# Patient Record
Sex: Male | Born: 1955 | Race: White | Hispanic: No | Smoking: Current every day smoker
Health system: Southern US, Community
[De-identification: ages and names within clinical notes are randomized; demographics above are authoritative.]

## PROBLEM LIST (undated history)

## (undated) DIAGNOSIS — I509 Heart failure, unspecified: Secondary | ICD-10-CM

## (undated) DIAGNOSIS — C801 Malignant (primary) neoplasm, unspecified: Secondary | ICD-10-CM

## (undated) DIAGNOSIS — M1990 Unspecified osteoarthritis, unspecified site: Secondary | ICD-10-CM

## (undated) DIAGNOSIS — F1410 Cocaine abuse, uncomplicated: Secondary | ICD-10-CM

## (undated) DIAGNOSIS — R630 Anorexia: Secondary | ICD-10-CM

## (undated) DIAGNOSIS — K7460 Unspecified cirrhosis of liver: Secondary | ICD-10-CM

## (undated) DIAGNOSIS — F1010 Alcohol abuse, uncomplicated: Secondary | ICD-10-CM

## (undated) DIAGNOSIS — R51 Headache: Secondary | ICD-10-CM

## (undated) DIAGNOSIS — Z720 Tobacco use: Secondary | ICD-10-CM

## (undated) DIAGNOSIS — F419 Anxiety disorder, unspecified: Secondary | ICD-10-CM

## (undated) DIAGNOSIS — J449 Chronic obstructive pulmonary disease, unspecified: Secondary | ICD-10-CM

## (undated) DIAGNOSIS — Z87898 Personal history of other specified conditions: Secondary | ICD-10-CM

## (undated) DIAGNOSIS — K759 Inflammatory liver disease, unspecified: Secondary | ICD-10-CM

## (undated) DIAGNOSIS — F1290 Cannabis use, unspecified, uncomplicated: Secondary | ICD-10-CM

## (undated) DIAGNOSIS — I2510 Atherosclerotic heart disease of native coronary artery without angina pectoris: Secondary | ICD-10-CM

## (undated) DIAGNOSIS — I219 Acute myocardial infarction, unspecified: Secondary | ICD-10-CM

## (undated) DIAGNOSIS — R296 Repeated falls: Secondary | ICD-10-CM

## (undated) DIAGNOSIS — J189 Pneumonia, unspecified organism: Secondary | ICD-10-CM

## (undated) DIAGNOSIS — IMO0001 Reserved for inherently not codable concepts without codable children: Secondary | ICD-10-CM

## (undated) DIAGNOSIS — R202 Paresthesia of skin: Secondary | ICD-10-CM

## (undated) DIAGNOSIS — R200 Anesthesia of skin: Secondary | ICD-10-CM

## (undated) DIAGNOSIS — H9319 Tinnitus, unspecified ear: Secondary | ICD-10-CM

## (undated) DIAGNOSIS — K219 Gastro-esophageal reflux disease without esophagitis: Secondary | ICD-10-CM

## (undated) DIAGNOSIS — R519 Headache, unspecified: Secondary | ICD-10-CM

## (undated) DIAGNOSIS — G629 Polyneuropathy, unspecified: Secondary | ICD-10-CM

## (undated) HISTORY — PX: CORONARY ANGIOPLASTY: SHX604

## (undated) HISTORY — PX: CARDIAC SURGERY: SHX584

---

## 2003-11-22 ENCOUNTER — Emergency Department: Payer: Self-pay | Admitting: Unknown Physician Specialty

## 2003-11-22 ENCOUNTER — Other Ambulatory Visit: Payer: Self-pay

## 2009-07-23 ENCOUNTER — Ambulatory Visit: Payer: Self-pay | Admitting: Cardiovascular Disease

## 2009-07-23 ENCOUNTER — Inpatient Hospital Stay: Payer: Self-pay | Admitting: Internal Medicine

## 2011-03-11 ENCOUNTER — Emergency Department: Payer: Self-pay | Admitting: *Deleted

## 2011-03-11 LAB — COMPREHENSIVE METABOLIC PANEL
Albumin: 3.5 g/dL (ref 3.4–5.0)
Anion Gap: 12 (ref 7–16)
BUN: 6 mg/dL — ABNORMAL LOW (ref 7–18)
Bilirubin,Total: 0.7 mg/dL (ref 0.2–1.0)
Chloride: 102 mmol/L (ref 98–107)
Creatinine: 0.68 mg/dL (ref 0.60–1.30)
EGFR (African American): >60
EGFR (Non-African Amer.): >60
Glucose: 123 mg/dL — ABNORMAL HIGH (ref 65–99)
Osmolality: 275 (ref 275–301)
Potassium: 3.5 mmol/L (ref 3.5–5.1)
SGOT(AST): 363 U/L — ABNORMAL HIGH (ref 15–37)
Sodium: 138 mmol/L (ref 136–145)
Total Protein: 8.9 g/dL — ABNORMAL HIGH (ref 6.4–8.2)

## 2011-03-11 LAB — ETHANOL
Ethanol %: 0.228 % — ABNORMAL HIGH (ref 0.000–0.080)
Ethanol: 228 mg/dL

## 2011-03-11 LAB — CBC
HCT: 45.3 % (ref 40.0–52.0)
MCV: 103 fL — ABNORMAL HIGH (ref 80–100)
RBC: 4.4 10*6/uL (ref 4.40–5.90)
RDW: 13.6 % (ref 11.5–14.5)
WBC: 7 10*3/uL (ref 3.8–10.6)

## 2012-06-14 ENCOUNTER — Emergency Department: Payer: Self-pay | Admitting: Emergency Medicine

## 2012-06-14 LAB — ETHANOL: Ethanol: <3 mg/dL

## 2012-06-14 LAB — URINALYSIS, COMPLETE
Bacteria: NONE SEEN
Blood: NEGATIVE
Glucose,UR: NEGATIVE mg/dL (ref 0–75)
Leukocyte Esterase: NEGATIVE
Ph: 7 (ref 4.5–8.0)
RBC,UR: NONE SEEN /HPF (ref 0–5)
Specific Gravity: 1.01 (ref 1.003–1.030)
Squamous Epithelial: <1
WBC UR: <1 /HPF (ref 0–5)

## 2012-06-14 LAB — COMPREHENSIVE METABOLIC PANEL
Alkaline Phosphatase: 104 U/L (ref 50–136)
Anion Gap: 7 (ref 7–16)
BUN: 9 mg/dL (ref 7–18)
Calcium, Total: 9.2 mg/dL (ref 8.5–10.1)
Chloride: 101 mmol/L (ref 98–107)
EGFR (African American): >60
EGFR (Non-African Amer.): >60
Osmolality: 269 (ref 275–301)
Potassium: 3.5 mmol/L (ref 3.5–5.1)
SGOT(AST): 142 U/L — ABNORMAL HIGH (ref 15–37)
SGPT (ALT): 135 U/L — ABNORMAL HIGH (ref 12–78)
Sodium: 135 mmol/L — ABNORMAL LOW (ref 136–145)
Total Protein: 8.6 g/dL — ABNORMAL HIGH (ref 6.4–8.2)

## 2012-06-14 LAB — CK TOTAL AND CKMB (NOT AT ARMC)
CK, Total: 58 U/L (ref 35–232)
CK-MB: 0.5 ng/mL (ref 0.5–3.6)

## 2012-06-14 LAB — DRUG SCREEN, URINE
Cannabinoid 50 Ng, Ur ~~LOC~~: POSITIVE (ref ?–50)
Cocaine Metabolite,Ur ~~LOC~~: NEGATIVE (ref ?–300)
Phencyclidine (PCP) Ur S: NEGATIVE (ref ?–25)

## 2012-06-14 LAB — CBC
HCT: 44.5 % (ref 40.0–52.0)
MCH: 33.9 pg (ref 26.0–34.0)
MCHC: 35.6 g/dL (ref 32.0–36.0)
MCV: 95 fL (ref 80–100)
RDW: 15.6 % — ABNORMAL HIGH (ref 11.5–14.5)
WBC: 7.7 10*3/uL (ref 3.8–10.6)

## 2012-09-02 ENCOUNTER — Inpatient Hospital Stay: Payer: Self-pay | Admitting: Psychiatry

## 2012-09-02 LAB — DRUG SCREEN, URINE
Barbiturates, Ur Screen: NEGATIVE (ref ?–200)
Benzodiazepine, Ur Scrn: POSITIVE (ref ?–200)
Cocaine Metabolite,Ur ~~LOC~~: NEGATIVE (ref ?–300)
MDMA (Ecstasy)Ur Screen: NEGATIVE (ref ?–500)
Methadone, Ur Screen: NEGATIVE (ref ?–300)
Phencyclidine (PCP) Ur S: NEGATIVE (ref ?–25)
Tricyclic, Ur Screen: NEGATIVE (ref ?–1000)

## 2012-09-02 LAB — URINALYSIS, COMPLETE
Bilirubin,UR: NEGATIVE
Blood: NEGATIVE
Glucose,UR: NEGATIVE mg/dL (ref 0–75)
Leukocyte Esterase: NEGATIVE
Nitrite: NEGATIVE
Ph: 6 (ref 4.5–8.0)
Protein: NEGATIVE
RBC,UR: NONE SEEN /HPF (ref 0–5)
Squamous Epithelial: <1
WBC UR: NONE SEEN /HPF (ref 0–5)

## 2012-09-02 LAB — CBC
HCT: 37.9 % — ABNORMAL LOW (ref 40.0–52.0)
HGB: 13.5 g/dL (ref 13.0–18.0)
MCHC: 35.6 g/dL (ref 32.0–36.0)
MCV: 94 fL (ref 80–100)
Platelet: 105 10*3/uL — ABNORMAL LOW (ref 150–440)
RBC: 4.04 10*6/uL — ABNORMAL LOW (ref 4.40–5.90)
RDW: 14.6 % — ABNORMAL HIGH (ref 11.5–14.5)
WBC: 3.6 10*3/uL — ABNORMAL LOW (ref 3.8–10.6)

## 2012-09-02 LAB — COMPREHENSIVE METABOLIC PANEL
Anion Gap: 3 — ABNORMAL LOW (ref 7–16)
BUN: 9 mg/dL (ref 7–18)
Bilirubin,Total: 0.4 mg/dL (ref 0.2–1.0)
Chloride: 97 mmol/L — ABNORMAL LOW (ref 98–107)
Creatinine: 0.88 mg/dL (ref 0.60–1.30)
EGFR (African American): >60
EGFR (Non-African Amer.): >60
Glucose: 104 mg/dL — ABNORMAL HIGH (ref 65–99)
Osmolality: 256 (ref 275–301)
SGPT (ALT): 138 U/L — ABNORMAL HIGH (ref 12–78)
Sodium: 128 mmol/L — ABNORMAL LOW (ref 136–145)
Total Protein: 8.2 g/dL (ref 6.4–8.2)

## 2012-09-02 LAB — ETHANOL: Ethanol: 61 mg/dL

## 2012-09-02 LAB — ACETAMINOPHEN LEVEL: Acetaminophen: <2 ug/mL

## 2012-09-02 LAB — TSH: Thyroid Stimulating Horm: 1.72 u[IU]/mL

## 2012-09-02 LAB — MAGNESIUM: Magnesium: 1.6 mg/dL — ABNORMAL LOW

## 2012-09-03 LAB — BEHAVIORAL MEDICINE 1 PANEL
Albumin: 3.4 g/dL (ref 3.4–5.0)
Alkaline Phosphatase: 104 U/L (ref 50–136)
Anion Gap: 4 — ABNORMAL LOW (ref 7–16)
BUN: 12 mg/dL (ref 7–18)
Basophil #: 0 10*3/uL (ref 0.0–0.1)
Basophil %: 0.7 %
Bilirubin,Total: 0.6 mg/dL (ref 0.2–1.0)
Calcium, Total: 9.2 mg/dL (ref 8.5–10.1)
Chloride: 102 mmol/L (ref 98–107)
Co2: 30 mmol/L (ref 21–32)
Creatinine: 0.98 mg/dL (ref 0.60–1.30)
EGFR (African American): >60
EGFR (Non-African Amer.): >60
Eosinophil #: 0.2 10*3/uL (ref 0.0–0.7)
Eosinophil %: 5.3 %
Glucose: 101 mg/dL — ABNORMAL HIGH (ref 65–99)
HCT: 40.3 % (ref 40.0–52.0)
HGB: 14.1 g/dL (ref 13.0–18.0)
Lymphocyte #: 1.3 10*3/uL (ref 1.0–3.6)
Lymphocyte %: 36.9 %
MCH: 32.6 pg (ref 26.0–34.0)
MCHC: 34.9 g/dL (ref 32.0–36.0)
MCV: 93 fL (ref 80–100)
Monocyte #: 0.3 x10 3/mm (ref 0.2–1.0)
Monocyte %: 8.5 %
Neutrophil #: 1.7 10*3/uL (ref 1.4–6.5)
Neutrophil %: 48.6 %
Osmolality: 272 (ref 275–301)
Platelet: 99 10*3/uL — ABNORMAL LOW (ref 150–440)
Potassium: 4.4 mmol/L (ref 3.5–5.1)
RBC: 4.32 10*6/uL — ABNORMAL LOW (ref 4.40–5.90)
RDW: 14.4 % (ref 11.5–14.5)
SGOT(AST): 100 U/L — ABNORMAL HIGH (ref 15–37)
SGPT (ALT): 119 U/L — ABNORMAL HIGH (ref 12–78)
Sodium: 136 mmol/L (ref 136–145)
Thyroid Stimulating Horm: 1.37 u[IU]/mL
Total Protein: 7.9 g/dL (ref 6.4–8.2)
WBC: 3.5 10*3/uL — ABNORMAL LOW (ref 3.8–10.6)

## 2013-12-08 ENCOUNTER — Emergency Department: Payer: Self-pay | Admitting: Internal Medicine

## 2014-04-13 DIAGNOSIS — B182 Chronic viral hepatitis C: Secondary | ICD-10-CM | POA: Insufficient documentation

## 2014-04-13 DIAGNOSIS — M79673 Pain in unspecified foot: Secondary | ICD-10-CM | POA: Insufficient documentation

## 2014-05-14 ENCOUNTER — Emergency Department
Admit: 2014-05-14 | Disposition: A | Discharge status: Home / Self Care | Payer: Self-pay | Admitting: Emergency Medicine

## 2014-05-14 LAB — URINALYSIS, COMPLETE
BACTERIA: NONE SEEN
BLOOD: NEGATIVE
Bilirubin,UR: NEGATIVE
Glucose,UR: NEGATIVE mg/dL (ref 0–75)
KETONE: NEGATIVE
LEUKOCYTE ESTERASE: NEGATIVE
Nitrite: NEGATIVE
PH: 6 (ref 4.5–8.0)
Protein: NEGATIVE
Specific Gravity: 1.019 (ref 1.003–1.030)
WBC UR: NONE SEEN /HPF (ref 0–5)

## 2014-05-14 LAB — COMPREHENSIVE METABOLIC PANEL
ALBUMIN: 3.9 g/dL
AST: 162 U/L — AB
Alkaline Phosphatase: 101 U/L
Anion Gap: 10 (ref 7–16)
BUN: 17 mg/dL
Bilirubin,Total: 0.7 mg/dL
CALCIUM: 9 mg/dL
Chloride: 99 mmol/L — ABNORMAL LOW
Co2: 26 mmol/L
Creatinine: 1.14 mg/dL
EGFR (African American): >60
Glucose: 109 mg/dL — ABNORMAL HIGH
POTASSIUM: 4.2 mmol/L
SGPT (ALT): 184 U/L — ABNORMAL HIGH
Sodium: 135 mmol/L
Total Protein: 8.8 g/dL — ABNORMAL HIGH

## 2014-05-14 LAB — CBC WITH DIFFERENTIAL/PLATELET
BASOS PCT: 0.4 %
Basophil #: 0 10*3/uL (ref 0.0–0.1)
EOS ABS: 0.2 10*3/uL (ref 0.0–0.7)
Eosinophil %: 2.9 %
HCT: 49.7 % (ref 40.0–52.0)
HGB: 16.4 g/dL (ref 13.0–18.0)
Lymphocyte #: 1.8 10*3/uL (ref 1.0–3.6)
Lymphocyte %: 30.6 %
MCH: 31.8 pg (ref 26.0–34.0)
MCHC: 33.1 g/dL (ref 32.0–36.0)
MCV: 96 fL (ref 80–100)
MONO ABS: 0.5 x10 3/mm (ref 0.2–1.0)
Monocyte %: 8.1 %
Neutrophil #: 3.4 10*3/uL (ref 1.4–6.5)
Neutrophil %: 58 %
Platelet: 111 10*3/uL — ABNORMAL LOW (ref 150–440)
RBC: 5.17 10*6/uL (ref 4.40–5.90)
RDW: 14.7 % — ABNORMAL HIGH (ref 11.5–14.5)
WBC: 5.8 10*3/uL (ref 3.8–10.6)

## 2014-05-14 LAB — LIPASE, BLOOD: Lipase: 36 U/L

## 2014-05-14 LAB — TROPONIN I: Troponin-I: <0.03 ng/mL
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
## 2014-08-02 ENCOUNTER — Other Ambulatory Visit: Payer: Self-pay | Admitting: Gastroenterology

## 2014-08-02 DIAGNOSIS — R634 Abnormal weight loss: Secondary | ICD-10-CM

## 2014-08-02 DIAGNOSIS — B182 Chronic viral hepatitis C: Secondary | ICD-10-CM

## 2014-08-02 DIAGNOSIS — R109 Unspecified abdominal pain: Secondary | ICD-10-CM

## 2014-08-08 ENCOUNTER — Ambulatory Visit
Admission: RE | Admit: 2014-08-08 | Discharge: 2014-08-08 | Disposition: A | Discharge status: Home / Self Care | Payer: Medicare Other | Source: Ambulatory Visit | Attending: Gastroenterology | Admitting: Gastroenterology

## 2014-08-08 DIAGNOSIS — R634 Abnormal weight loss: Secondary | ICD-10-CM

## 2014-08-08 DIAGNOSIS — R109 Unspecified abdominal pain: Secondary | ICD-10-CM | POA: Diagnosis present

## 2014-08-08 DIAGNOSIS — I714 Abdominal aortic aneurysm, without rupture: Secondary | ICD-10-CM | POA: Insufficient documentation

## 2014-08-08 DIAGNOSIS — B182 Chronic viral hepatitis C: Secondary | ICD-10-CM | POA: Insufficient documentation

## 2014-08-08 DIAGNOSIS — I2510 Atherosclerotic heart disease of native coronary artery without angina pectoris: Secondary | ICD-10-CM | POA: Diagnosis not present

## 2014-08-08 DIAGNOSIS — R161 Splenomegaly, not elsewhere classified: Secondary | ICD-10-CM | POA: Insufficient documentation

## 2014-08-08 DIAGNOSIS — K8020 Calculus of gallbladder without cholecystitis without obstruction: Secondary | ICD-10-CM | POA: Insufficient documentation

## 2014-08-08 MED ORDER — IOHEXOL 300 MG/ML  SOLN
100.0000 mL | Freq: Once | INTRAMUSCULAR | Status: AC | PRN
  Administered 2014-08-08: 100 mL via INTRAVENOUS

## 2014-08-09 ENCOUNTER — Ambulatory Visit: Payer: Medicare Other

## 2014-08-09 ENCOUNTER — Other Ambulatory Visit: Payer: Self-pay | Admitting: Gastroenterology

## 2014-08-09 DIAGNOSIS — K769 Liver disease, unspecified: Secondary | ICD-10-CM

## 2014-08-11 ENCOUNTER — Ambulatory Visit
Admission: RE | Admit: 2014-08-11 | Discharge: 2014-08-11 | Disposition: A | Discharge status: Home / Self Care | Payer: Medicare Other | Source: Ambulatory Visit | Attending: Gastroenterology | Admitting: Gastroenterology

## 2014-08-11 ENCOUNTER — Encounter: Payer: Self-pay | Admitting: Medical Oncology

## 2014-08-11 ENCOUNTER — Emergency Department
Admission: EM | Admit: 2014-08-11 | Discharge: 2014-08-11 | Disposition: A | Discharge status: Home / Self Care | Payer: Medicare Other | Attending: Emergency Medicine | Admitting: Emergency Medicine

## 2014-08-11 DIAGNOSIS — Z79899 Other long term (current) drug therapy: Secondary | ICD-10-CM | POA: Insufficient documentation

## 2014-08-11 DIAGNOSIS — Z720 Tobacco use: Secondary | ICD-10-CM | POA: Diagnosis not present

## 2014-08-11 DIAGNOSIS — Z09 Encounter for follow-up examination after completed treatment for conditions other than malignant neoplasm: Secondary | ICD-10-CM | POA: Insufficient documentation

## 2014-08-11 DIAGNOSIS — K769 Liver disease, unspecified: Secondary | ICD-10-CM | POA: Diagnosis present

## 2014-08-11 DIAGNOSIS — R161 Splenomegaly, not elsewhere classified: Secondary | ICD-10-CM | POA: Diagnosis not present

## 2014-08-11 DIAGNOSIS — L271 Localized skin eruption due to drugs and medicaments taken internally: Secondary | ICD-10-CM | POA: Diagnosis not present

## 2014-08-11 DIAGNOSIS — K8020 Calculus of gallbladder without cholecystitis without obstruction: Secondary | ICD-10-CM | POA: Insufficient documentation

## 2014-08-11 DIAGNOSIS — L500 Allergic urticaria: Secondary | ICD-10-CM | POA: Diagnosis not present

## 2014-08-11 DIAGNOSIS — R21 Rash and other nonspecific skin eruption: Secondary | ICD-10-CM | POA: Diagnosis present

## 2014-08-11 DIAGNOSIS — T508X5A Adverse effect of diagnostic agents, initial encounter: Secondary | ICD-10-CM | POA: Insufficient documentation

## 2014-08-11 HISTORY — DX: Polyneuropathy, unspecified: G62.9

## 2014-08-11 HISTORY — DX: Acute myocardial infarction, unspecified: I21.9

## 2014-08-11 MED ORDER — METHYLPREDNISOLONE SODIUM SUCC 125 MG IJ SOLR
INTRAMUSCULAR | Status: AC
  Administered 2014-08-11: 125 mg via INTRAVENOUS
  Filled 2014-08-11: qty 2

## 2014-08-11 MED ORDER — FAMOTIDINE IN NACL 20-0.9 MG/50ML-% IV SOLN
INTRAVENOUS | Status: AC
  Administered 2014-08-11: 20 mg via INTRAVENOUS
  Filled 2014-08-11: qty 50

## 2014-08-11 MED ORDER — METHYLPREDNISOLONE SODIUM SUCC 125 MG IJ SOLR
125.0000 mg | Freq: Once | INTRAMUSCULAR | Status: AC
  Administered 2014-08-11: 125 mg via INTRAVENOUS

## 2014-08-11 MED ORDER — DIPHENHYDRAMINE HCL 50 MG/ML IJ SOLN
50.0000 mg | Freq: Once | INTRAMUSCULAR | Status: AC
  Administered 2014-08-11: 50 mg via INTRAVENOUS

## 2014-08-11 MED ORDER — GADOBENATE DIMEGLUMINE 529 MG/ML IV SOLN
15.0000 mL | Freq: Once | INTRAVENOUS | Status: AC | PRN
  Administered 2014-08-11: 14 mL via INTRAVENOUS

## 2014-08-11 MED ORDER — DIPHENHYDRAMINE HCL 50 MG/ML IJ SOLN
INTRAMUSCULAR | Status: AC
  Administered 2014-08-11: 50 mg via INTRAVENOUS
  Filled 2014-08-11: qty 1

## 2014-08-11 MED ORDER — DIPHENHYDRAMINE HCL 25 MG PO TABS: 25.0000 mg | ORAL_TABLET | Freq: Four times a day (QID) | ORAL | Status: DC | PRN

## 2014-08-11 MED ORDER — FAMOTIDINE IN NACL 20-0.9 MG/50ML-% IV SOLN
20.0000 mg | Freq: Once | INTRAVENOUS | Status: AC
  Administered 2014-08-11: 20 mg via INTRAVENOUS

## 2014-08-11 NOTE — ED Notes (Signed)
Pt brought over from MRI dept where he was doing MRI of liver to follow up from CT where they seen a "spot" on his liver. Pt was given IV contrast and about 60 seconds after injection pt began to have itching and sob.

## 2014-08-11 NOTE — ED Provider Notes (Signed)
[facility] Emergency Department Provider Note  ____________________________________________  Time seen: 8:10 AM  I have reviewed the triage vital signs and the nursing notes.   HISTORY  Chief Complaint Allergic Reaction    HPI Phillip Warren is a 59 y.o. male is brought to the ED from MRI due to rashes and itching after a contrast enhanced MRI. He denies vomiting abdominal pain diarrhea chest pain shortness of breath or throat swelling.He reports prior contrast enhanced radiology without incident     Past Medical History  Diagnosis Date  . Neuropathy   . Myocardial infarction     There are no active problems to display for this patient.   Past Surgical History  Procedure Laterality Date  . Cardiac surgery      Current Outpatient Rx  Name  Route  Sig  Dispense  Refill  . gabapentin (NEURONTIN) 800 MG tablet   Oral   Take 1 tablet by mouth 3 (three) times daily.         . diphenhydrAMINE (BENADRYL) 25 MG tablet   Oral   Take 1 tablet (25 mg total) by mouth every 6 (six) hours as needed.   30 tablet   0     Allergies Multihance; Contrast media; Hydrocodone; and Vicodin  No family history on file.  Social History History  Substance Use Topics  . Smoking status: Current Every Day Smoker -- 1.00 packs/day    Types: Cigarettes  . Smokeless tobacco: Not on file  . Alcohol Use: Yes     Comment: Daily    Review of Systems  Constitutional: No fever or chills. No weight changes Eyes:No blurry vision or double vision.  ENT: No sore throat. Cardiovascular: No chest pain. Respiratory: No dyspnea or cough. Gastrointestinal: Negative for abdominal pain, vomiting and diarrhea.  No BRBPR or melena. Genitourinary: Negative for dysuria, urinary retention, bloody urine, or difficulty urinating. Musculoskeletal: Negative for back pain. No joint swelling or pain. Skin: Positive for itchy rash. Neurological: Negative for headaches,  focal weakness or numbness. Psychiatric:No anxiety or depression.   Endocrine:No hot/cold intolerance, changes in energy, or sleep difficulty.  10-point ROS otherwise negative.  ____________________________________________   PHYSICAL EXAM:  VITAL SIGNS: ED Triage Vitals  Enc Vitals Group     BP 08/11/14 0817 124/85 mmHg     Pulse Rate 08/11/14 0817 77     Resp 08/11/14 0817 19     Temp 08/11/14 0817 97.8 F (36.6 C)     Temp Source 08/11/14 0817 Oral     SpO2 08/11/14 0817 97 %     Weight 08/11/14 0817 148 lb (67.132 kg)     Height 08/11/14 0817 6' (1.829 m)     Head Cir --      Peak Flow --      Pain Score 08/11/14 0818 10     Pain Loc --      Pain Edu? --      Excl. in [place]? --      Constitutional: Alert and oriented. Well appearing and in no distress. Eyes: No scleral icterus. No conjunctival pallor. PERRL. EOMI ENT   Head: Normocephalic and atraumatic.   Nose: No congestion/rhinnorhea. No septal hematoma   Mouth/Throat: MMM, no pharyngeal erythema. No peritonsillar mass. No uvula shift.   Neck: No stridor. No SubQ emphysema. No meningismus. Hematological/Lymphatic/Immunilogical: No cervical lymphadenopathy. Cardiovascular: RRR. Normal and symmetric distal pulses are present in all extremities. No murmurs, rubs, or gallops. Respiratory: Normal respiratory effort without  tachypnea nor retractions. Breath sounds are clear and equal bilaterally. No wheezes/rales/rhonchi. Gastrointestinal: Soft and nontender. No distention. There is no CVA tenderness.  No rebound, rigidity, or guarding. Genitourinary: deferred Musculoskeletal: Nontender with normal range of motion in all extremities. No joint effusions.  No lower extremity tenderness.  No edema. Neurologic:   Normal speech and language.  CN 2-10 normal. Motor grossly intact. No pronator drift.  Normal gait. No gross focal neurologic deficits are appreciated.  Skin:  Skin is warm, dry and intact. Positive  urticarial rash on the left side of the face and left arm Psychiatric: Mood and affect are normal. Speech and behavior are normal. Patient exhibits appropriate insight and judgment.  ____________________________________________    LABS (pertinent positives/negatives) (all labs ordered are listed, but only abnormal results are displayed) Labs Reviewed - No data to display ____________________________________________   EKG    ____________________________________________    RADIOLOGY    ____________________________________________   PROCEDURES  ____________________________________________   INITIAL IMPRESSION / ASSESSMENT AND PLAN / ED COURSE  Pertinent labs & imaging results that were available during my care of the patient were reviewed by me and considered in my medical decision making (see chart for details).  Patient presents with hypersensitivity reaction related to recent MRI contrast infusion. No anaphylaxis at this time. Non-toxic, in no distress. We'll give steroids and antihistamines.  ----------------------------------------- 12:05 PM on 08/11/2014 -----------------------------------------  Remains hemodynamically stable in the ED. Improved with antihistamines. We'll discharge home on Benadryl.  ____________________________________________   FINAL CLINICAL IMPRESSION(S) / ED DIAGNOSES  Final diagnoses:  Allergic reaction to contrast dye, initial encounter      Carrie Mew, MD 08/11/14 1205

## 2014-08-11 NOTE — Discharge Instructions (Signed)
Rash °A rash is a change in the color or texture of your skin. There are many different types of rashes. You may have other problems that accompany your rash. °CAUSES  °· Infections. °· Allergic reactions. This can include allergies to pets or foods. °· Certain medicines. °· Exposure to certain chemicals, soaps, or cosmetics. °· Heat. °· Exposure to poisonous plants. °· Tumors, both cancerous and noncancerous. °SYMPTOMS  °· Redness. °· Scaly skin. °· Itchy skin. °· Dry or cracked skin. °· Bumps. °· Blisters. °· Pain. °DIAGNOSIS  °Your caregiver may do a physical exam to determine what type of rash you have. A skin sample (biopsy) may be taken and examined under a microscope. °TREATMENT  °Treatment depends on the type of rash you have. Your caregiver may prescribe certain medicines. For serious conditions, you may need to see a skin doctor (dermatologist). °HOME CARE INSTRUCTIONS  °· Avoid the substance that caused your rash. °· Do not scratch your rash. This can cause infection. °· You may take cool baths to help stop itching. °· Only take over-the-counter or prescription medicines as directed by your caregiver. °· Keep all follow-up appointments as directed by your caregiver. °SEEK IMMEDIATE MEDICAL CARE IF: °· You have increasing pain, swelling, or redness. °· You have a fever. °· You have new or severe symptoms. °· You have body aches, diarrhea, or vomiting. °· Your rash is not better after 3 days. °MAKE SURE YOU: °· Understand these instructions. °· Will watch your condition. °· Will get help right away if you are not doing well or get worse. °Document Released: 01/03/2002 Document Revised: 04/07/2011 Document Reviewed: 10/28/2010 °ExitCare® Patient Information ©2015 [employer]. This information is not intended to replace advice given to you by your health care provider. Make sure you discuss any questions you have with your health care provider. ° °Drug Allergy °Allergic reactions to medicines are common.  Some allergic reactions are mild. A delayed type of drug allergy that occurs 1 week or more after exposure to a medicine or vaccine is called serum sickness. A life-threatening, sudden (acute) allergic reaction that involves the whole body is called anaphylaxis. °CAUSES  °"True" drug allergies occur when there is an allergic reaction to a medicine. This is caused by overactivity of the immune system. First, the body becomes sensitized. The immune system is triggered by your first exposure to the medicine. Following this first exposure, future exposure to the same medicine may be life-threatening. °Almost any medicine can cause an allergic reaction. Common ones are: °· Penicillin. °· Sulfonamides (sulfa drugs). °· Local anesthetics. °· X-ray dyes that contain iodine. °SYMPTOMS  °Common symptoms of a minor allergic reaction are: °· Swelling around the mouth. °· An itchy red rash or hives. °· Vomiting or diarrhea. °Anaphylaxis can cause swelling of the mouth and throat. This makes it difficult to breathe and swallow. Severe reactions can be fatal within seconds, even after exposure to only a trace amount of the drug that causes the reaction. °HOME CARE INSTRUCTIONS  °· If you are unsure of what caused your reaction, keep a diary of foods and medicines used. Include the symptoms that followed. Avoid anything that causes reactions. °· You may want to follow up with an allergy specialist after the reaction has cleared in order to be tested to confirm the allergy. It is important to confirm that your reaction is an allergy, not just a side effect to the medicine. If you have a true allergy to a medicine, this may prevent   that medicine and related medicines from being given to you when you are very ill. °· If you have hives or a rash: °¨ Take medicines as directed by your caregiver. °¨ You may use an over-the-counter antihistamine (diphenhydramine) as needed. °¨ Apply cold compresses to the skin or take baths in cool water.  Avoid hot baths or showers. °· If you are severely allergic: °¨ Continuous observation after a severe reaction may be needed. Hospitalization is often required. °¨ Wear a medical alert bracelet or necklace stating your allergy. °¨ You and your family must learn how to use an anaphylaxis kit or give an epinephrine injection to temporarily treat an emergency allergic reaction. If you have had a severe reaction, always carry your epinephrine injection or anaphylaxis kit with you. This can be lifesaving if you have a severe reaction. °· Do not drive or perform tasks after treatment until the medicines used to treat your reaction have worn off, or until your caregiver says it is okay. °SEEK MEDICAL CARE IF:  °· You think you had an allergic reaction. Symptoms usually start within 30 minutes after exposure. °· Symptoms are getting worse rather than better. °· You develop new symptoms. °· The symptoms that brought you to your caregiver return. °SEEK IMMEDIATE MEDICAL CARE IF:  °· You have swelling of the mouth, difficulty breathing, or wheezing. °· You have a tight feeling in your chest or throat. °· You develop hives, swelling, or itching all over your body. °· You develop severe vomiting or diarrhea. °· You feel faint or pass out. °This is an emergency. Use your epinephrine injection or anaphylaxis kit as you have been instructed. Call for emergency medical help. Even if you improve after the injection, you need to be examined at a hospital emergency department. °MAKE SURE YOU:  °· Understand these instructions. °· Will watch your condition. °· Will get help right away if you are not doing well or get worse. °Document Released: 01/13/2005 Document Revised: 04/07/2011 Document Reviewed: 06/19/2010 °ExitCare® Patient Information ©2015 [employer]. This information is not intended to replace advice given to you by your health care provider. Make sure you discuss any questions you have with your health care provider. ° °

## 2014-08-24 ENCOUNTER — Inpatient Hospital Stay: Payer: Medicare Other

## 2014-08-24 ENCOUNTER — Inpatient Hospital Stay: Payer: Medicare Other | Attending: Oncology | Admitting: Oncology

## 2014-08-24 VITALS — BP 124/79 | HR 76 | Temp 96.4°F | Wt 145.5 lb

## 2014-08-24 DIAGNOSIS — R05 Cough: Secondary | ICD-10-CM

## 2014-08-24 DIAGNOSIS — R059 Cough, unspecified: Secondary | ICD-10-CM

## 2014-08-24 DIAGNOSIS — I2510 Atherosclerotic heart disease of native coronary artery without angina pectoris: Secondary | ICD-10-CM | POA: Diagnosis not present

## 2014-08-24 DIAGNOSIS — C787 Secondary malignant neoplasm of liver and intrahepatic bile duct: Secondary | ICD-10-CM

## 2014-08-24 DIAGNOSIS — C220 Liver cell carcinoma: Secondary | ICD-10-CM

## 2014-08-24 DIAGNOSIS — F17210 Nicotine dependence, cigarettes, uncomplicated: Secondary | ICD-10-CM | POA: Diagnosis not present

## 2014-08-24 DIAGNOSIS — I252 Old myocardial infarction: Secondary | ICD-10-CM | POA: Insufficient documentation

## 2014-08-24 DIAGNOSIS — G629 Polyneuropathy, unspecified: Secondary | ICD-10-CM | POA: Diagnosis not present

## 2014-08-24 DIAGNOSIS — R634 Abnormal weight loss: Secondary | ICD-10-CM | POA: Insufficient documentation

## 2014-08-24 DIAGNOSIS — R109 Unspecified abdominal pain: Secondary | ICD-10-CM | POA: Insufficient documentation

## 2014-08-24 DIAGNOSIS — K769 Liver disease, unspecified: Secondary | ICD-10-CM | POA: Diagnosis present

## 2014-08-24 DIAGNOSIS — C189 Malignant neoplasm of colon, unspecified: Secondary | ICD-10-CM

## 2014-08-24 DIAGNOSIS — R772 Abnormality of alphafetoprotein: Secondary | ICD-10-CM | POA: Insufficient documentation

## 2014-08-24 DIAGNOSIS — F1020 Alcohol dependence, uncomplicated: Secondary | ICD-10-CM | POA: Diagnosis not present

## 2014-08-24 DIAGNOSIS — B1920 Unspecified viral hepatitis C without hepatic coma: Secondary | ICD-10-CM | POA: Insufficient documentation

## 2014-08-24 LAB — CBC WITH DIFFERENTIAL/PLATELET
BASOS ABS: 0 10*3/uL (ref 0–0.1)
Basophils Relative: 1 %
Eosinophils Absolute: 0.2 10*3/uL (ref 0–0.7)
Eosinophils Relative: 4 %
HCT: 45.5 % (ref 40.0–52.0)
Hemoglobin: 15.2 g/dL (ref 13.0–18.0)
LYMPHS ABS: 1.7 10*3/uL (ref 1.0–3.6)
LYMPHS PCT: 38 %
MCH: 32.1 pg (ref 26.0–34.0)
MCHC: 33.3 g/dL (ref 32.0–36.0)
MCV: 96.4 fL (ref 80.0–100.0)
Monocytes Absolute: 0.3 10*3/uL (ref 0.2–1.0)
Monocytes Relative: 7 %
Neutro Abs: 2.3 10*3/uL (ref 1.4–6.5)
Neutrophils Relative %: 50 %
PLATELETS: 164 10*3/uL (ref 150–440)
RBC: 4.72 MIL/uL (ref 4.40–5.90)
RDW: 14.9 % — ABNORMAL HIGH (ref 11.5–14.5)
WBC: 4.6 10*3/uL (ref 3.8–10.6)

## 2014-08-24 LAB — COMPREHENSIVE METABOLIC PANEL
ALBUMIN: 4.1 g/dL (ref 3.5–5.0)
ALT: 61 U/L (ref 17–63)
AST: 67 U/L — AB (ref 15–41)
Alkaline Phosphatase: 80 U/L (ref 38–126)
Anion gap: 8 (ref 5–15)
BILIRUBIN TOTAL: 0.8 mg/dL (ref 0.3–1.2)
BUN: 10 mg/dL (ref 6–20)
CHLORIDE: 99 mmol/L — AB (ref 101–111)
CO2: 24 mmol/L (ref 22–32)
Calcium: 8.5 mg/dL — ABNORMAL LOW (ref 8.9–10.3)
Creatinine, Ser: 0.99 mg/dL (ref 0.61–1.24)
GFR calc Af Amer: >60 mL/min (ref >60)
GFR calc non Af Amer: >60 mL/min (ref >60)
Glucose, Bld: 82 mg/dL (ref 65–99)
Potassium: 4.4 mmol/L (ref 3.5–5.1)
Sodium: 131 mmol/L — ABNORMAL LOW (ref 135–145)
TOTAL PROTEIN: 8.9 g/dL — AB (ref 6.5–8.1)

## 2014-08-24 NOTE — Progress Notes (Signed)
Patient does not have living will.  Declined information.  Patient currently smokes a pack a day.

## 2014-08-25 ENCOUNTER — Telehealth: Payer: Self-pay | Admitting: *Deleted

## 2014-08-25 NOTE — Telephone Encounter (Signed)
Medications for dye prep called into pharmacy. Pt instructed to pick up and start taking the night before CT scan and to follow instructions on pill bottles. Pt verbalized understanding.

## 2014-08-26 ENCOUNTER — Encounter: Payer: Self-pay | Admitting: Oncology

## 2014-08-26 DIAGNOSIS — B1920 Unspecified viral hepatitis C without hepatic coma: Secondary | ICD-10-CM | POA: Insufficient documentation

## 2014-08-26 DIAGNOSIS — I2510 Atherosclerotic heart disease of native coronary artery without angina pectoris: Secondary | ICD-10-CM | POA: Insufficient documentation

## 2014-08-26 DIAGNOSIS — D649 Anemia, unspecified: Secondary | ICD-10-CM | POA: Insufficient documentation

## 2014-08-26 DIAGNOSIS — B1910 Unspecified viral hepatitis B without hepatic coma: Secondary | ICD-10-CM | POA: Insufficient documentation

## 2014-08-26 DIAGNOSIS — K7460 Unspecified cirrhosis of liver: Secondary | ICD-10-CM | POA: Insufficient documentation

## 2014-08-26 NOTE — Progress Notes (Signed)
[place] @ [facility] Telephone:(336) [phone]  Fax:(336) [place] OB: 1955-12-12  MR#: [id number]  BUL#:[id number]  Patient Care Team: Glendon Axe, MD as PCP - General (Internal Medicine)  CHIEF COMPLAINT:  Abnormal CT scan and MRI scan suggestive of liver lesions either primary hepatocellular cancer versus metastases.  Acute chronic alcoholism Hepatitis C Chronic tobacco abuse  VISIT DIAGNOSIS:     ICD-9-CM ICD-10-CM   1. Liver lesion 573.8 K76.89 pregabalin (LYRICA) 50 MG capsule     nitroGLYCERIN (NITROSTAT) 0.4 MG SL tablet     CBC with Differential     Comprehensive metabolic panel     Cancer antigen 19-9     CEA     AFP tumor marker     HIV antibody     CT Chest W Contrast  2. Cough 786.2 R05 pregabalin (LYRICA) 50 MG capsule     nitroGLYCERIN (NITROSTAT) 0.4 MG SL tablet     CBC with Differential     Comprehensive metabolic panel     Cancer antigen 19-9     CEA     AFP tumor marker     HIV antibody     CT Chest W Contrast  3. HCC (hepatocellular carcinoma) 155.0 C22.0 pregabalin (LYRICA) 50 MG capsule     nitroGLYCERIN (NITROSTAT) 0.4 MG SL tablet     CBC with Differential     Comprehensive metabolic panel     Cancer antigen 19-9     CEA     AFP tumor marker     HIV antibody     CT Chest W Contrast  4. Liver metastasis 197.7 C78.7 pregabalin (LYRICA) 50 MG capsule    C80.1 nitroGLYCERIN (NITROSTAT) 0.4 MG SL tablet     CBC with Differential     Comprehensive metabolic panel     Cancer antigen 19-9     CEA     AFP tumor marker     HIV antibody     CT Chest W Contrast  5. Colon cancer 153.9 C18.9 CEA      No history exists.    Oncology Flowsheet 08/11/2014  methylPREDNISolone sodium succinate 125 mg/2 mL (SOLU-MEDROL) IV 125 mg    INTERVAL HISTORY\ 59 year old gentleman who was referred to me by primary care physician and gastroenterologist because of increasing abdominal discomfort.  Patient has burning  and stinging in lower extremity has a chronic neuropathy.  Other problem includes hepatitis C, chronic acute and chronic alcoholism and ongoing smoker. Significant weight loss. Patient is being seen by gastroenterology is CT scan of abdomen and MRI scan of abdomen was suggestive of either primary hepatocellular cancer versus metastases to the liver.  Colonoscopy is being planned.  REVIEW OF SYSTEMS:   GENERAL:  Patient is thin  and  Lean has lost significant weight PERFORMANCE STATUS (ECOG): 01 HEENT:  No visual changes, runny nose, sore throat, mouth sores or tenderness. Lungs: No shortness of breath or cough.  No hemoptysis. Cardiac:  No chest pain, palpitations, orthopnea, or PND. GI: Abdominal discomfort.  Persistent.  No rectal bleeding.  No hematemesis.  History of hepatitis C GU:  No urgency, frequency, dysuria, or hematuria. Musculoskeletal:  No back pain.  No joint pain.  No muscle tenderness. Extremities:  No pain or swelling. Skin:  No rashes or skin changes. Neuro:  Chronic sensory neuropathy Endocrine:  No diabetes, thyroid issues, hot flashes or night sweats. Psych:  No mood changes, depression or anxiety. Pain:  No  focal pain. Review of systems:  All other systems reviewed and found to be negative.  As per HPI. Otherwise, a complete review of systems is negatve.  PAST MEDICAL HISTORY: Past Medical History  Diagnosis Date  . Neuropathy   . Myocardial infarction    Coronary artery disease is a 35.  Cocaine and marijuana use. PAST SURGICAL HISTORY: Past Surgical History  Procedure Laterality Date  . Cardiac surgery      FAMILY HISTORY There is no significant family history of breast cancer, ovarian cancer, colon cancer history of lung cancer  His review of hypertension, cerebrovascular accident, colon cancer in the family.        ADVANCED DIRECTIVES:   Patient does have advance healthcare directive, Patient   does not desire to make any changes HEALTH  MAINTENANCE: History  Substance Use Topics  . Smoking status: Current Every Day Smoker -- 1.00 packs/day    Types: Cigarettes  . Smokeless tobacco: Not on file  . Alcohol Use: Yes     Comment: Daily      Allergies  Allergen Reactions  . Multihance [Gadobenate] Hives and Shortness Of Breath  . Contrast Media [Iodinated Diagnostic Agents] Itching  . Hydrocodone     Other reaction(s): UNKNOWN  . Vicodin [Hydrocodone-Acetaminophen] Nausea Only and Nausea And Vomiting    Vomiting     Current Outpatient Prescriptions  Medication Sig Dispense Refill  . gabapentin (NEURONTIN) 800 MG tablet Take 1 tablet by mouth 3 (three) times daily.    . nitroGLYCERIN (NITROSTAT) 0.4 MG SL tablet Place 0.4 mg under the tongue.    . pregabalin (LYRICA) 50 MG capsule Take by mouth.    . diphenhydrAMINE (BENADRYL) 25 MG tablet Take 1 tablet (25 mg total) by mouth every 6 (six) hours as needed. (Patient not taking: Reported on 08/24/2014) 30 tablet 0   No current facility-administered medications for this visit.    OBJECTIVE: PHYSICAL EXAM: Gen. status: Patient is alert and oriented not any acute distress. Somewhat cachectic.  He has lost significant weight Head exam was generally normal. There was no scleral icterus or corneal arcus. Mucous membranes were moist. Lymphatic system: Supraclavicular, cervical, axillary, inguinal lymph nodes are not palpable Abdomen soft.  Tenderness in right upper quadrant.  Liver may be palpable a few fingerbreadths below costal margin . no ascites. Lower extremity no edema. Examination of the skin revealed no evidence of significant rashes, suspicious appearing nevi or other concerning lesions. Neurological system: Chronic sensory neuropathy  Examination of the chest was unremarkable. There were no bony deformities, no asymmetry, and no other abnormalities.. Cardiac exam revealed the PMI to be normally situated and sized. The rhythm was regular and no extrasystoles  were noted during several minutes of auscultation. The first and second heart sounds were normal and physiologic splitting of the second heart sound was noted. There were no murmurs, rubs, clicks, or gallops.  Filed Vitals:   08/24/14 1119  BP: 124/79  Pulse: 76  Temp: 96.4 F (35.8 C)     Body mass index is 19.73 kg/(m^2).    ECOG FS:1 - Symptomatic but completely ambulatory  LAB RESULTS:  Appointment on 08/24/2014  Component Date Value Ref Range Status  . WBC 08/24/2014 4.6  3.8 - 10.6 K/uL Final  . RBC 08/24/2014 4.72  4.40 - 5.90 MIL/uL Final  . Hemoglobin 08/24/2014 15.2  13.0 - 18.0 g/dL Final  . HCT 08/24/2014 45.5  40.0 - 52.0 % Final  . MCV 08/24/2014 96.4  80.0 -  100.0 fL Final  . MCH 08/24/2014 32.1  26.0 - 34.0 pg Final  . MCHC 08/24/2014 33.3  32.0 - 36.0 g/dL Final  . RDW 08/24/2014 14.9* 11.5 - 14.5 % Final  . Platelets 08/24/2014 164  150 - 440 K/uL Final  . Neutrophils Relative % 08/24/2014 50   Final  . Neutro Abs 08/24/2014 2.3  1.4 - 6.5 K/uL Final  . Lymphocytes Relative 08/24/2014 38   Final  . Lymphs Abs 08/24/2014 1.7  1.0 - 3.6 K/uL Final  . Monocytes Relative 08/24/2014 7   Final  . Monocytes Absolute 08/24/2014 0.3  0.2 - 1.0 K/uL Final  . Eosinophils Relative 08/24/2014 4   Final  . Eosinophils Absolute 08/24/2014 0.2  0 - 0.7 K/uL Final  . Basophils Relative 08/24/2014 1   Final  . Basophils Absolute 08/24/2014 0.0  0 - 0.1 K/uL Final  . Sodium 08/24/2014 131* 135 - 145 mmol/L Final  . Potassium 08/24/2014 4.4  3.5 - 5.1 mmol/L Final  . Chloride 08/24/2014 99* 101 - 111 mmol/L Final  . CO2 08/24/2014 24  22 - 32 mmol/L Final  . Glucose, Bld 08/24/2014 82  65 - 99 mg/dL Final  . BUN 08/24/2014 10  6 - 20 mg/dL Final  . Creatinine, Ser 08/24/2014 0.99  0.61 - 1.24 mg/dL Final  . Calcium 08/24/2014 8.5* 8.9 - 10.3 mg/dL Final  . Total Protein 08/24/2014 8.9* 6.5 - 8.1 g/dL Final  . Albumin 08/24/2014 4.1  3.5 - 5.0 g/dL Final  . AST 08/24/2014 67*  15 - 41 U/L Final  . ALT 08/24/2014 61  17 - 63 U/L Final  . Alkaline Phosphatase 08/24/2014 80  38 - 126 U/L Final  . Total Bilirubin 08/24/2014 0.8  0.3 - 1.2 mg/dL Final  . GFR calc non Af Amer 08/24/2014 >60  >60 mL/min Final  . GFR calc Af Amer 08/24/2014 >60  >60 mL/min Final   Comment: (NOTE) The eGFR has been calculated using the CKD EPI equation. This calculation has not been validated in all clinical situations. eGFR's persistently <60 mL/min signify possible Chronic Kidney Disease.   . Anion gap 08/24/2014 8  5 - 15 Final  . CA 19-9 08/24/2014 22  0 - 35 U/mL Final   Comment: (NOTE) Roche ECLIA methodology Performed At: [facility] [place], [place] [id number] Lindon Romp MD [id number]   . CEA 08/24/2014 2.5  0.0 - 4.7 ng/mL Final   Comment: (NOTE)       Roche ECLIA methodology       Nonsmokers  <3.9                                     Smokers     <5.6 Performed At: [facility] [place], [place] [id number] Lindon Romp MD [id number]   . AFP-Tumor Marker 08/24/2014 482.4* 0.0 - 8.3 ng/mL Final   Comment: (NOTE) Roche ECLIA methodology Performed At: [facility] [place], [place] [id number] Lindon Romp MD [id number]   . HIV Screen 4th Generation wRfx 08/24/2014 Non Reactive  Non Reactive Final   Comment: (NOTE) Performed At: [facility] [place], [place] [id number] Lindon Romp MD [id number]      STUDIES: Ct Abdomen Pelvis W Contrast  08/08/2014   CLINICAL DATA:  59 year old male with chronic hepatitis C. Diffuse abdominal  pain with nausea and vomiting for the past 3 months.  EXAM: CT ABDOMEN AND PELVIS WITH CONTRAST  TECHNIQUE: Multidetector CT imaging of the abdomen and pelvis was performed using the standard protocol following bolus administration of intravenous contrast.  CONTRAST:  185m OMNIPAQUE IOHEXOL 300 MG/ML  SOLN  COMPARISON:  CT  the abdomen and pelvis 05/14/2014.  FINDINGS: Lower chest: Borderline enlarged juxta pericardiac lymph node. Atherosclerotic calcifications in the right coronary artery.  Hepatobiliary: Slight nodular contour of the liver, indicative of underlying cirrhosis. In segment 8 of the liver (image 10 of series 2) there is a new 2.1 x 1.8 cm hypervascular lesion which demonstrates washout on delayed phase images, highly concerning for hepatocellular carcinoma. No other discrete hepatic lesion is confidently identified on today's examination. No intra or extrahepatic biliary ductal dilatation. Numerous small calcified gallstones within the lumen of the gallbladder. No current findings to suggest an acute cholecystitis at this time.  Pancreas: No pancreatic mass. No pancreatic ductal dilatation. No pancreatic or peripancreatic fluid or inflammatory changes.  Spleen: Spleen is mildly enlarged measuring 10.6 x 6.0 x 17.6 cm (estimated splenic volume of 560 mL). Tiny 11 mm well-defined low-attenuation lesion in the medial aspect of the most cephalad portion of the spleen is stable compared to prior studies, favored to represent a small cyst.  Adrenals/Urinary Tract: Bilateral adrenal glands and bilateral kidneys are normal in appearance. No hydroureteronephrosis. Urinary bladder is normal in appearance.  Stomach/Bowel: The appearance of the stomach is normal. No pathologic dilatation of small bowel or colon. Normal appendix.  Vascular/Lymphatic: Portal vein is mildly dilated measuring 16 mm in the porta hepatis. Extensive atherosclerosis throughout the abdominal and pelvic vasculature, including fusiform aneurysmal dilatation of the infrarenal abdominal aorta which measures up to 3.0 x 3.2 cm. Prominent portacaval lymph nodes which are mildly enlarged measuring up to 1.5 cm. Hepatoduodenal ligament lymph nodes are also mildly enlarged measuring up to 1.4 cm.  Reproductive: Prostate gland and seminal vesicles are normal in  appearance.  Other: No significant volume of ascites.  No pneumoperitoneum.  Musculoskeletal: There are no aggressive appearing lytic or blastic lesions noted in the visualized portions of the skeleton.  IMPRESSION: 1. Stigmata of cirrhosis likely with portal hypertension, now with a new hypervascular hepatic lesion in segment 8 of the liver highly concerning for hepatocellular carcinoma. Further evaluation with MRI with and without IV gadolinium (preferably Eovist) is recommended at this time to better evaluate this finding, and confirm that this is a solitary lesion (i.e., exclude multicentric disease). 2. Mild splenomegaly. 3. Cholelithiasis without evidence of acute cholecystitis at this time. 4. Extensive atherosclerosis, including right coronary artery disease and infrarenal abdominal aortic aneurysm measuring up to 3.0 x 3.2 cm. Please note that although the presence of coronary artery calcium documents the presence of coronary artery disease, the severity of this disease and any potential stenosis cannot be assessed on this non-gated CT examination. Assessment for potential risk factor modification, dietary therapy or pharmacologic therapy may be warranted, if clinically indicated. Additionally, for followup of the abdominal aortic aneurysm, followup by ultrasound is recommended in 3 years. This recommendation follows ACR consensus guidelines: White Paper of the ACR Incidental Findings Committee II on Vascular Findings. J Am Coll Radiol 2013; 10:789-794 These results will be called to the ordering clinician or representative by the Radiologist Assistant, and communication documented in the PACS or zVision Dashboard.   Electronically Signed   By: DVinnie LangtonM.D.   On: 08/08/2014 09:08  Mr Liver W Wo Contrast  08/11/2014   CLINICAL DATA:  Evaluate liver lesion on CT  EXAM: MRI ABDOMEN WITHOUT AND WITH CONTRAST  TECHNIQUE: Multiplanar multisequence MR imaging of the abdomen was performed both before  and after the administration of intravenous contrast.  CONTRAST:  14 mL Multihance IV  Note: The patient had a contrast reaction to Multihance (facial hives/pruritus follow by shortness of breath). He was evaluated by Dr. Register and sent to the ED.  COMPARISON:  CT abdomen pelvis dated 08/08/2014  FINDINGS: Lower chest:  Lung bases are clear.  Hepatobiliary: 2.0 x 1.6 cm lesion in segment 8 (series 10/image 18) which demonstrates early arterial enhancement, central portal venous washout, and a pseudocapsule (series 11/image 17), all of which are characteristic of metastatic carcinoma in the setting of background cirrhosis. These findings meet AASLD imaging criteria for diagnosis.  No additional lesions are seen.  Layering gallstones (series 3/ image 13). No associated inflammatory changes. No intrahepatic or extrahepatic ductal dilatation.  Pancreas: Within normal limits.  Spleen: Splenomegaly, measuring 17.4 cm in craniocaudal dimension.  Adrenals/Urinary Tract: Adrenal glands are within normal limits.  Kidneys are within normal limits.  No hydronephrosis.  Stomach/Bowel: Stomach and visualized bowel are unremarkable.  Vascular/Lymphatic: No evidence abdominal aortic aneurysm.  Small upper abdominal lymph nodes, including a 14 mm portacaval node, likely reactive.  Other: No abdominal ascites  Musculoskeletal: No focal osseous lesions.  IMPRESSION: 2.0 cm enhancing lesion in segment 8 of the liver which meets AASLD imaging criteria for diagnosis of HCC.  Cholelithiasis, without associated inflammatory changes.  Splenomegaly.  Intravenous contrast reaction to Multihance. Patient was sent to the ED for further evaluation.   Electronically Signed   By: Julian Hy M.D.   On: 08/11/2014 09:52    ASSESSMENT:  Normal liver lesions with normal CEA and CA 19-9 and high out for fetoprotein Radiological characteristic may suggest hepatocellular cancer Patient may not be a candidate for surgical resection but  radiofrequency ablation may be considered. Case will be discussed in tumor conference and I would discuss that with the radiologist 2.  Await colonoscopy result 3.  HIV is nonreactive 4.  Patient has hepatitis C. Treatment being planned by gastroenterologist  PLAN:   Case discussion in tumor conference Radiofrequency ablation versus surgical resection Reevaluate patient after her radiofrequency ablation Lab data including MRI scan and CT scan has been reviewed independently The patient was explained the nature of disease and expected line of treatment including all the options of resection versus radiofrequency ablation  Patient expressed understanding and was in agreement with this plan. He also understands that He can call clinic at any time with any questions, concerns, or complaints.    No matching staging information was found for the patient.  Forest Gleason, MD   08/26/2014 5:05 PM

## 2014-08-28 LAB — CANCER ANTIGEN 19-9: CA 19-9: 22 U/mL (ref 0–35)

## 2014-08-28 LAB — AFP TUMOR MARKER: AFP-Tumor Marker: 482.4 ng/mL — ABNORMAL HIGH (ref 0.0–8.3)

## 2014-08-28 LAB — CEA: CEA: 2.5 ng/mL (ref 0.0–4.7)

## 2014-08-28 LAB — HIV ANTIBODY (ROUTINE TESTING W REFLEX): HIV Screen 4th Generation wRfx: NONREACTIVE

## 2014-08-30 ENCOUNTER — Encounter: Payer: Self-pay | Admitting: *Deleted

## 2014-08-30 ENCOUNTER — Ambulatory Visit
Admission: RE | Admit: 2014-08-30 | Discharge: 2014-08-30 | Disposition: A | Discharge status: Home / Self Care | Payer: Medicare Other | Source: Ambulatory Visit | Attending: Oncology | Admitting: Oncology

## 2014-08-30 DIAGNOSIS — R05 Cough: Secondary | ICD-10-CM | POA: Insufficient documentation

## 2014-08-30 DIAGNOSIS — R918 Other nonspecific abnormal finding of lung field: Secondary | ICD-10-CM | POA: Insufficient documentation

## 2014-08-30 DIAGNOSIS — K7689 Other specified diseases of liver: Secondary | ICD-10-CM | POA: Insufficient documentation

## 2014-08-30 DIAGNOSIS — R059 Cough, unspecified: Secondary | ICD-10-CM

## 2014-08-30 DIAGNOSIS — K769 Liver disease, unspecified: Secondary | ICD-10-CM

## 2014-08-30 DIAGNOSIS — C220 Liver cell carcinoma: Secondary | ICD-10-CM

## 2014-08-30 DIAGNOSIS — C787 Secondary malignant neoplasm of liver and intrahepatic bile duct: Secondary | ICD-10-CM

## 2014-08-30 HISTORY — DX: Heart failure, unspecified: I50.9

## 2014-08-30 HISTORY — DX: Malignant (primary) neoplasm, unspecified: C80.1

## 2014-08-30 MED ORDER — IOHEXOL 300 MG/ML  SOLN
75.0000 mL | Freq: Once | INTRAMUSCULAR | Status: AC | PRN
  Administered 2014-08-30: 75 mL via INTRAVENOUS

## 2014-08-31 ENCOUNTER — Encounter
Admission: RE | Disposition: A | Discharge status: Home / Self Care | Payer: Self-pay | Source: Ambulatory Visit | Attending: Gastroenterology

## 2014-08-31 ENCOUNTER — Ambulatory Visit
Admission: RE | Admit: 2014-08-31 | Discharge: 2014-08-31 | Disposition: A | Discharge status: Home / Self Care | Payer: Medicare Other | Source: Ambulatory Visit | Attending: Gastroenterology | Admitting: Gastroenterology

## 2014-08-31 ENCOUNTER — Ambulatory Visit: Payer: Medicare Other | Admitting: Anesthesiology

## 2014-08-31 ENCOUNTER — Encounter: Payer: Self-pay | Admitting: *Deleted

## 2014-08-31 DIAGNOSIS — F1290 Cannabis use, unspecified, uncomplicated: Secondary | ICD-10-CM | POA: Diagnosis not present

## 2014-08-31 DIAGNOSIS — I2510 Atherosclerotic heart disease of native coronary artery without angina pectoris: Secondary | ICD-10-CM | POA: Insufficient documentation

## 2014-08-31 DIAGNOSIS — F1410 Cocaine abuse, uncomplicated: Secondary | ICD-10-CM | POA: Insufficient documentation

## 2014-08-31 DIAGNOSIS — Z8505 Personal history of malignant neoplasm of liver: Secondary | ICD-10-CM | POA: Insufficient documentation

## 2014-08-31 DIAGNOSIS — K766 Portal hypertension: Secondary | ICD-10-CM | POA: Insufficient documentation

## 2014-08-31 DIAGNOSIS — K3189 Other diseases of stomach and duodenum: Secondary | ICD-10-CM | POA: Diagnosis not present

## 2014-08-31 DIAGNOSIS — G629 Polyneuropathy, unspecified: Secondary | ICD-10-CM | POA: Insufficient documentation

## 2014-08-31 DIAGNOSIS — Z1211 Encounter for screening for malignant neoplasm of colon: Secondary | ICD-10-CM | POA: Insufficient documentation

## 2014-08-31 DIAGNOSIS — K7460 Unspecified cirrhosis of liver: Secondary | ICD-10-CM | POA: Insufficient documentation

## 2014-08-31 DIAGNOSIS — I509 Heart failure, unspecified: Secondary | ICD-10-CM | POA: Insufficient documentation

## 2014-08-31 DIAGNOSIS — D128 Benign neoplasm of rectum: Secondary | ICD-10-CM | POA: Diagnosis not present

## 2014-08-31 DIAGNOSIS — I252 Old myocardial infarction: Secondary | ICD-10-CM | POA: Diagnosis not present

## 2014-08-31 DIAGNOSIS — D123 Benign neoplasm of transverse colon: Secondary | ICD-10-CM | POA: Diagnosis not present

## 2014-08-31 DIAGNOSIS — C220 Liver cell carcinoma: Secondary | ICD-10-CM | POA: Insufficient documentation

## 2014-08-31 DIAGNOSIS — I8500 Esophageal varices without bleeding: Secondary | ICD-10-CM | POA: Insufficient documentation

## 2014-08-31 DIAGNOSIS — F17210 Nicotine dependence, cigarettes, uncomplicated: Secondary | ICD-10-CM | POA: Insufficient documentation

## 2014-08-31 DIAGNOSIS — D127 Benign neoplasm of rectosigmoid junction: Secondary | ICD-10-CM | POA: Diagnosis not present

## 2014-08-31 HISTORY — DX: Cocaine abuse, uncomplicated: F14.10

## 2014-08-31 HISTORY — DX: Inflammatory liver disease, unspecified: K75.9

## 2014-08-31 HISTORY — PX: COLONOSCOPY WITH PROPOFOL: SHX5780

## 2014-08-31 HISTORY — PX: ESOPHAGOGASTRODUODENOSCOPY (EGD) WITH PROPOFOL: SHX5813

## 2014-08-31 HISTORY — DX: Cannabis use, unspecified, uncomplicated: F12.90

## 2014-08-31 SURGERY — COLONOSCOPY WITH PROPOFOL
Anesthesia: General

## 2014-08-31 MED ORDER — SODIUM CHLORIDE 0.9 % IV SOLN
INTRAVENOUS | Status: DC
  Administered 2014-08-31: 08:00:00 via INTRAVENOUS

## 2014-08-31 MED ORDER — SODIUM CHLORIDE 0.9 % IV SOLN: INTRAVENOUS | Status: DC

## 2014-08-31 MED ORDER — PROPOFOL INFUSION 10 MG/ML OPTIME
INTRAVENOUS | Status: DC | PRN
  Administered 2014-08-31: 120 ug/kg/min via INTRAVENOUS

## 2014-08-31 MED ORDER — GLYCOPYRROLATE 0.2 MG/ML IJ SOLN
INTRAMUSCULAR | Status: DC | PRN
  Administered 2014-08-31 (×2): 0.2 mg via INTRAVENOUS

## 2014-08-31 MED ORDER — PHENYLEPHRINE HCL 10 MG/ML IJ SOLN
INTRAMUSCULAR | Status: DC | PRN
  Administered 2014-08-31: 100 ug via INTRAVENOUS

## 2014-08-31 MED ORDER — LIDOCAINE HCL (CARDIAC) 20 MG/ML IV SOLN
INTRAVENOUS | Status: DC | PRN
  Administered 2014-08-31: 20 mg via INTRAVENOUS

## 2014-08-31 MED ORDER — PROPOFOL 10 MG/ML IV BOLUS
INTRAVENOUS | Status: DC | PRN
  Administered 2014-08-31: 20 mg via INTRAVENOUS
  Administered 2014-08-31: 60 mg via INTRAVENOUS
  Administered 2014-08-31: 20 mg via INTRAVENOUS

## 2014-08-31 NOTE — Op Note (Signed)
[facility] Gastroenterology Patient Name: Phillip Warren Procedure Date: 08/31/2014 8:47 AM MRN: [id number] Account #: [id number] Date of Birth: 1955/10/01 Admit Type: Outpatient Age: 59 Room: [facility] ENDO ROOM 1 Gender: Male Note Status: Finalized Procedure:         Colonoscopy Indications:       Screening for colorectal malignant neoplasm, This is the                     patient's first colonoscopy Patient Profile:   This is a 59 year old male. Providers:         Gerrit Heck. Rayann Heman, MD Referring MD:      Glendon Axe (Referring MD) Medicines:         Propofol per Anesthesia Complications:     No immediate complications. Procedure:         Pre-Anesthesia Assessment:                    - Prior to the procedure, a History and Physical was                     performed, and patient medications, allergies and                     sensitivities were reviewed. The patient's tolerance of                     previous anesthesia was reviewed.                    After obtaining informed consent, the colonoscope was                     passed under direct vision. Throughout the procedure, the                     patient's blood pressure, pulse, and oxygen saturations                     were monitored continuously. The Colonoscope was                     introduced through the anus and advanced to the the                     terminal ileum. The colonoscopy was performed without                     difficulty. The patient tolerated the procedure well. The                     quality of the bowel preparation was good. Findings:      The perianal exam findings include non-thrombosed external hemorrhoids.      A 5 mm polyp was found in the mid transverse colon. The polyp was       sessile. The polyp was removed with a hot snare. Resection and retrieval       were complete.      A 12 mm polyp was found in the recto-sigmoid colon. The polyp was       pedunculated. The polyp was  removed with a hot snare. Resection and       retrieval were complete. To prevent bleeding after the polypectomy, one       hemostatic clip was successfully placed. There was  no bleeding at the       end of the procedure.      A 3 mm polyp was found in the rectum. The polyp was sessile. The polyp       was removed with a jumbo cold forceps. Resection and retrieval were       complete.      A localized area of granular mucosa was found at the ileocecal valve.       Biopsies were taken with a cold forceps for histology.      A few small angioectasias without bleeding were found in the transverse       colon.      The exam was otherwise without abnormality. Impression:        - Non-thrombosed external hemorrhoids found on perianal                     exam.                    - One 5 mm polyp in the mid transverse colon. Resected and                     retrieved.                    - One 12 mm polyp at the recto-sigmoid colon. Resected and                     retrieved. Clip was placed.                    - One 3 mm polyp in the rectum. Resected and retrieved.                    - Granularity at the ileocecal valve. Biopsied.                    - A few non-bleeding colonic angioectasias. Recommendation:    - Observe patient in GI recovery unit.                    - High fiber diet.                    - Continue present medications.                    - Await pathology results.                    - Repeat colonoscopy for surveillance based on pathology                     results. Likely in 3 years.                    - Return to GI clinic.                    - The findings and recommendations were discussed with the                     patient.                    - The findings and recommendations were discussed with the  patient's family. Procedure Code(s): --- Professional ---                    [phone], Colonoscopy, flexible; with removal of tumor(s),                      polyp(s), or other lesion(s) by snare technique                    34287, 74, Colonoscopy, flexible; with biopsy, single or                     multiple CPT copyright 2014 American Medical Association. All rights reserved. The codes documented in this report are preliminary and upon coder review may  be revised to meet current compliance requirements. Mellody Life, MD 08/31/2014 9:40:20 AM This report has been signed electronically. Number of Addenda: 0 Note Initiated On: 08/31/2014 8:47 AM Scope Withdrawal Time: 0 hours 20 minutes 48 seconds  Total Procedure Duration: 0 hours 24 minutes 9 seconds       [facility]

## 2014-08-31 NOTE — Anesthesia Postprocedure Evaluation (Signed)
  Anesthesia Post-op Note  Patient: Phillip Warren  Procedure(s) Performed: Procedure(s): COLONOSCOPY WITH PROPOFOL (N/A) ESOPHAGOGASTRODUODENOSCOPY (EGD) WITH PROPOFOL (N/A)  Anesthesia type:General  Patient location: PACU  Post pain: Pain level controlled  Post assessment: Post-op Vital signs reviewed, Patient's Cardiovascular Status Stable, Respiratory Function Stable, Patent Airway and No signs of Nausea or vomiting  Post vital signs: Reviewed and stable  Last Vitals:  Filed Vitals:   08/31/14 1020  BP: 116/103  Pulse: 79  Temp:   Resp: 17    Level of consciousness: awake, alert  and patient cooperative  Complications: No apparent anesthesia complications

## 2014-08-31 NOTE — Discharge Instructions (Signed)

## 2014-08-31 NOTE — Op Note (Signed)
[facility] Patient Name: Phillip Warren Procedure Date: 08/31/2014 8:45 AM MRN: [id number] Account #: [id number] Date of Birth: 10/31/1955 Admit Type: Outpatient Age: 59 Room: [facility] ENDO ROOM 1 Gender: Male Note Status: Finalized Procedure:         Upper GI endoscopy Indications:       Cirrhosis rule out esophageal varices Patient Profile:   This is a 59 year old male. Providers:         Gerrit Heck. Rayann Heman, MD Referring MD:      Glendon Axe (Referring MD) Medicines:         Propofol per Anesthesia Complications:     No immediate complications. Procedure:         Pre-Anesthesia Assessment:                    - Prior to the procedure, a History and Physical was                     performed, and patient medications, allergies and                     sensitivities were reviewed. The patient's tolerance of                     previous anesthesia was reviewed.                    After obtaining informed consent, the endoscope was passed                     under direct vision. Throughout the procedure, the                     patient's blood pressure, pulse, and oxygen saturations                     were monitored continuously. The Endoscope was introduced                     through the mouth, and advanced to the second part of                     duodenum. The upper GI endoscopy was accomplished without                     difficulty. The patient tolerated the procedure well. Findings:      Grade II varices were found in the lower third of the esophagus.      Moderate portal hypertensive gastropathy was found in the gastric body.      Diffuse moderate mucosal variance characterized by congestion and       erythema was found in the duodenal bulb. .(Portal HTN duodenopathty Impression:        - Grade II esophageal varices.                    - Portal hypertensive gastropathy.                    - Mucosal variant in the duodenum.(Portal HTN            duodenopathty)                    - No specimens collected. Recommendation:    - Perform a colonoscopy today.                    -  Start nadolol, titrate to HR of 60                    - The findings and recommendations were discussed with the                     patient.                    - The findings and recommendations were discussed with the                     patient's family.                    - Return to liver clinic. Procedure Code(s): --- Professional ---                    [phone], Esophagogastroduodenoscopy, flexible, transoral;                     diagnostic, including collection of specimen(s) by                     brushing or washing, when performed (separate procedure) CPT copyright 2014 American Medical Association. All rights reserved. The codes documented in this report are preliminary and upon coder review may  be revised to meet current compliance requirements. Mellody Life, MD 08/31/2014 9:09:20 AM This report has been signed electronically. Number of Addenda: 0 Note Initiated On: 08/31/2014 8:45 AM      [facility]

## 2014-08-31 NOTE — H&P (Signed)
  Primary Care Physician:  Glendon Axe, MD  Pre-Procedure History & Physical: HPI:  Phillip Warren is a 59 y.o. male is here for an colonoscopy / EGD   Past Medical History  Diagnosis Date  . Neuropathy   . Myocardial infarction   . Cancer     liver lesion initial staging.  . CHF (congestive heart failure)   . Hepatitis   . Cocaine abuse   . Marijuana use   Cirrhosis  Past Surgical History  Procedure Laterality Date  . Cardiac surgery      Prior to Admission medications   Medication Sig Start Date End Date Taking? Authorizing Provider  fluticasone (FLONASE) 50 MCG/ACT nasal spray Place 2 sprays into both nostrils daily.   Yes Historical Provider, MD  gabapentin (NEURONTIN) 800 MG tablet Take 1 tablet by mouth 3 (three) times daily. 08/08/14 09/07/14 Yes Historical Provider, MD  diphenhydrAMINE (BENADRYL) 25 MG tablet Take 1 tablet (25 mg total) by mouth every 6 (six) hours as needed. Patient not taking: Reported on 08/24/2014 08/11/14   Carrie Mew, MD  nitroGLYCERIN (NITROSTAT) 0.4 MG SL tablet Place 0.4 mg under the tongue. 05/10/13   Historical Provider, MD  pregabalin (LYRICA) 50 MG capsule Take by mouth. 08/14/14 08/14/15  Historical Provider, MD    Allergies as of 08/03/2014  . (Not on File)    History reviewed. No pertinent family history.  History   Social History  . Marital Status: Widowed    Spouse Name: N/A  . Number of Children: N/A  . Years of Education: N/A   Occupational History  . Not on file.   Social History Main Topics  . Smoking status: Current Every Day Smoker -- 1.00 packs/day    Types: Cigarettes  . Smokeless tobacco: Never Used  . Alcohol Use: Yes     Comment: Daily  . Drug Use: Yes    Special: Marijuana, Codeine  . Sexual Activity: Not on file   Other Topics Concern  . Not on file   Social History Narrative     Physical Exam: BP 108/64 mmHg  Pulse 65  Temp(Src) 97.5 F (36.4 C) (Tympanic)  Resp 18  Ht 6' (1.829 m)  Wt  66.225 kg (146 lb)  BMI 19.80 kg/m2  SpO2 98% General:   Alert,  pleasant and cooperative in NAD, appears chronically ill Head:  Normocephalic and atraumatic. Neck:  Supple; no masses or thyromegaly. Lungs:  Clear throughout to auscultation.    Heart:  Regular rate and rhythm. Abdomen:  Soft, nontender and nondistended. Normal bowel sounds, without guarding, and without rebound.   Neurologic:  Alert and  oriented x4;  grossly normal neurologically.  Impression/Plan: Phillip Warren is here for an colonoscopy to be performed for screening, EGD for cirrhosis, r/o varices  Risks, benefits, limitations, and alternatives regarding  Colonoscopy/EGD have been reviewed with the patient.  Questions have been answered.  All parties agreeable.   Josefine Class, MD  08/31/2014, 8:51 AM

## 2014-08-31 NOTE — Transfer of Care (Signed)
Immediate Anesthesia Transfer of Care Note  Patient: Phillip Warren  Procedure(s) Performed: Procedure(s): COLONOSCOPY WITH PROPOFOL (N/A) ESOPHAGOGASTRODUODENOSCOPY (EGD) WITH PROPOFOL (N/A)  Patient Location: PACU and Endoscopy Unit  Anesthesia Type:General  Level of Consciousness: sedated  Airway & Oxygen Therapy: Patient Spontanous Breathing and Patient connected to nasal cannula oxygen  Post-op Assessment: Report given to RN and Post -op Vital signs reviewed and stable  Post vital signs: Reviewed and stable  Last Vitals:  Filed Vitals:   08/31/14 0805  BP: 108/64  Pulse: 65  Temp: 36.4 C  Resp: 18    Complications: No apparent anesthesia complications

## 2014-08-31 NOTE — Anesthesia Preprocedure Evaluation (Addendum)
Anesthesia Evaluation  Patient identified by MRN, date of birth, ID band Patient awake    Reviewed: Allergy & Precautions, H&P , NPO status , Patient's Chart, lab work & pertinent test results, reviewed documented beta blocker date and time   Airway Mallampati: III  TM Distance: >3 FB Neck ROM: limited    Dental  (+) Edentulous Upper, Edentulous Lower, Poor Dentition, Missing   Pulmonary shortness of breath and with exertion, Current Smoker,  breath sounds clear to auscultation  Pulmonary exam normal       Cardiovascular Exercise Tolerance: Good + CAD, + Past MI and +CHF Normal cardiovascular examRhythm:regular Rate:Normal     Neuro/Psych negative neurological ROS  negative psych ROS   GI/Hepatic negative GI ROS, (+) Cirrhosis -      , Hepatitis -, C  Endo/Other  negative endocrine ROS  Renal/GU negative Renal ROS  negative genitourinary   Musculoskeletal   Abdominal   Peds  Hematology   Anesthesia Other Findings Past Medical History:   Neuropathy                                                   Myocardial infarction                                        Cancer                                                         Comment:liver lesion initial staging.   CHF (congestive heart failure)                               Hepatitis                                                    Cocaine abuse                                                Marijuana use                                                Reproductive/Obstetrics negative OB ROS                          Anesthesia Physical Anesthesia Plan  ASA: III  Anesthesia Plan: General   Post-op Pain Management:    Induction:   Airway Management Planned:   Additional Equipment:   Intra-op Plan:   Post-operative Plan:   Informed Consent: I have reviewed the patients History and Physical, chart, labs and discussed the procedure  including the  risks, benefits and alternatives for the proposed anesthesia with the patient or authorized representative who has indicated his/her understanding and acceptance.   Dental Advisory Given  Plan Discussed with: Anesthesiologist, CRNA and Surgeon  Anesthesia Plan Comments:         Anesthesia Quick Evaluation

## 2014-09-01 LAB — SURGICAL PATHOLOGY

## 2014-09-04 ENCOUNTER — Inpatient Hospital Stay: Payer: Medicare Other | Attending: Oncology | Admitting: Oncology

## 2014-09-04 VITALS — BP 111/74 | HR 79 | Temp 96.9°F | Wt 151.0 lb

## 2014-09-04 DIAGNOSIS — B182 Chronic viral hepatitis C: Secondary | ICD-10-CM | POA: Diagnosis not present

## 2014-09-04 DIAGNOSIS — K769 Liver disease, unspecified: Secondary | ICD-10-CM | POA: Insufficient documentation

## 2014-09-04 DIAGNOSIS — G629 Polyneuropathy, unspecified: Secondary | ICD-10-CM | POA: Diagnosis not present

## 2014-09-04 DIAGNOSIS — F17210 Nicotine dependence, cigarettes, uncomplicated: Secondary | ICD-10-CM | POA: Insufficient documentation

## 2014-09-04 DIAGNOSIS — R634 Abnormal weight loss: Secondary | ICD-10-CM | POA: Diagnosis not present

## 2014-09-04 DIAGNOSIS — F1410 Cocaine abuse, uncomplicated: Secondary | ICD-10-CM | POA: Diagnosis not present

## 2014-09-04 DIAGNOSIS — F1290 Cannabis use, unspecified, uncomplicated: Secondary | ICD-10-CM | POA: Diagnosis not present

## 2014-09-04 DIAGNOSIS — F1020 Alcohol dependence, uncomplicated: Secondary | ICD-10-CM | POA: Diagnosis not present

## 2014-09-04 DIAGNOSIS — R772 Abnormality of alphafetoprotein: Secondary | ICD-10-CM | POA: Diagnosis not present

## 2014-09-04 DIAGNOSIS — Z79899 Other long term (current) drug therapy: Secondary | ICD-10-CM | POA: Diagnosis not present

## 2014-09-04 DIAGNOSIS — I8500 Esophageal varices without bleeding: Secondary | ICD-10-CM | POA: Insufficient documentation

## 2014-09-04 DIAGNOSIS — I252 Old myocardial infarction: Secondary | ICD-10-CM | POA: Diagnosis not present

## 2014-09-04 DIAGNOSIS — C220 Liver cell carcinoma: Secondary | ICD-10-CM

## 2014-09-04 DIAGNOSIS — Z7952 Long term (current) use of systemic steroids: Secondary | ICD-10-CM | POA: Diagnosis not present

## 2014-09-04 NOTE — Progress Notes (Signed)
Patient does not have living will.  Current everyday smoker. Patient here today for scan results.

## 2014-09-05 ENCOUNTER — Other Ambulatory Visit: Payer: Self-pay | Admitting: Oncology

## 2014-09-05 DIAGNOSIS — C220 Liver cell carcinoma: Secondary | ICD-10-CM

## 2014-09-09 ENCOUNTER — Encounter: Payer: Self-pay | Admitting: Oncology

## 2014-09-09 NOTE — Progress Notes (Signed)
[place] @ [facility] [phone]  Fax:([phone] [place]: 1955/12/19  MR#: [id number]  BTD#:[id number]  Patient Care Team: Glendon Axe, MD as PCP - General (Internal Medicine) Clent Jacks, RN as Registered Nurse  CHIEF COMPLAINT:  Abnormal CT scan and MRI scan suggestive of liver lesions either primary hepatocellular cancer versus metastases.  Acute chronic alcoholism Hepatitis C Chronic tobacco abuse. 2.  Clinical picture is suggestive of primary hepatocellular cancer patient has been referred to interventional radiologist for cryo-radiofrequency ablation (August, 2016) Alpha-fetoprotein is elevated (483)  VISIT DIAGNOSIS:   No diagnosis found.    No history exists.    Oncology Flowsheet 08/11/2014  methylPREDNISolone sodium succinate 125 mg/2 mL (SOLU-MEDROL) IV 125 mg    INTERVAL HISTORY\ 59 year old gentleman who was referred to me by primary care physician and gastroenterologist because of increasing abdominal discomfort.  Patient has burning and stinging in lower extremity has a chronic neuropathy.  Other problem includes hepatitis C, chronic acute and chronic alcoholism and ongoing smoker. Significant weight loss. Patient is being seen by gastroenterology is CT scan of abdomen and MRI scan of abdomen was suggestive of either primary hepatocellular cancer versus metastases to the liver.  Colonoscopy is being planned.  REVIEW OF SYSTEMS:   GENERAL:  Patient is thin  and  Lean has lost significant weight PERFORMANCE STATUS (ECOG): 01 HEENT:  No visual changes, runny nose, sore throat, mouth sores or tenderness. Lungs: No shortness of breath or cough.  No hemoptysis. Cardiac:  No chest pain, palpitations, orthopnea, or PND. GI: Abdominal discomfort.  Persistent.  No rectal bleeding.  No hematemesis.  History of hepatitis C GU:  No urgency, frequency, dysuria, or hematuria. Musculoskeletal:  No back pain.  No joint  pain.  No muscle tenderness. Extremities:  No pain or swelling. Skin:  No rashes or skin changes. Neuro:  Chronic sensory neuropathy Endocrine:  No diabetes, thyroid issues, hot flashes or night sweats. Psych:  No mood changes, depression or anxiety. Pain:  No focal pain. Review of systems:  All other systems reviewed and found to be negative.  As per HPI. Otherwise, a complete review of systems is negatve.  PAST MEDICAL HISTORY: Past Medical History  Diagnosis Date  . Neuropathy   . Myocardial infarction   . Cancer     liver lesion initial staging.  . CHF (congestive heart failure)   . Hepatitis   . Cocaine abuse   . Marijuana use    Coronary artery disease is a 35.  Cocaine and marijuana use. PAST SURGICAL HISTORY: Past Surgical History  Procedure Laterality Date  . Cardiac surgery    . Colonoscopy with propofol N/A 08/31/2014    Procedure: COLONOSCOPY WITH PROPOFOL;  Surgeon: Josefine Class, MD;  Location: [facility] ENDOSCOPY;  Service: Endoscopy;  Laterality: N/A;  . Esophagogastroduodenoscopy (egd) with propofol N/A 08/31/2014    Procedure: ESOPHAGOGASTRODUODENOSCOPY (EGD) WITH PROPOFOL;  Surgeon: Josefine Class, MD;  Location: [facility] ENDOSCOPY;  Service: Endoscopy;  Laterality: N/A;    FAMILY HISTORY There is no significant family history of breast cancer, ovarian cancer, colon cancer history of lung cancer  His review of hypertension, cerebrovascular accident, colon cancer in the family.        ADVANCED DIRECTIVES:   Patient does have advance healthcare directive, Patient   does not desire to make any changes HEALTH MAINTENANCE: Social History  Substance Use Topics  . Smoking status: Current Every Day Smoker -- 1.00 packs/day  Types: Cigarettes  . Smokeless tobacco: Never Used  . Alcohol Use: Yes     Comment: Daily      Allergies  Allergen Reactions  . Multihance [Gadobenate] Hives and Shortness Of Breath  . Contrast Media [Iodinated [employer]] Itching  . Hydrocodone     Other reaction(s): UNKNOWN  . Vicodin [Hydrocodone-Acetaminophen] Nausea Only and Nausea And Vomiting    Vomiting     Current Outpatient Prescriptions  Medication Sig Dispense Refill  . diphenhydrAMINE (BENADRYL) 25 MG tablet Take 1 tablet (25 mg total) by mouth every 6 (six) hours as needed. 30 tablet 0  . fluticasone (FLONASE) 50 MCG/ACT nasal spray Place 2 sprays into both nostrils daily.    Marland Kitchen gabapentin (NEURONTIN) 800 MG tablet Take 1 tablet by mouth 3 (three) times daily.    . nadolol (CORGARD) 40 MG tablet Take by mouth.    . nitroGLYCERIN (NITROSTAT) 0.4 MG SL tablet Place 0.4 mg under the tongue.    . pregabalin (LYRICA) 50 MG capsule Take by mouth.     No current facility-administered medications for this visit.    OBJECTIVE: PHYSICAL EXAM: Gen. status: Patient is alert and oriented not any acute distress. Somewhat cachectic.  He has lost significant weight Head exam was generally normal. There was no scleral icterus or corneal arcus. Mucous membranes were moist. Lymphatic system: Supraclavicular, cervical, axillary, inguinal lymph nodes are not palpable Abdomen soft.  Tenderness in right upper quadrant.  Liver may be palpable a few fingerbreadths below costal margin . no ascites. Lower extremity no edema. Examination of the skin revealed no evidence of significant rashes, suspicious appearing nevi or other concerning lesions. Neurological system: Chronic sensory neuropathy  Examination of the chest was unremarkable. There were no bony deformities, no asymmetry, and no other abnormalities.. Cardiac exam revealed the PMI to be normally situated and sized. The rhythm was regular and no extrasystoles were noted during several minutes of auscultation. The first and second heart sounds were normal and physiologic splitting of the second heart sound was noted. There were no murmurs, rubs, clicks, or gallops.  Filed Vitals:   09/04/14 1157    BP: 111/74  Pulse: 79  Temp: 96.9 F (36.1 C)     Body mass index is 20.48 kg/(m^2).    ECOG FS:1 - Symptomatic but completely ambulatory  LAB RESULTS:  No visits with results within 2 Day(s) from this visit. Latest known visit with results is:  Admission on 08/31/2014, Discharged on 08/31/2014  Component Date Value Ref Range Status  . SURGICAL PATHOLOGY 08/31/2014    Final                   Value:Surgical Pathology CASE: [id number] PATIENT: Kathy Breach Surgical Pathology Report     SPECIMEN SUBMITTED: A. Ileo-cecal valve, cold biopsy B. Colon polyp, transverse, hot snare C. Colon polyp, recto-sigmoid, hot snare D. Rectal polyp, cold biopsy  CLINICAL HISTORY: None provided  PRE-OPERATIVE DIAGNOSIS: CCA screen, R/O varices  POST-OPERATIVE DIAGNOSIS: Portal hypertensive gastropathy, esophageal varices, colon polyps     DIAGNOSIS: A. ILEOCECAL VALVE; COLD BIOPSY: - SMALL BOWEL MUCOSA WITH PROMINENT LYMPHOID AGGREGATES. - NEGATIVE FOR INTRAEPITHELIAL LYMPHOCYTOSIS, DYSPLASIA AND MALIGNANCY.  B. COLON POLYP, TRANSVERSE; HOT SNARE: - TUBULAR ADENOMA. - NEGATIVE FOR HIGH-GRADE DYSPLASIA AND MALIGNANCY.  C. COLON POLYP, RECTOSIGMOID; HOT SNARE: - TUBULOVILLOUS ADENOMA. - NEGATIVE FOR HIGH-GRADE DYSPLASIA AND MALIGNANCY.  D. 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 -  NEGATIVE FOR                          HIGH-GRADE DYSPLASIA AND MALIGNANCY.   Note: A traditional serrated adenoma is a neoplastic lesion with hyperplastic glandular architecture (resembling a hyperplastic polyp) lined by dysplastic/adenomatous epithelium (resembling a tubular adenoma). These lesions are thought progress to carcinoma in alternate molecular pathways when compared to the standard tubular adenomas. If complete endoscopic removal is not possible, increased surveillance to assess for progression is  recommended.    GROSS DESCRIPTION:  A. Labeled: C biopsy ileocecal valve Tissue Fragment(s): 3 Measurement: 0.1-0.3 cm Comment: pink  Entirely submitted in cassette(s): 1  B. Labeled: hot snare TC polyp Tissue Fragment(s): multiple Measurement: aggregate, 1.1 x 0.2 x 0.1 cm Comment: tan fragments and fecal material  Entirely submitted in cassette(s): 1  C. Labeled: hot snare rectosigmoid polyp Tissue Fragment(s): 1 Measurement: 0.7 x 0.8 x 0.4 cm Comment: pink polyp, inked blue, bisect                         ed  Entirely submitted in cassette(s): 1  D. Labeled: biopsy of rectal polyp Tissue Fragment(s): 1 Measurement: 0.35 cm Comment: pink  Entirely submitted in cassette(s): 1         Final Diagnosis performed by Delorse Lek, MD.  Electronically signed 09/01/2014 11:35:31AM    The electronic signature indicates that the named Attending Pathologist has evaluated the specimen  Technical component performed at [facility], [address] [place], [place] [address] Lab: [phone] Dir: Darrick Penna. Evette Doffing, MD  Professional component performed at [facility], [facility], [place], [place], [place] [address] Lab: [phone] Dir: Dellia Nims. Rubinas, MD       STUDIES: Ct Chest W Contrast  08/30/2014   CLINICAL DATA:  Hepatic cellular carcinoma. Initial treatment strategy.  EXAM: CT CHEST WITH CONTRAST  TECHNIQUE: Multidetector CT imaging of the chest was performed during intravenous contrast administration.  CONTRAST:  48mL OMNIPAQUE IOHEXOL 300 MG/ML  SOLN  COMPARISON:  MRI 08/11/2014  FINDINGS: Mediastinum/Nodes: No axillary or supraclavicular lymphadenopathy. No mediastinal hilar lymphadenopathy. Coronary calcifications are present. Esophagus normal.  Lungs/Pleura: No suspicious pulmonary nodules. There is 1 region of irregular linear thickening in the LEFT upper lobe measuring 7 mm on image 30, series 3. On the coronal images there is a  thin-walled cavitary lesions associated this linear thickening (image 77, series 5)  Upper abdomen: Enhancing lesion in the RIGHT hepatic lobe is subtly seen on image 59, series 2. Adrenal glands are normal. No aggressive osseous lesion.  Musculoskeletal: No aggressive osseous lesion.  IMPRESSION: 1. No evidence of thoracic metastasis. 2. Focus of linear thickening and thin-walled cavitation in the LEFT lower lobe likely represents benign chronic change. Recommend follow-up CT in 3 to 6 months to demonstrate stability. 3. Enhancing lesion the RIGHT hepatic lobe again demonstrated and characterized on comparison MRI.   Electronically Signed   By: Suzy Bouchard M.D.   On: 08/30/2014 14:10   Mr Liver W Wo Contrast  08/11/2014   CLINICAL DATA:  Evaluate liver lesion on CT  EXAM: MRI ABDOMEN WITHOUT AND WITH CONTRAST  TECHNIQUE: Multiplanar multisequence MR imaging of the abdomen was performed both before and after the administration of intravenous contrast.  CONTRAST:  14 mL Multihance IV  Note: The patient had a contrast reaction to Multihance (facial hives/pruritus follow by shortness of breath). He was evaluated by Dr.  Register and sent to the ED.  COMPARISON:  CT abdomen pelvis dated 08/08/2014  FINDINGS: Lower chest:  Lung bases are clear.  Hepatobiliary: 2.0 x 1.6 cm lesion in segment 8 (series 10/image 18) which demonstrates early arterial enhancement, central portal venous washout, and a pseudocapsule (series 11/image 17), all of which are characteristic of metastatic carcinoma in the setting of background cirrhosis. These findings meet AASLD imaging criteria for diagnosis.  No additional lesions are seen.  Layering gallstones (series 3/ image 13). No associated inflammatory changes. No intrahepatic or extrahepatic ductal dilatation.  Pancreas: Within normal limits.  Spleen: Splenomegaly, measuring 17.4 cm in craniocaudal dimension.  Adrenals/Urinary Tract: Adrenal glands are within normal limits.  Kidneys  are within normal limits.  No hydronephrosis.  Stomach/Bowel: Stomach and visualized bowel are unremarkable.  Vascular/Lymphatic: No evidence abdominal aortic aneurysm.  Small upper abdominal lymph nodes, including a 14 mm portacaval node, likely reactive.  Other: No abdominal ascites  Musculoskeletal: No focal osseous lesions.  IMPRESSION: 2.0 cm enhancing lesion in segment 8 of the liver which meets AASLD imaging criteria for diagnosis of HCC.  Cholelithiasis, without associated inflammatory changes.  Splenomegaly.  Intravenous contrast reaction to Multihance. Patient was sent to the ED for further evaluation.   Electronically Signed   By: Julian Hy M.D.   On: 08/11/2014 09:52    ASSESSMENT:  Normal liver lesions with normal CEA and CA 19-9 and high out for fetoprotein Radiological characteristic may suggest hepatocellular cancer Patient may not be a candidate for surgical resection but radiofrequency ablation may be considered. Case will be discussed in tumor conference and I would discuss that with the radiologist 2.  Await colonoscopy result 3.  HIV is nonreactive 4.  Patient has hepatitis C. Treatment being planned by gastroenterologist  PLAN:   Case discussion in tumor conference Radiofrequency ablation versus surgical resection Reevaluate patient after her radiofrequency ablation Lab data including MRI scan and CT scan has been reviewed independently The patient was explained the nature of disease and expected line of treatment including all the options of resection versus radiofrequency ablation Patient is not a candidate for surgical intervention.  This patient is also does not want surgical intervention at present time After radiological intervention and he will be followed with serial alpha-fetoprotein and x-rays  Patient expressed understanding and was in agreement with this plan. He also understands that He can call clinic at any time with any questions, concerns, or  complaints.    No matching staging information was found for the patient.  Forest Gleason, MD   09/09/2014 3:16 PM

## 2014-09-20 ENCOUNTER — Other Ambulatory Visit: Payer: Medicare Other

## 2014-10-12 ENCOUNTER — Ambulatory Visit
Admission: RE | Admit: 2014-10-12 | Discharge: 2014-10-12 | Disposition: A | Discharge status: Home / Self Care | Payer: Medicare Other | Source: Ambulatory Visit | Attending: Oncology | Admitting: Oncology

## 2014-10-12 DIAGNOSIS — B199 Unspecified viral hepatitis without hepatic coma: Secondary | ICD-10-CM | POA: Insufficient documentation

## 2014-10-12 DIAGNOSIS — C220 Liver cell carcinoma: Secondary | ICD-10-CM | POA: Insufficient documentation

## 2014-10-12 HISTORY — DX: Anxiety disorder, unspecified: F41.9

## 2014-10-12 HISTORY — DX: Unspecified osteoarthritis, unspecified site: M19.90

## 2014-10-12 HISTORY — DX: Headache, unspecified: R51.9

## 2014-10-12 HISTORY — DX: Pneumonia, unspecified organism: J18.9

## 2014-10-12 HISTORY — DX: Anorexia: R63.0

## 2014-10-12 HISTORY — DX: Chronic obstructive pulmonary disease, unspecified: J44.9

## 2014-10-12 HISTORY — DX: Atherosclerotic heart disease of native coronary artery without angina pectoris: I25.10

## 2014-10-12 HISTORY — DX: Unspecified cirrhosis of liver: K74.60

## 2014-10-12 HISTORY — DX: Headache: R51

## 2014-10-12 HISTORY — DX: Reserved for inherently not codable concepts without codable children: IMO0001

## 2014-10-12 NOTE — Consult Note (Signed)
Chief Complaint: Hepatocellular carcinoma.  Patient was seen in consultation today at the request of [place].  Referring Physician(s): Choksi,Janak  History of Present Illness: Phillip Warren is a 59 y.o. male with a history of chronic hepatitis infection, alcoholism and cirrhosis followed by Dr. Dorise Bullion of Gastroenterology and Dr. Glendon Axe.  CT of the abdomen with contrast on 08/08/2014 revealed a new hypervascular central right lobe lesion in segment 8 measuring 2.1 x 1.8 cm. Laboratory tests at that time demonstrated elevated AFP level of 482.4. Liver function tests were essentially normal with only mild elevation of AST. Further imaging with MRI on 08/11/2014 confirmed the presence of a hypervascular mass estimated to be approximately 2.0 x 1.6 cm by MRI and showing MRI characteristics meeting AASLD imaging criteria for the diagnosis of hepatocellular carcinoma. On that study, the patient sustained an allergic reaction to gadolinium injection with itching and hives necessitating emergent treatment in the emergency department. He did not have evidence of anaphylaxis at that time.    EGD by Dr. Rayann Heman on 08/31/2014 demonstrated grade 2 distal esophageal varices, portal gastropathy and portal hypertensive duodenopathy. Colonoscopy on the same day showed 3 separate polyps that were removed. Biopsy was also performed at the level of the ileocecal valve. Pathology showed tubular adenoma and tubulovillous adenoma with no evidence of dysplasia or malignancy.   Phillip Warren is having some periodic vague right-sided abdominal pain. He also complains of chronic pain in his lower extremities and feet.   Past Medical History  Diagnosis Date  . Neuropathy   . CHF (congestive heart failure)   . Cocaine abuse   . Marijuana use   . Myocardial infarction     massive MI at age 24  . COPD (chronic obstructive pulmonary disease)   . Shortness of breath dyspnea   . Pneumonia   . Anxiety    trouble sleeping; lost two sisters and his mother close together  . Anorexia     has trouble eating due to poor appetite  . Headache   . Arthritis     hands, feet (burning, stinging)  . Coronary artery disease   . Cancer     liver lesion initial staging.  . Hepatitis     B and C (chronic)  . Cirrhosis of liver     Past Surgical History  Procedure Laterality Date  . Cardiac surgery    . Colonoscopy with propofol N/A 08/31/2014    Procedure: COLONOSCOPY WITH PROPOFOL;  Surgeon: Josefine Class, MD;  Location: [facility] ENDOSCOPY;  Service: Endoscopy;  Laterality: N/A;  . Esophagogastroduodenoscopy (egd) with propofol N/A 08/31/2014    Procedure: ESOPHAGOGASTRODUODENOSCOPY (EGD) WITH PROPOFOL;  Surgeon: Josefine Class, MD;  Location: [facility] ENDOSCOPY;  Service: Endoscopy;  Laterality: N/A;  . Coronary angioplasty      at age 44 secondary to massive MI    Allergies: Multihance; Contrast media; Hydrocodone; and Vicodin  Medications: Prior to Admission medications   Medication Sig Start Date End Date Taking? Authorizing Provider  gabapentin (NEURONTIN) 400 MG capsule Take 400 mg by mouth 3 (three) times daily.   Yes Historical Provider, MD  nitroGLYCERIN (NITROSTAT) 0.4 MG SL tablet Place 0.4 mg under the tongue. 05/10/13  Yes Historical Provider, MD  diphenhydrAMINE (BENADRYL) 25 MG tablet Take 1 tablet (25 mg total) by mouth every 6 (six) hours as needed. Patient not taking: Reported on 10/12/2014 08/11/14   Carrie Mew, MD  fluticasone [facility]) 50 MCG/ACT nasal spray  Place 2 sprays into both nostrils daily.    Historical Provider, MD  nadolol (CORGARD) 40 MG tablet Take by mouth. 08/31/14 08/31/15  Historical Provider, MD     No family history on file.  Social History   Social History  . Marital Status: Widowed    Spouse Name: N/A  . Number of Children: N/A  . Years of Education: N/A   Social History Main Topics  . Smoking status: Current Every Day Smoker -- 1.00 packs/day     Types: Cigarettes  . Smokeless tobacco: Never Used  . Alcohol Use: Yes     Comment: Daily  . Drug Use: Yes    Special: Marijuana, Codeine  . Sexual Activity: Not on file   Other Topics Concern  . Not on file   Social History Narrative    ECOG Status: 1 - Symptomatic but completely ambulatory  Review of Systems: A 12 point ROS discussed and pertinent positives are indicated in the HPI above.  All other systems are negative.  Review of Systems  Constitutional: Negative.   Respiratory: Negative.   Cardiovascular: Negative.   Gastrointestinal: Positive for abdominal pain. Negative for nausea, vomiting, diarrhea, constipation, blood in stool, abdominal distention, anal bleeding and rectal pain.  Genitourinary: Negative.   Musculoskeletal:       Chronic lower leg and foot pain bilaterally.  Neurological: Negative.      Vital Signs: BP 96/70 mmHg  Pulse 90  Temp(Src) 97.9 F (36.6 C)  Resp 18  Ht 6' (1.829 m)  Wt 156 lb (70.761 kg)  BMI 21.15 kg/m2  SpO2 96%  Physical Exam  Constitutional: He is oriented to person, place, and time. No distress.  Thin, cachectic appearing.  Cardiovascular: Normal rate, regular rhythm and normal heart sounds.  Exam reveals no gallop and no friction rub.   No murmur heard. Pulmonary/Chest: Effort normal and breath sounds normal. No respiratory distress. He has no wheezes. He has no rales.  Abdominal: Soft. Bowel sounds are normal. He exhibits no distension and no mass. There is no tenderness. There is no rebound and no guarding.  Musculoskeletal: He exhibits no edema or tenderness.  Neurological: He is alert and oriented to person, place, and time.  Skin: He is not diaphoretic.    Imaging: No results found.  Labs:  CBC:  Recent Labs  05/14/14 1606 08/24/14 1205  WBC 5.8 4.6  HGB 16.4 15.2  HCT 49.7 45.5  PLT 111* 164    COAGS: No results for input(s): INR, APTT in the last 8760 hours.  BMP:  Recent Labs   05/14/14 1606 08/24/14 1205  NA 135 131*  K 4.2 4.4  CL 99* 99*  CO2 26 24  GLUCOSE 109* 82  BUN 17 10  CALCIUM 9.0 8.5*  CREATININE 1.14 0.99  GFRNONAA >60 >60  GFRAA >60 >60    LIVER FUNCTION TESTS:  Recent Labs  05/14/14 1606 08/24/14 1205  BILITOT 0.7 0.8  AST 162* 67*  ALT 184* 61  ALKPHOS 101 80  PROT 8.8* 8.9*  ALBUMIN 3.9 4.1    TUMOR MARKERS:  Recent Labs  08/24/14 1205  AFPTM 482.4*  CEA 2.5  CA199 22    Assessment and Plan:  I met with Phillip Warren and reviewed imaging findings with him. The MRI findings alone are diagnostic for hepatocellular carcinoma. Elevation of AFP provides additional evidence of HCC.  The tumor measures only 2 cm in mass with diameter and is amenable in size and location to  percutaneous thermal ablation under CT guidance. The procedure is performed under general anesthesia with overnight hospital observation. Details of ablation were discussed with Phillip Warren, including risks. After discussion, the patient would like to proceed with scheduling thermal ablation of the hepatocellular carcinoma. We will begin the scheduling process. I anticipate that ablation will be performed sometime in early October.  Thank you for this interesting consult.  I greatly enjoyed meeting Phillip Warren and look forward to participating in his care.  A copy of this report was sent to the requesting provider on this date.  SignedAletta Edouard T 10/12/2014, 2:08 PM   I spent a total of 40 Minutes in face to face in clinical consultation, greater than 50% of which was counseling/coordinating care for treatment of hepatocellular carcinoma.

## 2014-10-13 ENCOUNTER — Other Ambulatory Visit: Payer: Self-pay | Admitting: *Deleted

## 2014-10-13 ENCOUNTER — Other Ambulatory Visit: Payer: Self-pay | Admitting: Interventional Radiology

## 2014-10-13 DIAGNOSIS — C220 Liver cell carcinoma: Secondary | ICD-10-CM

## 2014-10-16 ENCOUNTER — Inpatient Hospital Stay: Payer: Medicare Other | Admitting: Oncology

## 2014-10-16 ENCOUNTER — Inpatient Hospital Stay: Payer: Medicare Other

## 2014-10-24 ENCOUNTER — Other Ambulatory Visit: Payer: Self-pay | Admitting: Radiology

## 2014-10-24 NOTE — Progress Notes (Signed)
Called for orders surgery 11-10-14 pre op 11-02-14 Thanks

## 2014-11-01 ENCOUNTER — Inpatient Hospital Stay: Payer: Medicare Other | Attending: Oncology

## 2014-11-01 ENCOUNTER — Inpatient Hospital Stay: Payer: Medicare Other | Admitting: Oncology

## 2014-11-01 NOTE — Patient Instructions (Signed)
Phillip Warren  11/01/2014   Your procedure is scheduled on: 11/10/2014 Report to Radiology first on the first floor.  Then:   Report to [facility] Main  Entrance [facility]  elevators to 3rd floor to  [place] at      [place] AM.  Call this number if you have problems the morning of surgery [phone]   Remember: ONLY 1 PERSON MAY GO WITH YOU TO SHORT STAY TO GET  READY MORNING OF [place].  Do not eat food or drink liquids :After Midnight.     Take these medicines the morning of surgery with A SIP OF WATER:none                                  You may not have any metal on your body including hair pins and              piercings  Do not wear jewelry,  lotions, powders or perfumes, deodorant                          Men may shave face and neck.   Do not bring valuables to the hospital. [place].  Contacts, dentures or bridgework may not be worn into surgery.  Leave suitcase in the car. After surgery it may be brought to your room.         Special Instructions: coughing and deep breathing exercises, leg exercises               Please read over the following fact sheets you were given: _____________________________________________________________________             [facility] - Preparing for Surgery Before surgery, you can play an important role.  Because skin is not sterile, your skin needs to be as free of germs as possible.  You can reduce the number of germs on your skin by washing with CHG (chlorahexidine gluconate) soap before surgery.  CHG is an antiseptic cleaner which kills germs and bonds with the skin to continue killing germs even after washing. Please DO NOT use if you have an allergy to CHG or antibacterial soaps.  If your skin becomes reddened/irritated stop using the CHG and inform your nurse when you arrive at Short Stay. Do not shave (including legs and underarms) for at  least 48 hours prior to the first CHG shower.  You may shave your face/neck. Please follow these instructions carefully:  1.  Shower with CHG Soap the night before surgery and the  morning of Surgery.  2.  If you choose to wash your hair, wash your hair first as usual with your  normal  shampoo.  3.  After you shampoo, rinse your hair and body thoroughly to remove the  shampoo.                           4.  Use CHG as you would any other liquid soap.  You can apply chg directly  to the skin and wash  Gently with a scrungie or clean washcloth.  5.  Apply the CHG Soap to your body ONLY FROM THE NECK DOWN.   Do not use on face/ open                           Wound or open sores. Avoid contact with eyes, ears mouth and genitals (private parts).                       Wash face,  Genitals (private parts) with your normal soap.             6.  Wash thoroughly, paying special attention to the area where your surgery  will be performed.  7.  Thoroughly rinse your body with warm water from the neck down.  8.  DO NOT shower/wash with your normal soap after using and rinsing off  the CHG Soap.                9.  Pat yourself dry with a clean towel.            10.  Wear clean pajamas.            11.  Place clean sheets on your bed the night of your first shower and do not  sleep with pets. Day of Surgery : Do not apply any lotions/deodorants the morning of surgery.  Please wear clean clothes to the hospital/surgery center.  FAILURE TO FOLLOW THESE INSTRUCTIONS MAY RESULT IN THE CANCELLATION OF YOUR SURGERY PATIENT SIGNATURE_________________________________  NURSE SIGNATURE__________________________________  ________________________________________________________________________

## 2014-11-02 ENCOUNTER — Inpatient Hospital Stay (HOSPITAL_COMMUNITY)
Admission: RE | Admit: 2014-11-02 | Discharge: 2014-11-02 | Disposition: A | Discharge status: Home / Self Care | Payer: Medicare Other | Source: Ambulatory Visit

## 2014-11-09 ENCOUNTER — Encounter (HOSPITAL_COMMUNITY): Payer: Self-pay

## 2014-11-09 ENCOUNTER — Other Ambulatory Visit: Payer: Self-pay | Admitting: Radiology

## 2014-11-09 ENCOUNTER — Encounter (HOSPITAL_COMMUNITY)
Admission: RE | Admit: 2014-11-09 | Discharge: 2014-11-09 | Disposition: A | Discharge status: Home / Self Care | Payer: Medicare Other | Source: Ambulatory Visit | Attending: Interventional Radiology | Admitting: Interventional Radiology

## 2014-11-09 DIAGNOSIS — J449 Chronic obstructive pulmonary disease, unspecified: Secondary | ICD-10-CM | POA: Diagnosis not present

## 2014-11-09 DIAGNOSIS — Z79899 Other long term (current) drug therapy: Secondary | ICD-10-CM | POA: Diagnosis not present

## 2014-11-09 DIAGNOSIS — F17210 Nicotine dependence, cigarettes, uncomplicated: Secondary | ICD-10-CM | POA: Diagnosis not present

## 2014-11-09 DIAGNOSIS — B182 Chronic viral hepatitis C: Secondary | ICD-10-CM | POA: Diagnosis not present

## 2014-11-09 DIAGNOSIS — K7460 Unspecified cirrhosis of liver: Secondary | ICD-10-CM | POA: Diagnosis not present

## 2014-11-09 DIAGNOSIS — I509 Heart failure, unspecified: Secondary | ICD-10-CM | POA: Diagnosis not present

## 2014-11-09 DIAGNOSIS — K219 Gastro-esophageal reflux disease without esophagitis: Secondary | ICD-10-CM | POA: Diagnosis not present

## 2014-11-09 DIAGNOSIS — B181 Chronic viral hepatitis B without delta-agent: Secondary | ICD-10-CM | POA: Diagnosis not present

## 2014-11-09 DIAGNOSIS — I2510 Atherosclerotic heart disease of native coronary artery without angina pectoris: Secondary | ICD-10-CM | POA: Diagnosis not present

## 2014-11-09 DIAGNOSIS — C220 Liver cell carcinoma: Secondary | ICD-10-CM | POA: Diagnosis not present

## 2014-11-09 DIAGNOSIS — I252 Old myocardial infarction: Secondary | ICD-10-CM | POA: Diagnosis not present

## 2014-11-09 DIAGNOSIS — M1990 Unspecified osteoarthritis, unspecified site: Secondary | ICD-10-CM | POA: Diagnosis not present

## 2014-11-09 DIAGNOSIS — G629 Polyneuropathy, unspecified: Secondary | ICD-10-CM | POA: Diagnosis not present

## 2014-11-09 HISTORY — DX: Alcohol abuse, uncomplicated: F10.10

## 2014-11-09 HISTORY — DX: Anesthesia of skin: R20.0

## 2014-11-09 HISTORY — DX: Paresthesia of skin: R20.2

## 2014-11-09 HISTORY — DX: Gastro-esophageal reflux disease without esophagitis: K21.9

## 2014-11-09 HISTORY — DX: Personal history of other specified conditions: Z87.898

## 2014-11-09 HISTORY — DX: Repeated falls: R29.6

## 2014-11-09 HISTORY — DX: Tobacco use: Z72.0

## 2014-11-09 HISTORY — DX: Tinnitus, unspecified ear: H93.19

## 2014-11-09 LAB — PROTIME-INR
INR: 1.07 (ref 0.00–1.49)
PROTHROMBIN TIME: 14.1 s (ref 11.6–15.2)

## 2014-11-09 LAB — COMPREHENSIVE METABOLIC PANEL
ALK PHOS: 93 U/L (ref 38–126)
ALT: 169 U/L — AB (ref 17–63)
ANION GAP: 7 (ref 5–15)
AST: 232 U/L — ABNORMAL HIGH (ref 15–41)
Albumin: 4.1 g/dL (ref 3.5–5.0)
BUN: 8 mg/dL (ref 6–20)
CALCIUM: 9 mg/dL (ref 8.9–10.3)
CO2: 27 mmol/L (ref 22–32)
CREATININE: 1.14 mg/dL (ref 0.61–1.24)
Chloride: 100 mmol/L — ABNORMAL LOW (ref 101–111)
Glucose, Bld: 90 mg/dL (ref 65–99)
Potassium: 5 mmol/L (ref 3.5–5.1)
SODIUM: 134 mmol/L — AB (ref 135–145)
TOTAL PROTEIN: 7.9 g/dL (ref 6.5–8.1)
Total Bilirubin: 1.6 mg/dL — ABNORMAL HIGH (ref 0.3–1.2)

## 2014-11-09 LAB — CBC WITH DIFFERENTIAL/PLATELET
Basophils Absolute: 0 10*3/uL (ref 0.0–0.1)
Basophils Relative: 1 %
EOS ABS: 0.3 10*3/uL (ref 0.0–0.7)
EOS PCT: 7 %
HCT: 43.5 % (ref 39.0–52.0)
HEMOGLOBIN: 15.4 g/dL (ref 13.0–17.0)
LYMPHS ABS: 1.1 10*3/uL (ref 0.7–4.0)
LYMPHS PCT: 29 %
MCH: 33.4 pg (ref 26.0–34.0)
MCHC: 35.4 g/dL (ref 30.0–36.0)
MCV: 94.4 fL (ref 78.0–100.0)
MONOS PCT: 9 %
Monocytes Absolute: 0.3 10*3/uL (ref 0.1–1.0)
NEUTROS PCT: 54 %
Neutro Abs: 2.1 10*3/uL (ref 1.7–7.7)
Platelets: 127 10*3/uL — ABNORMAL LOW (ref 150–400)
RBC: 4.61 MIL/uL (ref 4.22–5.81)
RDW: 14.2 % (ref 11.5–15.5)
WBC: 3.8 10*3/uL — ABNORMAL LOW (ref 4.0–10.5)

## 2014-11-09 LAB — ABO/RH: ABO/RH(D): A POS

## 2014-11-09 LAB — APTT: aPTT: 29 seconds (ref 24–37)

## 2014-11-09 NOTE — Progress Notes (Addendum)
CT chest w/contrast epic 08/30/2014

## 2014-11-09 NOTE — Progress Notes (Signed)
CBCD and CMP results/epic per PAT visit 11/09/2014 sent to Dr Kathlene Cote

## 2014-11-09 NOTE — Patient Instructions (Signed)
Phillip Warren  11/09/2014   Your procedure is scheduled on: Friday November 10, 2014   Report to [facility] Main  Entrance to radiology department and arrive at 6: 30 AM.  Call this number if you have problems the morning of surgery [phone]   Remember: ONLY 1 PERSON MAY GO WITH YOU TO SHORT STAY TO GET  READY MORNING OF [name].  Do not eat food or drink liquids :After Midnight.     Take these medicines the morning of surgery with A SIP OF WATER: Gabapentin (Neurontin)                                You may not have any metal on your body including hair pins and              piercings  Do not wear jewelry, lotions, powders or colognes, deodorant                           Men may shave face and neck.   Do not bring valuables to the hospital. [name].  Contacts, dentures or bridgework may not be worn into surgery.  Leave suitcase in the car. After surgery it may be brought to your room. _____________________________________________________________________             [facility] - Preparing for Surgery Before surgery, you can play an important role.  Because skin is not sterile, your skin needs to be as free of germs as possible.  You can reduce the number of germs on your skin by washing with CHG (chlorahexidine gluconate) soap before surgery.  CHG is an antiseptic cleaner which kills germs and bonds with the skin to continue killing germs even after washing. Please DO NOT use if you have an allergy to CHG or antibacterial soaps.  If your skin becomes reddened/irritated stop using the CHG and inform your nurse when you arrive at Short Stay. Do not shave (including legs and underarms) for at least 48 hours prior to the first CHG shower.  You may shave your face/neck. Please follow these instructions carefully:  1.  Shower with CHG Soap the night before surgery and the  morning of Surgery.  2.  If  you choose to wash your hair, wash your hair first as usual with your  normal  shampoo.  3.  After you shampoo, rinse your hair and body thoroughly to remove the  shampoo.                           4.  Use CHG as you would any other liquid soap.  You can apply chg directly  to the skin and wash                       Gently with a scrungie or clean washcloth.  5.  Apply the CHG Soap to your body ONLY FROM THE NECK DOWN.   Do not use on face/ open                           Wound  or open sores. Avoid contact with eyes, ears mouth and genitals (private parts).                       Wash face,  Genitals (private parts) with your normal soap.             6.  Wash thoroughly, paying special attention to the area where your surgery  will be performed.  7.  Thoroughly rinse your body with warm water from the neck down.  8.  DO NOT shower/wash with your normal soap after using and rinsing off  the CHG Soap.                9.  Pat yourself dry with a clean towel.            10.  Wear clean pajamas.            11.  Place clean sheets on your bed the night of your first shower and do not  sleep with pets. Day of Surgery : Do not apply any lotions/deodorants the morning of surgery.  Please wear clean clothes to the hospital/surgery center.  FAILURE TO FOLLOW THESE INSTRUCTIONS MAY RESULT IN THE CANCELLATION OF YOUR SURGERY PATIENT SIGNATURE_________________________________  NURSE SIGNATURE__________________________________  ________________________________________________________________________

## 2014-11-10 ENCOUNTER — Observation Stay (HOSPITAL_COMMUNITY)
Admission: RE | Admit: 2014-11-10 | Discharge: 2014-11-11 | Disposition: A | Discharge status: Home / Self Care | Payer: Medicare Other | Source: Ambulatory Visit | Attending: Interventional Radiology | Admitting: Interventional Radiology

## 2014-11-10 ENCOUNTER — Encounter (HOSPITAL_COMMUNITY)
Admission: RE | Disposition: A | Discharge status: Home / Self Care | Payer: Self-pay | Source: Ambulatory Visit | Attending: Interventional Radiology

## 2014-11-10 ENCOUNTER — Ambulatory Visit (HOSPITAL_COMMUNITY)
Admission: RE | Admit: 2014-11-10 | Discharge: 2014-11-10 | Disposition: A | Discharge status: Home / Self Care | Payer: Medicare Other | Source: Ambulatory Visit | Attending: Interventional Radiology | Admitting: Interventional Radiology

## 2014-11-10 ENCOUNTER — Ambulatory Visit (HOSPITAL_COMMUNITY): Payer: Medicare Other | Admitting: Anesthesiology

## 2014-11-10 ENCOUNTER — Encounter (HOSPITAL_COMMUNITY): Payer: Self-pay | Admitting: Anesthesiology

## 2014-11-10 DIAGNOSIS — K7460 Unspecified cirrhosis of liver: Secondary | ICD-10-CM | POA: Diagnosis not present

## 2014-11-10 DIAGNOSIS — I252 Old myocardial infarction: Secondary | ICD-10-CM | POA: Insufficient documentation

## 2014-11-10 DIAGNOSIS — K219 Gastro-esophageal reflux disease without esophagitis: Secondary | ICD-10-CM | POA: Insufficient documentation

## 2014-11-10 DIAGNOSIS — C220 Liver cell carcinoma: Secondary | ICD-10-CM | POA: Diagnosis not present

## 2014-11-10 DIAGNOSIS — K769 Liver disease, unspecified: Secondary | ICD-10-CM

## 2014-11-10 DIAGNOSIS — G629 Polyneuropathy, unspecified: Secondary | ICD-10-CM | POA: Insufficient documentation

## 2014-11-10 DIAGNOSIS — F17210 Nicotine dependence, cigarettes, uncomplicated: Secondary | ICD-10-CM | POA: Insufficient documentation

## 2014-11-10 DIAGNOSIS — M1990 Unspecified osteoarthritis, unspecified site: Secondary | ICD-10-CM | POA: Insufficient documentation

## 2014-11-10 DIAGNOSIS — B181 Chronic viral hepatitis B without delta-agent: Secondary | ICD-10-CM | POA: Diagnosis not present

## 2014-11-10 DIAGNOSIS — I2510 Atherosclerotic heart disease of native coronary artery without angina pectoris: Secondary | ICD-10-CM

## 2014-11-10 DIAGNOSIS — Z79899 Other long term (current) drug therapy: Secondary | ICD-10-CM | POA: Insufficient documentation

## 2014-11-10 DIAGNOSIS — R109 Unspecified abdominal pain: Secondary | ICD-10-CM

## 2014-11-10 DIAGNOSIS — I509 Heart failure, unspecified: Secondary | ICD-10-CM | POA: Diagnosis not present

## 2014-11-10 DIAGNOSIS — J449 Chronic obstructive pulmonary disease, unspecified: Secondary | ICD-10-CM | POA: Insufficient documentation

## 2014-11-10 DIAGNOSIS — B182 Chronic viral hepatitis C: Secondary | ICD-10-CM | POA: Insufficient documentation

## 2014-11-10 LAB — TYPE AND SCREEN
ABO/RH(D): A POS
Antibody Screen: NEGATIVE

## 2014-11-10 SURGERY — RADIO FREQUENCY ABLATION
Anesthesia: General

## 2014-11-10 MED ORDER — HYDROMORPHONE HCL 1 MG/ML IJ SOLN
1.0000 mg | INTRAMUSCULAR | Status: DC | PRN
  Administered 2014-11-10 – 2014-11-11 (×7): 1 mg via INTRAVENOUS
  Filled 2014-11-10 (×8): qty 1

## 2014-11-10 MED ORDER — NITROGLYCERIN 0.4 MG SL SUBL: 0.4000 mg | SUBLINGUAL_TABLET | SUBLINGUAL | Status: DC | PRN

## 2014-11-10 MED ORDER — ROCURONIUM BROMIDE 100 MG/10ML IV SOLN
INTRAVENOUS | Status: DC | PRN
  Administered 2014-11-10: 20 mg via INTRAVENOUS
  Administered 2014-11-10: 30 mg via INTRAVENOUS

## 2014-11-10 MED ORDER — LACTATED RINGERS IV SOLN
INTRAVENOUS | Status: DC
  Administered 2014-11-10 (×2): via INTRAVENOUS

## 2014-11-10 MED ORDER — HYDROMORPHONE HCL 1 MG/ML IJ SOLN
INTRAMUSCULAR | Status: AC
  Administered 2014-11-10: 1 mg
  Filled 2014-11-10: qty 1

## 2014-11-10 MED ORDER — PROPOFOL 10 MG/ML IV BOLUS
INTRAVENOUS | Status: DC | PRN
  Administered 2014-11-10: 180 mg via INTRAVENOUS

## 2014-11-10 MED ORDER — FENTANYL CITRATE (PF) 250 MCG/5ML IJ SOLN
INTRAMUSCULAR | Status: AC
  Filled 2014-11-10: qty 10

## 2014-11-10 MED ORDER — PIPERACILLIN-TAZOBACTAM 3.375 G IVPB
3.3750 g | Freq: Once | INTRAVENOUS | Status: AC
  Administered 2014-11-10: 3.375 g via INTRAVENOUS
  Filled 2014-11-10: qty 50

## 2014-11-10 MED ORDER — SENNOSIDES-DOCUSATE SODIUM 8.6-50 MG PO TABS
1.0000 | ORAL_TABLET | Freq: Every day | ORAL | Status: DC | PRN
  Filled 2014-11-10: qty 1

## 2014-11-10 MED ORDER — ONDANSETRON HCL 4 MG/2ML IJ SOLN
4.0000 mg | Freq: Four times a day (QID) | INTRAMUSCULAR | Status: DC | PRN
  Administered 2014-11-10: 4 mg via INTRAVENOUS

## 2014-11-10 MED ORDER — SODIUM CHLORIDE 0.9 % IV SOLN
INTRAVENOUS | Status: DC
  Administered 2014-11-10 – 2014-11-11 (×2): via INTRAVENOUS

## 2014-11-10 MED ORDER — ENSURE ENLIVE PO LIQD: 237.0000 mL | Freq: Two times a day (BID) | ORAL | Status: DC

## 2014-11-10 MED ORDER — GABAPENTIN 400 MG PO CAPS
800.0000 mg | ORAL_CAPSULE | Freq: Three times a day (TID) | ORAL | Status: DC
  Administered 2014-11-10 – 2014-11-11 (×3): 800 mg via ORAL
  Filled 2014-11-10 (×3): qty 2

## 2014-11-10 MED ORDER — EPHEDRINE SULFATE 50 MG/ML IJ SOLN
INTRAMUSCULAR | Status: DC | PRN
  Administered 2014-11-10 (×3): 5 mg via INTRAVENOUS
  Administered 2014-11-10 (×3): 10 mg via INTRAVENOUS

## 2014-11-10 MED ORDER — OXYCODONE HCL 5 MG PO TABS
5.0000 mg | ORAL_TABLET | Freq: Four times a day (QID) | ORAL | Status: DC | PRN
  Administered 2014-11-10 – 2014-11-11 (×2): 5 mg via ORAL
  Filled 2014-11-10 (×2): qty 1

## 2014-11-10 MED ORDER — PROMETHAZINE HCL 25 MG/ML IJ SOLN: 6.2500 mg | INTRAMUSCULAR | Status: DC | PRN

## 2014-11-10 MED ORDER — GLYCOPYRROLATE 0.2 MG/ML IJ SOLN
INTRAMUSCULAR | Status: DC | PRN
  Administered 2014-11-10: .6 mg via INTRAVENOUS

## 2014-11-10 MED ORDER — LIDOCAINE HCL (CARDIAC) 20 MG/ML IV SOLN
INTRAVENOUS | Status: DC | PRN
  Administered 2014-11-10: 50 mg via INTRAVENOUS

## 2014-11-10 MED ORDER — PHENYLEPHRINE HCL 10 MG/ML IJ SOLN
INTRAMUSCULAR | Status: DC | PRN
  Administered 2014-11-10 (×2): 80 ug via INTRAVENOUS

## 2014-11-10 MED ORDER — HYDROMORPHONE HCL 1 MG/ML IJ SOLN
0.2500 mg | INTRAMUSCULAR | Status: DC | PRN
  Administered 2014-11-10 (×4): 0.5 mg via INTRAVENOUS

## 2014-11-10 MED ORDER — DOCUSATE SODIUM 100 MG PO CAPS
100.0000 mg | ORAL_CAPSULE | Freq: Two times a day (BID) | ORAL | Status: DC
  Administered 2014-11-10 – 2014-11-11 (×2): 100 mg via ORAL
  Filled 2014-11-10 (×5): qty 1

## 2014-11-10 MED ORDER — FENTANYL CITRATE (PF) 100 MCG/2ML IJ SOLN
INTRAMUSCULAR | Status: DC | PRN
  Administered 2014-11-10: 100 ug via INTRAVENOUS
  Administered 2014-11-10: 50 ug via INTRAVENOUS
  Administered 2014-11-10: 100 ug via INTRAVENOUS
  Administered 2014-11-10 (×4): 50 ug via INTRAVENOUS
  Administered 2014-11-10: 100 ug via INTRAVENOUS
  Administered 2014-11-10: 50 ug via INTRAVENOUS

## 2014-11-10 MED ORDER — MIDAZOLAM HCL 5 MG/5ML IJ SOLN
INTRAMUSCULAR | Status: DC | PRN
  Administered 2014-11-10: 2 mg via INTRAVENOUS

## 2014-11-10 MED ORDER — HYDROMORPHONE HCL 1 MG/ML IJ SOLN
INTRAMUSCULAR | Status: AC
  Filled 2014-11-10: qty 1

## 2014-11-10 MED ORDER — SUCCINYLCHOLINE CHLORIDE 20 MG/ML IJ SOLN
INTRAMUSCULAR | Status: DC | PRN
  Administered 2014-11-10: 100 mg via INTRAVENOUS

## 2014-11-10 MED ORDER — FENTANYL CITRATE (PF) 100 MCG/2ML IJ SOLN
INTRAMUSCULAR | Status: AC
  Filled 2014-11-10: qty 2

## 2014-11-10 MED ORDER — MIDAZOLAM HCL 2 MG/2ML IJ SOLN
INTRAMUSCULAR | Status: AC
  Filled 2014-11-10: qty 2

## 2014-11-10 MED ORDER — NEOSTIGMINE METHYLSULFATE 10 MG/10ML IV SOLN
INTRAVENOUS | Status: DC | PRN
  Administered 2014-11-10: 3 mg via INTRAVENOUS

## 2014-11-10 NOTE — Anesthesia Preprocedure Evaluation (Addendum)
Anesthesia Evaluation  Patient identified by MRN, date of birth, ID band Patient awake    Reviewed: Allergy & Precautions, NPO status , Patient's Chart, lab work & pertinent test results  Airway Mallampati: II  TM Distance: >3 FB Neck ROM: Full    Dental no notable dental hx.    Pulmonary shortness of breath, pneumonia, COPD, Current Smoker,  95% RA saturation   Pulmonary exam normal breath sounds clear to auscultation       Cardiovascular + CAD, + Past MI and +CHF  negative cardio ROS Normal cardiovascular exam Rhythm:Regular Rate:Normal     Neuro/Psych  Headaches, Anxiety Right foot pain.    GI/Hepatic GERD  ,(+) Cirrhosis     substance abuse  alcohol use, cocaine use and marijuana use, Hepatitis -, C  Endo/Other  negative endocrine ROS  Renal/GU negative Renal ROS  negative genitourinary   Musculoskeletal  (+) Arthritis ,   Abdominal   Peds negative pediatric ROS (+)  Hematology  (+) anemia ,   Anesthesia Other Findings   Reproductive/Obstetrics negative OB ROS                           Anesthesia Physical Anesthesia Plan  ASA: III  Anesthesia Plan: General   Post-op Pain Management:    Induction: Intravenous  Airway Management Planned: Oral ETT  Additional Equipment:   Intra-op Plan:   Post-operative Plan: Extubation in OR  Informed Consent: I have reviewed the patients History and Physical, chart, labs and discussed the procedure including the risks, benefits and alternatives for the proposed anesthesia with the patient or authorized representative who has indicated his/her understanding and acceptance.   Dental advisory given  Plan Discussed with: CRNA  Anesthesia Plan Comments:         Anesthesia Quick Evaluation

## 2014-11-10 NOTE — Anesthesia Procedure Notes (Signed)
Procedure Name: Intubation Date/Time: 11/10/2014 9:16 AM Performed by: Dimas Millin, Preslei Blakley F Pre-anesthesia Checklist: Patient identified, Emergency Drugs available, Suction available, Patient being monitored and Timeout performed Patient Re-evaluated:Patient Re-evaluated prior to inductionOxygen Delivery Method: Circle system utilized Preoxygenation: Pre-oxygenation with 100% oxygen Intubation Type: IV induction Laryngoscope Size: Miller and 2 Grade View: Grade I Tube type: Oral Tube size: 7.0 mm Number of attempts: 1 Airway Equipment and Method: Stylet Placement Confirmation: ETT inserted through vocal cords under direct vision,  positive ETCO2 and breath sounds checked- equal and bilateral Secured at: 22 cm Tube secured with: Tape Dental Injury: Teeth and Oropharynx as per pre-operative assessment

## 2014-11-10 NOTE — Progress Notes (Signed)
Patient ID: Phillip Warren, male   DOB: [birth year], 57 y.o.   MRN: [id number]    Referring Physician(s): Choksi  Chief Complaint:  Hepatocellular carcinoma  Subjective: Pt doing ok; had some mod RUQ/epigastric discomfort earlier but improved with oxycodone; tolerated diet well; denies N/V; had some dysuria with initial void post foley removal; throat sl tender from prev intubation   Allergies: Multihance; Contrast media; Hydrocodone; and Vicodin  Medications: Prior to Admission medications   Medication Sig Start Date End Date Taking? Authorizing Provider  gabapentin (NEURONTIN) 400 MG capsule Take 800 mg by mouth 3 (three) times daily.    Yes Historical Provider, MD  diphenhydrAMINE (BENADRYL) 25 MG tablet Take 1 tablet (25 mg total) by mouth every 6 (six) hours as needed. Patient not taking: Reported on 10/12/2014 08/11/14   Carrie Mew, MD  nitroGLYCERIN (NITROSTAT) 0.4 MG SL tablet Place 0.4 mg under the tongue every 5 (five) minutes as needed for chest pain.  05/10/13   Historical Provider, MD     Vital Signs: BP 127/93 mmHg  Pulse 122  Temp(Src) 97.4 F (36.3 C) (Oral)  Resp 18  Ht 6' (1.829 m)  Wt 140 lb 6 oz (63.674 kg)  BMI 19.03 kg/m2  SpO2 98%  Physical Exam puncture site RUQ/lat abd region clean and dry, mildly tender to palpation, abd soft,ND,+BS  Imaging: Ct Guide Tissue Ablation  11/10/2014  CLINICAL DATA:  History of cirrhosis, hepatitis-C and hypervascular 2.1 cm tumor in the right lobe of the liver with associated elevated AFP level. The liver lesion meets MRI criteria for the diagnosis of hepatocellular carcinoma. The patient now presents for ablation of the tumor. EXAM: CT-GUIDED CORE BIOPSY OF LIVER TUMOR CT-GUIDED PERCUTANEOUS THERMAL ABLATION OF HEPATOCELLULAR CARCINOMA ANESTHESIA/SEDATION: General MEDICATIONS: 3.375 g IV Zosyn CONTRAST:  No contrast was administered. PROCEDURE: The procedure, risks, benefits, and alternatives were explained to the  patient. Questions regarding the procedure were encouraged and answered. The patient understands and consents to the procedure. A time-out was performed prior to the procedure. The patient was placed under general anesthesia. Initial unenhanced CT was performed in a supine position to localize the liver and right lobe liver mass. The right abdominal wall was prepped with Betadine in a sterile fashion, and a sterile drape was applied covering the operative field. A sterile gown and sterile gloves were used for the procedure. A 17 gauge trocar needle was advanced into the right lobe liver tumor utilizing both ultrasound and CT guidance. Core biopsy was performed with an 18 gauge automated core biopsy device. A total of 2 core samples were submitted in formalin for pathologic analysis. After biopsy, several Gel-Foam pledgets were advanced through the outer needle as the needle was retracted. Under CT guidance, a NeuWave PR percutaneous thermal ablation probe was advanced into the liver tumor. Probe positioning was confirmed by CT prior to ablation. Thermal ablation was performed through the single probe. Thermal ablation was performed at 57 W for 12 minutes. Monitoring CT was performed during thermal ablation at 5 minutes and 10 minutes. After completing ablation, the percutaneous tract was cauterized and the probe removed. COMPLICATIONS: None FINDINGS: By CT, the focal tumor within the superior right lobe of the liver is visible without contrast but not well delineated. Diameter of the tumor is approximately 2.2 cm. This was also confirmed by ultrasound demonstrating a relatively hypoechoic discrete mass measuring 2.2 cm in greatest diameter. Probe placement and biopsy were performed primarily utilizing ultrasound guidance with CT also performed to  document probe position prior to ablation. IMPRESSION: CT guided percutaneous core biopsy and thermal ablation of right lobe hepatocellular carcinoma. Biopsy was performed  just prior to ablation to definitively document a tissue diagnosis. The patient will be observed overnight. Initial follow-up will be performed in approximately 4 weeks. Electronically Signed   By: Aletta Edouard M.D.   On: 11/10/2014 12:29   Ct Biopsy  11/10/2014  CLINICAL DATA:  History of cirrhosis, hepatitis-C and hypervascular 2.1 cm tumor in the right lobe of the liver with associated elevated AFP level. The liver lesion meets MRI criteria for the diagnosis of hepatocellular carcinoma. The patient now presents for ablation of the tumor. EXAM: CT-GUIDED CORE BIOPSY OF LIVER TUMOR CT-GUIDED PERCUTANEOUS THERMAL ABLATION OF HEPATOCELLULAR CARCINOMA ANESTHESIA/SEDATION: General MEDICATIONS: 3.375 g IV Zosyn CONTRAST:  No contrast was administered. PROCEDURE: The procedure, risks, benefits, and alternatives were explained to the patient. Questions regarding the procedure were encouraged and answered. The patient understands and consents to the procedure. A time-out was performed prior to the procedure. The patient was placed under general anesthesia. Initial unenhanced CT was performed in a supine position to localize the liver and right lobe liver mass. The right abdominal wall was prepped with Betadine in a sterile fashion, and a sterile drape was applied covering the operative field. A sterile gown and sterile gloves were used for the procedure. A 17 gauge trocar needle was advanced into the right lobe liver tumor utilizing both ultrasound and CT guidance. Core biopsy was performed with an 18 gauge automated core biopsy device. A total of 2 core samples were submitted in formalin for pathologic analysis. After biopsy, several Gel-Foam pledgets were advanced through the outer needle as the needle was retracted. Under CT guidance, a NeuWave PR percutaneous thermal ablation probe was advanced into the liver tumor. Probe positioning was confirmed by CT prior to ablation. Thermal ablation was performed through  the single probe. Thermal ablation was performed at 23 W for 12 minutes. Monitoring CT was performed during thermal ablation at 5 minutes and 10 minutes. After completing ablation, the percutaneous tract was cauterized and the probe removed. COMPLICATIONS: None FINDINGS: By CT, the focal tumor within the superior right lobe of the liver is visible without contrast but not well delineated. Diameter of the tumor is approximately 2.2 cm. This was also confirmed by ultrasound demonstrating a relatively hypoechoic discrete mass measuring 2.2 cm in greatest diameter. Probe placement and biopsy were performed primarily utilizing ultrasound guidance with CT also performed to document probe position prior to ablation. IMPRESSION: CT guided percutaneous core biopsy and thermal ablation of right lobe hepatocellular carcinoma. Biopsy was performed just prior to ablation to definitively document a tissue diagnosis. The patient will be observed overnight. Initial follow-up will be performed in approximately 4 weeks. Electronically Signed   By: Aletta Edouard M.D.   On: 11/10/2014 12:29    Labs:  CBC:  Recent Labs  05/14/14 1606 08/24/14 1205 11/09/14 1000  WBC 5.8 4.6 3.8*  HGB 16.4 15.2 15.4  HCT 49.7 45.5 43.5  PLT 111* 164 127*    COAGS:  Recent Labs  11/09/14 1000  INR 1.07  APTT 29    BMP:  Recent Labs  05/14/14 1606 08/24/14 1205 11/09/14 1000  NA 135 131* 134*  K 4.2 4.4 5.0  CL 99* 99* 100*  CO2 26 24 27   GLUCOSE 109* 82 90  BUN 17 10 8   CALCIUM 9.0 8.5* 9.0  CREATININE 1.14 0.99 1.14  GFRNONAA >  60 >60 >60  GFRAA >60 >60 >60    LIVER FUNCTION TESTS:  Recent Labs  05/14/14 1606 08/24/14 1205 11/09/14 1000  BILITOT 0.7 0.8 1.6*  AST 162* 67* 232*  ALT 184* 61 169*  ALKPHOS 101 80 93  PROT 8.8* 8.9* 7.9  ALBUMIN 3.9 4.1 4.1    Assessment and Plan: Pt s/p thermal ablation/core biopsy rt hepatic lobe tumor 10/14; for overnight obs; oxycodone for pain prn; hydrate;  monitor voiding sx's; check am labs; f/u with Dr. Kathlene Cote in [facility] in 4-6 weeks with AFP level; check path  Signed: D. Rowe Robert 11/10/2014, 4:22 PM   I spent a total of 15 minutes at the the patient's bedside AND on the patient's hospital floor or unit, greater than 50% of which was counseling/coordinating care for right hepatic lobe tumor biopsy/thermal ablation

## 2014-11-10 NOTE — Progress Notes (Signed)
8fr and 48fr foley insertions were attempted and successful by Carron Brazen, RN and Georgena Spurling, RN. Dr. Kathlene Cote was notified and condom catheter was order to be placed instead.

## 2014-11-10 NOTE — Transfer of Care (Signed)
Immediate Anesthesia Transfer of Care Note  Patient: Phillip Warren  Procedure(s) Performed: Procedure(s): LIVER MICROWAVE THERMAL ABLATION   (RADIOLOGY WITH ANESTHESIA) (N/A)  Patient Location: PACU  Anesthesia Type:General  Level of Consciousness: awake, alert  and oriented  Airway & Oxygen Therapy: Patient Spontanous Breathing and Patient connected to nasal cannula oxygen  Post-op Assessment: Report given to RN and Post -op Vital signs reviewed and stable  Post vital signs: Reviewed and stable  Last Vitals:  Filed Vitals:   11/10/14 1145  BP:   Pulse: 117  Temp:   Resp: 16    Complications: No apparent anesthesia complications

## 2014-11-10 NOTE — H&P (Signed)
Chief Complaint: Hepatocellular carcinoma  Referring Physician(s): Choksi,J  History of Present Illness: Phillip Warren is a 59 y.o. male with a history of chronic hepatitis B/C infection, alcoholism and cirrhosis followed by Dr. Dorise Bullion of Gastroenterology and Dr. Glendon Axe. CT of the abdomen with contrast on 08/08/2014 revealed a new hypervascular central right lobe lesion in segment 8 measuring 2.1 x 1.8 cm. Laboratory tests at that time demonstrated elevated AFP level of 482.4. Liver function tests were essentially normal with only mild elevation of AST. Further imaging with MRI on 08/11/2014 confirmed the presence of a hypervascular mass estimated to be approximately 2.0 x 1.6 cm by MRI and showing MRI characteristics meeting AASLD imaging criteria for the diagnosis of hepatocellular carcinoma. On that study, the patient sustained an allergic reaction to gadolinium injection with itching and hives necessitating emergent treatment in the emergency department. He did not have evidence of anaphylaxis at that time.He was seen recently in consultation by Dr. Kathlene Cote on 10/12/14 for treatment options of the hepatocellular carcinoma and deemed an appropriate candidate for percutaneous thermal ablation. He presents today for the above procedure.   Past Medical History  Diagnosis Date  . Neuropathy ([place])   . CHF (congestive heart failure) ([place])   . Cocaine abuse   . Marijuana use   . Myocardial infarction ([place])     massive MI at age 53  . COPD (chronic obstructive pulmonary disease) ([place])   . Shortness of breath dyspnea   . Pneumonia   . Anxiety     trouble sleeping; lost two sisters and his mother close together  . Anorexia     has trouble eating due to poor appetite  . Headache   . Arthritis     hands, feet (burning, stinging)  . Coronary artery disease   . Cancer [facility])     liver lesion initial staging.  . Hepatitis     B and C (chronic)  . Cirrhosis of liver ([place])   .  Numbness and tingling     legs and feet bilat   . Tinnitus   . H/O dizziness   . Repeated falls   . GERD (gastroesophageal reflux disease)   . Tobacco abuse   . Alcohol abuse     Past Surgical History  Procedure Laterality Date  . Cardiac surgery    . Colonoscopy with propofol N/A 08/31/2014    Procedure: COLONOSCOPY WITH PROPOFOL;  Surgeon: Josefine Class, MD;  Location[facility] ENDOSCOPY;  Service: Endoscopy;  Laterality: N/A;  . Esophagogastroduodenoscopy (egd) with propofol N/A 08/31/2014    Procedure: ESOPHAGOGASTRODUODENOSCOPY (EGD) WITH PROPOFOL;  Surgeon: Josefine Class, MD;  Location: [facility] ENDOSCOPY;  Service: Endoscopy;  Laterality: N/A;  . Coronary angioplasty      at age 34 secondary to massive MI    Allergies: Multihance; Contrast media; Hydrocodone; and Vicodin  Medications: Prior to Admission medications   Medication Sig Start Date End Date Taking? Authorizing Provider  gabapentin (NEURONTIN) 400 MG capsule Take 800 mg by mouth 3 (three) times daily.    Yes Historical Provider, MD  diphenhydrAMINE (BENADRYL) 25 MG tablet Take 1 tablet (25 mg total) by mouth every 6 (six) hours as needed. Patient not taking: Reported on 10/12/2014 08/11/14   Carrie Mew, MD  nitroGLYCERIN (NITROSTAT) 0.4 MG SL tablet Place 0.4 mg under the tongue every 5 (five) minutes as needed for chest pain.  05/10/13   Historical Provider, MD     History reviewed. No  pertinent family history.  Social History   Social History  . Marital Status: Widowed    Spouse Name: N/A  . Number of Children: N/A  . Years of Education: N/A   Social History Main Topics  . Smoking status: Current Every Day Smoker -- 0.50 packs/day for 30 years    Types: Cigarettes  . Smokeless tobacco: Never Used  . Alcohol Use: Yes     Comment: Daily  . Drug Use: Yes    Special: Marijuana, Cocaine     Comment: pt states has not used cocaine in 20 years   . Sexual Activity: Not Asked   Other Topics Concern    . None   Social History Narrative      Review of Systems  Constitutional: Positive for fatigue. Negative for fever and chills.  HENT: Positive for sinus pressure.   Respiratory: Positive for cough and shortness of breath.   Cardiovascular: Negative for chest pain.  Gastrointestinal: Positive for abdominal pain. Negative for nausea, vomiting and blood in stool.  Genitourinary: Negative for dysuria and hematuria.  Musculoskeletal: Negative for back pain.  Neurological: Positive for headaches.       Lower extremity neuropathy    Vital Signs: BP 114/79 mmHg  Pulse 91  Temp(Src) 98 F (36.7 C) (Oral)  Resp 18  Ht 6' (1.829 m)  Wt 140 lb 6 oz (63.674 kg)  BMI 19.03 kg/m2  SpO2 95%  Physical Exam  Constitutional:  Thin cachectic-appearing white male in no acute distress  Cardiovascular: Normal rate and regular rhythm.   Pulmonary/Chest: Effort normal and breath sounds normal.  Abdominal: Soft. Bowel sounds are normal.  Mild epigastric/right upper quadrant abdominal tenderness to palpation  Musculoskeletal: Normal range of motion. He exhibits no edema.    Mallampati Score:     Imaging: No results found.  Labs:  CBC:  Recent Labs  05/14/14 1606 08/24/14 1205 11/09/14 1000  WBC 5.8 4.6 3.8*  HGB 16.4 15.2 15.4  HCT 49.7 45.5 43.5  PLT 111* 164 127*    COAGS:  Recent Labs  11/09/14 1000  INR 1.07  APTT 29    BMP:  Recent Labs  05/14/14 1606 08/24/14 1205 11/09/14 1000  NA 135 131* 134*  K 4.2 4.4 5.0  CL 99* 99* 100*  CO2 26 24 27   GLUCOSE 109* 82 90  BUN 17 10 8   CALCIUM 9.0 8.5* 9.0  CREATININE 1.14 0.99 1.14  GFRNONAA >60 >60 >60  GFRAA >60 >60 >60    LIVER FUNCTION TESTS:  Recent Labs  05/14/14 1606 08/24/14 1205 11/09/14 1000  BILITOT 0.7 0.8 1.6*  AST 162* 67* 232*  ALT 184* 61 169*  ALKPHOS 101 80 93  PROT 8.8* 8.9* 7.9  ALBUMIN 3.9 4.1 4.1    TUMOR MARKERS:  Recent Labs  08/24/14 1205  AFPTM 482.4*  CEA 2.5   CA199 22    Assessment and Plan: Patient with history of cirrhosis, hepatitis B/C, elevated AFP and 2 cm enhancing lesion in segment 8 of the liver which meets criteria for hepatocellular carcinoma. Patient seen recently in consultation by Dr. Kathlene Cote for treatment options and he presents today for CT-guided percutaneous thermal ablation of the [facility]. Details/risks of procedure, including but not limited to, internal bleeding, infection, anesthesia related complications discussed with the patient with his understanding and consent. Following the procedure the patient will be admitted for overnight observation.   Thank you for this interesting consult.  I greatly enjoyed meeting Raliegh Scarlet  and look forward to participating in their care.  A copy of this report was sent to the requesting provider on this date.  Signed: D. Rowe Robert 11/10/2014, 8:18 AM   I spent a total of  30 minutes  in face to face in clinical consultation, greater than 50% of which was counseling/coordinating care for CT-guided percutaneous thermal ablation of right hepatic lobe [place]

## 2014-11-10 NOTE — Anesthesia Postprocedure Evaluation (Signed)
  Anesthesia Post-op Note  Patient: Phillip Warren  Procedure(s) Performed: Procedure(s) (LRB): LIVER MICROWAVE THERMAL ABLATION   (RADIOLOGY WITH ANESTHESIA) (N/A)  Patient Location: PACU  Anesthesia Type: General  Level of Consciousness: awake and alert   Airway and Oxygen Therapy: Patient Spontanous Breathing  Post-op Pain: mild  Post-op Assessment: Post-op Vital signs reviewed, Patient's Cardiovascular Status Stable, Respiratory Function Stable, Patent Airway and No signs of Nausea or vomiting  Last Vitals:  Filed Vitals:   11/10/14 1324  BP: 161/77  Pulse: 75  Temp: 36.4 C  Resp: 14    Post-op Vital Signs: stable   Complications: No apparent anesthesia complications

## 2014-11-10 NOTE — Procedures (Signed)
Interventional Radiology Procedure Note  Procedure:  CT guided thermal ablation and biopsy of right hepatic tumor  Anesthesia:  General  Complications:  None  Estimated Blood Loss: < 25 mL  2.2 cm right hepatic lesion localized by CT and [place].  Single NeuWave PR probe advanced into lesion. 18 G core biopsy x 2 via 17 G needle. Ablation performed at 65W for 12 total minutes. Tract cauterized. Plan:  PACU recovery followed by overnight observation.  Venetia Night. Kathlene Cote, M.D Pager:  [phone]

## 2014-11-11 ENCOUNTER — Observation Stay (HOSPITAL_COMMUNITY): Payer: Medicare Other

## 2014-11-11 DIAGNOSIS — C220 Liver cell carcinoma: Secondary | ICD-10-CM | POA: Diagnosis not present

## 2014-11-11 LAB — COMPREHENSIVE METABOLIC PANEL
ALBUMIN: 3.3 g/dL — AB (ref 3.5–5.0)
ALT: 119 U/L — AB (ref 17–63)
ANION GAP: 6 (ref 5–15)
AST: 152 U/L — ABNORMAL HIGH (ref 15–41)
Alkaline Phosphatase: 71 U/L (ref 38–126)
BUN: 6 mg/dL (ref 6–20)
CHLORIDE: 98 mmol/L — AB (ref 101–111)
CO2: 28 mmol/L (ref 22–32)
Calcium: 8 mg/dL — ABNORMAL LOW (ref 8.9–10.3)
Creatinine, Ser: 0.9 mg/dL (ref 0.61–1.24)
GFR calc non Af Amer: >60 mL/min (ref >60)
Glucose, Bld: 103 mg/dL — ABNORMAL HIGH (ref 65–99)
Potassium: 3.4 mmol/L — ABNORMAL LOW (ref 3.5–5.1)
SODIUM: 132 mmol/L — AB (ref 135–145)
Total Bilirubin: 1.6 mg/dL — ABNORMAL HIGH (ref 0.3–1.2)
Total Protein: 6.6 g/dL (ref 6.5–8.1)

## 2014-11-11 LAB — CBC
HCT: 38.1 % — ABNORMAL LOW (ref 39.0–52.0)
Hemoglobin: 12.6 g/dL — ABNORMAL LOW (ref 13.0–17.0)
MCH: 32.1 pg (ref 26.0–34.0)
MCHC: 33.1 g/dL (ref 30.0–36.0)
MCV: 97.2 fL (ref 78.0–100.0)
Platelets: 60 K/uL — ABNORMAL LOW (ref 150–400)
RBC: 3.92 MIL/uL — ABNORMAL LOW (ref 4.22–5.81)
RDW: 14.4 % (ref 11.5–15.5)
WBC: 4 K/uL (ref 4.0–10.5)

## 2014-11-11 NOTE — Progress Notes (Signed)
Nursing Discharge Summary  Patient ID: Phillip Warren MRN: [id number] DOB/AGE: [birth year] 59 y.o.  Admit date: 11/10/2014 Discharge date: 11/11/2014  Discharged Condition: good  Disposition: 01-Home or Self Care  Follow-up Information    Follow up with [facility] T, MD In 5 weeks.   Specialty:  Interventional Radiology   Why:  follow up with Dr Kathlene Cote 4-6 weeks; pt will hear from clinic with time and date   Contact information:   [address] [place] [address] [phone]       Follow up with Forest Gleason, MD.   Specialty:  Oncology   Contact information:   [place] [address] [phone]       Prescriptions Given: Prescription given for oxycodone.  Discharge instructions and medications reviewed with patient by Clotilde Dieter, RN.  Patient verbalized understanding  Means of Discharge: patient to be taken home via private vehicle.  Patient to ambulate downstairs.  Signed: Buel Ream 11/11/2014, 1:37 PM

## 2014-11-11 NOTE — Discharge Summary (Signed)
Patient ID: [name] MRN: [id number] DOB/AGE: Apr 04, 1955 59 y.o.  Admit date: 11/10/2014 Discharge date: 11/11/2014  Admission Diagnoses: Hepatocellular carcinoma                                          Rt Hepatic lesion  Discharge Diagnoses:  Active Problems:   Hepatocellular carcinoma [facility])   Discharged Condition: stable  Hospital Course: Hx Cirrhosis; Hep C; and Rt liver lesion meeting MRI criteria for Hepatocellular carcinoma Rt hepatic lesion thermal ablation with core biopsy performed in IR with Dr Kathlene Cote 11/10/14 Tolerated well Admitted overnight for observation. Has done well overnight. Eating well; slept well Denies N/V UOP great Still complains of abd pain at site of procedure--CT Abd today reveals No abnormality or complication of procedure. Ambulating well. I have seen and examined pt Discussed with Dr Pascal Lux Plan for discharge to home Follow up with Dr Kathlene Cote 4-6 weeks   Consults: None  Significant Diagnostic Studies: CT abdomen   Treatments: CT guided Rt hepatic lesion thermal ablation  Discharge Exam: Blood pressure 97/64, pulse 60, temperature 98 F (36.7 C), temperature source Oral, resp. rate 16, height 6' (1.829 m), weight 140 lb 6 oz (63.674 kg), SpO2 97 %.  PE: Afeb; A/O Appropriate Heart: RRR Lungs: CTA Abd: soft; Tender at and around ablation site No bleeding; no hematoma Ambulating well; FROM UOP great; clear yellow Results for orders placed or performed during the hospital encounter of 11/10/14  Comprehensive metabolic panel  Result Value Ref Range   Sodium 132 (L) 135 - 145 mmol/L   Potassium 3.4 (L) 3.5 - 5.1 mmol/L   Chloride 98 (L) 101 - 111 mmol/L   CO2 28 22 - 32 mmol/L   Glucose, Bld 103 (H) 65 - 99 mg/dL   BUN 6 6 - 20 mg/dL   Creatinine, Ser 0.90 0.61 - 1.24 mg/dL   Calcium 8.0 (L) 8.9 - 10.3 mg/dL   Total Protein 6.6 6.5 - 8.1 g/dL   Albumin 3.3 (L) 3.5 - 5.0 g/dL   AST 152 (H) 15 - 41 U/L   ALT 119  (H) 17 - 63 U/L   Alkaline Phosphatase 71 38 - 126 U/L   Total Bilirubin 1.6 (H) 0.3 - 1.2 mg/dL   GFR calc non Af Amer >60 >60 mL/min   GFR calc Af Amer >60 >60 mL/min   Anion gap 6 5 - 15  CBC  Result Value Ref Range   WBC 4.0 4.0 - 10.5 K/uL   RBC 3.92 (L) 4.22 - 5.81 MIL/uL   Hemoglobin 12.6 (L) 13.0 - 17.0 g/dL   HCT 38.1 (L) 39.0 - 52.0 %   MCV 97.2 78.0 - 100.0 fL   MCH 32.1 26.0 - 34.0 pg   MCHC 33.1 30.0 - 36.0 g/dL   RDW 14.4 11.5 - 15.5 %   Platelets 60 (L) 150 - 400 K/uL   IMAGING CT Abd 11/11/14: IMPRESSION: 1. Postprocedural changes of recent thermal ablation in the liver, with small amount of high attenuation in segment 8 of the liver at site of prior ablation, likely to represent a small amount of intraparenchymal hemorrhage. This does not extend to or beyond the capsular surface of the liver. No intraperitoneal hemorrhage identified on today's noncontrast CT of the abdomen.  Disposition: Rt hepatic lesion thermal ablation with Dr Kathlene Cote 11/10/2014 Overnight stay with continued abd pain--no other  issues Resume all Home meds Rx Oxycodone IR #20 To follow up with Dr Kathlene Cote in 4-6 weeks Follow with Dr Oliva Bustard Pt has good understanding of dc instructions   Discharge Instructions    Call MD for:  difficulty breathing, headache or visual disturbances    Complete by:  As directed      Call MD for:  extreme fatigue    Complete by:  As directed      Call MD for:  hives    Complete by:  As directed      Call MD for:  persistant dizziness or light-headedness    Complete by:  As directed      Call MD for:  persistant nausea and vomiting    Complete by:  As directed      Call MD for:  redness, tenderness, or signs of infection (pain, swelling, redness, odor or green/yellow discharge around incision site)    Complete by:  As directed      Call MD for:  severe uncontrolled pain    Complete by:  As directed      Call MD for:  temperature >100.4    Complete by:  As  directed      Diet - low sodium heart healthy    Complete by:  As directed      Discharge instructions    Complete by:  As directed   Follow up with Dr Kathlene Cote 4-6 weeks; pt will hear from clinic with time and date; call [phone] if questions     Discharge wound care:    Complete by:  As directed   May shower today; keep clean bandaid on site daily x 1 week     Driving Restrictions    Complete by:  As directed   No driving x 1 week     Increase activity slowly    Complete by:  As directed      Lifting restrictions    Complete by:  As directed   No lifting over 10 lbs x 1 week            Medication List    TAKE these medications        diphenhydrAMINE 25 MG tablet  Commonly known as:  BENADRYL  Take 1 tablet (25 mg total) by mouth every 6 (six) hours as needed.     gabapentin 400 MG capsule  Commonly known as:  NEURONTIN  Take 800 mg by mouth 3 (three) times daily.     nitroGLYCERIN 0.4 MG SL tablet  Commonly known as:  NITROSTAT  Place 0.4 mg under the tongue every 5 (five) minutes as needed for chest pain.           Follow-up Information    Follow up with [facility] T, MD In 5 weeks.   Specialty:  Interventional Radiology   Why:  follow up with Dr Kathlene Cote 4-6 weeks; pt will hear from clinic with time and date   Contact information:   [address] [place] [address] [phone]       Follow up with Forest Gleason, MD.   Specialty:  Oncology   Contact information:   [place] [address] [phone]        Signed: Lavonia Drafts 11/11/2014, 11:34 AM   I have spent Greater Than 30 Minutes discharging Phillip Warren.

## 2014-11-14 ENCOUNTER — Telehealth: Payer: Self-pay

## 2014-11-14 NOTE — Telephone Encounter (Signed)
  Oncology Nurse Navigator Documentation    Navigator Encounter Type: Telephone (11/14/14 1100) Patient Visit Type: Follow-up (11/14/14 1100) Treatment Phase:  (Post liver ablation) (11/14/14 1100)                  Time Spent with Patient: 15 (11/14/14 1100)   Post liver ablation. States he is doing well. Reports being more sore yesterday. Thinks he did to much activity yesterday. Encouraged him to follow his activity as instructed at discharge. He states he has ni needs at this time. Will continue to follow.

## 2014-11-15 ENCOUNTER — Other Ambulatory Visit: Payer: Self-pay | Admitting: *Deleted

## 2014-11-15 DIAGNOSIS — C220 Liver cell carcinoma: Secondary | ICD-10-CM

## 2014-11-28 ENCOUNTER — Inpatient Hospital Stay: Payer: Medicare Other | Admitting: Oncology

## 2014-11-28 ENCOUNTER — Inpatient Hospital Stay: Payer: Medicare Other | Attending: Oncology

## 2014-12-11 ENCOUNTER — Other Ambulatory Visit
Admission: RE | Admit: 2014-12-11 | Discharge: 2014-12-11 | Disposition: A | Discharge status: Home / Self Care | Payer: Medicare Other | Source: Ambulatory Visit | Attending: Interventional Radiology | Admitting: Interventional Radiology

## 2014-12-11 DIAGNOSIS — C220 Liver cell carcinoma: Secondary | ICD-10-CM | POA: Diagnosis present

## 2014-12-12 ENCOUNTER — Inpatient Hospital Stay: Payer: Medicare Other

## 2014-12-12 ENCOUNTER — Inpatient Hospital Stay: Payer: Medicare Other | Attending: Oncology | Admitting: Oncology

## 2014-12-12 LAB — AFP TUMOR MARKER: AFP-Tumor Marker: 14.4 ng/mL — ABNORMAL HIGH (ref 0.0–8.3)

## 2014-12-13 ENCOUNTER — Other Ambulatory Visit (HOSPITAL_COMMUNITY): Payer: Self-pay | Admitting: Interventional Radiology

## 2014-12-13 ENCOUNTER — Other Ambulatory Visit: Payer: Self-pay | Admitting: Radiology

## 2014-12-13 ENCOUNTER — Ambulatory Visit
Admission: RE | Admit: 2014-12-13 | Discharge: 2014-12-13 | Disposition: A | Discharge status: Home / Self Care | Payer: Medicare Other | Source: Ambulatory Visit | Attending: Radiology | Admitting: Radiology

## 2014-12-13 DIAGNOSIS — C220 Liver cell carcinoma: Secondary | ICD-10-CM

## 2014-12-13 DIAGNOSIS — I2510 Atherosclerotic heart disease of native coronary artery without angina pectoris: Secondary | ICD-10-CM

## 2014-12-13 DIAGNOSIS — K7460 Unspecified cirrhosis of liver: Secondary | ICD-10-CM

## 2014-12-13 DIAGNOSIS — R109 Unspecified abdominal pain: Secondary | ICD-10-CM

## 2014-12-13 DIAGNOSIS — K769 Liver disease, unspecified: Secondary | ICD-10-CM

## 2014-12-13 NOTE — H&P (Signed)
Referring Physician(s): Dr. Oliva Bustard   Chief Complaint: The patient is seen in follow up today s/p successful CT guided biopsy and thermal ablation of right hepatic tumor and biopsy revealing [place] on 11/10/2014  History of present illness: Phillip Warren is a 59 y.o. male with a history of chronic hepatitis B/C infection, alcoholism and cirrhosis followed by Dr. Dorise Bullion of Gastroenterology and Dr. Glendon Axe. CT of the abdomen with contrast on 08/08/2014 revealed a new hypervascular central right lobe lesion in segment 8 measuring 2.1 x 1.8 cm. Laboratory tests at that time demonstrated elevated AFP level of 482.4. Liver function tests were essentially normal with only mild elevation of AST. Further imaging with MRI on 08/11/2014 confirmed the presence of a hypervascular mass estimated to be approximately 2.0 x 1.6 cm by MRI and showing MRI characteristics meeting AASLD imaging criteria for the diagnosis of hepatocellular carcinoma.  He is s/p CT guided biopsy and thermal ablation of right hepatic tumor and biopsy revealing Phillip Warren on 11/10/2014. He has been doing well since discharge on 11/11/2014. He denies any fever or chills. He denies any active bleeding. He denies any confusion, lightheadedness or dizziness. He denies any chest pain or change in chronic shortness of breath. He does admit to some abdominal pain just right to epigastric region which he has been taking ibuprofen 800 mg that he had at home with some relief. He also complains of "stinging" sensation in his feet bilaterally.    Past Medical History  Diagnosis Date  . Neuropathy ([place])   . CHF (congestive heart failure) ([place])   . Cocaine abuse   . Marijuana use   . Myocardial infarction ([place])     massive MI at age 5  . COPD (chronic obstructive pulmonary disease) ([place])   . Shortness of breath dyspnea   . Pneumonia   . Anxiety     trouble sleeping; lost two sisters and his mother close together  . Anorexia     has  trouble eating due to poor appetite  . Headache   . Arthritis     hands, feet (burning, stinging)  . Coronary artery disease   . Cancer [facility])     liver lesion initial staging.  . Hepatitis     B and C (chronic)  . Cirrhosis of liver ([place])   . Numbness and tingling     legs and feet bilat   . Tinnitus   . H/O dizziness   . Repeated falls   . GERD (gastroesophageal reflux disease)   . Tobacco abuse   . Alcohol abuse     Past Surgical History  Procedure Laterality Date  . Cardiac surgery    . Colonoscopy with propofol N/A 08/31/2014    Procedure: COLONOSCOPY WITH PROPOFOL;  Surgeon: Josefine Class, MD;  Location: [facility] ENDOSCOPY;  Service: Endoscopy;  Laterality: N/A;  . Esophagogastroduodenoscopy (egd) with propofol N/A 08/31/2014    Procedure: ESOPHAGOGASTRODUODENOSCOPY (EGD) WITH PROPOFOL;  Surgeon: Josefine Class, MD;  Location: [facility] ENDOSCOPY;  Service: Endoscopy;  Laterality: N/A;  . Coronary angioplasty      at age 69 secondary to massive MI    Allergies: Multihance; Contrast media; Hydrocodone; and Vicodin Patient is allergic to gadolinium.   Medications: Prior to Admission medications   Medication Sig Start Date End Date Taking? Authorizing Provider  gabapentin (NEURONTIN) 400 MG capsule Take 800 mg by mouth 3 (three) times daily.    Yes Historical Provider, MD  nitroGLYCERIN (NITROSTAT) 0.4 MG  SL tablet Place 0.4 mg under the tongue every 5 (five) minutes as needed for chest pain.  05/10/13  Yes Historical Provider, MD  diphenhydrAMINE (BENADRYL) 25 MG tablet Take 1 tablet (25 mg total) by mouth every 6 (six) hours as needed. Patient not taking: Reported on 10/12/2014 08/11/14   Carrie Mew, MD     No family history on file.  Social History   Social History  . Marital Status: Widowed    Spouse Name: N/A  . Number of Children: N/A  . Years of Education: N/A   Social History Main Topics  . Smoking status: Current Every Day Smoker -- 0.50 packs/day for  30 years    Types: Cigarettes  . Smokeless tobacco: Never Used  . Alcohol Use: Yes     Comment: Daily  . Drug Use: Yes    Special: Marijuana, Cocaine     Comment: pt states has not used cocaine in 20 years   . Sexual Activity: Not Asked   Other Topics Concern  . None   Social History Narrative     Vital Signs: BP 128/89 mmHg  Pulse 82  Temp(Src) 98.2 F (36.8 C) (Oral)  Resp 14  Ht 6' (1.829 m)  Wt 129 lb (58.514 kg)  BMI 17.49 kg/m2  SpO2 99%  Physical Exam  Constitutional: He is oriented to person, place, and time. No distress.  HENT:  Head: Normocephalic and atraumatic.  Cardiovascular: Normal rate and regular rhythm.  Exam reveals no gallop and no friction rub.   No murmur heard. Pulmonary/Chest: Effort normal and breath sounds normal. No respiratory distress. He has no wheezes. He has no rales.  Abdominal: Soft. Bowel sounds are normal. He exhibits no distension and no mass. There is tenderness.  TTP just right of epigastric region.  Musculoskeletal: He exhibits no edema.  Neurological: He is alert and oriented to person, place, and time.  Skin: Skin is warm and dry. He is not diaphoretic.  Psychiatric: He has a normal mood and affect. His behavior is normal. Judgment and thought content normal.    Imaging: No results found.  Labs:  CBC:  Recent Labs  05/14/14 1606 08/24/14 1205 11/09/14 1000 11/11/14 0554  WBC 5.8 4.6 3.8* 4.0  HGB 16.4 15.2 15.4 12.6*  HCT 49.7 45.5 43.5 38.1*  PLT 111* 164 127* 60*    COAGS:  Recent Labs  11/09/14 1000  INR 1.07  APTT 29    BMP:  Recent Labs  05/14/14 1606 08/24/14 1205 11/09/14 1000 11/11/14 0554  NA 135 131* 134* 132*  K 4.2 4.4 5.0 3.4*  CL 99* 99* 100* 98*  CO2 26 24 27 28   GLUCOSE 109* 82 90 103*  BUN 17 10 8 6   CALCIUM 9.0 8.5* 9.0 8.0*  CREATININE 1.14 0.99 1.14 0.90  GFRNONAA >60 >60 >60 >60  GFRAA >60 >60 >60 >60    LIVER FUNCTION TESTS:  Recent Labs  05/14/14 1606  08/24/14 1205 11/09/14 1000 11/11/14 0554  BILITOT 0.7 0.8 1.6* 1.6*  AST 162* 67* 232* 152*  ALT 184* 61 169* 119*  ALKPHOS 101 80 93 71  PROT 8.8* 8.9* 7.9 6.6  ALBUMIN 3.9 4.1 4.1 3.3*    Assessment: Chronic hepatitis B/C infection Alcoholism  Cirrhosis  AFP level of 482.4 and MRI on 08/11/2014 confirmed the presence of a hypervascular mass estimated to be approximately 2.0 x 1.6 cm by MRI that meets AASLD imaging criteria for the diagnosis of hepatocellular carcinoma. S/p successful CT  guided biopsy and thermal ablation of right hepatic tumor and biopsy revealing [place] on 11/10/2014 Repeat AFP on 12/11/2014 is 14.1 down from 482.4, some continued abdominal pain, recommended to get OTC ibuprofen and take with plenty of food and water for 1-2 weeks and if no improvement contact our office. Education given on risks of high dose ibuprofen such as 800 mg that he is currently taking. We will see him back in clinic and order a CT abdomen with and without (Liver protocol) next month to evaluate ablation zone and order AFP at that time. Patient had a severe allergic reaction to Gadolinium.  Scheduled the patient follow-up with Dr. Oliva Bustard today for 12/28/2014- patient previously missed last 2 appointments   Signed: Hedy Jacob 12/13/2014, 11:11 AM   Please refer to Dr. Kathlene Cote attestation of this note for management and plan.

## 2014-12-14 DIAGNOSIS — K7460 Unspecified cirrhosis of liver: Secondary | ICD-10-CM | POA: Insufficient documentation

## 2014-12-28 ENCOUNTER — Inpatient Hospital Stay: Payer: Medicare Other | Attending: Oncology | Admitting: Oncology

## 2015-01-03 ENCOUNTER — Other Ambulatory Visit: Payer: Medicare Other

## 2015-01-03 ENCOUNTER — Ambulatory Visit (HOSPITAL_COMMUNITY): Admission: RE | Admit: 2015-01-03 | Payer: Medicare Other | Source: Ambulatory Visit

## 2015-05-01 ENCOUNTER — Emergency Department
Admission: EM | Admit: 2015-05-01 | Discharge: 2015-05-01 | Disposition: A | Discharge status: Home / Self Care | Payer: Medicare Other | Attending: Emergency Medicine | Admitting: Emergency Medicine

## 2015-05-01 DIAGNOSIS — I509 Heart failure, unspecified: Secondary | ICD-10-CM | POA: Insufficient documentation

## 2015-05-01 DIAGNOSIS — I2510 Atherosclerotic heart disease of native coronary artery without angina pectoris: Secondary | ICD-10-CM | POA: Diagnosis not present

## 2015-05-01 DIAGNOSIS — F17210 Nicotine dependence, cigarettes, uncomplicated: Secondary | ICD-10-CM | POA: Diagnosis not present

## 2015-05-01 DIAGNOSIS — T401X1A Poisoning by heroin, accidental (unintentional), initial encounter: Secondary | ICD-10-CM | POA: Diagnosis not present

## 2015-05-01 DIAGNOSIS — I252 Old myocardial infarction: Secondary | ICD-10-CM | POA: Insufficient documentation

## 2015-05-01 DIAGNOSIS — Z8505 Personal history of malignant neoplasm of liver: Secondary | ICD-10-CM | POA: Insufficient documentation

## 2015-05-01 DIAGNOSIS — J449 Chronic obstructive pulmonary disease, unspecified: Secondary | ICD-10-CM | POA: Diagnosis not present

## 2015-05-01 NOTE — ED Provider Notes (Addendum)
[facility] Emergency Department Provider Note  ____________________________________________  Time seen: 10:15 PM  I have reviewed the triage vital signs and the nursing notes.   HISTORY  Chief Complaint Drug Overdose    HPI Phillip Warren is a 60 y.o. male brought to the ED by EMS due to an apparent heroin overdose. Patient was found unresponsive on a bed breathing 4 times a minute with pinpoint pupils with drug paraphernalia all around. He was given intranasal Narcan with return of mental status and patient is awake and alert on arrival to the emergency department. He readily admits to using intravenous heroin today. He states this is the first time he ever used. Denies any chest pain or shortness of breath. No nausea vomiting diarrhea. Has some chronic abdominal pain owing to hepatitis C. Also complains of chronic neuropathy of the lower legs. No acute symptoms at the present time, refuses any additional workup, wishes to be discharged right away.     Past Medical History  Diagnosis Date  . Neuropathy ([place])   . CHF (congestive heart failure) ([place])   . Cocaine abuse   . Marijuana use   . Myocardial infarction ([place])     massive MI at age 55  . COPD (chronic obstructive pulmonary disease) ([place])   . Shortness of breath dyspnea   . Pneumonia   . Anxiety     trouble sleeping; lost two sisters and his mother close together  . Anorexia     has trouble eating due to poor appetite  . Headache   . Arthritis     hands, feet (burning, stinging)  . Coronary artery disease   . Cancer [facility])     liver lesion initial staging.  . Hepatitis     B and C (chronic)  . Cirrhosis of liver ([place])   . Numbness and tingling     legs and feet bilat   . Tinnitus   . H/O dizziness   . Repeated falls   . GERD (gastroesophageal reflux disease)   . Tobacco abuse   . Alcohol abuse      Patient Active Problem List   Diagnosis Date Noted  . Hepatic cirrhosis ([place])    . Hepatocellular carcinoma ([place])   . [place] (hepatocellular carcinoma) ([place])   . Hepatitis, viral   . Cancer, hepatocellular ([place]) 08/31/2014  . Absolute anemia 08/26/2014  . Cirrhosis ([place]) 08/26/2014  . Arteriosclerosis of coronary artery 08/26/2014  . HBV (hepatitis B virus) infection 08/26/2014  . HCV (hepatitis C virus) 08/26/2014  . Chronic hepatitis C ([place]) 04/13/2014  . Foot pain 04/13/2014     Past Surgical History  Procedure Laterality Date  . Cardiac surgery    . Colonoscopy with propofol N/A 08/31/2014    Procedure: COLONOSCOPY WITH PROPOFOL;  Surgeon: Josefine Class, MD;  Location: [facility] ENDOSCOPY;  Service: Endoscopy;  Laterality: N/A;  . Esophagogastroduodenoscopy (egd) with propofol N/A 08/31/2014    Procedure: ESOPHAGOGASTRODUODENOSCOPY (EGD) WITH PROPOFOL;  Surgeon: Josefine Class, MD;  Location: [facility] ENDOSCOPY;  Service: Endoscopy;  Laterality: N/A;  . Coronary angioplasty      at age 74 secondary to massive MI     Current Outpatient Rx  Name  Route  Sig  Dispense  Refill  . diphenhydrAMINE (BENADRYL) 25 MG tablet   Oral   Take 1 tablet (25 mg total) by mouth every 6 (six) hours as needed. Patient not taking: Reported on 10/12/2014   30 tablet   0   .  gabapentin (NEURONTIN) 400 MG capsule   Oral   Take 800 mg by mouth 3 (three) times daily.          . nitroGLYCERIN (NITROSTAT) 0.4 MG SL tablet   Sublingual   Place 0.4 mg under the tongue every 5 (five) minutes as needed for chest pain.             Allergies Multihance; Hydrocodone; and Vicodin   No family history on file.  Social History Social History  Substance Use Topics  . Smoking status: Current Every Day Smoker -- 0.50 packs/day for 30 years    Types: Cigarettes  . Smokeless tobacco: Never Used  . Alcohol Use: Yes     Comment: Daily    Review of Systems  Constitutional:   No fever or chills. No weight changes Eyes:   No vision changes.  ENT:   No sore throat. No  rhinorrhea. Cardiovascular:   No chest pain. Respiratory:   No dyspnea or cough. Gastrointestinal:   Chronic abdominal pain without vomiting or diarrhea..  No BRBPR or melena. Genitourinary:   Negative for dysuria or difficulty urinating. Musculoskeletal:   Negative for focal pain or swelling Skin:   Negative for rash. Neurological:   Negative for headaches, focal weakness or numbness.  10-point ROS otherwise negative.  ____________________________________________   PHYSICAL EXAM:  VITAL SIGNS: ED Triage Vitals  Enc Vitals Group     BP 05/01/15 2158 161/110 mmHg     Pulse Rate 05/01/15 2158 113     Resp 05/01/15 2158 18     Temp 05/01/15 2158 97.8 F (36.6 C)     Temp Source 05/01/15 2158 Oral     SpO2 05/01/15 2158 96 %     Weight 05/01/15 2158 155 lb (70.308 kg)     Height 05/01/15 2158 6' (1.829 m)     Head Cir --      Peak Flow --      Pain Score --      Pain Loc --      Pain Edu? --      Excl. in [place]? --     Vital signs reviewed, nursing assessments reviewed.   Constitutional:   Alert and oriented. Well appearing and in no distress. Eyes:   No scleral icterus. No conjunctival pallor. PERRL. EOMI ENT   Head:   Normocephalic and atraumatic.   Nose:   No congestion/rhinnorhea. No septal hematoma   Mouth/Throat:   MMM, no pharyngeal erythema. No peritonsillar mass.    Neck:   No stridor. No SubQ emphysema. No meningismus. Hematological/Lymphatic/Immunilogical:   No cervical lymphadenopathy. Cardiovascular:   Tachycardia heart rate 110. Symmetric bilateral radial and DP pulses.  No murmurs.  Respiratory:   Normal respiratory effort without tachypnea nor retractions. Breath sounds are clear and equal bilaterally. No wheezes/rales/rhonchi. Gastrointestinal:   Soft with right-sided tenderness. Non distended. There is no CVA tenderness.  No rebound, rigidity, or guarding. Genitourinary:   deferred Musculoskeletal:   Nontender with normal range of motion in  all extremities. No joint effusions.  No lower extremity tenderness.  No edema. Neurologic:   Normal speech and language.  CN 2-10 normal. Motor grossly intact. No gross focal neurologic deficits are appreciated.  Skin:    Skin is warm, dry and intact. No rash noted.  No petechiae, purpura, or bullae.Multiple track marks on the upper extremities. Skin puncture and injection site in the right antecubital fossa. Psychiatric:   Mood and affect are normal. No SI  or HI, no hallucinations ____________________________________________    LABS (pertinent positives/negatives) (all labs ordered are listed, but only abnormal results are displayed) Labs Reviewed - No data to display ____________________________________________   EKG    ____________________________________________    RADIOLOGY    ____________________________________________   PROCEDURES   ____________________________________________   INITIAL IMPRESSION / ASSESSMENT AND PLAN / ED COURSE  Pertinent labs & imaging results that were available during my care of the patient were reviewed by me and considered in my medical decision making (see chart for details).  Patient presents after an apparent heroin overdose, corrected by Narcan on scene by first responders. Patient refuses any additional workup. I encouraged him to have labs and IV fluids for his tachycardia and belly pain given his severe comorbidities and prior medical history he refuses. He does express worry that police will, and I did try to reassure him that we do not report his medical information to police but he refuses any additional workup. I did advise them that he is welcome to return to the emergency department at any time if he so chooses. At this time the patient chose to elope from the emergency department before any further discussion or before discharge instructions could be prepared.     ____________________________________________   FINAL  CLINICAL IMPRESSION(S) / ED DIAGNOSES  Final diagnoses:  Heroin overdose, accidental or unintentional, initial encounter  Sinus tachycardia    Carrie Mew, MD 05/01/15 [place], MD 05/01/15 2239

## 2015-05-01 NOTE — ED Notes (Signed)
Per EMS, patient comes from home. EMS was called for cardiac arrest. There, they found patient on the floor breathing 4-6bpm. Patient was given 1 dose of Narcan and "came to". Patient is hep C positive. Patient is A&O x4. Patient states, "This is because no one gives me pain medication. It was the first time I did heroin. Everyone OD's on heroin because no one can get pain medication". Patient denies pain. Denies N/V/D and CP.

## 2015-05-01 NOTE — ED Notes (Signed)
Patient left AMA. Explained benefits and risk to patient. Still willing to leave. MD aware. Patient signed AMA form. Instructed to come back to ED for further complications.

## 2015-05-17 ENCOUNTER — Other Ambulatory Visit: Payer: Self-pay | Admitting: *Deleted

## 2015-05-17 DIAGNOSIS — C220 Liver cell carcinoma: Secondary | ICD-10-CM

## 2015-06-26 ENCOUNTER — Other Ambulatory Visit: Payer: Self-pay | Admitting: Radiology

## 2015-06-26 ENCOUNTER — Telehealth: Payer: Self-pay | Admitting: Radiology

## 2015-06-26 DIAGNOSIS — R160 Hepatomegaly, not elsewhere classified: Secondary | ICD-10-CM

## 2015-06-26 NOTE — Telephone Encounter (Signed)
Left msg on M# voice mail to remind patient to have labs drawn today at [facility].  Also, included information for CT & follow up appointment which are scheduled for tomorrow.  Sharnise Blough Riki Rusk, RN 06/26/2015 9:27 AM

## 2015-06-27 ENCOUNTER — Inpatient Hospital Stay: Admission: RE | Admit: 2015-06-27 | Payer: Medicare Other | Source: Ambulatory Visit

## 2015-06-27 ENCOUNTER — Ambulatory Visit (HOSPITAL_COMMUNITY): Admission: RE | Admit: 2015-06-27 | Payer: Medicare Other | Source: Ambulatory Visit

## 2015-07-10 ENCOUNTER — Encounter: Payer: Self-pay | Admitting: Radiology

## 2015-08-15 ENCOUNTER — Encounter: Payer: Self-pay | Admitting: Radiology

## 2015-10-02 ENCOUNTER — Other Ambulatory Visit: Payer: Self-pay | Admitting: Student

## 2015-10-02 DIAGNOSIS — B182 Chronic viral hepatitis C: Secondary | ICD-10-CM

## 2015-10-02 DIAGNOSIS — K7030 Alcoholic cirrhosis of liver without ascites: Secondary | ICD-10-CM

## 2015-10-05 ENCOUNTER — Ambulatory Visit
Admission: RE | Admit: 2015-10-05 | Discharge: 2015-10-05 | Disposition: A | Discharge status: Home / Self Care | Payer: Medicare Other | Source: Ambulatory Visit | Attending: Student | Admitting: Student

## 2016-08-19 ENCOUNTER — Ambulatory Visit (INDEPENDENT_AMBULATORY_CARE_PROVIDER_SITE_OTHER): Payer: Medicare Other | Admitting: Urology

## 2016-08-19 ENCOUNTER — Encounter: Payer: Self-pay | Admitting: Urology

## 2016-08-19 VITALS — BP 106/69 | HR 96 | Ht 72.0 in | Wt 143.0 lb

## 2016-08-19 DIAGNOSIS — N400 Enlarged prostate without lower urinary tract symptoms: Secondary | ICD-10-CM | POA: Diagnosis not present

## 2016-08-19 DIAGNOSIS — R39198 Other difficulties with micturition: Secondary | ICD-10-CM | POA: Diagnosis not present

## 2016-08-19 LAB — URINALYSIS, COMPLETE
BILIRUBIN UA: NEGATIVE
Glucose, UA: NEGATIVE
Ketones, UA: NEGATIVE
Leukocytes, UA: NEGATIVE
Nitrite, UA: NEGATIVE
PH UA: 7 (ref 5.0–7.5)
PROTEIN UA: NEGATIVE
RBC UA: NEGATIVE
Specific Gravity, UA: <1.005 — ABNORMAL LOW (ref 1.005–1.030)
UUROB: 1 mg/dL (ref 0.2–1.0)

## 2016-08-19 LAB — BLADDER SCAN AMB NON-IMAGING

## 2016-08-19 LAB — MICROSCOPIC EXAMINATION
Bacteria, UA: NONE SEEN
RBC, UA: NONE SEEN /hpf (ref 0–?)

## 2016-08-19 MED ORDER — SILDENAFIL CITRATE 20 MG PO TABS: 20.0000 mg | ORAL_TABLET | ORAL | 0 refills | Status: DC | PRN

## 2016-08-19 MED ORDER — TAMSULOSIN HCL 0.4 MG PO CAPS: 0.4000 mg | ORAL_CAPSULE | Freq: Every day | ORAL | 11 refills | Status: DC

## 2016-08-19 NOTE — Progress Notes (Signed)
08/19/2016 3:02 PM   Phillip Warren 22-Nov-1955 [id number]  Referring provider: Glendon Axe, MD [place] [facility] [place], [place] [address]  Chief Complaint  Patient presents with  . Erectile Dysfunction    New Patient  . Urinary Retention    HPI: 61 year old male who presents today for further management and evaluation of erectile dysfunction and difficulty voiding.  The patient states that he has a strong libido, but is unable to obtain an erection. This is been present for at least the last 8 years. The patient has not tried any medications or treatments for this. He desires help. The patient has a history of myocardial infarction at the age of 28. He has had nitroglycerin in his pocket for the last 25 years, he is only used it once, approximately 20 years ago.  Condition, the patient describes nocturia, weak stream, and the feeling of incomplete bladder emptying, and straining to void. He is not taking any medications for this. The symptoms of been slowly progressive. He denies a history of recurrent urinary tract infections.       PMH: Past Medical History:  Diagnosis Date  . Alcohol abuse   . Anorexia    has trouble eating due to poor appetite  . Anxiety    trouble sleeping; lost two sisters and his mother close together  . Arthritis    hands, feet (burning, stinging)  . Cancer [facility])    liver lesion initial staging.  . CHF (congestive heart failure) ([place])   . Cirrhosis of liver ([place])   . Cocaine abuse   . COPD (chronic obstructive pulmonary disease) ([place])   . Coronary artery disease   . GERD (gastroesophageal reflux disease)   . H/O dizziness   . Headache   . Hepatitis    B and C (chronic)  . Marijuana use   . Myocardial infarction ([place])    massive MI at age 68  . Neuropathy   . Numbness and tingling    legs and feet bilat   . Pneumonia   . Repeated falls   . Shortness of breath dyspnea   . Tinnitus   . Tobacco abuse      Surgical History: Past Surgical History:  Procedure Laterality Date  . CARDIAC SURGERY    . COLONOSCOPY WITH PROPOFOL N/A 08/31/2014   Procedure: COLONOSCOPY WITH PROPOFOL;  Surgeon: Josefine Class, MD;  Location: [facility] ENDOSCOPY;  Service: Endoscopy;  Laterality: N/A;  . CORONARY ANGIOPLASTY     at age 76 secondary to massive MI  . ESOPHAGOGASTRODUODENOSCOPY (EGD) WITH PROPOFOL N/A 08/31/2014   Procedure: ESOPHAGOGASTRODUODENOSCOPY (EGD) WITH PROPOFOL;  Surgeon: Josefine Class, MD;  Location[facility] ENDOSCOPY;  Service: Endoscopy;  Laterality: N/A;    Home Medications:  Allergies as of 08/19/2016      Reactions   Multihance [gadobenate] Hives, Shortness Of Breath   Hydrocodone Itching, Nausea And Vomiting   Vicodin [hydrocodone-acetaminophen] Nausea Only, Nausea And Vomiting   Vomiting      Medication List       Accurate as of 08/19/16  3:02 PM. Always use your most recent med list.          diphenhydrAMINE 25 MG tablet Commonly known as:  BENADRYL Take 1 tablet (25 mg total) by mouth every 6 (six) hours as needed.   gabapentin 400 MG capsule Commonly known as:  NEURONTIN Take 800 mg by mouth 3 (three) times daily.   nitroGLYCERIN 0.4 MG SL tablet Commonly known  as:  NITROSTAT Place 0.4 mg under the tongue every 5 (five) minutes as needed for chest pain.   sildenafil 20 MG tablet Commonly known as:  REVATIO Take 1 tablet (20 mg total) by mouth as needed (take 2-5 tablets as needed for sexual activity).   tamsulosin 0.4 MG Caps capsule Commonly known as:  FLOMAX Take 1 capsule (0.4 mg total) by mouth daily.       Allergies:  Allergies  Allergen Reactions  . Multihance [Gadobenate] Hives and Shortness Of Breath  . Hydrocodone Itching and Nausea And Vomiting  . Vicodin [Hydrocodone-Acetaminophen] Nausea Only and Nausea And Vomiting    Vomiting     Family History: Family History  Problem Relation Age of Onset  . Prostate cancer Neg Hx   . Bladder  Cancer Neg Hx   . Kidney cancer Neg Hx     Social History:  reports that he has been smoking Cigarettes.  He has a 15.00 pack-year smoking history. He has never used smokeless tobacco. He reports that he drinks alcohol. He reports that he uses drugs, including Marijuana and Cocaine.  ROS: UROLOGY Frequent Urination?: Yes Hard to postpone urination?: Yes Burning/pain with urination?: No Get up at night to urinate?: Yes Leakage of urine?: No Urine stream starts and stops?: Yes Trouble starting stream?: Yes Do you have to strain to urinate?: Yes Blood in urine?: No Urinary tract infection?: No Sexually transmitted disease?: No Injury to kidneys or bladder?: No Painful intercourse?: No Weak stream?: No Erection problems?: Yes Penile pain?: No  Gastrointestinal Nausea?: Yes Vomiting?: Yes Indigestion/heartburn?: Yes Diarrhea?: No Constipation?: No  Constitutional Fever: No Night sweats?: Yes Weight loss?: Yes Fatigue?: Yes  Skin Skin rash/lesions?: Yes Itching?: Yes  Eyes Blurred vision?: Yes Double vision?: No  Ears/Nose/Throat Sore throat?: No Sinus problems?: Yes  Hematologic/Lymphatic Swollen glands?: No Easy bruising?: Yes  Cardiovascular Leg swelling?: No Chest pain?: No  Respiratory Cough?: No Shortness of breath?: Yes  Endocrine Excessive thirst?: No  Musculoskeletal Back pain?: Yes Joint pain?: Yes  Neurological Headaches?: Yes Dizziness?: Yes  Psychologic Depression?: Yes Anxiety?: Yes  Physical Exam: BP 106/69 (BP Location: Left Arm, Patient Position: Sitting, Cuff Size: Normal)   Pulse 96   Ht 6' (1.829 m)   Wt 64.9 kg (143 lb)   BMI 19.39 kg/m   Constitutional:  Alert and oriented, No acute distress. HEENT: [place] AT, moist mucus membranes.  Trachea midline, no masses. Cardiovascular: No clubbing, cyanosis, or edema. Respiratory: Normal respiratory effort, no increased work of breathing. GI: Abdomen is soft, nontender,  nondistended, no abdominal masses GU: No CVA tenderness.  DRE: Prominent bilateral lateral lobes with high ridges, no nodules, symmetric. Approximately 35 g. Skin: No rashes, bruises or suspicious lesions. Lymph: No cervical or inguinal adenopathy. Neurologic: Grossly intact, no focal deficits, moving all 4 extremities. Psychiatric: Normal mood and affect.  Laboratory Data: Lab Results  Component Value Date   WBC 4.0 11/11/2014   HGB 12.6 (L) 11/11/2014   HCT 38.1 (L) 11/11/2014   MCV 97.2 11/11/2014   PLT 60 (L) 11/11/2014    Lab Results  Component Value Date   CREATININE 0.90 11/11/2014    No results found for: PSA  No results found for: TESTOSTERONE  No results found for: HGBA1C  Urinalysis    Component Value Date/Time   COLORURINE Yellow 05/14/2014 1606   APPEARANCEUR Clear 08/19/2016 0949   LABSPEC 1.019 05/14/2014 1606   PHURINE 6.0 05/14/2014 1606   GLUCOSEU Negative 08/19/2016 0949  GLUCOSEU Negative 05/14/2014 1606   HGBUR Negative 05/14/2014 1606   BILIRUBINUR Negative 08/19/2016 0949   BILIRUBINUR Negative 05/14/2014 1606   KETONESUR Negative 05/14/2014 1606   PROTEINUR Negative 08/19/2016 0949   PROTEINUR Negative 05/14/2014 1606   NITRITE Negative 08/19/2016 0949   NITRITE Negative 05/14/2014 1606   LEUKOCYTESUR Negative 08/19/2016 0949   LEUKOCYTESUR Negative 05/14/2014 1606    Pertinent Imaging: None   Assessment & Plan:  The patient has erectile dysfunction, long-standing, likely an arterial problem. We discussed the treatment options. The patient would like to try medications. I told him this is contraindicated with the use of nitroglycerin. He assured me that he does not use his nitroglycerin.  I explained to him the risk of vascular collapse in the event that both of these medications are used to gather. The patient seemed to understand the risk, and was willing to proceed with this. As such, I wrote a prescription for sildenafil to-5  tablets/day, when necessary. In addition, written a prescription for the patient to try tamsulosin. We will plan to have the patient return in 1 month at which point we will obtain a PSA prior.  1. Difficulty urinating  - Urinalysis, Complete - BLADDER SCAN AMB NON-IMAGING  2. Benign prostatic hyperplasia, unspecified whether lower urinary tract symptoms present  - PSA; Future   Return for 1 month w/PSA prior.  Ardis Hughs, [facility] [address], [place], [place] [address] [phone]

## 2016-09-16 ENCOUNTER — Other Ambulatory Visit: Payer: Medicare Other

## 2016-09-16 ENCOUNTER — Encounter: Payer: Self-pay | Admitting: Urology

## 2016-09-18 ENCOUNTER — Ambulatory Visit: Payer: Medicare Other | Admitting: Urology

## 2016-09-18 ENCOUNTER — Encounter: Payer: Self-pay | Admitting: Urology

## 2017-01-02 ENCOUNTER — Inpatient Hospital Stay: Payer: Medicare Other | Admitting: Anesthesiology

## 2017-01-02 ENCOUNTER — Emergency Department: Payer: Medicare Other

## 2017-01-02 ENCOUNTER — Encounter
Admission: EM | Disposition: A | Discharge status: Home / Self Care | Payer: Self-pay | Source: Home / Self Care | Attending: Internal Medicine

## 2017-01-02 ENCOUNTER — Encounter: Payer: Self-pay | Admitting: Emergency Medicine

## 2017-01-02 ENCOUNTER — Other Ambulatory Visit: Payer: Self-pay

## 2017-01-02 ENCOUNTER — Inpatient Hospital Stay: Payer: Medicare Other

## 2017-01-02 ENCOUNTER — Inpatient Hospital Stay
Admission: EM | Admit: 2017-01-02 | Discharge: 2017-01-03 | DRG: 432 | Disposition: A | Discharge status: Home / Self Care | Payer: Medicare Other | Attending: Internal Medicine | Admitting: Internal Medicine

## 2017-01-02 DIAGNOSIS — I509 Heart failure, unspecified: Secondary | ICD-10-CM | POA: Diagnosis present

## 2017-01-02 DIAGNOSIS — K3189 Other diseases of stomach and duodenum: Secondary | ICD-10-CM | POA: Diagnosis present

## 2017-01-02 DIAGNOSIS — F419 Anxiety disorder, unspecified: Secondary | ICD-10-CM | POA: Diagnosis present

## 2017-01-02 DIAGNOSIS — I2510 Atherosclerotic heart disease of native coronary artery without angina pectoris: Secondary | ICD-10-CM | POA: Diagnosis present

## 2017-01-02 DIAGNOSIS — Z8505 Personal history of malignant neoplasm of liver: Secondary | ICD-10-CM | POA: Diagnosis not present

## 2017-01-02 DIAGNOSIS — E861 Hypovolemia: Secondary | ICD-10-CM | POA: Diagnosis present

## 2017-01-02 DIAGNOSIS — I252 Old myocardial infarction: Secondary | ICD-10-CM

## 2017-01-02 DIAGNOSIS — K766 Portal hypertension: Secondary | ICD-10-CM | POA: Diagnosis present

## 2017-01-02 DIAGNOSIS — F17210 Nicotine dependence, cigarettes, uncomplicated: Secondary | ICD-10-CM | POA: Diagnosis present

## 2017-01-02 DIAGNOSIS — K219 Gastro-esophageal reflux disease without esophagitis: Secondary | ICD-10-CM | POA: Diagnosis present

## 2017-01-02 DIAGNOSIS — K921 Melena: Secondary | ICD-10-CM | POA: Diagnosis present

## 2017-01-02 DIAGNOSIS — Z9861 Coronary angioplasty status: Secondary | ICD-10-CM | POA: Diagnosis not present

## 2017-01-02 DIAGNOSIS — I8511 Secondary esophageal varices with bleeding: Secondary | ICD-10-CM | POA: Diagnosis present

## 2017-01-02 DIAGNOSIS — J449 Chronic obstructive pulmonary disease, unspecified: Secondary | ICD-10-CM | POA: Diagnosis present

## 2017-01-02 DIAGNOSIS — K7030 Alcoholic cirrhosis of liver without ascites: Secondary | ICD-10-CM | POA: Diagnosis present

## 2017-01-02 DIAGNOSIS — K922 Gastrointestinal hemorrhage, unspecified: Secondary | ICD-10-CM | POA: Diagnosis present

## 2017-01-02 DIAGNOSIS — R109 Unspecified abdominal pain: Secondary | ICD-10-CM

## 2017-01-02 HISTORY — PX: ESOPHAGOGASTRODUODENOSCOPY (EGD) WITH PROPOFOL: SHX5813

## 2017-01-02 LAB — COMPREHENSIVE METABOLIC PANEL
ALK PHOS: 80 U/L (ref 38–126)
ALT: 51 U/L (ref 17–63)
ANION GAP: 10 (ref 5–15)
AST: 58 U/L — ABNORMAL HIGH (ref 15–41)
Albumin: 3.6 g/dL (ref 3.5–5.0)
BILIRUBIN TOTAL: 1 mg/dL (ref 0.3–1.2)
BUN: 23 mg/dL — ABNORMAL HIGH (ref 6–20)
CALCIUM: 8.9 mg/dL (ref 8.9–10.3)
CO2: 23 mmol/L (ref 22–32)
CREATININE: 1.16 mg/dL (ref 0.61–1.24)
Chloride: 103 mmol/L (ref 101–111)
Glucose, Bld: 176 mg/dL — ABNORMAL HIGH (ref 65–99)
Potassium: 4.7 mmol/L (ref 3.5–5.1)
SODIUM: 136 mmol/L (ref 135–145)
TOTAL PROTEIN: 8.2 g/dL — AB (ref 6.5–8.1)

## 2017-01-02 LAB — CBC WITH DIFFERENTIAL/PLATELET
BASOS PCT: 1 %
Basophils Absolute: 0.1 10*3/uL (ref 0–0.1)
Eosinophils Absolute: 0.5 10*3/uL (ref 0–0.7)
Eosinophils Relative: 4 %
HEMATOCRIT: 38.3 % — AB (ref 40.0–52.0)
HEMOGLOBIN: 12.7 g/dL — AB (ref 13.0–18.0)
LYMPHS ABS: 3.3 10*3/uL (ref 1.0–3.6)
LYMPHS PCT: 27 %
MCH: 30.6 pg (ref 26.0–34.0)
MCHC: 33.2 g/dL (ref 32.0–36.0)
MCV: 92.1 fL (ref 80.0–100.0)
MONOS PCT: 9 %
Monocytes Absolute: 1.1 10*3/uL — ABNORMAL HIGH (ref 0.2–1.0)
NEUTROS ABS: 7.4 10*3/uL — AB (ref 1.4–6.5)
NEUTROS PCT: 59 %
Platelets: 257 10*3/uL (ref 150–440)
RBC: 4.16 MIL/uL — ABNORMAL LOW (ref 4.40–5.90)
RDW: 15.4 % — ABNORMAL HIGH (ref 11.5–14.5)
WBC: 12.4 10*3/uL — ABNORMAL HIGH (ref 3.8–10.6)

## 2017-01-02 LAB — TROPONIN I: Troponin I: <0.03 ng/mL (ref <0.03)

## 2017-01-02 LAB — URINE DRUG SCREEN, QUALITATIVE (ARMC ONLY)
AMPHETAMINES, UR SCREEN: NOT DETECTED
BENZODIAZEPINE, UR SCRN: NOT DETECTED
Barbiturates, Ur Screen: NOT DETECTED
CANNABINOID 50 NG, UR ~~LOC~~: POSITIVE — AB
Cocaine Metabolite,Ur ~~LOC~~: NOT DETECTED
MDMA (Ecstasy)Ur Screen: NOT DETECTED
Methadone Scn, Ur: NOT DETECTED
Opiate, Ur Screen: POSITIVE — AB
PHENCYCLIDINE (PCP) UR S: NOT DETECTED
TRICYCLIC, UR SCREEN: NOT DETECTED

## 2017-01-02 LAB — LIPASE, BLOOD: LIPASE: 18 U/L (ref 11–51)

## 2017-01-02 LAB — HEMOGLOBIN
HEMOGLOBIN: 13 g/dL (ref 13.0–18.0)
HEMOGLOBIN: 12 g/dL — AB (ref 13.0–18.0)

## 2017-01-02 LAB — PROTIME-INR
INR: 1.2
Prothrombin Time: 15.1 seconds (ref 11.4–15.2)

## 2017-01-02 LAB — PREPARE RBC (CROSSMATCH)

## 2017-01-02 LAB — ETHANOL

## 2017-01-02 LAB — LACTIC ACID, PLASMA
LACTIC ACID, VENOUS: 1.1 mmol/L (ref 0.5–1.9)
Lactic Acid, Venous: 2 mmol/L (ref 0.5–1.9)

## 2017-01-02 LAB — SALICYLATE LEVEL

## 2017-01-02 LAB — ACETAMINOPHEN LEVEL: Acetaminophen (Tylenol), Serum: <10 ug/mL — ABNORMAL LOW (ref 10–30)

## 2017-01-02 LAB — ABO/RH: ABO/RH(D): A POS

## 2017-01-02 SURGERY — ESOPHAGOGASTRODUODENOSCOPY (EGD) WITH PROPOFOL
Anesthesia: General

## 2017-01-02 MED ORDER — PANTOPRAZOLE SODIUM 40 MG IV SOLR: 40.0000 mg | Freq: Two times a day (BID) | INTRAVENOUS | Status: DC

## 2017-01-02 MED ORDER — ROCURONIUM BROMIDE 50 MG/5ML IV SOLN
INTRAVENOUS | Status: AC
  Filled 2017-01-02: qty 1

## 2017-01-02 MED ORDER — ONDANSETRON HCL 4 MG/2ML IJ SOLN: 4.0000 mg | Freq: Once | INTRAMUSCULAR | Status: DC | PRN

## 2017-01-02 MED ORDER — DEXAMETHASONE SODIUM PHOSPHATE 10 MG/ML IJ SOLN
INTRAMUSCULAR | Status: DC | PRN
  Administered 2017-01-02: 5 mg via INTRAVENOUS

## 2017-01-02 MED ORDER — DOCUSATE SODIUM 100 MG PO CAPS: 100.0000 mg | ORAL_CAPSULE | Freq: Two times a day (BID) | ORAL | Status: DC | PRN

## 2017-01-02 MED ORDER — SODIUM CHLORIDE 0.9 % IV SOLN: INTRAVENOUS | Status: DC

## 2017-01-02 MED ORDER — SODIUM CHLORIDE 0.9 % IV SOLN
80.0000 mg | Freq: Once | INTRAVENOUS | Status: AC
  Administered 2017-01-02: 80 mg via INTRAVENOUS
  Filled 2017-01-02: qty 80

## 2017-01-02 MED ORDER — GLYCOPYRROLATE 0.2 MG/ML IJ SOLN: INTRAMUSCULAR | Status: DC | PRN

## 2017-01-02 MED ORDER — MORPHINE SULFATE (PF) 2 MG/ML IV SOLN
2.0000 mg | INTRAVENOUS | Status: DC | PRN
  Administered 2017-01-02 – 2017-01-03 (×4): 2 mg via INTRAVENOUS
  Filled 2017-01-02 (×4): qty 1

## 2017-01-02 MED ORDER — SODIUM CHLORIDE 0.9 % IV SOLN: 10.0000 mL/h | Freq: Once | INTRAVENOUS | Status: DC

## 2017-01-02 MED ORDER — MIDAZOLAM HCL 2 MG/2ML IJ SOLN
INTRAMUSCULAR | Status: AC
  Filled 2017-01-02: qty 2

## 2017-01-02 MED ORDER — OCTREOTIDE LOAD VIA INFUSION
50.0000 ug | Freq: Once | INTRAVENOUS | Status: AC
  Administered 2017-01-02: 50 ug via INTRAVENOUS
  Filled 2017-01-02: qty 25

## 2017-01-02 MED ORDER — ONDANSETRON HCL 4 MG/2ML IJ SOLN
4.0000 mg | Freq: Once | INTRAMUSCULAR | Status: AC
  Administered 2017-01-02: 4 mg via INTRAVENOUS
  Filled 2017-01-02: qty 2

## 2017-01-02 MED ORDER — GLYCOPYRROLATE 0.2 MG/ML IJ SOLN
INTRAMUSCULAR | Status: DC | PRN
  Administered 2017-01-02: 0.2 mg via INTRAVENOUS

## 2017-01-02 MED ORDER — SODIUM CHLORIDE 0.9 % IV SOLN
8.0000 mg/h | INTRAVENOUS | Status: DC
  Administered 2017-01-02 – 2017-01-03 (×3): 8 mg/h via INTRAVENOUS
  Filled 2017-01-02 (×3): qty 80

## 2017-01-02 MED ORDER — PROPOFOL 10 MG/ML IV BOLUS
INTRAVENOUS | Status: AC
  Filled 2017-01-02: qty 20

## 2017-01-02 MED ORDER — SUCCINYLCHOLINE CHLORIDE 20 MG/ML IJ SOLN
INTRAMUSCULAR | Status: AC
  Filled 2017-01-02: qty 1

## 2017-01-02 MED ORDER — LIDOCAINE HCL (CARDIAC) 20 MG/ML IV SOLN
INTRAVENOUS | Status: DC | PRN
  Administered 2017-01-02: 60 mg via INTRAVENOUS

## 2017-01-02 MED ORDER — DEXAMETHASONE SODIUM PHOSPHATE 10 MG/ML IJ SOLN
INTRAMUSCULAR | Status: AC
  Filled 2017-01-02: qty 1

## 2017-01-02 MED ORDER — SODIUM CHLORIDE 0.9 % IV SOLN
INTRAVENOUS | Status: DC
  Administered 2017-01-02 (×2): via INTRAVENOUS

## 2017-01-02 MED ORDER — ONDANSETRON HCL 4 MG/2ML IJ SOLN
INTRAMUSCULAR | Status: AC
  Filled 2017-01-02: qty 2

## 2017-01-02 MED ORDER — LACTATED RINGERS IV SOLN
INTRAVENOUS | Status: DC | PRN
  Administered 2017-01-02: 12:00:00 via INTRAVENOUS

## 2017-01-02 MED ORDER — SODIUM CHLORIDE 0.9 % IV SOLN
10.0000 mL/h | Freq: Once | INTRAVENOUS | Status: AC
  Administered 2017-01-02: 10 mL/h via INTRAVENOUS

## 2017-01-02 MED ORDER — DIPHENHYDRAMINE HCL 50 MG/ML IJ SOLN
25.0000 mg | Freq: Once | INTRAMUSCULAR | Status: AC
  Administered 2017-01-02: 25 mg via INTRAVENOUS
  Filled 2017-01-02: qty 1

## 2017-01-02 MED ORDER — SUGAMMADEX SODIUM 200 MG/2ML IV SOLN
INTRAVENOUS | Status: AC
  Filled 2017-01-02: qty 2

## 2017-01-02 MED ORDER — ROCURONIUM BROMIDE 100 MG/10ML IV SOLN
INTRAVENOUS | Status: DC | PRN
  Administered 2017-01-02 (×2): 10 mg via INTRAVENOUS

## 2017-01-02 MED ORDER — ONDANSETRON HCL 4 MG/2ML IJ SOLN
INTRAMUSCULAR | Status: DC | PRN
  Administered 2017-01-02: 4 mg via INTRAVENOUS

## 2017-01-02 MED ORDER — FENTANYL CITRATE (PF) 100 MCG/2ML IJ SOLN
INTRAMUSCULAR | Status: AC
  Filled 2017-01-02: qty 2

## 2017-01-02 MED ORDER — FENTANYL CITRATE (PF) 100 MCG/2ML IJ SOLN: 25.0000 ug | INTRAMUSCULAR | Status: DC | PRN

## 2017-01-02 MED ORDER — SODIUM CHLORIDE 0.9 % IV BOLUS (SEPSIS)
1000.0000 mL | Freq: Once | INTRAVENOUS | Status: AC
  Administered 2017-01-02: 1000 mL via INTRAVENOUS

## 2017-01-02 MED ORDER — MIDAZOLAM HCL 5 MG/5ML IJ SOLN
INTRAMUSCULAR | Status: DC | PRN
  Administered 2017-01-02: 2 mg via INTRAVENOUS

## 2017-01-02 MED ORDER — SUCCINYLCHOLINE CHLORIDE 20 MG/ML IJ SOLN
INTRAMUSCULAR | Status: DC | PRN
  Administered 2017-01-02: 120 mg via INTRAVENOUS

## 2017-01-02 MED ORDER — SODIUM CHLORIDE 0.9 % IV SOLN
50.0000 ug/h | INTRAVENOUS | Status: DC
  Administered 2017-01-02 – 2017-01-03 (×3): 50 ug/h via INTRAVENOUS
  Filled 2017-01-02 (×7): qty 1

## 2017-01-02 MED ORDER — FENTANYL CITRATE (PF) 100 MCG/2ML IJ SOLN
INTRAMUSCULAR | Status: DC | PRN
  Administered 2017-01-02: 100 ug via INTRAVENOUS

## 2017-01-02 MED ORDER — SUGAMMADEX SODIUM 200 MG/2ML IV SOLN
INTRAVENOUS | Status: DC | PRN
  Administered 2017-01-02: 130 mg via INTRAVENOUS

## 2017-01-02 MED ORDER — FENTANYL CITRATE (PF) 100 MCG/2ML IJ SOLN
25.0000 ug | Freq: Once | INTRAMUSCULAR | Status: AC
  Administered 2017-01-02: 25 ug via INTRAVENOUS
  Filled 2017-01-02: qty 2

## 2017-01-02 MED ORDER — PROPOFOL 10 MG/ML IV BOLUS
INTRAVENOUS | Status: DC | PRN
  Administered 2017-01-02: 160 mg via INTRAVENOUS

## 2017-01-02 MED ORDER — PHENYLEPHRINE HCL 10 MG/ML IJ SOLN
INTRAMUSCULAR | Status: DC | PRN
  Administered 2017-01-02 (×3): 100 ug via INTRAVENOUS

## 2017-01-02 NOTE — ED Notes (Addendum)
Patient expelled 600 ml blood

## 2017-01-02 NOTE — ED Notes (Signed)
Date and time results received: 01/02/17 1005 (use smartphrase ".now" to insert current time)  Test: Lactic Acid Critical Value: 2.0  Name of Provider Notified: Dr. Marijean Bravo  Orders Received? Or Actions Taken?: Actions Taken: Notified

## 2017-01-02 NOTE — ED Triage Notes (Signed)
Pt to ED c/o abdominal pain and vomiting blood. Pt states that he has a hx/o hepatitis and is seen at [facility] for this. Pt states that he has vomited a large amount of blood twice during the night. Pt states that he is having pain in the lower abdomen that started last night as well. Pt states that he has not taken his temperature but he was shivering and sweating all night. Pt appears uncomfortable in triage.

## 2017-01-02 NOTE — Anesthesia Preprocedure Evaluation (Signed)
Anesthesia Evaluation  Patient identified by MRN, date of birth, ID band Patient awake    Reviewed: Allergy & Precautions, NPO status , Patient's Chart, lab work & pertinent test results, reviewed documented beta blocker date and time   Airway Mallampati: II  TM Distance: >3 FB Neck ROM: Full    Dental no notable dental hx.    Pulmonary shortness of breath, pneumonia, resolved, COPD, Current Smoker,    Pulmonary exam normal breath sounds clear to auscultation       Cardiovascular + CAD, + Past MI and +CHF  Normal cardiovascular exam Rhythm:Regular Rate:Normal     Neuro/Psych  Headaches, PSYCHIATRIC DISORDERS Anxiety    GI/Hepatic GERD  ,(+) Cirrhosis       , Hepatitis -  Endo/Other    Renal/GU      Musculoskeletal  (+) Arthritis ,   Abdominal   Peds  Hematology  (+) anemia ,   Anesthesia Other Findings Gi bleed.Liver mets,Ca.EKG poor Rs, otherwise ok.  Reproductive/Obstetrics                             Anesthesia Physical Anesthesia Plan  ASA: III  Anesthesia Plan: General   Post-op Pain Management:    Induction: Intravenous  PONV Risk Score and Plan:   Airway Management Planned:   Additional Equipment:   Intra-op Plan:   Post-operative Plan:   Informed Consent: I have reviewed the patients History and Physical, chart, labs and discussed the procedure including the risks, benefits and alternatives for the proposed anesthesia with the patient or authorized representative who has indicated his/her understanding and acceptance.     Plan Discussed with: CRNA  Anesthesia Plan Comments:         Anesthesia Quick Evaluation

## 2017-01-02 NOTE — ED Notes (Signed)
Pt c/o hives to R forearm IV after administration of Fentanyl through the IV. MD aware and at bedside to assess, VORB for 25mg  of Benadryl IV for the hives. No hives noted to site where blood is being administered. No sings of systemic allergic reaction.

## 2017-01-02 NOTE — ED Provider Notes (Signed)
[facility] Emergency Department Provider Note ____________________________________________   I have reviewed the triage vital signs and the triage nursing note.  HISTORY  Chief Complaint Abdominal Pain and Hematemesis   Historian 5 caveat: Patient history somewhat limited by critical illness.  HPI SAATVIK Phillip Warren is a 61 y.o. male with a history of alcohol abuse, anorexia, liver cancer and cirrhosis of the liver as well as polysubstance drug abuse presents to the ED with upper GI bleeding, reports large volume hematemesis overnight, and additional large bloody and clotted hematemesis here in the ER.  Denies abdominal pain.  States no history of GI bleeding in the past.  Does not know if he is ever had varices.  He feels severely weak and fatigued.  No chest pain or trouble breathing.     Past Medical History:  Diagnosis Date  . Alcohol abuse   . Anorexia    has trouble eating due to poor appetite  . Anxiety    trouble sleeping; lost two sisters and his mother close together  . Arthritis    hands, feet (burning, stinging)  . Cancer [facility])    liver lesion initial staging.  . CHF (congestive heart failure) ([place])   . Cirrhosis of liver ([place])   . Cocaine abuse ([place])   . COPD (chronic obstructive pulmonary disease) ([place])   . Coronary artery disease   . GERD (gastroesophageal reflux disease)   . H/O dizziness   . Headache   . Hepatitis    B and C (chronic)  . Marijuana use   . Myocardial infarction ([place])    massive MI at age 47  . Neuropathy   . Numbness and tingling    legs and feet bilat   . Pneumonia   . Repeated falls   . Shortness of breath dyspnea   . Tinnitus   . Tobacco abuse     Patient Active Problem List   Diagnosis Date Noted  . GI bleed 01/02/2017  . Hepatic cirrhosis ([place])   . Hepatocellular carcinoma ([place])   . [place] (hepatocellular carcinoma) ([place])   . Hepatitis, viral   . Cancer, hepatocellular ([place]) 08/31/2014  .  Absolute anemia 08/26/2014  . Cirrhosis ([place]) 08/26/2014  . Arteriosclerosis of coronary artery 08/26/2014  . HBV (hepatitis B virus) infection 08/26/2014  . HCV (hepatitis C virus) 08/26/2014  . Chronic hepatitis C ([place]) 04/13/2014  . Foot pain 04/13/2014    Past Surgical History:  Procedure Laterality Date  . CARDIAC SURGERY    . COLONOSCOPY WITH PROPOFOL N/A 08/31/2014   Procedure: COLONOSCOPY WITH PROPOFOL;  Surgeon: Josefine Class, MD;  Location: [facility] ENDOSCOPY;  Service: Endoscopy;  Laterality: N/A;  . CORONARY ANGIOPLASTY     at age 60 secondary to massive MI  . ESOPHAGOGASTRODUODENOSCOPY (EGD) WITH PROPOFOL N/A 08/31/2014   Procedure: ESOPHAGOGASTRODUODENOSCOPY (EGD) WITH PROPOFOL;  Surgeon: Josefine Class, MD;  Location: [facility] ENDOSCOPY;  Service: Endoscopy;  Laterality: N/A;    Prior to Admission medications   Medication Sig Start Date End Date Taking? Authorizing Provider  escitalopram (LEXAPRO) 10 MG tablet Take 1 tablet by mouth daily. 10/20/16  Yes [provider]  gabapentin (NEURONTIN) 800 MG tablet Take 800 mg by mouth 4 (four) times daily.    Yes [provider]  LYRICA 50 MG capsule Take 1 capsule by mouth 3 (three) times daily. 10/01/16  Yes [provider]  propranolol (INDERAL) 10 MG tablet Take 1 tablet by mouth 2 (two) times daily. 11/24/16  Yes [provider]  traMADol (ULTRAM) 50 MG tablet Take 1 tablet by mouth 2 (two) times daily. 12/23/16  Yes [provider]  diphenhydrAMINE (BENADRYL) 25 MG tablet Take 1 tablet (25 mg total) by mouth every 6 (six) hours as needed. 08/11/14   Carrie Mew, MD  nitroGLYCERIN (NITROSTAT) 0.4 MG SL tablet Place 0.4 mg under the tongue every 5 (five) minutes as needed for chest pain.  05/10/13   [provider]  nortriptyline (PAMELOR) 10 MG capsule Take 1-2 capsules by mouth at bedtime. Take 1 capsule (10mg ) by mouth at every night for 1 week then increase to 2  capsules (20mg ) by mouth afterwards 11/07/16   [provider]  sildenafil (REVATIO) 20 MG tablet Take 1 tablet (20 mg total) by mouth as needed (take 2-5 tablets as needed for sexual activity). 08/19/16   Ardis Hughs, MD  tamsulosin (FLOMAX) 0.4 MG CAPS capsule Take 1 capsule (0.4 mg total) by mouth daily. Patient not taking: Reported on 01/02/2017 08/19/16   Ardis Hughs, MD    Allergies  Allergen Reactions  . Multihance [Gadobenate] Hives and Shortness Of Breath  . Hydrocodone Itching and Nausea And Vomiting  . Vicodin [Hydrocodone-Acetaminophen] Nausea Only and Nausea And Vomiting    Vomiting     Family History  Problem Relation Age of Onset  . CAD Father   . Lung cancer Sister   . Prostate cancer Neg Hx   . Bladder Cancer Neg Hx   . Kidney cancer Neg Hx     Social History Social History   Tobacco Use  . Smoking status: Current Every Day Smoker    Packs/day: 0.50    Years: 30.00    Pack years: 15.00    Types: Cigarettes  . Smokeless tobacco: Never Used  Substance Use Topics  . Alcohol use: Yes    Comment: Daily  . Drug use: Yes    Types: Marijuana, Cocaine    Comment: pt states has not used cocaine in 20 years     Review of Systems  Constitutional: Negative for fever. Eyes: Negative for visual changes. ENT: Negative for sore throat. Cardiovascular: Negative for chest pain. Respiratory: Negative for shortness of breath. Gastrointestinal: Positive for mild intermittent abdominal pain. Genitourinary: Negative for dysuria. Musculoskeletal: Negative for back pain. Skin: Negative for rash. Neurological: Negative for headache.  ____________________________________________   PHYSICAL EXAM:  VITAL SIGNS: ED Triage Vitals [01/02/17 0838]  Enc Vitals Group     BP (!) 128/113     Pulse Rate (!) 147     Resp 16     Temp 97.9 F (36.6 C)     Temp Source Oral     SpO2 100 %     Weight 143 lb (64.9 kg)     Height 6' (1.829 m)     Head  Circumference      Peak Flow      Pain Score 10     Pain Loc      Pain Edu?      Excl. in [place]?      Constitutional: Alert and oriented.  Very pale and somewhat slow to answer questions. HEENT   Head: Normocephalic and atraumatic.      Eyes: Conjunctivae are normal. Pupils equal and round.       Ears:         Nose: No congestion/rhinnorhea.   Mouth/Throat: Mucous membranes are pearly dry, blood in the mouth.   Neck: No stridor. Cardiovascular/Chest:  Tachycardic rate, regular rhythm.  No murmurs, rubs, or gallops. Respiratory: Normal respiratory effort without tachypnea nor retractions. Breath sounds are clear and equal bilaterally. No wheezes/rales/rhonchi. Gastrointestinal: Soft. No distention, no guarding, no rebound.  Mild tenderness in the epigastrium.    Genitourinary/rectal:Deferred Musculoskeletal: nontender extremities with normal range of motion in all extremities.  Neurologic:  Normal speech and language. No gross or focal neurologic deficits are appreciated. Skin:  Skin is warm, dry and intact. No rash noted. Psychiatric: Mood and affect are normal. Speech and behavior are normal. Patient exhibits appropriate insight and judgment.   ____________________________________________  LABS (pertinent positives/negatives) I, Lisa Roca, MD the attending physician have reviewed the labs noted below.  Labs Reviewed  COMPREHENSIVE METABOLIC PANEL - Abnormal; Notable for the following components:      Result Value   Glucose, Bld 176 (*)    BUN 23 (*)    Total Protein 8.2 (*)    AST 58 (*)    All other components within normal limits  ACETAMINOPHEN LEVEL - Abnormal; Notable for the following components:   Acetaminophen (Tylenol), Serum <10 (*)    All other components within normal limits  LACTIC ACID, PLASMA - Abnormal; Notable for the following components:   Lactic Acid, Venous 2.0 (*)    All other components within normal limits  CBC WITH DIFFERENTIAL/PLATELET -  Abnormal; Notable for the following components:   WBC 12.4 (*)    RBC 4.16 (*)    Hemoglobin 12.7 (*)    HCT 38.3 (*)    RDW 15.4 (*)    Neutro Abs 7.4 (*)    Monocytes Absolute 1.1 (*)    All other components within normal limits  ETHANOL  LIPASE, BLOOD  SALICYLATE LEVEL  TROPONIN I  PROTIME-INR  HEMOGLOBIN  LACTIC ACID, PLASMA  HEMOGLOBIN  URINE DRUG SCREEN, QUALITATIVE ([facility] ONLY)  TYPE AND SCREEN  PREPARE RBC (CROSSMATCH)  PREPARE RBC (CROSSMATCH)  ABO/RH    ____________________________________________    EKG I, Lisa Roca, MD, the attending physician have personally viewed and interpreted all ECGs.  104 beats per minute.  Sinus tach cardia.  Narrow transfer normal axis.  Normal ST and T wave ____________________________________________  RADIOLOGY All Xrays were viewed by me.  Imaging interpreted by Radiologist, and I, Lisa Roca, MD the attending physician have reviewed the radiologist interpretation noted below.  CXR: No acute active disease To my view: No free air.  Abdomen one view: Gallstones.  Otherwise negative. __________________________________________  PROCEDURES  Procedure(s) performed: None  Critical Care performed: CRITICAL CARE Performed by: Lisa Roca   Total critical care time: 75 minutes  Critical care time was exclusive of separately billable procedures and treating other patients.  Critical care was necessary to treat or prevent imminent or life-threatening deterioration.  Critical care was time spent personally by me on the following activities: development of treatment plan with patient and/or surrogate as well as nursing, discussions with consultants, evaluation of patient's response to treatment, examination of patient, obtaining history from patient or surrogate, ordering and performing treatments and interventions, ordering and review of laboratory studies, ordering and review of radiographic studies, pulse oximetry and  re-evaluation of patient's condition.    ____________________________________________  ED COURSE / ASSESSMENT AND PLAN  Pertinent labs & imaging results that were available during my care of the patient were reviewed by me and considered in my medical decision making (see chart for details).   I was called to the patient's room immediately because he was pale  and vomiting blood, appeared to have about 4 500 cc of frank hematemesis in the bag and on the floor.  Patient was hypotensive in the 80s and tachycardic in the 130s.  He looked pale and was slow to respond.  2 large bore IVs were placed immediately and patient was actually able to interact a little further.  2 there is no normal saline were started initially.  Given the hypotension tachycardia, volume instability, patient was ordered for 2 units of emergency release O blood.  Further history and chart review indicates patient does have cirrhosis as a result of alcohol, as well as history of hep C, as well as history of liver cancer that he states he had surgery for 2 years ago.  He states that he had an endoscopy and colonoscopy, and may have been 2 years ago.  When I reviewed the chart history, there was indication of grade 2 esophageal varices on endoscopy 2 years ago although he has no history himself of upper GI bleeding.  Patient was ordered to start on a Protonix bolus and drip, octreotide bolus and drip.  Patient's blood pressure did come up to about 601 systolic and heart rate came down from the 130s and 140s down into the 120s.  He was looking a little bit better and less nauseated.  GI was consulted as well as hospitalist for hospital admission.  Then the patient had 600 cc of frank blood hematemesis.  GI was called back and came to the bedside.  2 additional units were ordered.  Blood work did return showing a hemoglobin around 12, but the setting of acute bleeding with signs of hemodynamic instability, 2 additional  units were ordered and started transfusion while patient was being prepared to go up to the endoscopy suite.   DIFFERENTIAL DIAGNOSIS: Including but not limited to variceal bleeding, bleeding stomach ulcer, perforated stomach ulcer,.  CONSULTATIONS:  Dr. Alice Reichert, gastroenterology.  Initially discussed with him the patient was looking stable, then called back after patient had a additional 600 cc of frank blood hematemesis and Dr. Alice Reichert came to the bedside.  I also spoke with Dr. Boyce Medici, hospitalist for hospital admission.  Patient / Family / Caregiver informed of clinical course, medical decision-making process, and agree with plan.  ___________________________________________   FINAL CLINICAL IMPRESSION(S) / ED DIAGNOSES   Final diagnoses:  Acute upper GI bleed      ___________________________________________        Note: This dictation was prepared with Dragon dictation. Any transcriptional errors that result from this process are unintentional    Lisa Roca, MD 01/02/17 1147

## 2017-01-02 NOTE — Anesthesia Postprocedure Evaluation (Signed)
Anesthesia Post Note  Patient: Phillip Warren  Procedure(s) Performed: ESOPHAGOGASTRODUODENOSCOPY (EGD) WITH PROPOFOL (N/A )  Patient location during evaluation: Endoscopy Anesthesia Type: General Level of consciousness: awake and alert Pain management: pain level controlled Vital Signs Assessment: post-procedure vital signs reviewed and stable Respiratory status: spontaneous breathing, nonlabored ventilation, respiratory function stable and patient connected to nasal cannula oxygen Cardiovascular status: blood pressure returned to baseline and stable Postop Assessment: no apparent nausea or vomiting Anesthetic complications: no     Last Vitals:  Vitals:   01/02/17 1335 01/02/17 1424  BP: 106/84 100/68  Pulse: 96 90  Resp: 17 16  Temp: 37.1 C 37 C  SpO2: 97% 97%    Last Pain:  Vitals:   01/02/17 1438  TempSrc:   PainSc: 10-Worst pain ever                 Zakyla Tonche S

## 2017-01-02 NOTE — ED Notes (Signed)
2ND UNIT OF EMERGENCY RELEASE BLOOD INITIATED AT THIS TIME AT A RATE OF 610ML/HR, PER MD UNIT IS TO BE GIVEN OVER 30 MIN DUE TO PATIENT CONDITION. UNIT VERIFIED AND DUAL SIGN OFF PERFORMED WITH MEGAN, RN.

## 2017-01-02 NOTE — ED Notes (Signed)
1ST UNIT OF EMERGENCY RELEASE BLOOD INITIATED AT THIS TIME. DUAL VERIFIED WITH MEGAN, RN. INITIATED AT 650ML/HR PER DR.LORD'S ORDER FOR BLOOD TO BE HUNG OVER 30MINS DUE TO PATIENT CONDITION.

## 2017-01-02 NOTE — Transfer of Care (Signed)
Immediate Anesthesia Transfer of Care Note  Patient: Phillip Warren  Procedure(s) Performed: ESOPHAGOGASTRODUODENOSCOPY (EGD) WITH PROPOFOL (N/A )  Patient Location: PACU  Anesthesia Type:General  Level of Consciousness: awake and patient cooperative  Airway & Oxygen Therapy: Patient Spontanous Breathing and Patient connected to face mask oxygen  Post-op Assessment: Report given to RN and Post -op Vital signs reviewed and stable  Post vital signs: Reviewed and stable  Last Vitals:  Vitals:   01/02/17 1103 01/02/17 1305  BP: 102/62 127/90  Pulse: 90 (!) 118  Resp: 17 15  Temp: 36.9 C 37.2 C  SpO2: 99% 100%    Last Pain:  Vitals:   01/02/17 1103  TempSrc: Oral  PainSc:          Complications: No apparent anesthesia complications

## 2017-01-02 NOTE — H&P (Signed)
[facility] [place] NAME: Phillip Warren    MR#:  [id number]  DATE OF BIRTH:  October 12, 1955  DATE OF ADMISSION:  01/02/2017  PRIMARY CARE PHYSICIAN: Glendon Axe, MD   REQUESTING/REFERRING PHYSICIAN:  Reita Cliche  CHIEF COMPLAINT:   Chief Complaint  Patient presents with  . Abdominal Pain  . Hematemesis    HISTORY OF PRESENT ILLNESS: Phillip Warren  is a 61 y.o. male with a known history of Alcohol abuse in past, Hep B, liver cirrhosis, CHF, Anxiety, Smoking- Started following at [facility] is to start anti hep B treatment soon. Last night vomited at home, and after switching on light, saw it was all blood. So he came to ER. Noted to have Hypotension and tachycardia- but stabilized after IV fluids bolus. Initial Hb was 12.6. Again in ER- Vomited once around 600 ml fresh blood- so ER physician started on Octreotide and Protonix drip and spoke to Oncall GI. Ordered 2 units PRBC by ER. Pt denies recent drinking- said he quit for few months.  PAST MEDICAL HISTORY:   Past Medical History:  Diagnosis Date  . Alcohol abuse   . Anorexia    has trouble eating due to poor appetite  . Anxiety    trouble sleeping; lost two sisters and his mother close together  . Arthritis    hands, feet (burning, stinging)  . Cancer [facility])    liver lesion initial staging.  . CHF (congestive heart failure) ([place])   . Cirrhosis of liver ([place])   . Cocaine abuse ([place])   . COPD (chronic obstructive pulmonary disease) ([place])   . Coronary artery disease   . GERD (gastroesophageal reflux disease)   . H/O dizziness   . Headache   . Hepatitis    B and C (chronic)  . Marijuana use   . Myocardial infarction ([place])    massive MI at age 89  . Neuropathy   . Numbness and tingling    legs and feet bilat   . Pneumonia   . Repeated falls   . Shortness of breath dyspnea   . Tinnitus   . Tobacco abuse     PAST SURGICAL HISTORY:  Past Surgical History:  Procedure  Laterality Date  . CARDIAC SURGERY    . COLONOSCOPY WITH PROPOFOL N/A 08/31/2014   Procedure: COLONOSCOPY WITH PROPOFOL;  Surgeon: Josefine Class, MD;  Location[facility] ENDOSCOPY;  Service: Endoscopy;  Laterality: N/A;  . CORONARY ANGIOPLASTY     at age 76 secondary to massive MI  . ESOPHAGOGASTRODUODENOSCOPY (EGD) WITH PROPOFOL N/A 08/31/2014   Procedure: ESOPHAGOGASTRODUODENOSCOPY (EGD) WITH PROPOFOL;  Surgeon: Josefine Class, MD;  Location: [facility] ENDOSCOPY;  Service: Endoscopy;  Laterality: N/A;    SOCIAL HISTORY:  Social History   Tobacco Use  . Smoking status: Current Every Day Smoker    Packs/day: 0.50    Years: 30.00    Pack years: 15.00    Types: Cigarettes  . Smokeless tobacco: Never Used  Substance Use Topics  . Alcohol use: Yes    Comment: Daily    FAMILY HISTORY:  Family History  Problem Relation Age of Onset  . CAD Father   . Lung cancer Sister   . Prostate cancer Neg Hx   . Bladder Cancer Neg Hx   . Kidney cancer Neg Hx     DRUG ALLERGIES:  Allergies  Allergen Reactions  . Multihance [Gadobenate] Hives and Shortness Of Breath  . Hydrocodone Itching and  Nausea And Vomiting  . Vicodin [Hydrocodone-Acetaminophen] Nausea Only and Nausea And Vomiting    Vomiting     REVIEW OF SYSTEMS:   CONSTITUTIONAL: No fever, fatigue or weakness.  EYES: No blurred or double vision.  EARS, NOSE, AND THROAT: No tinnitus or ear pain.  RESPIRATORY: No cough, shortness of breath, wheezing or hemoptysis.  CARDIOVASCULAR: No chest pain, orthopnea, edema.  GASTROINTESTINAL: No nausea, have vomiting, no diarrhea or abdominal pain.  GENITOURINARY: No dysuria, hematuria.  ENDOCRINE: No polyuria, nocturia,  HEMATOLOGY: No anemia, easy bruising or bleeding SKIN: No rash or lesion. MUSCULOSKELETAL: No joint pain or arthritis.   NEUROLOGIC: No tingling, numbness, weakness.  PSYCHIATRY: No anxiety or depression.   MEDICATIONS AT HOME:  Prior to Admission medications    Medication Sig Start Date End Date Taking? Authorizing Provider  escitalopram (LEXAPRO) 10 MG tablet Take 1 tablet by mouth daily. 10/20/16  Yes [provider]  gabapentin (NEURONTIN) 800 MG tablet Take 800 mg by mouth 4 (four) times daily.    Yes [provider]  LYRICA 50 MG capsule Take 1 capsule by mouth 3 (three) times daily. 10/01/16  Yes [provider]  propranolol (INDERAL) 10 MG tablet Take 1 tablet by mouth 2 (two) times daily. 11/24/16  Yes [provider]  traMADol (ULTRAM) 50 MG tablet Take 1 tablet by mouth 2 (two) times daily. 12/23/16  Yes [provider]  diphenhydrAMINE (BENADRYL) 25 MG tablet Take 1 tablet (25 mg total) by mouth every 6 (six) hours as needed. 08/11/14   Carrie Mew, MD  nitroGLYCERIN (NITROSTAT) 0.4 MG SL tablet Place 0.4 mg under the tongue every 5 (five) minutes as needed for chest pain.  05/10/13   [provider]  nortriptyline (PAMELOR) 10 MG capsule Take 1-2 capsules by mouth at bedtime. Take 1 capsule (10mg ) by mouth at every night for 1 week then increase to 2 capsules (20mg ) by mouth afterwards 11/07/16   [provider]  sildenafil (REVATIO) 20 MG tablet Take 1 tablet (20 mg total) by mouth as needed (take 2-5 tablets as needed for sexual activity). 08/19/16   Ardis Hughs, MD  tamsulosin (FLOMAX) 0.4 MG CAPS capsule Take 1 capsule (0.4 mg total) by mouth daily. Patient not taking: Reported on 01/02/2017 08/19/16   Ardis Hughs, MD      PHYSICAL EXAMINATION:   VITAL SIGNS: Blood pressure 120/86, pulse (!) 112, temperature 97.8 F (36.6 C), temperature source Oral, resp. rate 18, height 6' (1.829 m), weight 64.9 kg (143 lb), SpO2 98 %.  GENERAL:  61 y.o.-year-old patient lying in the bed with no acute distress.  EYES: Pupils equal, round, reactive to light and accommodation. No scleral icterus. Extraocular muscles intact.  HEENT: Head atraumatic, normocephalic. Oropharynx  and nasopharynx clear.  NECK:  Supple, no jugular venous distention. No thyroid enlargement, no tenderness.  LUNGS: Normal breath sounds bilaterally, no wheezing, rales,rhonchi or crepitation. No use of accessory muscles of respiration.  CARDIOVASCULAR: S1, S2 fast. No murmurs, rubs, or gallops.  ABDOMEN: Soft, nontender, nondistended. Bowel sounds present. No organomegaly or mass.  EXTREMITIES: No pedal edema, cyanosis, or clubbing.  NEUROLOGIC: Cranial nerves II through XII are intact. Muscle strength 5/5 in all extremities. Sensation intact. Gait not checked.  PSYCHIATRIC: The patient is alert and oriented x 3.  SKIN: No obvious rash, lesion, or ulcer.   LABORATORY PANEL:   CBC Recent Labs  Lab 01/02/17 0853  WBC 12.4*  HGB 12.7*  HCT 38.3*  PLT 257  MCV 92.1  MCH 30.6  MCHC 33.2  RDW 15.4*  LYMPHSABS 3.3  MONOABS 1.1*  EOSABS 0.5  BASOSABS 0.1   ------------------------------------------------------------------------------------------------------------------  Chemistries  Recent Labs  Lab 01/02/17 0853  NA 136  K 4.7  CL 103  CO2 23  GLUCOSE 176*  BUN 23*  CREATININE 1.16  CALCIUM 8.9  AST 58*  ALT 51  ALKPHOS 80  BILITOT 1.0   ------------------------------------------------------------------------------------------------------------------ estimated creatinine clearance is 62.9 mL/min (by C-G formula based on SCr of 1.16 mg/dL). ------------------------------------------------------------------------------------------------------------------ No results for input(s): TSH, T4TOTAL, T3FREE, THYROIDAB in the last 72 hours.  Invalid input(s): FREET3   Coagulation profile Recent Labs  Lab 01/02/17 0853  INR 1.20   ------------------------------------------------------------------------------------------------------------------- No results for input(s): DDIMER in the last 72  hours. -------------------------------------------------------------------------------------------------------------------  Cardiac Enzymes Recent Labs  Lab 01/02/17 0853  TROPONINI <0.03   ------------------------------------------------------------------------------------------------------------------ Invalid input(s): POCBNP  ---------------------------------------------------------------------------------------------------------------  Urinalysis    Component Value Date/Time   COLORURINE Yellow 05/14/2014 1606   APPEARANCEUR Clear 08/19/2016 0949   LABSPEC 1.019 05/14/2014 1606   PHURINE 6.0 05/14/2014 1606   GLUCOSEU Negative 08/19/2016 0949   GLUCOSEU Negative 05/14/2014 1606   HGBUR Negative 05/14/2014 1606   BILIRUBINUR Negative 08/19/2016 0949   BILIRUBINUR Negative 05/14/2014 1606   KETONESUR Negative 05/14/2014 1606   PROTEINUR Negative 08/19/2016 0949   PROTEINUR Negative 05/14/2014 1606   NITRITE Negative 08/19/2016 0949   NITRITE Negative 05/14/2014 1606   LEUKOCYTESUR Negative 08/19/2016 0949   LEUKOCYTESUR Negative 05/14/2014 1606     RADIOLOGY: No results found.  EKG: Orders placed or performed during the hospital encounter of 01/02/17  . EKG 12-Lead  . EKG 12-Lead    IMPRESSION AND PLAN:  * Hypovolemic due to active upper GI bleed    Likely vericeal bleed    IV octreotide and protonix.   2 unit PRBC, IV fluids, NPO.   Urgent GI consult.   Frequent check on Hb and vitals.  * Hep B- liver cirrhosis   Follows at [facility] GI.  * active smoking   Counseled to quit for 4 min, offered nicotine patch.  * Anxiety    Cont to monitor, if more anxious, may need some IV ativan.  All the records are reviewed and case discussed with ED provider. Management plans discussed with the patient, family and they are in agreement.  CODE STATUS: Full. Code Status History    This patient does not have a recorded code status. Please follow your organizational  policy for patients in this situation.       TOTAL TIME TAKING CARE OF THIS PATIENT: 50 critical care minutes.    Vaughan Basta M.D on 01/02/2017   Between 7am to 6pm - Pager - [phone]  After 6pm go to [email or web address] - password [email or web address] [facility]  Office  [phone]  CC: Primary care physician; Glendon Axe, MD   Note: This dictation was prepared with Dragon dictation along with smaller phrase technology. Any transcriptional errors that result from this process are unintentional.

## 2017-01-02 NOTE — Consult Note (Addendum)
GI Inpatient Consult Note Kathline Magic, M.D.  Reason for Consult: hematemesis.   Attending Requesting Consult: Dr. Lisa Roca, [place] regional emergency room  Outpatient Primary Physician: Glendon Axe, M.D.  History of Present Illness: Phillip Warren is a 61 y.o. male with a history of viral hepatitis C BY Dr. Posey Pronto. Patient reports vomiting copious of blood this morning at home and has had 2 episodes of large-volume hemetemesis episodes here in the emergency room. The patient complains of diffuse abdominal pain but no melena. Patient is being readied to undergo treatmen over a year ago at [facility] revealing presumably esophageal varices that were not banded. It sounds like the patient has been placed on Beta blockers for variceal bleeding prophylaxis, but he has yet to start the medication. Patient underwent an EGD and colonoscopy ini 08/31/2014 by Dr. Arther Dames showing Gr II esophageal varices with no bleeding stigmata as well as a colonoscopy showing 3 benign colon polyps.  Patient reports no alcohol use in many years. He takes no anticoagulants He has a hx of presumed hepatocellular carcinoma s/p cryotherapy and possible chemoembolization at [facility] in [place].  Past Medical History:  Past Medical History:  Diagnosis Date  . Alcohol abuse   . Anorexia    has trouble eating due to poor appetite  . Anxiety    trouble sleeping; lost two sisters and his mother close together  . Arthritis    hands, feet (burning, stinging)  . Cancer [facility])    liver lesion initial staging.  . CHF (congestive heart failure) ([place])   . Cirrhosis of liver ([place])   . Cocaine abuse ([place])   . COPD (chronic obstructive pulmonary disease) ([place])   . Coronary artery disease   . GERD (gastroesophageal reflux disease)   . H/O dizziness   . Headache   . Hepatitis    B and C (chronic)  . Marijuana use   . Myocardial infarction ([place])    massive MI at age 65  . Neuropathy    . Numbness and tingling    legs and feet bilat   . Pneumonia   . Repeated falls   . Shortness of breath dyspnea   . Tinnitus   . Tobacco abuse     Problem List: Patient Active Problem List   Diagnosis Date Noted  . GI bleed 01/02/2017  . Hepatic cirrhosis ([place])   . Hepatocellular carcinoma ([place])   . [place]co (hepatocellular carcinoma) ([place])   . Hepatitis, viral   . Cancer, hepatocellular ([place]) 08/31/2014  . Absolute anemia 08/26/2014  . Cirrhosis ([place]) 08/26/2014  . Arteriosclerosis of coronary artery 08/26/2014  . HBV (hepatitis B virus) infection 08/26/2014  . HCV (hepatitis C virus) 08/26/2014  . Chronic hepatitis C ([place]) 04/13/2014  . Foot pain 04/13/2014    Past Surgical History: Past Surgical History:  Procedure Laterality Date  . CARDIAC SURGERY    . COLONOSCOPY WITH PROPOFOL N/A 08/31/2014   Procedure: COLONOSCOPY WITH PROPOFOL;  Surgeon: Josefine Class, MD;  Location[facility] ENDOSCOPY;  Service: Endoscopy;  Laterality: N/A;  . CORONARY ANGIOPLASTY     at age 41 secondary to massive MI  . ESOPHAGOGASTRODUODENOSCOPY (EGD) WITH PROPOFOL N/A 08/31/2014   Procedure: ESOPHAGOGASTRODUODENOSCOPY (EGD) WITH PROPOFOL;  Surgeon: Josefine Class, MD;  Location: [facility] ENDOSCOPY;  Service: Endoscopy;  Laterality: N/A;    Allergies: Allergies  Allergen Reactions  . Multihance [Gadobenate] Hives and Shortness Of Breath  . Hydrocodone Itching and Nausea And Vomiting  .  Vicodin [Hydrocodone-Acetaminophen] Nausea Only and Nausea And Vomiting    Vomiting     Home Medications:  (Not in a hospital admission) Home medication reconciliation was completed with the patient.   Scheduled Inpatient Medications:   . [START ON 01/05/2017] pantoprazole  40 mg Intravenous Q12H    Continuous Inpatient Infusions:   . sodium chloride    . sodium chloride    . octreotide  (SANDOSTATIN)    IV infusion 50 mcg/hr (01/02/17 0947)  . pantoprozole (PROTONIX) infusion 8 mg/hr (01/02/17 1006)     PRN Inpatient Medications:    Family History: family history includes CAD in his father; Lung cancer in his sister.   GI Family History: Negative  Social History:   reports that he has been smoking cigarettes.  He has a 15.00 pack-year smoking history. he has never used smokeless tobacco. He reports that he drinks alcohol. He reports that he uses drugs. Drugs: Marijuana and Cocaine. The patient denies ETOH, tobacco, or drug use.    Review of Systems: Review of Systems - Negative except  that in the HPI  Physical Examination: BP 120/86   Pulse (!) 112   Temp 97.8 F (36.6 C) (Oral)   Resp 18   Ht 6' (1.829 m)   Wt 64.9 kg (143 lb)   SpO2 98%   BMI 19.39 kg/m  Physical Exam  Constitutional: He appears well-developed and well-nourished.  HENT:  Head: Normocephalic and atraumatic.  Eyes: Pupils are equal, round, and reactive to light.  Neck: Normal range of motion.  Cardiovascular: Normal rate and regular rhythm.  Pulmonary/Chest: Effort normal. No respiratory distress.  Abdominal: Soft. He exhibits no mass. There is tenderness. There is guarding. There is no rebound.  Data: Lab Results  Component Value Date   WBC 12.4 (H) 01/02/2017   HGB 12.7 (L) 01/02/2017   HCT 38.3 (L) 01/02/2017   MCV 92.1 01/02/2017   PLT 257 01/02/2017   Recent Labs  Lab 01/02/17 0853  HGB 12.7*   Lab Results  Component Value Date   NA 136 01/02/2017   K 4.7 01/02/2017   CL 103 01/02/2017   CO2 23 01/02/2017   BUN 23 (H) 01/02/2017   CREATININE 1.16 01/02/2017   Lab Results  Component Value Date   ALT 51 01/02/2017   AST 58 (H) 01/02/2017   ALKPHOS 80 01/02/2017   BILITOT 1.0 01/02/2017   Recent Labs  Lab 01/02/17 0853  INR 1.20   CBC Latest Ref Rng & Units 01/02/2017 11/11/2014 11/09/2014  WBC 3.8 - 10.6 K/uL 12.4(H) 4.0 3.8(L)  Hemoglobin 13.0 - 18.0 g/dL 12.7(L) 12.6(L) 15.4  Hematocrit 40.0 - 52.0 % 38.3(L) 38.1(L) 43.5  Platelets 150 - 440 K/uL 257 60(L) 127(L)     STUDIES: No results found. @IMAGES @  Assessment: 1. UGI bleed - Presumably from variceal source. Appears ongoing. INR 1.2 hemoglobin 12.7  Recommendations:proceed with upper endoscopy and po and possible other bleeding control. The patient understands the nature of the planned procedure, indications, risks, alternatives and potential complications including but not limited to bleeding, infection, perforation, damage to internal organs and possible oversedation/side effects from anesthesia. The patient agrees and gives consent to proceed.  Please refer to procedure notes for findings, recommendations and patient disposition/instructions.  May require monitored bed afterwards pending results of endoscopy and hemostasis progress.  Obtained KUB XR and CXR - I don't see any free air.  Thank you for the consult. Please call with questions or concerns.  Tolsona, Craig,  MD  01/02/2017 10:46 AM

## 2017-01-02 NOTE — Op Note (Signed)
[facility] Patient Name: Phillip Warren Procedure Date: 01/02/2017 11:44 AM MRN: [id number] Account #: [id number] Date of Birth: 05/30/1955 Admit Type: Outpatient Age: 61 Room: [facility] ENDO ROOM 3 Gender: Male Note Status: Finalized Procedure:            Upper GI endoscopy Indications:          Therapeutic procedure, Hematemesis, Active                        gastrointestinal bleeding, Esophageal varices Providers:            Benay Pike. Alice Reichert MD, MD Referring MD:         Glendon Axe (Referring MD) Medicines:            General Anesthesia was administered Complications:        No immediate complications. Estimated blood loss:                        Minimal. Procedure:            Pre-Anesthesia Assessment:                       - The risks and benefits of the procedure and the                        sedation options and risks were discussed with the                        patient. All questions were answered and informed                        consent was obtained.                       - Patient identification and proposed procedure were                        verified prior to the procedure by the nurse. The                        procedure was verified in the procedure room.                       - ASA Grade Assessment: IV - A patient with severe                        systemic disease that is a constant threat to life.                       - After reviewing the risks and benefits, the patient                        was deemed in satisfactory condition to undergo the                        procedure.                       - The anesthesia plan was to use general anesthesia.                       -  The heart rate, respiratory rate, oxygen saturations,                        blood pressure, adequacy of pulmonary ventilation, and                        response to care were monitored throughout the                        procedure.                        After obtaining informed consent, the endoscope was                        passed under direct vision. Throughout the procedure,                        the patient's blood pressure, pulse, and oxygen                        saturations were monitored continuously. The Endoscope                        was introduced through the mouth, and advanced to the                        third part of duodenum. The upper GI endoscopy was                        accomplished without difficulty. The patient tolerated                        the procedure well. The upper GI endoscopy was                        technically difficult and complex due to excessive                        bleeding. Successful completion of the procedure was                        aided by withdrawing the scope and replacing with the                        adult endoscope. The patient tolerated the procedure                        well. Findings:      Two columns of non-bleeding grade II varices were found in the distal       esophagus and at the gastroesophageal junction,. They were 6 mm in       largest diameter. Stigmata of recent bleeding were evident and no red       wale signs were present. The varices appeared unchanged in size from       prior exam. Two bands were successfully placed with complete       eradication, resulting in deflation of varices. There was no bleeding       during, and at the end, of the procedure.      Red  blood was found in the cardia.      Mild portal hypertensive gastropathy was found in the entire examined       stomach.      The examined duodenum was normal. Impression:           - Recently bleeding grade II esophageal varices.                        Completely eradicated. Banded.                       - Red blood in the cardia.                       - Portal hypertensive gastropathy.                       - Normal examined duodenum.                       - No specimens  collected. Recommendation:       - Observe patient's clinical course following today's                        procedure with therapeutic intervention. Procedure Code(s):    --- Professional ---                       [phone], Esophagogastroduodenoscopy, flexible, transoral;                        with band ligation of esophageal/gastric varices Diagnosis Code(s):    --- Professional ---                       I85.01, Esophageal varices with bleeding                       K92.2, Gastrointestinal hemorrhage, unspecified                       K76.6, Portal hypertension                       K92.0, Hematemesis                       K31.89, Other diseases of stomach and duodenum CPT copyright 2016 [employer] Medical Association. All rights reserved. The codes documented in this report are preliminary and upon coder review may  be revised to meet current compliance requirements. Efrain Sella MD, MD 01/02/2017 12:50:02 PM This report has been signed electronically. Number of Addenda: 0 Note Initiated On: 01/02/2017 11:44 AM      [facility]

## 2017-01-02 NOTE — Plan of Care (Signed)
  Progressing Education: Knowledge of General Education information will improve 01/02/2017 1624 - Progressing by Daylene Posey, RN Clinical Measurements: Ability to maintain clinical measurements within normal limits will improve 01/02/2017 1624 - Progressing by Daylene Posey, RN Will remain free from infection 01/02/2017 1624 - Progressing by Daylene Posey, RN Diagnostic test results will improve 01/02/2017 1624 - Progressing by Daylene Posey, RN Respiratory complications will improve 01/02/2017 1624 - Progressing by Daylene Posey, RN Cardiovascular complication will be avoided 01/02/2017 1624 - Progressing by Daylene Posey, RN Nutrition: Adequate nutrition will be maintained 01/02/2017 1624 - Progressing by Daylene Posey, RN Pain Managment: General experience of comfort will improve 01/02/2017 1624 - Progressing by Daylene Posey, RN Safety: Ability to remain free from injury will improve 01/02/2017 1624 - Progressing by Daylene Posey, RN Education: Ability to identify signs and symptoms of gastrointestinal bleeding will improve 01/02/2017 1624 - Progressing by Daylene Posey, RN Bowel/Gastric: Will show no signs and symptoms of gastrointestinal bleeding 01/02/2017 1624 - Progressing by Daylene Posey, RN Fluid Volume: Will show no signs and symptoms of excessive bleeding 01/02/2017 1624 - Progressing by Daylene Posey, RN Clinical Measurements: Complications related to the disease process, condition or treatment will be avoided or minimized 01/02/2017 1624 - Progressing by Daylene Posey, RN

## 2017-01-02 NOTE — Anesthesia Post-op Follow-up Note (Signed)
Anesthesia QCDR form completed.        

## 2017-01-02 NOTE — Anesthesia Procedure Notes (Signed)
Procedure Name: Intubation Date/Time: 01/02/2017 11:58 AM Performed by: Dionne Bucy, CRNA Pre-anesthesia Checklist: Patient identified, Patient being monitored, Timeout performed, Emergency Drugs available and Suction available Patient Re-evaluated:Patient Re-evaluated prior to induction Oxygen Delivery Method: Circle system utilized Preoxygenation: Pre-oxygenation with 100% oxygen Induction Type: IV induction, Rapid sequence and Cricoid Pressure applied Laryngoscope Size: 3 and McGraph Grade View: Grade I Tube type: Oral Tube size: 7.5 mm Number of attempts: 1 Airway Equipment and Method: Stylet Placement Confirmation: ETT inserted through vocal cords under direct vision,  positive ETCO2 and breath sounds checked- equal and bilateral Secured at: 22 cm Tube secured with: Tape Dental Injury: Teeth and Oropharynx as per pre-operative assessment

## 2017-01-03 LAB — BASIC METABOLIC PANEL
ANION GAP: 8 (ref 5–15)
BUN: 25 mg/dL — ABNORMAL HIGH (ref 6–20)
CHLORIDE: 111 mmol/L (ref 101–111)
CO2: 20 mmol/L — AB (ref 22–32)
Calcium: 7.7 mg/dL — ABNORMAL LOW (ref 8.9–10.3)
Creatinine, Ser: 0.98 mg/dL (ref 0.61–1.24)
GFR calc Af Amer: >60 mL/min (ref >60)
GLUCOSE: 151 mg/dL — AB (ref 65–99)
POTASSIUM: 4 mmol/L (ref 3.5–5.1)
Sodium: 139 mmol/L (ref 135–145)

## 2017-01-03 LAB — TYPE AND SCREEN
ABO/RH(D): A POS
ANTIBODY SCREEN: NEGATIVE
UNIT DIVISION: 0
UNIT DIVISION: 0
UNIT DIVISION: 0
Unit division: 0

## 2017-01-03 LAB — BPAM RBC
BLOOD PRODUCT EXPIRATION DATE: 201812272359
Blood Product Expiration Date: 201812122359
Blood Product Expiration Date: 201812192359
Blood Product Expiration Date: 201812202359
ISSUE DATE / TIME: 201812071010
ISSUE DATE / TIME: 201812071010
ISSUE DATE / TIME: 201812071032
ISSUE DATE / TIME: 201812071032
UNIT TYPE AND RH: 5100
Unit Type and Rh: 5100
Unit Type and Rh: 5100
Unit Type and Rh: 9500

## 2017-01-03 LAB — CBC
HEMATOCRIT: 35.5 % — AB (ref 40.0–52.0)
HEMOGLOBIN: 12.3 g/dL — AB (ref 13.0–18.0)
MCH: 30.2 pg (ref 26.0–34.0)
MCHC: 34.7 g/dL (ref 32.0–36.0)
MCV: 87 fL (ref 80.0–100.0)
Platelets: 117 10*3/uL — ABNORMAL LOW (ref 150–440)
RBC: 4.08 MIL/uL — ABNORMAL LOW (ref 4.40–5.90)
RDW: 16.1 % — ABNORMAL HIGH (ref 11.5–14.5)
WBC: 6.6 10*3/uL (ref 3.8–10.6)

## 2017-01-03 LAB — HIV ANTIBODY (ROUTINE TESTING W REFLEX): HIV SCREEN 4TH GENERATION: NONREACTIVE

## 2017-01-03 MED ORDER — PANTOPRAZOLE SODIUM 40 MG PO TBEC: 40.0000 mg | DELAYED_RELEASE_TABLET | Freq: Two times a day (BID) | ORAL | Status: DC

## 2017-01-03 MED ORDER — DOCUSATE SODIUM 100 MG PO CAPS: 100.0000 mg | ORAL_CAPSULE | Freq: Two times a day (BID) | ORAL | 0 refills | Status: DC | PRN

## 2017-01-03 MED ORDER — PROPRANOLOL HCL 10 MG PO TABS: 10.0000 mg | ORAL_TABLET | Freq: Two times a day (BID) | ORAL | 0 refills | Status: DC

## 2017-01-03 MED ORDER — OMEPRAZOLE 40 MG PO CPDR: 40.0000 mg | DELAYED_RELEASE_CAPSULE | Freq: Two times a day (BID) | ORAL | 0 refills | Status: DC

## 2017-01-03 MED ORDER — ONDANSETRON HCL 4 MG/2ML IJ SOLN
4.0000 mg | Freq: Four times a day (QID) | INTRAMUSCULAR | Status: DC | PRN
  Administered 2017-01-03: 09:00:00 4 mg via INTRAVENOUS
  Filled 2017-01-03: qty 2

## 2017-01-03 NOTE — Discharge Summary (Signed)
[place] [facility] [place] NAME: Phillip Warren    MR#:  [id number]  DATE OF BIRTH:  May 02, 1955  DATE OF ADMISSION:  01/02/2017 ADMITTING PHYSICIAN: Vaughan Basta, MD  DATE OF DISCHARGE: 01/03/17   PRIMARY CARE PHYSICIAN: Glendon Axe, MD    ADMISSION DIAGNOSIS:  Acute upper GI bleed [K92.2] Abdominal pain [R10.9]  DISCHARGE DIAGNOSIS:  Active Problems:   GI bleed variceal bleed s/p banding  SECONDARY DIAGNOSIS:   Past Medical History:  Diagnosis Date  . Alcohol abuse   . Anorexia    has trouble eating due to poor appetite  . Anxiety    trouble sleeping; lost two sisters and his mother close together  . Arthritis    hands, feet (burning, stinging)  . Cancer [facility])    liver lesion initial staging.  . CHF (congestive heart failure) ([place])   . Cirrhosis of liver ([place])   . Cocaine abuse ([place])   . COPD (chronic obstructive pulmonary disease) ([place])   . Coronary artery disease   . GERD (gastroesophageal reflux disease)   . H/O dizziness   . Headache   . Hepatitis    B and C (chronic)  . Marijuana use   . Myocardial infarction ([place])    massive MI at age 48  . Neuropathy   . Numbness and tingling    legs and feet bilat   . Pneumonia   . Repeated falls   . Shortness of breath dyspnea   . Tinnitus   . Tobacco abuse     HOSPITAL COURSE:  HISTORY OF PRESENT ILLNESS: Phillip Warren  is a 61 y.o. male with a known history of Alcohol abuse in past, Hep B, liver cirrhosis, CHF, Anxiety, Smoking- Started following at [place] is to start anti hep B treatment soon. Last night vomited at home, and after switching on light, saw it was all blood. So he came to ER. Noted to have Hypotension and tachycardia- but stabilized after IV fluids bolus. Initial Hb was 12.6. Again in ER- Vomited once around 600 ml fresh blood- so ER physician started on Octreotide and Protonix drip and spoke to Oncall GI. Ordered 2 units PRBC by  ER. Pt denies recent drinking- said he quit for few months.  Hypovolemia due to active upper GI bleed secondary to variceal bleed Hypovolemia and GI bleed resolved    IV octreotide and protonix given during the hospital course 2 units of blood transfusion was given and today's hemoglobin is stable at 12   patient is hemodynamically stable today Denies any abdominal pain Status post EGD, diagnosed with the esophageal varices status post banding 2 Patient wants to leave the hospital as son got into a motor vehicle accident , but admitted to the hospital. Discussed with gastroenterology okay to discharge patient from GI standpoint as patient is not actively bleeding Repeat EGD in 4-6 weeks Continue nadolol and Protonix twice a day  * Hep B- liver cirrhosis   Follows at [place] as scheduled on December 17  * active smoking   Counseled to quit for 4 min, offered nicotine patch.  * Anxiety    Cont to monitor, if more anxious, may need some IV ativan.        DISCHARGE CONDITIONS:  Fair   CONSULTS OBTAINED:  Treatment Team:  Efrain Sella, MD Lin Landsman, MD   PROCEDURES  EGD  DRUG ALLERGIES:   Allergies  Allergen Reactions  . Multihance [Gadobenate]  Hives and Shortness Of Breath  . Hydrocodone Itching and Nausea And Vomiting  . Vicodin [Hydrocodone-Acetaminophen] Nausea Only and Nausea And Vomiting    Vomiting     DISCHARGE MEDICATIONS:   Allergies as of 01/03/2017      Reactions   Multihance [gadobenate] Hives, Shortness Of Breath   Hydrocodone Itching, Nausea And Vomiting   Vicodin [hydrocodone-acetaminophen] Nausea Only, Nausea And Vomiting   Vomiting      Medication List    STOP taking these medications   escitalopram 10 MG tablet Commonly known as:  LEXAPRO   sildenafil 20 MG tablet Commonly known as:  REVATIO     TAKE these medications   diphenhydrAMINE 25 MG tablet Commonly known as:  BENADRYL Take 1 tablet (25 mg total) by  mouth every 6 (six) hours as needed.   docusate sodium 100 MG capsule Commonly known as:  COLACE Take 1 capsule (100 mg total) by mouth 2 (two) times daily as needed for mild constipation.   gabapentin 800 MG tablet Commonly known as:  NEURONTIN Take 800 mg by mouth 4 (four) times daily.   LYRICA 50 MG capsule Generic drug:  pregabalin Take 1 capsule by mouth 3 (three) times daily.   nitroGLYCERIN 0.4 MG SL tablet Commonly known as:  NITROSTAT Place 0.4 mg under the tongue every 5 (five) minutes as needed for chest pain.   nortriptyline 10 MG capsule Commonly known as:  PAMELOR Take 1-2 capsules by mouth at bedtime. Take 1 capsule (10mg ) by mouth at every night for 1 week then increase to 2 capsules (20mg ) by mouth afterwards   omeprazole 40 MG capsule Commonly known as:  PRILOSEC Take 1 capsule (40 mg total) by mouth 2 (two) times daily at 10 AM and 5 PM.   propranolol 10 MG tablet Commonly known as:  INDERAL Take 1 tablet (10 mg total) by mouth 2 (two) times daily.   tamsulosin 0.4 MG Caps capsule Commonly known as:  FLOMAX Take 1 capsule (0.4 mg total) by mouth daily.   traMADol 50 MG tablet Commonly known as:  ULTRAM Take 1 tablet by mouth 2 (two) times daily.        DISCHARGE INSTRUCTIONS:   Follow-up with primary care physician in a week Follow-up with gastroenterology in 4 weeks for repeat EGD Follow-up with [facility] hepatology Dr. Posey Pronto on December 17 as scheduled  DIET:  Cardiac diet  DISCHARGE CONDITION:  Fair  ACTIVITY:  Activity as tolerated  OXYGEN:  Home Oxygen: No.   Oxygen Delivery: room air  DISCHARGE LOCATION:  home   If you experience worsening of your admission symptoms, develop shortness of breath, life threatening emergency, suicidal or homicidal thoughts you must seek medical attention immediately by calling 911 or calling your MD immediately  if symptoms less severe.  You Must read complete instructions/literature along with all  the possible adverse reactions/side effects for all the Medicines you take and that have been prescribed to you. Take any new Medicines after you have completely understood and accpet all the possible adverse reactions/side effects.   Please note  You were cared for by a hospitalist during your hospital stay. If you have any questions about your discharge medications or the care you received while you were in the hospital after you are discharged, you can call the unit and asked to speak with the hospitalist on call if the hospitalist that took care of you is not available. Once you are discharged, your primary care physician will handle  any further medical issues. Please note that NO REFILLS for any discharge medications will be authorized once you are discharged, as it is imperative that you return to your primary care physician (or establish a relationship with a primary care physician if you do not have one) for your aftercare needs so that they can reassess your need for medications and monitor your lab values.     Today  Chief Complaint  Patient presents with  . Abdominal Pain  . Hematemesis   Pt is feeling better. Denies any bleeding. Patient's Son is involved in a motor vehicle accident and got admitted to the hospital and patient wants to be discharged ASAP today. Denies any abdominal pain. Discussed with gastroenterology and will get a discharge patient from their standpoint  ROS:  CONSTITUTIONAL: Denies fevers, chills. Denies any fatigue, weakness.  EYES: Denies blurry vision, double vision, eye pain. EARS, NOSE, THROAT: Denies tinnitus, ear pain, hearing loss. RESPIRATORY: Denies cough, wheeze, shortness of breath.  CARDIOVASCULAR: Denies chest pain, palpitations, edema.  GASTROINTESTINAL: Denies nausea, vomiting, diarrhea, abdominal pain. Denies bright red blood per rectum. GENITOURINARY: Denies dysuria, hematuria. ENDOCRINE: Denies nocturia or thyroid problems. HEMATOLOGIC AND  LYMPHATIC: Denies easy bruising or bleeding. SKIN: Denies rash or lesion. MUSCULOSKELETAL: Denies pain in neck, back, shoulder, knees, hips or arthritic symptoms.  NEUROLOGIC: Denies paralysis, paresthesias.  PSYCHIATRIC: Denies anxiety or depressive symptoms.   VITAL SIGNS:  Blood pressure 132/71, pulse 74, temperature (!) 97.5 F (36.4 C), temperature source Oral, resp. rate 18, height 6' (1.829 m), weight 64.4 kg (142 lb 1 oz), SpO2 97 %.  I/O:    Intake/Output Summary (Last 24 hours) at 01/03/2017 1004 Last data filed at 01/03/2017 0536 Gross per 24 hour  Intake 3493.67 ml  Output 2500 ml  Net 993.67 ml    PHYSICAL EXAMINATION:  GENERAL:  61 y.o.-year-old patient lying in the bed with no acute distress.  EYES: Pupils equal, round, reactive to light and accommodation. No scleral icterus. Extraocular muscles intact.  HEENT: Head atraumatic, normocephalic. Oropharynx and nasopharynx clear.  NECK:  Supple, no jugular venous distention. No thyroid enlargement, no tenderness.  LUNGS: Normal breath sounds bilaterally, no wheezing, rales,rhonchi or crepitation. No use of accessory muscles of respiration.  CARDIOVASCULAR: S1, S2 normal. No murmurs, rubs, or gallops.  ABDOMEN: Soft, non-tender, non-distended. Bowel sounds present.  EXTREMITIES: No pedal edema, cyanosis, or clubbing.  NEUROLOGIC: Cranial nerves II through XII are intact. Muscle strength 5/5 in all extremities. Sensation intact. Gait not checked.  PSYCHIATRIC: The patient is alert and oriented x 3.  SKIN: No obvious rash, lesion, or ulcer.   DATA REVIEW:   CBC Recent Labs  Lab 01/03/17 0240  WBC 6.6  HGB 12.3*  HCT 35.5*  PLT 117*    Chemistries  Recent Labs  Lab 01/02/17 0853 01/03/17 0240  NA 136 139  K 4.7 4.0  CL 103 111  CO2 23 20*  GLUCOSE 176* 151*  BUN 23* 25*  CREATININE 1.16 0.98  CALCIUM 8.9 7.7*  AST 58*  --   ALT 51  --   ALKPHOS 80  --   BILITOT 1.0  --     Cardiac Enzymes Recent  Labs  Lab 01/02/17 0853  TROPONINI <0.03    Microbiology Results  Results for orders placed or performed in visit on 08/19/16  Microscopic Examination     Status: None   Collection Time: 08/19/16  9:49 AM  Result Value Ref Range Status   WBC, UA 0-5  0 - 5 /hpf Final   RBC, UA None seen 0 - 2 /hpf Final   Epithelial Cells (non renal) 0-10 0 - 10 /hpf Final   Bacteria, UA None seen None seen/Few Final    RADIOLOGY:  Dg Abd 1 View  Result Date: 01/02/2017 CLINICAL DATA:  Abdominal pain and hematemesis. EXAM: ABDOMEN - 1 VIEW COMPARISON:  11/11/2014 FINDINGS: Bowel gas pattern is normal. Calcified gallstones noted in the gallbladder. No other abnormal calcifications. Mild curvature in degenerative change of the spine. IMPRESSION: Gallstones.  Otherwise negative. Electronically Signed   By: Nelson Chimes M.D.   On: 01/02/2017 11:05   Dg Chest Port 1 View  Result Date: 01/02/2017 CLINICAL DATA:  Epigastric pain, hematemesis EXAM: PORTABLE CHEST 1 VIEW COMPARISON:  CT chest 08/30/2014 FINDINGS: The heart size and mediastinal contours are within normal limits. Both lungs are clear. The visualized skeletal structures are unremarkable. IMPRESSION: No active disease. Electronically Signed   By: Kathreen Devoid   On: 01/02/2017 11:07    EKG:   Orders placed or performed during the hospital encounter of 01/02/17  . EKG 12-Lead  . EKG 12-Lead      Management plans discussed with the patient, family and they are in agreement.  CODE STATUS:     Code Status Orders  (From admission, onward)        Start     Ordered   01/02/17 1411  Full code  Continuous     01/02/17 1410    Code Status History    Date Active Date Inactive Code Status Order ID Comments User Context   This patient has a current code status but no historical code status.      TOTAL TIME TAKING CARE OF THIS PATIENT: 43  minutes.   Note: This dictation was prepared with Dragon dictation along with smaller phrase  technology. Any transcriptional errors that result from this process are unintentional.   @[facility] @  on 01/03/2017 at 10:04 AM  Between 7am to 6pm - Pager - [phone]  After 6pm go to [email or web address] - password [email or web address] [facility]  [facility]  Office  [phone]  CC: Primary care physician; Glendon Axe, MD

## 2017-01-03 NOTE — Progress Notes (Signed)
Discharge instructions along with home medications and follow up gone over with patient. He verbalized that he understood instructions. 2 prescriptions given to patient. IV's removed. Pt being discharged home on room air, no distress noted. Pt refused wheelchair, wished to ambulate to visitors entrance where he ride awaits. Ammie Dalton, RN

## 2017-01-03 NOTE — Discharge Instructions (Signed)
Follow-up with primary care physician in a week Follow-up with gastroenterology in 4 weeks for repeat EGD Follow-up with [facility] hepatology Dr. Posey Pronto on December 17 as scheduled

## 2017-01-05 ENCOUNTER — Encounter: Payer: Self-pay | Admitting: Internal Medicine

## 2017-01-13 ENCOUNTER — Encounter: Payer: Self-pay | Admitting: Internal Medicine

## 2017-03-09 ENCOUNTER — Other Ambulatory Visit: Payer: Self-pay

## 2017-03-09 ENCOUNTER — Encounter
Admission: EM | Disposition: A | Discharge status: Home / Self Care | Payer: Self-pay | Source: Home / Self Care | Attending: Internal Medicine

## 2017-03-09 ENCOUNTER — Inpatient Hospital Stay
Admission: EM | Admit: 2017-03-09 | Discharge: 2017-03-12 | DRG: 246 | Disposition: A | Discharge status: Home / Self Care | Payer: Medicare Other | Attending: Internal Medicine | Admitting: Internal Medicine

## 2017-03-09 DIAGNOSIS — I340 Nonrheumatic mitral (valve) insufficiency: Secondary | ICD-10-CM | POA: Diagnosis not present

## 2017-03-09 DIAGNOSIS — F1021 Alcohol dependence, in remission: Secondary | ICD-10-CM | POA: Diagnosis present

## 2017-03-09 DIAGNOSIS — I255 Ischemic cardiomyopathy: Secondary | ICD-10-CM | POA: Diagnosis present

## 2017-03-09 DIAGNOSIS — N179 Acute kidney failure, unspecified: Secondary | ICD-10-CM | POA: Diagnosis present

## 2017-03-09 DIAGNOSIS — I2111 ST elevation (STEMI) myocardial infarction involving right coronary artery: Secondary | ICD-10-CM | POA: Diagnosis present

## 2017-03-09 DIAGNOSIS — R630 Anorexia: Secondary | ICD-10-CM | POA: Diagnosis present

## 2017-03-09 DIAGNOSIS — Z8719 Personal history of other diseases of the digestive system: Secondary | ICD-10-CM

## 2017-03-09 DIAGNOSIS — I959 Hypotension, unspecified: Secondary | ICD-10-CM | POA: Diagnosis present

## 2017-03-09 DIAGNOSIS — K219 Gastro-esophageal reflux disease without esophagitis: Secondary | ICD-10-CM | POA: Diagnosis present

## 2017-03-09 DIAGNOSIS — I213 ST elevation (STEMI) myocardial infarction of unspecified site: Secondary | ICD-10-CM

## 2017-03-09 DIAGNOSIS — E875 Hyperkalemia: Secondary | ICD-10-CM | POA: Diagnosis present

## 2017-03-09 DIAGNOSIS — B1910 Unspecified viral hepatitis B without hepatic coma: Secondary | ICD-10-CM | POA: Diagnosis present

## 2017-03-09 DIAGNOSIS — I25110 Atherosclerotic heart disease of native coronary artery with unstable angina pectoris: Secondary | ICD-10-CM | POA: Diagnosis present

## 2017-03-09 DIAGNOSIS — K7460 Unspecified cirrhosis of liver: Secondary | ICD-10-CM | POA: Diagnosis not present

## 2017-03-09 DIAGNOSIS — Z6820 Body mass index (BMI) 20.0-20.9, adult: Secondary | ICD-10-CM | POA: Diagnosis not present

## 2017-03-09 DIAGNOSIS — K5900 Constipation, unspecified: Secondary | ICD-10-CM | POA: Diagnosis present

## 2017-03-09 DIAGNOSIS — K7030 Alcoholic cirrhosis of liver without ascites: Secondary | ICD-10-CM | POA: Diagnosis present

## 2017-03-09 DIAGNOSIS — F419 Anxiety disorder, unspecified: Secondary | ICD-10-CM | POA: Diagnosis present

## 2017-03-09 DIAGNOSIS — I2119 ST elevation (STEMI) myocardial infarction involving other coronary artery of inferior wall: Principal | ICD-10-CM | POA: Diagnosis present

## 2017-03-09 DIAGNOSIS — R079 Chest pain, unspecified: Secondary | ICD-10-CM | POA: Diagnosis present

## 2017-03-09 DIAGNOSIS — I2510 Atherosclerotic heart disease of native coronary artery without angina pectoris: Secondary | ICD-10-CM | POA: Diagnosis not present

## 2017-03-09 DIAGNOSIS — I5021 Acute systolic (congestive) heart failure: Secondary | ICD-10-CM | POA: Diagnosis present

## 2017-03-09 DIAGNOSIS — Z8505 Personal history of malignant neoplasm of liver: Secondary | ICD-10-CM

## 2017-03-09 DIAGNOSIS — Z9861 Coronary angioplasty status: Secondary | ICD-10-CM

## 2017-03-09 DIAGNOSIS — F1010 Alcohol abuse, uncomplicated: Secondary | ICD-10-CM | POA: Diagnosis not present

## 2017-03-09 DIAGNOSIS — B1920 Unspecified viral hepatitis C without hepatic coma: Secondary | ICD-10-CM | POA: Diagnosis present

## 2017-03-09 DIAGNOSIS — J811 Chronic pulmonary edema: Secondary | ICD-10-CM

## 2017-03-09 DIAGNOSIS — E785 Hyperlipidemia, unspecified: Secondary | ICD-10-CM | POA: Diagnosis present

## 2017-03-09 DIAGNOSIS — J449 Chronic obstructive pulmonary disease, unspecified: Secondary | ICD-10-CM | POA: Diagnosis present

## 2017-03-09 DIAGNOSIS — Z8249 Family history of ischemic heart disease and other diseases of the circulatory system: Secondary | ICD-10-CM

## 2017-03-09 DIAGNOSIS — Z885 Allergy status to narcotic agent status: Secondary | ICD-10-CM

## 2017-03-09 DIAGNOSIS — F1411 Cocaine abuse, in remission: Secondary | ICD-10-CM | POA: Diagnosis present

## 2017-03-09 DIAGNOSIS — I252 Old myocardial infarction: Secondary | ICD-10-CM | POA: Diagnosis not present

## 2017-03-09 DIAGNOSIS — F17210 Nicotine dependence, cigarettes, uncomplicated: Secondary | ICD-10-CM | POA: Diagnosis present

## 2017-03-09 DIAGNOSIS — E876 Hypokalemia: Secondary | ICD-10-CM | POA: Diagnosis present

## 2017-03-09 DIAGNOSIS — Z888 Allergy status to other drugs, medicaments and biological substances status: Secondary | ICD-10-CM

## 2017-03-09 DIAGNOSIS — Z801 Family history of malignant neoplasm of trachea, bronchus and lung: Secondary | ICD-10-CM

## 2017-03-09 DIAGNOSIS — G629 Polyneuropathy, unspecified: Secondary | ICD-10-CM | POA: Diagnosis present

## 2017-03-09 HISTORY — PX: CORONARY/GRAFT ACUTE MI REVASCULARIZATION: CATH118305

## 2017-03-09 HISTORY — PX: LEFT HEART CATH AND CORONARY ANGIOGRAPHY: CATH118249

## 2017-03-09 LAB — POCT ACTIVATED CLOTTING TIME
ACTIVATED CLOTTING TIME: 219 s
ACTIVATED CLOTTING TIME: 252 s

## 2017-03-09 LAB — CBC WITH DIFFERENTIAL/PLATELET
BASOS ABS: 0 10*3/uL (ref 0–0.1)
Basophils Relative: 1 %
Eosinophils Absolute: 0.7 10*3/uL (ref 0–0.7)
Eosinophils Relative: 8 %
HEMATOCRIT: 41.9 % (ref 40.0–52.0)
Hemoglobin: 14 g/dL (ref 13.0–18.0)
LYMPHS PCT: 30 %
Lymphs Abs: 2.6 10*3/uL (ref 1.0–3.6)
MCH: 29.8 pg (ref 26.0–34.0)
MCHC: 33.4 g/dL (ref 32.0–36.0)
MCV: 89.2 fL (ref 80.0–100.0)
MONO ABS: 0.5 10*3/uL (ref 0.2–1.0)
Monocytes Relative: 6 %
NEUTROS ABS: 4.7 10*3/uL (ref 1.4–6.5)
Neutrophils Relative %: 55 %
Platelets: 179 10*3/uL (ref 150–440)
RBC: 4.7 MIL/uL (ref 4.40–5.90)
RDW: 15.1 % — AB (ref 11.5–14.5)
WBC: 8.5 10*3/uL (ref 3.8–10.6)

## 2017-03-09 LAB — COMPREHENSIVE METABOLIC PANEL
ALBUMIN: 3.8 g/dL (ref 3.5–5.0)
ALT: 108 U/L — ABNORMAL HIGH (ref 17–63)
AST: 116 U/L — AB (ref 15–41)
Alkaline Phosphatase: 105 U/L (ref 38–126)
Anion gap: 12 (ref 5–15)
BUN: 9 mg/dL (ref 6–20)
CHLORIDE: 98 mmol/L — AB (ref 101–111)
CO2: 23 mmol/L (ref 22–32)
Calcium: 9.1 mg/dL (ref 8.9–10.3)
Creatinine, Ser: 0.97 mg/dL (ref 0.61–1.24)
GFR calc Af Amer: >60 mL/min (ref >60)
GLUCOSE: 130 mg/dL — AB (ref 65–99)
Potassium: 3.4 mmol/L — ABNORMAL LOW (ref 3.5–5.1)
SODIUM: 133 mmol/L — AB (ref 135–145)
Total Bilirubin: 0.8 mg/dL (ref 0.3–1.2)
Total Protein: 9 g/dL — ABNORMAL HIGH (ref 6.5–8.1)

## 2017-03-09 LAB — PROTIME-INR
INR: 1.16
Prothrombin Time: 14.7 seconds (ref 11.4–15.2)

## 2017-03-09 LAB — TROPONIN I: Troponin I: 0.26 ng/mL (ref <0.03)

## 2017-03-09 LAB — GLUCOSE, CAPILLARY: GLUCOSE-CAPILLARY: 157 mg/dL — AB (ref 65–99)

## 2017-03-09 LAB — APTT: APTT: 31 s (ref 24–36)

## 2017-03-09 LAB — LIPID PANEL
CHOL/HDL RATIO: 5.3 ratio
Cholesterol: 192 mg/dL (ref 0–200)
HDL: 36 mg/dL — ABNORMAL LOW (ref >40)
LDL Cholesterol: 135 mg/dL — ABNORMAL HIGH (ref 0–99)
Triglycerides: 105 mg/dL (ref <150)
VLDL: 21 mg/dL (ref 0–40)

## 2017-03-09 LAB — MRSA PCR SCREENING: MRSA by PCR: NEGATIVE

## 2017-03-09 SURGERY — CORONARY/GRAFT ACUTE MI REVASCULARIZATION
Anesthesia: Moderate Sedation

## 2017-03-09 MED ORDER — ONDANSETRON HCL 4 MG/2ML IJ SOLN
INTRAMUSCULAR | Status: AC
  Administered 2017-03-09: 4 mg via INTRAVENOUS
  Filled 2017-03-09: qty 2

## 2017-03-09 MED ORDER — SODIUM CHLORIDE 0.9 % WEIGHT BASED INFUSION
1.0000 mL/kg/h | INTRAVENOUS | Status: DC
  Administered 2017-03-09 – 2017-03-10 (×2): 1 mL/kg/h via INTRAVENOUS

## 2017-03-09 MED ORDER — ASPIRIN 81 MG PO CHEW
324.0000 mg | CHEWABLE_TABLET | Freq: Once | ORAL | Status: AC
  Administered 2017-03-09: 324 mg via ORAL

## 2017-03-09 MED ORDER — TIROFIBAN HCL IN NACL 5-0.9 MG/100ML-% IV SOLN
0.1500 ug/kg/min | INTRAVENOUS | Status: DC
  Filled 2017-03-09: qty 100

## 2017-03-09 MED ORDER — TIROFIBAN HCL IV 12.5 MG/250 ML
INTRAVENOUS | Status: AC
Start: 2017-03-09 — End: ?
  Filled 2017-03-09: qty 250

## 2017-03-09 MED ORDER — TIROFIBAN HCL IV 12.5 MG/250 ML
INTRAVENOUS | Status: AC | PRN
  Administered 2017-03-09: 0.15 ug/kg/min via INTRAVENOUS

## 2017-03-09 MED ORDER — LIDOCAINE HCL (PF) 1 % IJ SOLN
INTRAMUSCULAR | Status: DC | PRN
  Administered 2017-03-09: 21:00:00

## 2017-03-09 MED ORDER — ATORVASTATIN CALCIUM 20 MG PO TABS
40.0000 mg | ORAL_TABLET | Freq: Every day | ORAL | Status: DC
  Administered 2017-03-10 – 2017-03-11 (×2): 40 mg via ORAL
  Filled 2017-03-09 (×2): qty 2

## 2017-03-09 MED ORDER — ONDANSETRON HCL 4 MG/2ML IJ SOLN
4.0000 mg | Freq: Four times a day (QID) | INTRAMUSCULAR | Status: DC | PRN
  Administered 2017-03-10 – 2017-03-11 (×2): 4 mg via INTRAVENOUS
  Filled 2017-03-09 (×2): qty 2

## 2017-03-09 MED ORDER — ASPIRIN 81 MG PO CHEW
81.0000 mg | CHEWABLE_TABLET | Freq: Every day | ORAL | Status: DC
  Administered 2017-03-10 – 2017-03-12 (×3): 81 mg via ORAL
  Filled 2017-03-09 (×4): qty 1

## 2017-03-09 MED ORDER — SODIUM CHLORIDE 0.9 % IV SOLN: 250.0000 mL | INTRAVENOUS | Status: DC | PRN

## 2017-03-09 MED ORDER — IOPAMIDOL (ISOVUE-300) INJECTION 61%
INTRAVENOUS | Status: DC | PRN
  Administered 2017-03-09: 165 mL

## 2017-03-09 MED ORDER — GABAPENTIN 400 MG PO CAPS
800.0000 mg | ORAL_CAPSULE | Freq: Four times a day (QID) | ORAL | Status: DC
  Administered 2017-03-09 – 2017-03-12 (×10): 800 mg via ORAL
  Filled 2017-03-09 (×10): qty 2

## 2017-03-09 MED ORDER — TAMSULOSIN HCL 0.4 MG PO CAPS
0.4000 mg | ORAL_CAPSULE | Freq: Every day | ORAL | Status: DC
  Administered 2017-03-09 – 2017-03-10 (×2): 0.4 mg via ORAL
  Filled 2017-03-09 (×3): qty 1

## 2017-03-09 MED ORDER — VERAPAMIL HCL 2.5 MG/ML IV SOLN
INTRAVENOUS | Status: AC
  Filled 2017-03-09: qty 2

## 2017-03-09 MED ORDER — MORPHINE SULFATE (PF) 4 MG/ML IV SOLN
INTRAVENOUS | Status: AC
  Administered 2017-03-09: 4 mg via INTRAVENOUS
  Filled 2017-03-09: qty 1

## 2017-03-09 MED ORDER — SODIUM CHLORIDE 0.9% FLUSH
3.0000 mL | Freq: Two times a day (BID) | INTRAVENOUS | Status: DC
  Administered 2017-03-10 – 2017-03-12 (×5): 3 mL via INTRAVENOUS

## 2017-03-09 MED ORDER — ONDANSETRON HCL 4 MG/2ML IJ SOLN
4.0000 mg | Freq: Once | INTRAMUSCULAR | Status: AC
  Administered 2017-03-09: 4 mg via INTRAVENOUS

## 2017-03-09 MED ORDER — CLOPIDOGREL BISULFATE 75 MG PO TABS
75.0000 mg | ORAL_TABLET | Freq: Every day | ORAL | Status: DC
  Administered 2017-03-10 – 2017-03-12 (×3): 75 mg via ORAL
  Filled 2017-03-09 (×3): qty 1

## 2017-03-09 MED ORDER — MIDAZOLAM HCL 2 MG/2ML IJ SOLN
INTRAMUSCULAR | Status: AC
  Filled 2017-03-09: qty 2

## 2017-03-09 MED ORDER — SODIUM CHLORIDE 0.9% FLUSH
3.0000 mL | INTRAVENOUS | Status: DC | PRN
  Administered 2017-03-12: 3 mL via INTRAVENOUS
  Filled 2017-03-09: qty 3

## 2017-03-09 MED ORDER — FENTANYL CITRATE (PF) 100 MCG/2ML IJ SOLN
INTRAMUSCULAR | Status: AC
  Filled 2017-03-09: qty 2

## 2017-03-09 MED ORDER — SODIUM CHLORIDE 0.9 % IV SOLN
INTRAVENOUS | Status: DC
  Administered 2017-03-09: 1000 mL via INTRAVENOUS

## 2017-03-09 MED ORDER — TIROFIBAN HCL IN NACL 5-0.9 MG/100ML-% IV SOLN
0.1500 ug/kg/min | INTRAVENOUS | Status: DC
  Administered 2017-03-09: 0.15 ug/kg/min via INTRAVENOUS
  Filled 2017-03-09: qty 100

## 2017-03-09 MED ORDER — TIROFIBAN (AGGRASTAT) BOLUS VIA INFUSION
INTRAVENOUS | Status: DC | PRN
  Administered 2017-03-09: 1587.5 ug via INTRAVENOUS

## 2017-03-09 MED ORDER — CLOPIDOGREL BISULFATE 300 MG PO TABS
ORAL_TABLET | ORAL | Status: AC
  Filled 2017-03-09: qty 2

## 2017-03-09 MED ORDER — FENTANYL CITRATE (PF) 100 MCG/2ML IJ SOLN
INTRAMUSCULAR | Status: DC | PRN
  Administered 2017-03-09: 50 ug via INTRAVENOUS

## 2017-03-09 MED ORDER — HEPARIN SODIUM (PORCINE) 5000 UNIT/ML IJ SOLN
60.0000 [IU]/kg | Freq: Once | INTRAMUSCULAR | Status: AC
  Administered 2017-03-09: 4000 [IU] via INTRAVENOUS

## 2017-03-09 MED ORDER — HEPARIN SODIUM (PORCINE) 1000 UNIT/ML IJ SOLN
INTRAMUSCULAR | Status: AC
  Filled 2017-03-09: qty 1

## 2017-03-09 MED ORDER — METOPROLOL TARTRATE 25 MG PO TABS
25.0000 mg | ORAL_TABLET | Freq: Two times a day (BID) | ORAL | Status: DC
  Administered 2017-03-09: 25 mg via ORAL
  Filled 2017-03-09: qty 1

## 2017-03-09 MED ORDER — TRAMADOL HCL 50 MG PO TABS: 50.0000 mg | ORAL_TABLET | Freq: Two times a day (BID) | ORAL | Status: DC

## 2017-03-09 MED ORDER — HEPARIN SODIUM (PORCINE) 1000 UNIT/ML IJ SOLN
INTRAMUSCULAR | Status: DC | PRN
  Administered 2017-03-09: 3000 [IU] via INTRAVENOUS
  Administered 2017-03-09: 1500 [IU] via INTRAVENOUS

## 2017-03-09 MED ORDER — MIDAZOLAM HCL 2 MG/2ML IJ SOLN
INTRAMUSCULAR | Status: DC | PRN
  Administered 2017-03-09: 1 mg via INTRAVENOUS

## 2017-03-09 MED ORDER — DOCUSATE SODIUM 100 MG PO CAPS
100.0000 mg | ORAL_CAPSULE | Freq: Two times a day (BID) | ORAL | Status: DC | PRN
  Filled 2017-03-09: qty 1

## 2017-03-09 MED ORDER — VERAPAMIL HCL 2.5 MG/ML IV SOLN
INTRAVENOUS | Status: DC | PRN
  Administered 2017-03-09: 2.5 mg via INTRA_ARTERIAL

## 2017-03-09 MED ORDER — CLOPIDOGREL BISULFATE 75 MG PO TABS
ORAL_TABLET | ORAL | Status: DC | PRN
  Administered 2017-03-09: 600 mg via ORAL

## 2017-03-09 MED ORDER — MORPHINE SULFATE (PF) 4 MG/ML IV SOLN
4.0000 mg | Freq: Once | INTRAVENOUS | Status: AC
  Administered 2017-03-09: 4 mg via INTRAVENOUS

## 2017-03-09 MED ORDER — NITROGLYCERIN 0.4 MG SL SUBL: 0.4000 mg | SUBLINGUAL_TABLET | SUBLINGUAL | Status: DC | PRN

## 2017-03-09 MED ORDER — MORPHINE SULFATE (PF) 2 MG/ML IV SOLN
1.0000 mg | INTRAVENOUS | Status: DC | PRN
  Administered 2017-03-09: 1 mg via INTRAVENOUS
  Filled 2017-03-09: qty 1

## 2017-03-09 MED ORDER — PANTOPRAZOLE SODIUM 40 MG PO TBEC
40.0000 mg | DELAYED_RELEASE_TABLET | Freq: Every day | ORAL | Status: DC
  Administered 2017-03-09 – 2017-03-12 (×4): 40 mg via ORAL
  Filled 2017-03-09 (×4): qty 1

## 2017-03-09 SURGICAL SUPPLY — 22 items
BALLN TREK OTW 2.5X12 (BALLOONS)
BALLN TREK RX 2.5X15 (BALLOONS) ×3
BALLN TREK RX 3.0X20 (BALLOONS) ×3
BALLN ~~LOC~~ EUPHORA RX 3.25X15 (BALLOONS) ×3
BALLN ~~LOC~~ TREK RX 3.5X8 (BALLOONS) ×3
BALLOON TREK OTW 2.5X12 (BALLOONS) IMPLANT
BALLOON TREK RX 2.5X15 (BALLOONS) ×1 IMPLANT
BALLOON TREK RX 3.0X20 (BALLOONS) ×1 IMPLANT
BALLOON ~~LOC~~ EUPHORA RX 3.25X15 (BALLOONS) ×1 IMPLANT
BALLOON ~~LOC~~ TREK RX 3.5X8 (BALLOONS) ×1 IMPLANT
CATH INFINITI 5 FR JL3.5 (CATHETERS) ×3 IMPLANT
CATH PRIORITY ONE AC 6F (CATHETERS) ×3 IMPLANT
CATH VISTA GUIDE 6FR JR4 (CATHETERS) ×3 IMPLANT
DEVICE INFLAT 30 PLUS (MISCELLANEOUS) ×3 IMPLANT
DEVICE RAD TR BAND REGULAR (VASCULAR PRODUCTS) ×3 IMPLANT
GLIDESHEATH SLEND SS 6F .021 (SHEATH) ×3 IMPLANT
KIT MANI 3VAL PERCEP (MISCELLANEOUS) ×3 IMPLANT
PACK CARDIAC CATH (CUSTOM PROCEDURE TRAY) ×3 IMPLANT
STENT SIERRA 3.25 X 23 MM (Permanent Stent) ×3 IMPLANT
STENT SIERRA 3.50 X 18 MM (Permanent Stent) ×3 IMPLANT
WIRE INTUITION PROPEL ST 180CM (WIRE) ×3 IMPLANT
WIRE RUNTHROUGH .014X180CM (WIRE) ×3 IMPLANT

## 2017-03-09 NOTE — ED Triage Notes (Addendum)
Per EMS pt with CP for the last week, increased in pain tonight. Pt reports substernal CP with radiation to left arm and back. Pt states hx of MI 20 yrs ago. Pt took 3 nitros without relief. STEMI called with EMS showing inferior MI. EDP in rm.

## 2017-03-09 NOTE — Progress Notes (Signed)
   03/09/17 2100  Clinical Encounter Type  Visited With Family  Visit Type Initial;Social support;Patient in surgery;Critical Care  Referral From Nurse  Consult/Referral To [facility] responded to page from [facility] that pts daughters were in waiting area.  Daughters explained that they are not biologically related, and their mother has since split from pt.  They shared that they had a rough upbringing and they were the only people who were willing to care for him.  Chaplain provided active listening and emotional support as they told psychosocial history.  Rev. [place]

## 2017-03-09 NOTE — Progress Notes (Signed)
   03/09/17 1850  Clinical Encounter Type  Visited With Patient not available;Health care provider  Visit Type Initial  Referral From Nurse  Consult/Referral To Chaplain   Chaplain responded to code stemi.  No family present.    [place]

## 2017-03-09 NOTE — H&P (Signed)
[facility] NAME: Phillip Warren    MR#:  [id number]  DATE OF BIRTH:  09-27-1955  DATE OF ADMISSION:  03/09/2017  PRIMARY CARE PHYSICIAN: Glendon Axe, MD   REQUESTING/REFERRING PHYSICIAN: Fletcher Anon, MD  CHIEF COMPLAINT:  Chest pain  HISTORY OF PRESENT ILLNESS:  Phillip Warren  is a 62 y.o. male who presents with chest pain.  He was found to have inferior STEMI in the ED and was taken to Cath Lab for emergent catheterization.  2 stents placed by cardiology and hospitalist called for admission.  PAST MEDICAL HISTORY:   Past Medical History:  Diagnosis Date  . Alcohol abuse   . Anorexia    has trouble eating due to poor appetite  . Anxiety    trouble sleeping; lost two sisters and his mother close together  . Arthritis    hands, feet (burning, stinging)  . Cancer [facility])    liver lesion initial staging.  . CHF (congestive heart failure) ([place])   . Cirrhosis of liver ([place])   . Cocaine abuse ([place])   . COPD (chronic obstructive pulmonary disease) ([place])   . Coronary artery disease   . GERD (gastroesophageal reflux disease)   . H/O dizziness   . Headache   . Hepatitis    B and C (chronic)  . Marijuana use   . Myocardial infarction ([place])    massive MI at age 89  . Neuropathy   . Numbness and tingling    legs and feet bilat   . Pneumonia   . Repeated falls   . Shortness of breath dyspnea   . Tinnitus   . Tobacco abuse     PAST SURGICAL HISTORY:   Past Surgical History:  Procedure Laterality Date  . CARDIAC SURGERY    . COLONOSCOPY WITH PROPOFOL N/A 08/31/2014   Procedure: COLONOSCOPY WITH PROPOFOL;  Surgeon: Josefine Class, MD;  Location[facility] ENDOSCOPY;  Service: Endoscopy;  Laterality: N/A;  . CORONARY ANGIOPLASTY     at age 54 secondary to massive MI  . ESOPHAGOGASTRODUODENOSCOPY (EGD) WITH PROPOFOL N/A 08/31/2014   Procedure: ESOPHAGOGASTRODUODENOSCOPY (EGD) WITH PROPOFOL;  Surgeon: Josefine Class,  MD;  Location: [facility] ENDOSCOPY;  Service: Endoscopy;  Laterality: N/A;  . ESOPHAGOGASTRODUODENOSCOPY (EGD) WITH PROPOFOL N/A 01/02/2017   Procedure: ESOPHAGOGASTRODUODENOSCOPY (EGD) WITH PROPOFOL;  Surgeon: [place], Benay Pike, MD;  Location: [facility] ENDOSCOPY;  Service: Gastroenterology;  Laterality: N/A;    SOCIAL HISTORY:   Social History   Tobacco Use  . Smoking status: Current Every Day Smoker    Packs/day: 0.50    Years: 30.00    Pack years: 15.00    Types: Cigarettes  . Smokeless tobacco: Never Used  Substance Use Topics  . Alcohol use: Yes    Comment: Daily    FAMILY HISTORY:   Family History  Problem Relation Age of Onset  . CAD Father   . Lung cancer Sister   . Prostate cancer Neg Hx   . Bladder Cancer Neg Hx   . Kidney cancer Neg Hx     DRUG ALLERGIES:   Allergies  Allergen Reactions  . Multihance [Gadobenate] Hives and Shortness Of Breath  . Hydrocodone Itching and Nausea And Vomiting  . Vicodin [Hydrocodone-Acetaminophen] Nausea Only and Nausea And Vomiting    Vomiting     MEDICATIONS AT HOME:   Prior to Admission medications   Medication Sig Start Date End Date Taking? Authorizing Provider  diphenhydrAMINE (BENADRYL) 25 MG tablet  Take 1 tablet (25 mg total) by mouth every 6 (six) hours as needed. 08/11/14   Carrie Mew, MD  docusate sodium (COLACE) 100 MG capsule Take 1 capsule (100 mg total) by mouth 2 (two) times daily as needed for mild constipation. 01/03/17   Nicholes Mango, MD  gabapentin (NEURONTIN) 800 MG tablet Take 800 mg by mouth 4 (four) times daily.     [provider]  LYRICA 50 MG capsule Take 1 capsule by mouth 3 (three) times daily. 10/01/16   [provider]  nitroGLYCERIN (NITROSTAT) 0.4 MG SL tablet Place 0.4 mg under the tongue every 5 (five) minutes as needed for chest pain.  05/10/13   [provider]  nortriptyline (PAMELOR) 10 MG capsule Take 1-2 capsules by mouth at bedtime. Take 1 capsule (10mg ) by mouth  at every night for 1 week then increase to 2 capsules (20mg ) by mouth afterwards 11/07/16   [provider]  omeprazole (PRILOSEC) 40 MG capsule Take 1 capsule (40 mg total) by mouth 2 (two) times daily at 10 AM and 5 PM. 01/03/17   Gouru, Illene Silver, MD  propranolol (INDERAL) 10 MG tablet Take 1 tablet (10 mg total) by mouth 2 (two) times daily. 01/03/17   Nicholes Mango, MD  tamsulosin (FLOMAX) 0.4 MG CAPS capsule Take 1 capsule (0.4 mg total) by mouth daily. Patient not taking: Reported on 01/02/2017 08/19/16   Ardis Hughs, MD  traMADol (ULTRAM) 50 MG tablet Take 1 tablet by mouth 2 (two) times daily. 12/23/16   [provider]    REVIEW OF SYSTEMS:  Review of Systems  Constitutional: Negative for chills, fever, malaise/fatigue and weight loss.  HENT: Negative for ear pain, hearing loss and tinnitus.   Eyes: Negative for blurred vision, double vision, pain and redness.  Respiratory: Negative for cough, hemoptysis and shortness of breath.   Cardiovascular: Positive for chest pain. Negative for palpitations, orthopnea and leg swelling.  Gastrointestinal: Negative for abdominal pain, constipation, diarrhea, nausea and vomiting.  Genitourinary: Negative for dysuria, frequency and hematuria.  Musculoskeletal: Negative for back pain, joint pain and neck pain.  Skin:       No acne, rash, or lesions  Neurological: Negative for dizziness, tremors, focal weakness and weakness.  Endo/Heme/Allergies: Negative for polydipsia. Does not bruise/bleed easily.  Psychiatric/Behavioral: Negative for depression. The patient is not nervous/anxious and does not have insomnia.      VITAL SIGNS:   Vitals:   03/09/17 1909 03/09/17 1910 03/09/17 1911 03/09/17 2112  BP:    (!) 129/98  Pulse: 67 (!) 59 66   Resp: (!) 26 (!) 28 (!) 28   Temp:    (!) 97.5 F (36.4 C)  TempSrc:    Oral  SpO2: 93% 92% 93%   Weight:    67.9 kg (149 lb 11.1 oz)  Height:    6' (1.829 m)   Wt Readings from Last 3  Encounters:  03/09/17 67.9 kg (149 lb 11.1 oz)  01/03/17 64.4 kg (142 lb 1 oz)  08/19/16 64.9 kg (143 lb)    PHYSICAL EXAMINATION:  Physical Exam  Vitals reviewed. Constitutional: He is oriented to person, place, and time. He appears well-developed and well-nourished. No distress.  HENT:  Head: Normocephalic and atraumatic.  Mouth/Throat: Oropharynx is clear and moist.  Eyes: Conjunctivae and EOM are normal. Pupils are equal, round, and reactive to light. No scleral icterus.  Neck: Normal range of motion. Neck supple. No JVD present. No thyromegaly present.  Cardiovascular: Normal  rate, regular rhythm and intact distal pulses. Exam reveals no gallop and no friction rub.  No murmur heard. Respiratory: Effort normal and breath sounds normal. No respiratory distress. He has no wheezes. He has no rales.  GI: Soft. Bowel sounds are normal. He exhibits no distension. There is no tenderness.  Musculoskeletal: Normal range of motion. He exhibits no edema.  No arthritis, no gout  Lymphadenopathy:    He has no cervical adenopathy.  Neurological: He is alert and oriented to person, place, and time. No cranial nerve deficit.  No dysarthria, no aphasia  Skin: Skin is warm and dry. No rash noted. No erythema.  Psychiatric: He has a normal mood and affect. His behavior is normal. Judgment and thought content normal.    LABORATORY PANEL:   CBC Recent Labs  Lab 03/09/17 1900  WBC 8.5  HGB 14.0  HCT 41.9  PLT 179   ------------------------------------------------------------------------------------------------------------------  Chemistries  Recent Labs  Lab 03/09/17 1900  NA 133*  K 3.4*  CL 98*  CO2 23  GLUCOSE 130*  BUN 9  CREATININE 0.97  CALCIUM 9.1  AST 116*  ALT 108*  ALKPHOS 105  BILITOT 0.8   ------------------------------------------------------------------------------------------------------------------  Cardiac Enzymes Recent Labs  Lab 03/09/17 1900   TROPONINI 0.26*   ------------------------------------------------------------------------------------------------------------------  RADIOLOGY:  No results found.  EKG:   Orders placed or performed during the hospital encounter of 03/09/17  . EKG 12-Lead  . EKG 12-Lead  . EKG 12-Lead immediately post procedure  . EKG 12-Lead  . EKG 12-Lead immediately post procedure    IMPRESSION AND PLAN:  Principal Problem:   ST elevation myocardial infarction (STEMI) ([place]) -2 stents placed by cardiology, defer to their recommendations for further management Active Problems:   COPD (chronic obstructive pulmonary disease) ([place]) -home dose inhalers   Alcohol abuse -monitor patient closely, if he starts to show signs of withdrawal will need CIWA protocol   Cirrhosis ([place]) -avoid hepatotoxins   GERD (gastroesophageal reflux disease) -PPI  All the records are reviewed and case discussed with ED provider. Management plans discussed with the patient and/or family.  DVT PROPHYLAXIS: SubQ heparin  GI PROPHYLAXIS: PPI  ADMISSION STATUS: Inpatient  CODE STATUS: Full    Code Status Orders  (From admission, onward)        Start     Ordered   03/09/17 2104  Full code  Continuous     03/09/17 2103    Code Status History    Date Active Date Inactive Code Status Order ID Comments User Context   01/02/2017 14:10 01/03/2017 13:30 Full Code [id number]  Vaughan Basta, MD Inpatient    Full Code  TOTAL CRITICAL CARE TIME TAKING CARE OF THIS PATIENT: 50 minutes.   Duwan Adrian Swartz 03/09/2017, 9:32 PM  [facility]  Office  [phone]  CC: Primary care physician; Glendon Axe, MD  Note:  This document was prepared using Dragon voice recognition software and may include unintentional dictation errors.

## 2017-03-09 NOTE — ED Notes (Signed)
Pt on zoll

## 2017-03-09 NOTE — Consult Note (Signed)
Name: Phillip Warren MRN: [id number] DOB: 06/22/1955    ADMISSION DATE:  03/09/2017 CONSULTATION DATE: 03/09/2017  REFERRING MD : Dr. Fletcher Anon  CHIEF COMPLAINT: Chest Pain   BRIEF PATIENT DESCRIPTION:  62 yo male admitted with STEMI s/p emergent cath with 99% occlusion of the mid RCA requiring 2 drug eluting stents   SIGNIFICANT EVENTS  02/11-Pt admitted to ICU post cath   STUDIES:  Cardiac Catheterization 02/11>>Severe heavily calcified three-vessel coronary artery disease with the culprit being subtotal occlusion of the right coronary artery with heavy thrombus burden. Mildly elevated left ventricular end-diastolic pressure.  Left ventricular angiography was not performed. Successful angioplasty and drug-eluting stent placement x2 to the mid right coronary artery. Very difficult procedure due to heavy calcifications.  This required a buddy wire and multiple balloon inflations.  HISTORY OF PRESENT ILLNESS:   This is a a 62 yo male with a PMH of Tobacco Abuse, Falls, Neuropathy, MI, Marijuana and Cocaine Abuse, Alcohol Abuse, Hepatitis B and C, CHF, GERD, CAD, COPD, Cirrhosis of Liver, Liver Lesion, Arthritis, and Anxiety.  He presented to [facility] ER 02/11 with c/o substernal chest pain, radiating to the left arm and upper back onset of symptoms 1 week prior to presentation. The pain worsened today and unrelieved with 3 sublingual nitro, therefore he notified EMS.  Upon EMS arrival EKG revealed possible inferior MI, therefore code STEMI initiated.  In the ER EKG showed ST elevations in II, III, and aVF with reciprocal changes, pt transported emergently to cardiac cath lab. Cath results revealed 99% occlusion of the mid RCA requiring 2 drug eluting stents, and 90% occlusions of the proximal to mid LAD that will likely require PCI in the near future.  He was subsequently admitted to the ICU post procedure for further workup and treatment.   PAST MEDICAL HISTORY :   has a past medical history of  Alcohol abuse, Anorexia, Anxiety, Arthritis, Cancer ([place]), CHF (congestive heart failure) ([place]), Cirrhosis of liver ([place]), Cocaine abuse ([place]), COPD (chronic obstructive pulmonary disease) ([place]), Coronary artery disease, GERD (gastroesophageal reflux disease), H/O dizziness, Headache, Hepatitis, Marijuana use, Myocardial infarction ([place]), Neuropathy, Numbness and tingling, Pneumonia, Repeated falls, Shortness of breath dyspnea, Tinnitus, and Tobacco abuse.  has a past surgical history that includes Cardiac surgery; Colonoscopy with propofol (N/A, 08/31/2014); Esophagogastroduodenoscopy (egd) with propofol (N/A, 08/31/2014); Coronary angioplasty; and Esophagogastroduodenoscopy (egd) with propofol (N/A, 01/02/2017). Prior to Admission medications   Medication Sig Start Date End Date Taking? Authorizing Provider  diphenhydrAMINE (BENADRYL) 25 MG tablet Take 1 tablet (25 mg total) by mouth every 6 (six) hours as needed. 08/11/14   Carrie Mew, MD  docusate sodium (COLACE) 100 MG capsule Take 1 capsule (100 mg total) by mouth 2 (two) times daily as needed for mild constipation. 01/03/17   Nicholes Mango, MD  gabapentin (NEURONTIN) 800 MG tablet Take 800 mg by mouth 4 (four) times daily.     [provider]  LYRICA 50 MG capsule Take 1 capsule by mouth 3 (three) times daily. 10/01/16   [provider]  nitroGLYCERIN (NITROSTAT) 0.4 MG SL tablet Place 0.4 mg under the tongue every 5 (five) minutes as needed for chest pain.  05/10/13   [provider]  nortriptyline (PAMELOR) 10 MG capsule Take 1-2 capsules by mouth at bedtime. Take 1 capsule (10mg ) by mouth at every night for 1 week then increase to 2 capsules (20mg ) by mouth afterwards 11/07/16   [provider]  omeprazole (PRILOSEC) 40 MG capsule Take 1  capsule (40 mg total) by mouth 2 (two) times daily at 10 AM and 5 PM. 01/03/17   Gouru, Illene Silver, MD  propranolol (INDERAL) 10 MG tablet Take 1 tablet (10 mg total) by mouth 2 (two)  times daily. 01/03/17   Nicholes Mango, MD  tamsulosin (FLOMAX) 0.4 MG CAPS capsule Take 1 capsule (0.4 mg total) by mouth daily. Patient not taking: Reported on 01/02/2017 08/19/16   Ardis Hughs, MD  traMADol (ULTRAM) 50 MG tablet Take 1 tablet by mouth 2 (two) times daily. 12/23/16   [provider]   Allergies  Allergen Reactions  . Multihance [Gadobenate] Hives and Shortness Of Breath  . Hydrocodone Itching and Nausea And Vomiting  . Vicodin [Hydrocodone-Acetaminophen] Nausea Only and Nausea And Vomiting    Vomiting     FAMILY HISTORY:  family history includes CAD in his father; Lung cancer in his sister. SOCIAL HISTORY:  reports that he has been smoking cigarettes.  He has a 15.00 pack-year smoking history. he has never used smokeless tobacco. He reports that he drinks alcohol. He reports that he uses drugs. Drugs: Marijuana and Cocaine.  REVIEW OF SYSTEMS: Positives in BOLD  Constitutional: Negative for fever, chills, weight loss, malaise/fatigue and diaphoresis.  HENT: Negative for hearing loss, ear pain, nosebleeds, congestion, sore throat, neck pain, tinnitus and ear discharge.   Eyes: Negative for blurred vision, double vision, photophobia, pain, discharge and redness.  Respiratory: cough, hemoptysis, sputum production, shortness of breath, wheezing and stridor.   Cardiovascular: chest pain, palpitations, orthopnea, claudication, leg swelling and PND.  Gastrointestinal: heartburn, nausea, vomiting, abdominal pain, diarrhea, constipation, blood in stool and melena.  Genitourinary: Negative for dysuria, urgency, frequency, hematuria and flank pain.  Musculoskeletal: myalgias, back pain, joint pain and falls.  Skin: Negative for itching and rash.  Neurological: Negative for dizziness, tingling, tremors, sensory change, speech change, focal weakness, seizures, loss of consciousness, weakness and headaches.  Endo/Heme/Allergies: Negative for environmental allergies and  polydipsia. Does not bruise/bleed easily.  SUBJECTIVE:  Pt stating his chest pain has improved.  VITAL SIGNS: Temp:  [96.6 F (35.9 C)] 96.6 F (35.9 C) (02/11 1906) Pulse Rate:  [29-68] 66 (02/11 1911) Resp:  [23-28] 28 (02/11 1911) BP: (91-94)/(60-70) 91/60 (02/11 1907) SpO2:  [92 %-94 %] 93 % (02/11 1911) Weight:  [63.5 kg (140 lb)] 63.5 kg (140 lb) (02/11 1903)  PHYSICAL EXAMINATION: General: well developed male resting in bed, NAD  Neuro: alert and oriented, follows commands  HEENT: supple, no JVD  Cardiovascular: sinus tach, s1s2, no M/R/G  Lungs: mild rhonchi throughout, even, non labored  Abdomen: +BS x4, soft, non tender, non distended  Musculoskeletal: normal bulk and tone, no edema Skin: right radial TR band in place no bleeding or hematoma present   Recent Labs  Lab 03/09/17 1900  NA 133*  K 3.4*  CL 98*  CO2 23  BUN 9  CREATININE 0.97  GLUCOSE 130*   Recent Labs  Lab 03/09/17 1900  HGB 14.0  HCT 41.9  WBC 8.5  PLT 179   No results found.  ASSESSMENT / PLAN: STEMI s/p cardiac catheterization  Unstable Angina  Transaminitis secondary to cirrhosis of liver Hypokalemia  Hx: Current everyday smoker, COPD, CHF, CAD, and GERD  P: Supplemental O2 for dyspnea and/or hypoxia  Prn CXR Trend troponin's  Continuous telemetry monitoring  Post cath EKG Echo pending  Continue cardiac medications per Cardiology recommendations  Continuous NS x10 hrs post cath  Trend BMP Replace electrolytes as indicated  Monitor UOP Trend CBC Monitor s/sx of bleeding and transfuse for hgb <7 SCD's for VTE prophylaxis   Marda Stalker, [place] Pager [phone] (please enter 7 digits) PCCM Consult Pager [phone] (please enter 7 digits)

## 2017-03-09 NOTE — Progress Notes (Addendum)
[facility] Progress Note Patient Name: Phillip Warren DOB: [birth year] MRN: [id number]   Date of Service  03/09/2017  HPI/Events of Note  62 yo male admitted with STEMI. Findings at Cardiac Cath: RCA occlusion. Now s/p RCA stent placement by Cardiology. Management per Cardiology. PCCM has seen the patient in consultation in the ICU. VSS.   eICU Interventions  No new orders.      Intervention Category Evaluation Type: New Patient Evaluation  Lysle Dingwall 03/09/2017, 9:52 PM

## 2017-03-09 NOTE — ED Provider Notes (Signed)
[facility] Emergency Department Provider Note ____________________________________________   None    (approximate)  I have reviewed the triage vital signs and the nursing notes.   HISTORY  Chief Complaint No chief complaint on file.  History of present illness is limited due to acute distress  HPI Phillip Warren is a 62 y.o. male with past medical history as noted below including CAD with prior MI as well as CHF who presents with chest pain for the last week, acutely worsened today, constant, substernal, and nonradiating.  Symptoms not relieved after he took 3 nitroglycerin.  Patient is unable to provide any other information.  Past Medical History:  Diagnosis Date  . Alcohol abuse   . Anorexia    has trouble eating due to poor appetite  . Anxiety    trouble sleeping; lost two sisters and his mother close together  . Arthritis    hands, feet (burning, stinging)  . Cancer [facility])    liver lesion initial staging.  . CHF (congestive heart failure) ([place])   . Cirrhosis of liver ([place])   . Cocaine abuse ([place])   . COPD (chronic obstructive pulmonary disease) ([place])   . Coronary artery disease   . GERD (gastroesophageal reflux disease)   . H/O dizziness   . Headache   . Hepatitis    B and C (chronic)  . Marijuana use   . Myocardial infarction ([place])    massive MI at age 30  . Neuropathy   . Numbness and tingling    legs and feet bilat   . Pneumonia   . Repeated falls   . Shortness of breath dyspnea   . Tinnitus   . Tobacco abuse     Patient Active Problem List   Diagnosis Date Noted  . GI bleed 01/02/2017  . Hepatic cirrhosis ([place])   . Hepatocellular carcinoma ([place])   . [place] (hepatocellular carcinoma) ([place])   . Hepatitis, viral   . Cancer, hepatocellular ([place]) 08/31/2014  . Absolute anemia 08/26/2014  . Cirrhosis ([place]) 08/26/2014  . Arteriosclerosis of coronary artery 08/26/2014  . HBV (hepatitis B virus) infection 08/26/2014  . HCV  (hepatitis C virus) 08/26/2014  . Chronic hepatitis C ([place]) 04/13/2014  . Foot pain 04/13/2014    Past Surgical History:  Procedure Laterality Date  . CARDIAC SURGERY    . COLONOSCOPY WITH PROPOFOL N/A 08/31/2014   Procedure: COLONOSCOPY WITH PROPOFOL;  Surgeon: Josefine Class, MD;  Location: [facility] ENDOSCOPY;  Service: Endoscopy;  Laterality: N/A;  . CORONARY ANGIOPLASTY     at age 82 secondary to massive MI  . ESOPHAGOGASTRODUODENOSCOPY (EGD) WITH PROPOFOL N/A 08/31/2014   Procedure: ESOPHAGOGASTRODUODENOSCOPY (EGD) WITH PROPOFOL;  Surgeon: Josefine Class, MD;  Location: [facility] ENDOSCOPY;  Service: Endoscopy;  Laterality: N/A;  . ESOPHAGOGASTRODUODENOSCOPY (EGD) WITH PROPOFOL N/A 01/02/2017   Procedure: ESOPHAGOGASTRODUODENOSCOPY (EGD) WITH PROPOFOL;  Surgeon: Toledo, Benay Pike, MD;  Location: [facility] ENDOSCOPY;  Service: Gastroenterology;  Laterality: N/A;    Prior to Admission medications   Medication Sig Start Date End Date Taking? Authorizing Provider  diphenhydrAMINE (BENADRYL) 25 MG tablet Take 1 tablet (25 mg total) by mouth every 6 (six) hours as needed. 08/11/14   Carrie Mew, MD  docusate sodium (COLACE) 100 MG capsule Take 1 capsule (100 mg total) by mouth 2 (two) times daily as needed for mild constipation. 01/03/17   Nicholes Mango, MD  gabapentin (NEURONTIN) 800 MG tablet Take 800 mg by mouth 4 (four) times daily.  [provider]  LYRICA 50 MG capsule Take 1 capsule by mouth 3 (three) times daily. 10/01/16   [provider]  nitroGLYCERIN (NITROSTAT) 0.4 MG SL tablet Place 0.4 mg under the tongue every 5 (five) minutes as needed for chest pain.  05/10/13   [provider]  nortriptyline (PAMELOR) 10 MG capsule Take 1-2 capsules by mouth at bedtime. Take 1 capsule (10mg ) by mouth at every night for 1 week then increase to 2 capsules (20mg ) by mouth afterwards 11/07/16   [provider]  omeprazole (PRILOSEC) 40 MG capsule Take 1 capsule  (40 mg total) by mouth 2 (two) times daily at 10 AM and 5 PM. 01/03/17   Gouru, Illene Silver, MD  propranolol (INDERAL) 10 MG tablet Take 1 tablet (10 mg total) by mouth 2 (two) times daily. 01/03/17   Nicholes Mango, MD  tamsulosin (FLOMAX) 0.4 MG CAPS capsule Take 1 capsule (0.4 mg total) by mouth daily. Patient not taking: Reported on 01/02/2017 08/19/16   Ardis Hughs, MD  traMADol (ULTRAM) 50 MG tablet Take 1 tablet by mouth 2 (two) times daily. 12/23/16   [provider]    Allergies Multihance [gadobenate]; Hydrocodone; and Vicodin [hydrocodone-acetaminophen]  Family History  Problem Relation Age of Onset  . CAD Father   . Lung cancer Sister   . Prostate cancer Neg Hx   . Bladder Cancer Neg Hx   . Kidney cancer Neg Hx     Social History Social History   Tobacco Use  . Smoking status: Current Every Day Smoker    Packs/day: 0.50    Years: 30.00    Pack years: 15.00    Types: Cigarettes  . Smokeless tobacco: Never Used  Substance Use Topics  . Alcohol use: Yes    Comment: Daily  . Drug use: Yes    Types: Marijuana, Cocaine    Comment: pt states has not used cocaine in 20 years     Review of Systems Level V caveat: Unable to obtain review of systems due to acute distress    ____________________________________________   PHYSICAL EXAM:  VITAL SIGNS: ED Triage Vitals  Enc Vitals Group     BP 03/09/17 1906 94/70     Pulse Rate 03/09/17 1906 68     Resp 03/09/17 1906 (!) 23     Temp 03/09/17 1906 (!) 96.6 F (35.9 C)     Temp Source 03/09/17 1906 Axillary     SpO2 03/09/17 1906 94 %     Weight 03/09/17 1903 140 lb (63.5 kg)     Height 03/09/17 1903 5\' 10"  (1.778 m)     Head Circumference --      Peak Flow --      Pain Score 03/09/17 1903 10     Pain Loc --      Pain Edu? --      Excl. in Section? --     Constitutional: Alert and oriented.  Uncomfortable appearing, moaning.  Eyes: Conjunctivae are normal.  EOMI. Head: Atraumatic. Nose: No  congestion/rhinnorhea. Mouth/Throat: Mucous membranes are moist.   Neck: Normal range of motion.  Cardiovascular: Normal rate, regular rhythm. Grossly normal heart sounds.  Good peripheral circulation. Respiratory: Slightly increased respiratory effort.  No retractions. Lungs CTAB. Gastrointestinal: Soft and nontender. No distention.  Genitourinary: No CVA tenderness. Musculoskeletal: No lower extremity edema.  Extremities warm and well perfused.  Neurologic:  Normal speech and language. No gross focal neurologic deficits are appreciated.  Skin:  Skin is warm  and dry. No rash noted. Psychiatric: Unable to assess due to acute distress.  ____________________________________________   LABS (all labs ordered are listed, but only abnormal results are displayed)  Labs Reviewed  CBC WITH DIFFERENTIAL/PLATELET  PROTIME-INR  APTT  COMPREHENSIVE METABOLIC PANEL  TROPONIN I  LIPID PANEL   ____________________________________________  EKG  ED ECG REPORT I, Arta Silence, the attending physician, personally viewed and interpreted this ECG.  Date: 03/09/2017 EKG Time: 1902 Rate: 60s Rhythm: normal sinus rhythm QRS Axis: normal Intervals: normal ST/T Wave abnormalities: ST elevations in II, III and aVF with reciprocal changes Narrative Interpretation: Acute STEMI  ____________________________________________  RADIOLOGY    ____________________________________________   PROCEDURES  Procedure(s) performed: No  Procedures  Critical Care performed: Yes  CRITICAL CARE Performed by: Arta Silence   Total critical care time: 35 minutes  Critical care time was exclusive of separately billable procedures and treating other patients.  Critical care was necessary to treat or prevent imminent or life-threatening deterioration.  Critical care was time spent personally by me on the following activities: development of treatment plan with patient and/or surrogate as  well as nursing, discussions with consultants, evaluation of patient's response to treatment, examination of patient, obtaining history from patient or surrogate, ordering and performing treatments and interventions, ordering and review of laboratory studies, ordering and review of radiographic studies, pulse oximetry and re-evaluation of patient's condition. ____________________________________________   INITIAL IMPRESSION / ASSESSMENT AND PLAN / ED COURSE  Pertinent labs & imaging results that were available during my care of the patient were reviewed by me and considered in my medical decision making (see chart for details).  62 year old male with past history of CAD status post MI, CHF, and other PMH as noted above presents with chest pain apparently for the last week but acutely worsened today.  EKG in the field and here are consistent with STEMI.  Patient was made STEMI activation by EMS.  On arrival, the patient is extremely uncomfortable appearing and was difficult to obtain history from due to his acute pain and distress.  Vital signs are stable except for borderline blood pressure and the remainder the exam is as described above.  Dr. Fletcher Anon arrived at the bedside within several minutes and agrees with diagnosis of STEMI and will take the patient emergently to the Cath Lab.      ____________________________________________   FINAL CLINICAL IMPRESSION(S) / ED DIAGNOSES  Final diagnoses:  ST elevation myocardial infarction (STEMI), unspecified artery ([place])      NEW MEDICATIONS STARTED DURING THIS VISIT:  New Prescriptions   No medications on file     Note:  This document was prepared using Dragon voice recognition software and may include unintentional dictation errors.    Arta Silence, MD 03/09/17 [phone]

## 2017-03-09 NOTE — Consult Note (Signed)
Cardiology Consultation:   Patient ID: Phillip Warren; [id number]; 1955/02/02   Admit date: 03/09/2017 Date of Consult: 03/09/2017  Primary Care Provider: Glendon Axe, MD Primary Cardiologist: new Fletcher Anon) Primary Electrophysiologist:     Patient Profile:   Phillip Warren is a 62 y.o. male with a hx of liver cirrhosis with prior GI bleed and previous remote myocardial infarction who is being seen today for the evaluation of chest pain and abnormal EKG at the request of Dr. Cherylann Banas.  History of Present Illness:   Phillip Warren is a 62 year old male with history of prior alcohol abuse, chronic anorexia, anxiety, severe peripheral neuropathy, with previous liver cancer which was treated, liver cirrhosis, previous cocaine use, COPD with active tobacco use, hepatitis C and remote history of myocardial infarction with no previous revascularization. He reports intermittent exertional chest pain over the last week.  However, this evening he started having severe substernal chest tightness that continued and was very severe .  He called EMS and was having 10 out of 10 chest pain which continued until after PCI.  The patient was in significant distress due to pain and could not provide detailed history due to that.  The chest pain was associated with significant shortness of breath and diaphoresis. EKG by EMS showed significant inferior ST elevation with reciprocal anterior changes.  A code STEMI was called.  Past Medical History:  Diagnosis Date  . Alcohol abuse   . Anorexia    has trouble eating due to poor appetite  . Anxiety    trouble sleeping; lost two sisters and his mother close together  . Arthritis    hands, feet (burning, stinging)  . Cancer [facility])    liver lesion initial staging.  . CHF (congestive heart failure) ([place])   . Cirrhosis of liver ([place])   . Cocaine abuse ([place])   . COPD (chronic obstructive pulmonary disease) ([place])   . Coronary artery disease   . GERD  (gastroesophageal reflux disease)   . H/O dizziness   . Headache   . Hepatitis    B and C (chronic)  . Marijuana use   . Myocardial infarction ([place])    massive MI at age 76  . Neuropathy   . Numbness and tingling    legs and feet bilat   . Pneumonia   . Repeated falls   . Shortness of breath dyspnea   . Tinnitus   . Tobacco abuse     Past Surgical History:  Procedure Laterality Date  . CARDIAC SURGERY    . COLONOSCOPY WITH PROPOFOL N/A 08/31/2014   Procedure: COLONOSCOPY WITH PROPOFOL;  Surgeon: Josefine Class, MD;  Location: [facility] ENDOSCOPY;  Service: Endoscopy;  Laterality: N/A;  . CORONARY ANGIOPLASTY     at age 34 secondary to massive MI  . ESOPHAGOGASTRODUODENOSCOPY (EGD) WITH PROPOFOL N/A 08/31/2014   Procedure: ESOPHAGOGASTRODUODENOSCOPY (EGD) WITH PROPOFOL;  Surgeon: Josefine Class, MD;  Location: [facility] ENDOSCOPY;  Service: Endoscopy;  Laterality: N/A;  . ESOPHAGOGASTRODUODENOSCOPY (EGD) WITH PROPOFOL N/A 01/02/2017   Procedure: ESOPHAGOGASTRODUODENOSCOPY (EGD) WITH PROPOFOL;  Surgeon: Toledo, Benay Pike, MD;  Location: [facility] ENDOSCOPY;  Service: Gastroenterology;  Laterality: N/A;     Home Medications:  Prior to Admission medications   Medication Sig Start Date End Date Taking? Authorizing Provider  diphenhydrAMINE (BENADRYL) 25 MG tablet Take 1 tablet (25 mg total) by mouth every 6 (six) hours as needed. 08/11/14   Carrie Mew, MD  docusate sodium (COLACE) 100 MG capsule Take 1 capsule (100  mg total) by mouth 2 (two) times daily as needed for mild constipation. 01/03/17   Nicholes Mango, MD  gabapentin (NEURONTIN) 800 MG tablet Take 800 mg by mouth 4 (four) times daily.     [provider]  LYRICA 50 MG capsule Take 1 capsule by mouth 3 (three) times daily. 10/01/16   [provider]  nitroGLYCERIN (NITROSTAT) 0.4 MG SL tablet Place 0.4 mg under the tongue every 5 (five) minutes as needed for chest pain.  05/10/13   [provider]    nortriptyline (PAMELOR) 10 MG capsule Take 1-2 capsules by mouth at bedtime. Take 1 capsule (10mg ) by mouth at every night for 1 week then increase to 2 capsules (20mg ) by mouth afterwards 11/07/16   [provider]  omeprazole (PRILOSEC) 40 MG capsule Take 1 capsule (40 mg total) by mouth 2 (two) times daily at 10 AM and 5 PM. 01/03/17   Gouru, Illene Silver, MD  propranolol (INDERAL) 10 MG tablet Take 1 tablet (10 mg total) by mouth 2 (two) times daily. 01/03/17   Nicholes Mango, MD  tamsulosin (FLOMAX) 0.4 MG CAPS capsule Take 1 capsule (0.4 mg total) by mouth daily. Patient not taking: Reported on 01/02/2017 08/19/16   Ardis Hughs, MD  traMADol (ULTRAM) 50 MG tablet Take 1 tablet by mouth 2 (two) times daily. 12/23/16   [provider]    Inpatient Medications: Scheduled Meds: . [START ON 03/10/2017] aspirin  81 mg Oral Daily  . [START ON 03/10/2017] atorvastatin  40 mg Oral q1800  . [START ON 03/10/2017] clopidogrel  75 mg Oral Q breakfast  . gabapentin  800 mg Oral QID  . metoprolol tartrate  25 mg Oral BID  . pantoprazole  40 mg Oral Daily  . [START ON 03/10/2017] sodium chloride flush  3 mL Intravenous Q12H  . tamsulosin  0.4 mg Oral Daily  . traMADol  50 mg Oral BID   Continuous Infusions: . sodium chloride 1,000 mL (03/09/17 1906)  . [START ON 03/10/2017] sodium chloride    . sodium chloride     PRN Meds: [START ON 03/10/2017] sodium chloride, docusate sodium, nitroGLYCERIN, ondansetron (ZOFRAN) IV, [START ON 03/10/2017] sodium chloride flush  Allergies:    Allergies  Allergen Reactions  . Multihance [Gadobenate] Hives and Shortness Of Breath  . Hydrocodone Itching and Nausea And Vomiting  . Vicodin [Hydrocodone-Acetaminophen] Nausea Only and Nausea And Vomiting    Vomiting     Social History:   Social History   Socioeconomic History  . Marital status: Widowed    Spouse name: Not on file  . Number of children: Not on file  . Years of education: Not on  file  . Highest education level: Not on file  Social Needs  . Financial resource strain: Not on file  . Food insecurity - worry: Not on file  . Food insecurity - inability: Not on file  . Transportation needs - medical: Not on file  . Transportation needs - non-medical: Not on file  Occupational History  . Not on file  Tobacco Use  . Smoking status: Current Every Day Smoker    Packs/day: 0.50    Years: 30.00    Pack years: 15.00    Types: Cigarettes  . Smokeless tobacco: Never Used  Substance and Sexual Activity  . Alcohol use: Yes    Comment: Daily  . Drug use: Yes    Types: Marijuana, Cocaine    Comment: pt states has not used cocaine in 20  years   . Sexual activity: Not on file  Other Topics Concern  . Not on file  Social History Narrative  . Not on file    Family History:    Family History  Problem Relation Age of Onset  . CAD Father   . Lung cancer Sister   . Prostate cancer Neg Hx   . Bladder Cancer Neg Hx   . Kidney cancer Neg Hx      ROS:  Please see the history of present illness.   All other ROS reviewed and negative.     Physical Exam/Data:   Vitals:   03/09/17 1909 03/09/17 1910 03/09/17 1911 03/09/17 2112  BP:    (!) 129/98  Pulse: 67 (!) 59 66   Resp: (!) 26 (!) 28 (!) 28   Temp:    (!) 97.5 F (36.4 C)  TempSrc:    Oral  SpO2: 93% 92% 93%   Weight:    149 lb 11.1 oz (67.9 kg)  Height:    6' (1.829 m)   No intake or output data in the 24 hours ending 03/09/17 2121 Filed Weights   03/09/17 1903 03/09/17 2112  Weight: 140 lb (63.5 kg) 149 lb 11.1 oz (67.9 kg)   Body mass index is 20.3 kg/m.  General:  Well nourished, well developed, in severe distress due to pain. HEENT: normal Lymph: no adenopathy Neck: no JVD Endocrine:  No thryomegaly Vascular: No carotid bruits; FA pulses 2+ bilaterally without bruits  Cardiac:  normal S1, S2; RRR; no murmur  Lungs:  clear to auscultation bilaterally, no wheezing, rhonchi or rales  Abd: soft,  nontender, no hepatomegaly  Ext: no edema Musculoskeletal:  No deformities, BUE and BLE strength normal and equal Skin: warm and dry  Neuro:  CNs 2-12 intact, no focal abnormalities noted Psych:  Normal affect   EKG:  The EKG was personally reviewed and demonstrates: Normal sinus rhythm with 3-4 mm of inferior ST elevation with reciprocal changes in the anterior leads   Relevant CV Studies: Cardiac catheterization: 1.  Severe heavily calcified three-vessel coronary artery disease with the culprit being subtotal occlusion of the right coronary artery with heavy thrombus burden. 2.  Mildly elevated left ventricular end-diastolic pressure.  Left ventricular angiography was not performed. 3.  Successful angioplasty and drug-eluting stent placement x2 to the mid right coronary artery.  Very difficult procedure due to heavy calcifications.  This required a buddy wire and multiple balloon inflations.  Recommendations:  This is a very difficult situation.  The patient has history of liver cirrhosis and upper GI bleed.  However, his coronary arteries are heavily calcified with slightly suboptimal PCI results due to that.  He initially could not take oral antiplatelet medications until after the procedure was finished.  I elected to start Aggrastat for 6 hours only. The patient will need LAD PCI likely with her atherectomy in the near future.  Laboratory Data:  Chemistry Recent Labs  Lab 03/09/17 1900  NA 133*  K 3.4*  CL 98*  CO2 23  GLUCOSE 130*  BUN 9  CREATININE 0.97  CALCIUM 9.1  GFRNONAA >60  GFRAA >60  ANIONGAP 12    Recent Labs  Lab 03/09/17 1900  PROT 9.0*  ALBUMIN 3.8  AST 116*  ALT 108*  ALKPHOS 105  BILITOT 0.8   Hematology Recent Labs  Lab 03/09/17 1900  WBC 8.5  RBC 4.70  HGB 14.0  HCT 41.9  MCV 89.2  MCH 29.8  MCHC  33.4  RDW 15.1*  PLT 179   Cardiac Enzymes Recent Labs  Lab 03/09/17 1900  TROPONINI 0.26*   No results for input(s): TROPIPOC in  the last 168 hours.  BNPNo results for input(s): BNP, PROBNP in the last 168 hours.  DDimer No results for input(s): DDIMER in the last 168 hours.  Radiology/Studies:  No results found.  Assessment and Plan:   1. Acute inferior ST elevation myocardial infarction: Emergent cardiac catheterization showed subtotal occlusion of the midright coronary artery which was heavily calcified with large thrombus burden.  This was treated successfully with PCI and 2 overlapped drug-eluting stents.  The patient could not swallow antiplatelet medications on presentation and during the case due to severe nausea.  He vomited once.  Due to that and given large thrombus burden, I elected to start him on Aggrastat for short-term only.  We will try this for 4 hours with the understanding that he is high risk for bleeding given liver cirrhosis.  He was loaded with aspirin and clopidogrel.  He does have underlying three-vessel coronary artery disease with suboptimal angiography due to significant tachycardia and presence of calcifications.  He will likely require staged LAD PCI with atherectomy in the near future.  I started the patient on atorvastatin and metoprolol. 2. History of liver cirrhosis with previous GI bleed: Monitor for any signs of bleeding given the use of antiplatelet and antithrombotic therapy. 3. Tobacco use: Smoking cessation was discussed with the patient after the procedure.  He reports that he does not drink alcohol anymore.   For questions or updates, please contact [place] Please consult [email or web address] for contact info under Cardiology/STEMI.   Signed, Kathlyn Sacramento, MD  03/09/2017 9:21 PM

## 2017-03-10 ENCOUNTER — Other Ambulatory Visit: Payer: Self-pay

## 2017-03-10 ENCOUNTER — Inpatient Hospital Stay (HOSPITAL_COMMUNITY)
Admit: 2017-03-10 | Discharge: 2017-03-10 | Disposition: A | Discharge status: Home / Self Care | Payer: Medicare Other | Attending: Cardiovascular Disease | Admitting: Cardiovascular Disease

## 2017-03-10 ENCOUNTER — Encounter: Payer: Self-pay | Admitting: Cardiovascular Disease

## 2017-03-10 DIAGNOSIS — I255 Ischemic cardiomyopathy: Secondary | ICD-10-CM

## 2017-03-10 DIAGNOSIS — I340 Nonrheumatic mitral (valve) insufficiency: Secondary | ICD-10-CM

## 2017-03-10 DIAGNOSIS — Z8719 Personal history of other diseases of the digestive system: Secondary | ICD-10-CM

## 2017-03-10 DIAGNOSIS — K7460 Unspecified cirrhosis of liver: Secondary | ICD-10-CM

## 2017-03-10 DIAGNOSIS — I2111 ST elevation (STEMI) myocardial infarction involving right coronary artery: Secondary | ICD-10-CM

## 2017-03-10 LAB — BASIC METABOLIC PANEL
ANION GAP: 7 (ref 5–15)
BUN: 11 mg/dL (ref 6–20)
CALCIUM: 8.1 mg/dL — AB (ref 8.9–10.3)
CO2: 22 mmol/L (ref 22–32)
CREATININE: 1.22 mg/dL (ref 0.61–1.24)
Chloride: 104 mmol/L (ref 101–111)
GFR calc non Af Amer: >60 mL/min (ref >60)
Glucose, Bld: 148 mg/dL — ABNORMAL HIGH (ref 65–99)
Potassium: 5.2 mmol/L — ABNORMAL HIGH (ref 3.5–5.1)
SODIUM: 133 mmol/L — AB (ref 135–145)

## 2017-03-10 LAB — ECHOCARDIOGRAM COMPLETE
Height: 72 in
Weight: 2395.08 oz

## 2017-03-10 LAB — CBC WITH DIFFERENTIAL/PLATELET
BASOS ABS: 0 10*3/uL (ref 0–0.1)
Basophils Relative: 0 %
Eosinophils Absolute: 0.2 10*3/uL (ref 0–0.7)
Eosinophils Relative: 3 %
HEMATOCRIT: 39.9 % — AB (ref 40.0–52.0)
HEMOGLOBIN: 13.4 g/dL (ref 13.0–18.0)
LYMPHS PCT: 17 %
Lymphs Abs: 1.3 10*3/uL (ref 1.0–3.6)
MCH: 29.8 pg (ref 26.0–34.0)
MCHC: 33.5 g/dL (ref 32.0–36.0)
MCV: 88.8 fL (ref 80.0–100.0)
Monocytes Absolute: 0.5 10*3/uL (ref 0.2–1.0)
Monocytes Relative: 6 %
NEUTROS ABS: 5.8 10*3/uL (ref 1.4–6.5)
NEUTROS PCT: 74 %
PLATELETS: 146 10*3/uL — AB (ref 150–440)
RBC: 4.49 MIL/uL (ref 4.40–5.90)
RDW: 15 % — ABNORMAL HIGH (ref 11.5–14.5)
WBC: 7.9 10*3/uL (ref 3.8–10.6)

## 2017-03-10 LAB — TROPONIN I: Troponin I: 38.89 ng/mL (ref <0.03)

## 2017-03-10 MED ORDER — OXYCODONE-ACETAMINOPHEN 5-325 MG PO TABS
1.0000 | ORAL_TABLET | Freq: Four times a day (QID) | ORAL | Status: DC | PRN
  Administered 2017-03-10 – 2017-03-11 (×8): 1 via ORAL
  Administered 2017-03-12 (×2): 2 via ORAL
  Filled 2017-03-10 (×3): qty 1
  Filled 2017-03-10: qty 2
  Filled 2017-03-10 (×3): qty 1
  Filled 2017-03-10: qty 2
  Filled 2017-03-10 (×2): qty 1

## 2017-03-10 MED ORDER — NICOTINE 21 MG/24HR TD PT24
21.0000 mg | MEDICATED_PATCH | Freq: Every day | TRANSDERMAL | Status: DC
  Administered 2017-03-10 – 2017-03-12 (×3): 21 mg via TRANSDERMAL
  Filled 2017-03-10 (×3): qty 1

## 2017-03-10 MED ORDER — IPRATROPIUM-ALBUTEROL 0.5-2.5 (3) MG/3ML IN SOLN
3.0000 mL | Freq: Four times a day (QID) | RESPIRATORY_TRACT | Status: DC | PRN
  Administered 2017-03-10: 3 mL via RESPIRATORY_TRACT
  Filled 2017-03-10: qty 3

## 2017-03-10 MED ORDER — HEPARIN SODIUM (PORCINE) 5000 UNIT/ML IJ SOLN
5000.0000 [IU] | Freq: Three times a day (TID) | INTRAMUSCULAR | Status: DC
  Administered 2017-03-10 – 2017-03-12 (×6): 5000 [IU] via SUBCUTANEOUS
  Filled 2017-03-10 (×6): qty 1

## 2017-03-10 MED ORDER — METOPROLOL TARTRATE 25 MG PO TABS
12.5000 mg | ORAL_TABLET | Freq: Two times a day (BID) | ORAL | Status: DC
  Administered 2017-03-10: 12.5 mg via ORAL
  Filled 2017-03-10: qty 1

## 2017-03-10 MED ORDER — OXYCODONE-ACETAMINOPHEN 5-325 MG PO TABS: 1.0000 | ORAL_TABLET | ORAL | Status: DC | PRN

## 2017-03-10 MED ORDER — MORPHINE SULFATE (PF) 2 MG/ML IV SOLN
1.0000 mg | INTRAVENOUS | Status: DC | PRN
  Administered 2017-03-11: 1 mg via INTRAVENOUS
  Administered 2017-03-12: 2 mg via INTRAVENOUS
  Filled 2017-03-10 (×2): qty 1

## 2017-03-10 NOTE — Progress Notes (Signed)
[place] at [place] NAME: Phillip Warren    MR#:  [id number]  DATE OF BIRTH:  1955/09/01  SUBJECTIVE:  CHIEF COMPLAINT:  No chief complaint on file.  - Came in after STEMI, had 2 drug-eluting stents in RCA. -Still has some chest pain. Troponin remains elevated.  REVIEW OF SYSTEMS:  Review of Systems  Constitutional: Negative for chills, fever and malaise/fatigue.  HENT: Negative for congestion, ear discharge, hearing loss and nosebleeds.   Eyes: Negative for blurred vision.  Respiratory: Positive for shortness of breath. Negative for cough and wheezing.   Cardiovascular: Positive for chest pain. Negative for palpitations.  Gastrointestinal: Negative for abdominal pain, constipation, diarrhea, nausea and vomiting.  Genitourinary: Negative for dysuria and urgency.  Musculoskeletal: Negative for myalgias.  Neurological: Negative for dizziness, speech change, focal weakness, seizures and headaches.  Psychiatric/Behavioral: Negative for depression.    DRUG ALLERGIES:   Allergies  Allergen Reactions  . Multihance [Gadobenate] Hives and Shortness Of Breath  . Hydrocodone Itching and Nausea And Vomiting  . Vicodin [Hydrocodone-Acetaminophen] Nausea Only and Nausea And Vomiting    Vomiting     VITALS:  Blood pressure (!) 80/60, pulse 92, temperature 98.3 F (36.8 C), temperature source Oral, resp. rate 14, height 6' (1.829 m), weight 67.9 kg (149 lb 11.1 oz), SpO2 (!) 89 %.  PHYSICAL EXAMINATION:  Physical Exam  GENERAL:  62 y.o.-year-old patient lying in the bed with no acute distress.  EYES: Pupils equal, round, reactive to light and accommodation. No scleral icterus. Extraocular muscles intact.  HEENT: Head atraumatic, normocephalic. Oropharynx and nasopharynx clear.  NECK:  Supple, no jugular venous distention. No thyroid enlargement, no tenderness.  LUNGS: Normal breath sounds bilaterally, no wheezing, rales,rhonchi or  crepitation. No use of accessory muscles of respiration.  CARDIOVASCULAR: S1, S2 normal. No murmurs, rubs, or gallops. Tenderness to palpation of midchest ABDOMEN: Soft, nontender, nondistended. Bowel sounds present. No organomegaly or mass.  EXTREMITIES: No pedal edema, cyanosis, or clubbing.  NEUROLOGIC: Cranial nerves II through XII are intact. Muscle strength 5/5 in all extremities. Sensation intact. Gait not checked.  PSYCHIATRIC: The patient is alert and oriented x 3.  SKIN: No obvious rash, lesion, or ulcer.    LABORATORY PANEL:   CBC Recent Labs  Lab 03/10/17 0333  WBC 7.9  HGB 13.4  HCT 39.9*  PLT 146*   ------------------------------------------------------------------------------------------------------------------  Chemistries  Recent Labs  Lab 03/09/17 1900 03/10/17 0333  NA 133* 133*  K 3.4* 5.2*  CL 98* 104  CO2 23 22  GLUCOSE 130* 148*  BUN 9 11  CREATININE 0.97 1.22  CALCIUM 9.1 8.1*  AST 116*  --   ALT 108*  --   ALKPHOS 105  --   BILITOT 0.8  --    ------------------------------------------------------------------------------------------------------------------  Cardiac Enzymes Recent Labs  Lab 03/10/17 0658  TROPONINI >65.00*   ------------------------------------------------------------------------------------------------------------------  RADIOLOGY:  No results found.  EKG:   Orders placed or performed during the hospital encounter of 03/09/17  . EKG 12-Lead  . EKG 12-Lead  . EKG 12-Lead immediately post procedure  . EKG 12-Lead  . EKG 12-Lead immediately post procedure  . EKG 12-Lead    ASSESSMENT AND PLAN:   62 year old male with past medical history significant for alcohol use, liver cirrhosis, ongoing smoking, COPD, polysubstance abuse, GERD, history of variceal bleed neuropathy presents to the hospital secondary to chest pain and noted to have STEMI.   1. Inferior STEMI-appreciate cardiology consult. Patient went  to  catheter lab and had PCI to mid RCA. He also has residual LAD disease which can be treated as outpatient with possible atherectomy at [facility] - Continue aspirin and Plavix. Patient had his for GI bleed with a known history of varices. -Low-dose beta blocker and statin. Echocardiogram. -Also has some pericardial pain. -Cardiac rehabilitation after discharge.  2. Smoking-strongly counseled against smoking. Nicotine patch has been ordered in the hospital  3. Liver cirrhosis-known history of hepatitis C and also alcohol abuse. Quit drinking about a year ago. -Also has history of variceal bleed. Monitor while on aspirin and Plavix. -Stable INR and platelets at this time.  4. Peripheral neuropathy-continue gabapentin  5. GERD-Protonix  6. DVT prophylaxis-on subcutaneous heparin  Patient will be in the ICU today and transferred to telemetry tomorrow    All the records are reviewed and case discussed with Care Management/Social Workerr. Management plans discussed with the patient, family and they are in agreement.  CODE STATUS: Full Code  TOTAL TIME TAKING CARE OF THIS PATIENT: 39 minutes.   POSSIBLE D/C IN 2 DAYS, DEPENDING ON CLINICAL CONDITION.   Gladstone Lighter M.D on 03/10/2017 at 2:04 PM  Between 7am to 6pm - Pager - [phone]  After 6pm go to [email or web address] - password [email or web address] [facility]  Office  [phone]  CC: Primary care physician; Glendon Axe, MD

## 2017-03-10 NOTE — Progress Notes (Signed)
Second troponin level of 38.89 resulted.  NP aware.

## 2017-03-10 NOTE — Progress Notes (Signed)
   03/10/17 1100  Clinical Encounter Type  Visited With Patient  Visit Type Follow-up (advanced directive order request)  Referral From Nurse  Consult/Referral To Chaplain   Chaplain responded to order for advanced directive for patient.  Chaplain engaged patient in information about advanced directives; patient indicated that he was not interested in the documentation.  Chaplain reviewed other services/supports available from chaplain.  Patient appreciative of visit, but as he had visitors arriving, he reported no need of services at this time.  Chaplain encouraged patient to have staff page chaplain if needs changed.

## 2017-03-10 NOTE — Progress Notes (Signed)
*  PRELIMINARY RESULTS* Echocardiogram 2D Echocardiogram has been performed.  Phillip Warren 03/10/2017, 9:42 AM

## 2017-03-10 NOTE — Progress Notes (Signed)
Platelet count at 146, aggrastat stopped at 0223.  NP aware.

## 2017-03-10 NOTE — Progress Notes (Signed)
Progress Note  Patient Name: Phillip Warren Date of Encounter: 03/10/2017  Primary Cardiologist: new to [facility] - consult by Arida  Subjective   Continues to note chest pain, only with deep inspiration. Pain is not positional. Seems to be tolerating DAPT at this time. BP has been soft overnight and into early this morning (possibly in the setting of incorrect BP cuff size as this was changed while cardiology was in the room by RN with improvement in BP to 99 systolic). Troponin peaked at > 65. Potassium mildly elevated this morning at 5.2. Renal function with a slight bump from 0.97-->1.22. HGB stable.   Inpatient Medications    Scheduled Meds: . aspirin  81 mg Oral Daily  . atorvastatin  40 mg Oral q1800  . clopidogrel  75 mg Oral Q breakfast  . gabapentin  800 mg Oral QID  . metoprolol tartrate  25 mg Oral BID  . pantoprazole  40 mg Oral Daily  . sodium chloride flush  3 mL Intravenous Q12H  . tamsulosin  0.4 mg Oral Daily   Continuous Infusions: . sodium chloride Stopped (03/09/17 2155)  . sodium chloride     PRN Meds: sodium chloride, docusate sodium, ipratropium-albuterol, morphine injection, nitroGLYCERIN, ondansetron (ZOFRAN) IV, oxyCODONE-acetaminophen, sodium chloride flush   Vital Signs    Vitals:   03/10/17 0500 03/10/17 0600 03/10/17 0700 03/10/17 0800  BP: (!) 81/65 (!) 78/63 (!) 84/70 90/61  Pulse: (!) 103 98  (!) 111  Resp: 18 15  20   Temp:    97.9 F (36.6 C)  TempSrc:    Axillary  SpO2: 98% 90%  91%  Weight:      Height:        Intake/Output Summary (Last 24 hours) at 03/10/2017 0954 Last data filed at 03/10/2017 0945 Gross per 24 hour  Intake 970.97 ml  Output 1800 ml  Net -829.03 ml   Filed Weights   03/09/17 1903 03/09/17 2112  Weight: 140 lb (63.5 kg) 149 lb 11.1 oz (67.9 kg)    Telemetry    Sinus tachycardia, low 100s bpm - Personally Reviewed  ECG    n/a - Personally Reviewed  Physical Exam   GEN: No acute distress; Frail  appearing.   Neck: No JVD. Cardiac: Mildly tachycardic, no murmurs, rubs, or gallops. Right radial cardiac cath site without bleeding, bruising, swelling, erythema, or warmth. Radial pulse 2+.  Respiratory: Clear to auscultation bilaterally.  GI: Soft, nontender, non-distended.   MS: No edema; No deformity. Neuro:  Alert and oriented x 3; Nonfocal.  Psych: Normal affect.  Labs    Chemistry Recent Labs  Lab 03/09/17 1900 03/10/17 0333  NA 133* 133*  K 3.4* 5.2*  CL 98* 104  CO2 23 22  GLUCOSE 130* 148*  BUN 9 11  CREATININE 0.97 1.22  CALCIUM 9.1 8.1*  PROT 9.0*  --   ALBUMIN 3.8  --   AST 116*  --   ALT 108*  --   ALKPHOS 105  --   BILITOT 0.8  --   GFRNONAA >60 >60  GFRAA >60 >60  ANIONGAP 12 7     Hematology Recent Labs  Lab 03/09/17 1900 03/10/17 0333  WBC 8.5 7.9  RBC 4.70 4.49  HGB 14.0 13.4  HCT 41.9 39.9*  MCV 89.2 88.8  MCH 29.8 29.8  MCHC 33.4 33.5  RDW 15.1* 15.0*  PLT 179 146*    Cardiac Enzymes Recent Labs  Lab 03/09/17 1900 03/10/17 0206 03/10/17  0658  TROPONINI 0.26* 38.89* >65.00*   No results for input(s): TROPIPOC in the last 168 hours.   BNPNo results for input(s): BNP, PROBNP in the last 168 hours.   DDimer No results for input(s): DDIMER in the last 168 hours.   Radiology    No results found.  Cardiac Studies   LHC 03/09/2017: Coronary Findings   Diagnostic  Dominance: Right  Left Main  There is mild diffuse disease throughout the vessel.  Left Anterior Descending  Prox LAD to Mid LAD lesion 90% stenosed  Prox LAD to Mid LAD lesion is 90% stenosed. The lesion is type C. The lesion is severely calcified.  First Diagonal Branch  There is moderate disease in the vessel.  Second Diagonal Branch  There is mild disease in the vessel.  Left Circumflex  Ost Cx to Prox Cx lesion 80% stenosed  Ost Cx to Prox Cx lesion is 80% stenosed.  Right Coronary Artery  Prox RCA lesion 30% stenosed  Prox RCA lesion is 30% stenosed.   Mid RCA-1 lesion 70% stenosed  Mid RCA-1 lesion is 70% stenosed. The lesion is type C.  Mid RCA-2 lesion 99% stenosed  Mid RCA-2 lesion is 99% stenosed. Vessel is the culprit lesion. The lesion is type C and heavily thrombotic. The lesion is severely calcified.  Right Posterior Descending Artery  RPDA lesion 70% stenosed  RPDA lesion is 70% stenosed.  Right Posterior Atrioventricular Branch  Post Atrio lesion 40% stenosed  Post Atrio lesion is 40% stenosed.  Intervention   Mid RCA-1 lesion  Stent  Lesion length: 17 mm. Lesion crossed with guidewire. Pre-stent angioplasty was performed using a BALLOON TREK RX 3.0X20. Maximum pressure: 12 atm. Inflation time: 30 sec. A drug-eluting stent was successfully placed using a STENT SIERRA 3.50 X 18 MM. Maximum pressure: 12 atm. Inflation time: 20 sec. Stent overlaps previously placed stent. Post-stent angioplasty was performed using a BALLOON [place] TREK RX 3.5X8. Maximum pressure: 20 atm. Inflation time: 10 sec. After deploying the initial stent, the area proximal to this appeared to be hazy and ulcerated. I had significant difficulty advancing the noncompliant balloon through this area and thus I suspected that it was significant. Thus, I decided to treat this area. There was significant difficulty advancing equipment due to heavy calcifications. This required using a second wire as a buddy wire. I was able to post dilate both stents with a short 3.5 x 8 noncompliant balloon.  Post-Intervention Lesion Assessment  The intervention was successful. Pre-interventional TIMI flow is 2. Post-intervention TIMI flow is 3. Treated lesion length: 18 mm. No complications occurred at this lesion.  There is a 10% residual stenosis post intervention.  Mid RCA-2 lesion  Stent  Lesion length: 22 mm. Lesion crossed with guidewire using a WIRE RUNTHROUGH .[id number]. Pre-stent angioplasty was performed using a BALLOON TREK RX 2.5X15. Maximum pressure: 12 atm. Inflation time:  30 sec. A drug-eluting stent was successfully placed using a STENT SIERRA 3.25 X 23 MM. Maximum pressure: 12 atm. Inflation time: 20 sec. Post-stent angioplasty was performed using a BALLOON [place] TREK RX 3.5X8. Maximum pressure: 10 atm. Inflation time: 10 sec.  Post-Intervention Lesion Assessment  The intervention was successful. Pre-interventional TIMI flow is 2. Post-intervention TIMI flow is 3. Treated lesion length: 22 mm. No complications occurred at this lesion.  There is a 5% residual stenosis post intervention.  Coronary Diagrams   Diagnostic Diagram       Post-Intervention Diagram  Conclusion     Prox RCA lesion is 30% stenosed.  Post Atrio lesion is 40% stenosed.  RPDA lesion is 70% stenosed.  Mid RCA-1 lesion is 70% stenosed.  Mid RCA-2 lesion is 99% stenosed.  A drug-eluting stent was successfully placed using a STENT SIERRA 3.25 X 23 MM.  Post intervention, there is a 5% residual stenosis.  A drug-eluting stent was successfully placed using a STENT SIERRA 3.50 X 18 MM.  Post intervention, there is a 10% residual stenosis.  Prox LAD to Mid LAD lesion is 90% stenosed.  Ost Cx to Prox Cx lesion is 80% stenosed.   1.  Severe heavily calcified three-vessel coronary artery disease with the culprit being subtotal occlusion of the right coronary artery with heavy thrombus burden. 2.  Mildly elevated left ventricular end-diastolic pressure.  Left ventricular angiography was not performed. 3.  Successful angioplasty and drug-eluting stent placement x2 to the mid right coronary artery.  Very difficult procedure due to heavy calcifications.  This required a buddy wire and multiple balloon inflations.  Recommendations:  This is a very difficult situation.  The patient has history of liver cirrhosis and upper GI bleed.  However, his coronary arteries are heavily calcified with slightly suboptimal PCI results due to that.  He initially could not take oral  antiplatelet medications until after the procedure was finished.  I elected to start Aggrastat for 6 hours only. The patient will need LAD PCI likely with her atherectomy in the near future.     TTE pending  Patient Profile     62 y.o. male with history of liver cancer s/p resection, cirrhosis with prior GI bleed, polysubstance abuse with alcohol and prior cocaine abuse, COPD secondary to tobacco abuse, hepatitis C, and CAD with previous remote myocardial infarction who was admitted to [facility] on 03/09/17 with an inferior STEMI s/p PCI as detailed above.   Assessment & Plan    1. Inferior STEMI: -Status post successful PCI/DES with 2 overlapping DES to the RCA as detailed above. He does have residual disease as detailed above and will likely require intervention on the LAD in the near future with atherectomy at [facility] -Currently notes chest pain with deep inspiration only -DAPT with ASA and Plavix (high bleeding risk given his comorbid conditions) -Lipitor, trend LFT as below -Lopressor -Aggressive risk factor modification -Echo pending to evaluate for possible effusion as well as EF -Cardiac rehab   2. History of liver cancer, cirrhosis, hepatitis C, and prior GI bleed: -Stable -Check HFP -Monitor closely on DAPT -Per IM  3. Tobacco abuse: -Cessation advised  4. HLD: -Lipitor as above  5. Hyperkalemia: -Mildly elevated at 5.2 -Trend  6. AKI: -[place]  Dispo: -Will need to remain in the ICU for today, possible transfer to 2A on 03/11/17  For questions or updates, please contact [place] Please consult [email or web address] for contact info under Cardiology/STEMI.    Signed, Christell Faith, PA-C [place] Pager: [phone] 03/10/2017, 9:54 AM

## 2017-03-11 DIAGNOSIS — F1010 Alcohol abuse, uncomplicated: Secondary | ICD-10-CM

## 2017-03-11 DIAGNOSIS — J449 Chronic obstructive pulmonary disease, unspecified: Secondary | ICD-10-CM

## 2017-03-11 LAB — BASIC METABOLIC PANEL
Anion gap: 7 (ref 5–15)
BUN: 9 mg/dL (ref 6–20)
CO2: 24 mmol/L (ref 22–32)
Calcium: 8.1 mg/dL — ABNORMAL LOW (ref 8.9–10.3)
Chloride: 102 mmol/L (ref 101–111)
Creatinine, Ser: 0.86 mg/dL (ref 0.61–1.24)
GFR calc Af Amer: >60 mL/min (ref >60)
GFR calc non Af Amer: >60 mL/min (ref >60)
Glucose, Bld: 92 mg/dL (ref 65–99)
Potassium: 3.6 mmol/L (ref 3.5–5.1)
Sodium: 133 mmol/L — ABNORMAL LOW (ref 135–145)

## 2017-03-11 LAB — CBC
HCT: 34.5 % — ABNORMAL LOW (ref 40.0–52.0)
Hemoglobin: 11.4 g/dL — ABNORMAL LOW (ref 13.0–18.0)
MCH: 29.3 pg (ref 26.0–34.0)
MCHC: 32.9 g/dL (ref 32.0–36.0)
MCV: 89 fL (ref 80.0–100.0)
Platelets: 100 10*3/uL — ABNORMAL LOW (ref 150–440)
RBC: 3.87 MIL/uL — ABNORMAL LOW (ref 4.40–5.90)
RDW: 14.9 % — ABNORMAL HIGH (ref 11.5–14.5)
WBC: 3.8 10*3/uL (ref 3.8–10.6)

## 2017-03-11 LAB — TROPONIN I: Troponin I: 28.03 ng/mL (ref <0.03)

## 2017-03-11 MED ORDER — SODIUM CHLORIDE 0.9 % IV BOLUS (SEPSIS)
500.0000 mL | Freq: Once | INTRAVENOUS | Status: AC
  Administered 2017-03-11: 500 mL via INTRAVENOUS

## 2017-03-11 NOTE — Progress Notes (Signed)
[place] at [place] NAME: Phillip Warren    MR#:  [id number]  DATE OF BIRTH:  09-11-1955  SUBJECTIVE:  CHIEF COMPLAINT:  No chief complaint on file.  - Came in after STEMI, had 2 drug-eluting stents in RCA. -Still complains of chest pain, blood pressure low this morning  REVIEW OF SYSTEMS:  Review of Systems  Constitutional: Negative for chills, fever and malaise/fatigue.  HENT: Negative for congestion, ear discharge, hearing loss and nosebleeds.   Eyes: Negative for blurred vision.  Respiratory: Negative for cough, shortness of breath and wheezing.   Cardiovascular: Positive for chest pain. Negative for palpitations.  Gastrointestinal: Negative for abdominal pain, constipation, diarrhea, nausea and vomiting.  Genitourinary: Negative for dysuria and urgency.  Musculoskeletal: Negative for myalgias.  Neurological: Negative for dizziness, speech change, focal weakness, seizures and headaches.  Psychiatric/Behavioral: Negative for depression.    DRUG ALLERGIES:   Allergies  Allergen Reactions  . Multihance [Gadobenate] Hives and Shortness Of Breath  . Hydrocodone Itching and Nausea And Vomiting  . Vicodin [Hydrocodone-Acetaminophen] Nausea Only and Nausea And Vomiting    Vomiting     VITALS:  Blood pressure (!) 94/59, pulse 89, temperature 97.8 F (36.6 C), temperature source Oral, resp. rate 14, height 6' (1.829 m), weight 67.9 kg (149 lb 11.1 oz), SpO2 96 %.  PHYSICAL EXAMINATION:  Physical Exam  GENERAL:  62 y.o.-year-old patient lying in the bed with no acute distress.  EYES: Pupils equal, round, reactive to light and accommodation. No scleral icterus. Extraocular muscles intact.  HEENT: Head atraumatic, normocephalic. Oropharynx and nasopharynx clear.  NECK:  Supple, no jugular venous distention. No thyroid enlargement, no tenderness.  LUNGS: Normal breath sounds bilaterally, no wheezing, rales,rhonchi or crepitation. No  use of accessory muscles of respiration.  CARDIOVASCULAR: S1, S2 normal. No murmurs, rubs, or gallops. Tenderness to palpation of midchest ABDOMEN: Soft, nontender, nondistended. Bowel sounds present. No organomegaly or mass.  EXTREMITIES: No pedal edema, cyanosis, or clubbing.  NEUROLOGIC: Cranial nerves II through XII are intact. Muscle strength 5/5 in all extremities. Sensation intact. Gait not checked.  PSYCHIATRIC: The patient is alert and oriented x 3.  SKIN: No obvious rash, lesion, or ulcer.    LABORATORY PANEL:   CBC Recent Labs  Lab 03/11/17 0401  WBC 3.8  HGB 11.4*  HCT 34.5*  PLT 100*   ------------------------------------------------------------------------------------------------------------------  Chemistries  Recent Labs  Lab 03/09/17 1900  03/11/17 0401  NA 133*   < > 133*  K 3.4*   < > 3.6  CL 98*   < > 102  CO2 23   < > 24  GLUCOSE 130*   < > 92  BUN 9   < > 9  CREATININE 0.97   < > 0.86  CALCIUM 9.1   < > 8.1*  AST 116*  --   --   ALT 108*  --   --   ALKPHOS 105  --   --   BILITOT 0.8  --   --    < > = values in this interval not displayed.   ------------------------------------------------------------------------------------------------------------------  Cardiac Enzymes Recent Labs  Lab 03/11/17 0401  TROPONINI 28.03*   ------------------------------------------------------------------------------------------------------------------  RADIOLOGY:  No results found.  EKG:   Orders placed or performed during the hospital encounter of 03/09/17  . EKG 12-Lead  . EKG 12-Lead  . EKG 12-Lead immediately post procedure  . EKG 12-Lead  . EKG 12-Lead immediately post procedure  .  EKG 12-Lead    ASSESSMENT AND PLAN:   62 year old male with past medical history significant for alcohol use, liver cirrhosis, ongoing smoking, COPD, polysubstance abuse, GERD, history of variceal bleed neuropathy presents to the hospital secondary to chest pain  and noted to have STEMI.   1. Inferior STEMI-appreciate cardiology consult. Patient went to catheter lab and had PCI to mid RCA. He also has residual LAD disease which can be treated as outpatient with possible atherectomy at [facility] - Continue aspirin and Plavix. Patient had his for GI bleed with a known history of varices. -Low-dose beta blocker and statin. Echocardiogram. -Blood pressure is soft.  Hold medications if needed -Has acute systolic heart failure secondary to underlying CAD.  EF of 45%. -Also has some pericardial pain. -Cardiac rehabilitation after discharge.  2.  Atypical chest pain-no pericardial effusion on echocardiogram, and less likely to be Dressler syndrome -Tender to touch and worsens with movement. -Cannot try colchicine due to his cirrhosis.  Continue as needed pain medication. -If does not improve, LAD intervention at home according to cardiology.  3. Smoking-strongly counseled against smoking. Nicotine patch has been ordered in the hospital  4. Liver cirrhosis-known history of hepatitis C and also alcohol abuse. Quit drinking about a year ago. -Also has history of variceal bleed. Monitor while on aspirin and Plavix. -Stable INR and platelets at this time.  5. Peripheral neuropathy-continue gabapentin  6. DVT prophylaxis-on subcutaneous heparin  Possible transfer to telemetry floor today if blood pressure improves.    All the records are reviewed and case discussed with Care Management/Social Workerr. Management plans discussed with the patient, family and they are in agreement.  CODE STATUS: Full Code  TOTAL TIME TAKING CARE OF THIS PATIENT: 39 minutes.   POSSIBLE D/C IN 1-2 DAYS, DEPENDING ON CLINICAL CONDITION.   Gladstone Lighter M.D on 03/11/2017 at 8:51 AM  Between 7am to 6pm - Pager - [phone]  After 6pm go to [email or web address] - password [email or web address] [facility]  Office  [phone]  CC: Primary care physician; Glendon Axe, MD

## 2017-03-11 NOTE — Progress Notes (Signed)
Progress Note  Patient Name: Phillip Warren Date of Encounter: 03/11/2017  Primary Cardiologist: new to [facility] - consult by Arida  Subjective   Continues to note atypical chest pain that is worse with cough or deep inspiration. He also notes the chest pain when he pulls himself up. BP continues to run soft in the 60Y systolic. Potassium improved. Renal function stable. HGB 13.4-->11.4, PLT 146-->100, WBC 7.9-->3.8. Was receiving IV fluids on 2/12. Troponin pending. Tolerating DAPT. Got one dose of Lopressor 12.5 mg at 4 PM on 2/12.   Inpatient Medications    Scheduled Meds: . aspirin  81 mg Oral Daily  . atorvastatin  40 mg Oral q1800  . clopidogrel  75 mg Oral Q breakfast  . gabapentin  800 mg Oral QID  . heparin injection (subcutaneous)  5,000 Units Subcutaneous Q8H  . metoprolol tartrate  12.5 mg Oral BID  . nicotine  21 mg Transdermal Daily  . pantoprazole  40 mg Oral Daily  . sodium chloride flush  3 mL Intravenous Q12H  . tamsulosin  0.4 mg Oral Daily   Continuous Infusions: . sodium chloride Stopped (03/09/17 2155)  . sodium chloride     PRN Meds: sodium chloride, docusate sodium, ipratropium-albuterol, morphine injection, nitroGLYCERIN, ondansetron (ZOFRAN) IV, oxyCODONE-acetaminophen, sodium chloride flush   Vital Signs    Vitals:   03/11/17 0500 03/11/17 0540 03/11/17 0600 03/11/17 0700  BP: (!) 89/61  (!) 85/71 (!) 82/63  Pulse: 85 95 96 88  Resp: 15 14 15 12   Temp:      TempSrc:      SpO2: 93% 95% 96% 95%  Weight:      Height:        Intake/Output Summary (Last 24 hours) at 03/11/2017 0721 Last data filed at 03/11/2017 0400 Gross per 24 hour  Intake 714 ml  Output 2350 ml  Net -1636 ml   Filed Weights   03/09/17 1903 03/09/17 2112  Weight: 140 lb (63.5 kg) 149 lb 11.1 oz (67.9 kg)    Telemetry    NSR - Personally Reviewed  ECG    n/a - Personally Reviewed  Physical Exam   GEN: Frail appearing; No acute distress.   Neck: No  JVD. Cardiac: RRR, II/VI systolic murmur at the apex, no rubs, or gallops. Right radial cardiac cath site without bleeding, bruising, swelling, erythema, or warmth. Radial pulse 2+.  Respiratory: Diminihed breath sounds bilaterally.  GI: Soft, nontender, non-distended.   MS: No edema; No deformity. Neuro:  Alert and oriented x 3; Nonfocal.  Psych: Normal affect.  Labs    Chemistry Recent Labs  Lab 03/09/17 1900 03/10/17 0333 03/11/17 0401  NA 133* 133* 133*  K 3.4* 5.2* 3.6  CL 98* 104 102  CO2 23 22 24   GLUCOSE 130* 148* 92  BUN 9 11 9   CREATININE 0.97 1.22 0.86  CALCIUM 9.1 8.1* 8.1*  PROT 9.0*  --   --   ALBUMIN 3.8  --   --   AST 116*  --   --   ALT 108*  --   --   ALKPHOS 105  --   --   BILITOT 0.8  --   --   GFRNONAA >60 >60 >60  GFRAA >60 >60 >60  ANIONGAP 12 7 7      Hematology Recent Labs  Lab 03/09/17 1900 03/10/17 0333 03/11/17 0401  WBC 8.5 7.9 3.8  RBC 4.70 4.49 3.87*  HGB 14.0 13.4 11.4*  HCT 41.9  39.9* 34.5*  MCV 89.2 88.8 89.0  MCH 29.8 29.8 29.3  MCHC 33.4 33.5 32.9  RDW 15.1* 15.0* 14.9*  PLT 179 146* 100*    Cardiac Enzymes Recent Labs  Lab 03/09/17 1900 03/10/17 0206 03/10/17 0658  TROPONINI 0.26* 38.89* >65.00*   No results for input(s): TROPIPOC in the last 168 hours.   BNPNo results for input(s): BNP, PROBNP in the last 168 hours.   DDimer No results for input(s): DDIMER in the last 168 hours.   Radiology    No results found.  Cardiac Studies   LHC 03/09/2017: Coronary Findings   Diagnostic  Dominance: Right  Left Main  There is mild diffuse disease throughout the vessel.  Left Anterior Descending  Prox LAD to Mid LAD lesion 90% stenosed  Prox LAD to Mid LAD lesion is 90% stenosed. The lesion is type C. The lesion is severely calcified.  First Diagonal Branch  There is moderate disease in the vessel.  Second Diagonal Branch  There is mild disease in the vessel.  Left Circumflex  Ost Cx to Prox Cx lesion 80%  stenosed  Ost Cx to Prox Cx lesion is 80% stenosed.  Right Coronary Artery  Prox RCA lesion 30% stenosed  Prox RCA lesion is 30% stenosed.  Mid RCA-1 lesion 70% stenosed  Mid RCA-1 lesion is 70% stenosed. The lesion is type C.  Mid RCA-2 lesion 99% stenosed  Mid RCA-2 lesion is 99% stenosed. Vessel is the culprit lesion. The lesion is type C and heavily thrombotic. The lesion is severely calcified.  Right Posterior Descending Artery  RPDA lesion 70% stenosed  RPDA lesion is 70% stenosed.  Right Posterior Atrioventricular Branch  Post Atrio lesion 40% stenosed  Post Atrio lesion is 40% stenosed.  Intervention   Mid RCA-1 lesion  Stent  Lesion length: 17 mm. Lesion crossed with guidewire. Pre-stent angioplasty was performed using a BALLOON TREK RX 3.0X20. Maximum pressure: 12 atm. Inflation time: 30 sec. A drug-eluting stent was successfully placed using a STENT SIERRA 3.50 X 18 MM. Maximum pressure: 12 atm. Inflation time: 20 sec. Stent overlaps previously placed stent. Post-stent angioplasty was performed using a BALLOON [place] TREK RX 3.5X8. Maximum pressure: 20 atm. Inflation time: 10 sec. After deploying the initial stent, the area proximal to this appeared to be hazy and ulcerated. I had significant difficulty advancing the noncompliant balloon through this area and thus I suspected that it was significant. Thus, I decided to treat this area. There was significant difficulty advancing equipment due to heavy calcifications. This required using a second wire as a buddy wire. I was able to post dilate both stents with a short 3.5 x 8 noncompliant balloon.  Post-Intervention Lesion Assessment  The intervention was successful. Pre-interventional TIMI flow is 2. Post-intervention TIMI flow is 3. Treated lesion length: 18 mm. No complications occurred at this lesion.  There is a 10% residual stenosis post intervention.  Mid RCA-2 lesion  Stent  Lesion length: 22 mm. Lesion crossed with guidewire  using a WIRE RUNTHROUGH .[id number]. Pre-stent angioplasty was performed using a BALLOON TREK RX 2.5X15. Maximum pressure: 12 atm. Inflation time: 30 sec. A drug-eluting stent was successfully placed using a STENT SIERRA 3.25 X 23 MM. Maximum pressure: 12 atm. Inflation time: 20 sec. Post-stent angioplasty was performed using a BALLOON [place] TREK RX 3.5X8. Maximum pressure: 10 atm. Inflation time: 10 sec.  Post-Intervention Lesion Assessment  The intervention was successful. Pre-interventional TIMI flow is 2. Post-intervention TIMI flow is 3. Treated  lesion length: 22 mm. No complications occurred at this lesion.  There is a 5% residual stenosis post intervention.  Coronary Diagrams   Diagnostic Diagram       Post-Intervention Diagram        Conclusion     Prox RCA lesion is 30% stenosed.  Post Atrio lesion is 40% stenosed.  RPDA lesion is 70% stenosed.  Mid RCA-1 lesion is 70% stenosed.  Mid RCA-2 lesion is 99% stenosed.  A drug-eluting stent was successfully placed using a STENT SIERRA 3.25 X 23 MM.  Post intervention, there is a 5% residual stenosis.  A drug-eluting stent was successfully placed using a STENT SIERRA 3.50 X 18 MM.  Post intervention, there is a 10% residual stenosis.  Prox LAD to Mid LAD lesion is 90% stenosed.  Ost Cx to Prox Cx lesion is 80% stenosed.  1. Severe heavily calcified three-vessel coronary artery disease with the culprit being subtotal occlusion of the right coronary artery with heavy thrombus burden. 2. Mildly elevated left ventricular end-diastolic pressure. Left ventricular angiography was not performed. 3. Successful angioplasty and drug-eluting stent placement x2 to the mid right coronary artery. Very difficult procedure due to heavy calcifications. This required a buddy wire and multiple balloon inflations.  Recommendations:  This is a very difficult situation. The patient has history of liver cirrhosis and upper GI  bleed. However, his coronary arteries are heavily calcified with slightly suboptimal PCI results due to that. He initially could not take oral antiplatelet medications until after the procedure was finished. I elected to start Aggrastat for 6 hours only. The patient will need LAD PCI likely with her atherectomy in the near future.    TTE 03/10/17: Study Conclusions  - Left ventricle: The cavity size was normal. There was mild focal   basal hypertrophy of the septum. Systolic function was mildly to   moderately reduced. The estimated ejection fraction was in the   range of 40% to 45%. There is severe hypokinesis of the   mid-apicalanteroseptal, anterior, and apical myocardium. Cannot   exclude hypokinesis of the inferior myocardium. Left ventricular   diastolic function parameters were normal. - Aortic valve: A bicuspid morphology cannot be excluded; mildly   thickened, moderately calcified leaflets. - Mitral valve: Mildly thickened leaflets . There was mild   regurgitation. - Right ventricle: The cavity size was normal. Systolic function   was normal.  Patient Profile     62 y.o. male with history of liver cancer s/p resection, cirrhosis with prior GI bleed, polysubstance abuse with alcohol and prior cocaine abuse, COPD secondary to tobacco abuse, hepatitis C, and CAD with previous remote myocardial infarction who was admitted to [facility] on 03/09/17 with an inferior STEMI s/p PCI as detailed above.   Assessment & Plan    1. Inferior STEMI: -Status post successful PCI/DES with 2 overlapping DES to the RCA as detailed above. He does have residual disease as detailed above and will likely require intervention on the LAD in the near future with atherectomy at [facility] -Currently notes chest pain with deep inspiration and pulling upwards -Echo did not show any evidence of pericardial effusion, though cannot definitively rule out Dresslor syndrome -Given his liver disease, would ideally like to  avoid colchicine   -Has residual LAD disease as detailed above that will need likely intervention down the road, preferably as an outpatient, though if his pain persists, this may need to be done prior to discharge -Unable to use Imdur given his hypotension -Unable  to use Ranexa given his cirrhosis  -DAPT with ASA and Plavix (high bleeding risk given his comorbid conditions) -Lipitor, trend LFT as below -Lopressor on hold given hypotension -Aggressive risk factor modification -Echo as above -Cardiac rehab   2. Acute systolic CHF/ICM: -He does not appear grossly volume overloaded at this time -His relative hypotension has precluded scheduled metoprolol as well as addition of ACEi/ARB/spironolactone -Consider starting evidence-based heart failure therapy as BP allows -CHF education -Daily weights, strict Is and Os  3. History of liver cancer, cirrhosis, hepatitis C, and prior GI bleed: -Stable -Check HFP -Monitor closely on DAPT -Per IM  4. Tobacco abuse: -Cessation advised  5. HLD: -Lipitor as above  6. Hyperkalemia: -Resolved  7. AKI: -Improved  8. Hypotension: -Likely in the setting of his underlying liver disease -Less likely low output given EF is only minimally reduced -Give 500 mL NS bolus  9. Possible bicuspid aortic valve: -Clinically monitor -Outpatient follow up  Dispo: -If BP improves to the 92E systolic, could likely transfer to 2A today    For questions or updates, please contact [place] Please consult [email or web address] for contact info under Cardiology/STEMI.    Signed, Christell Faith, PA-C [place] Pager: [phone] 03/11/2017, 7:21 AM   Attending Note Patient seen and examined, agree with detailed note above,  Patient presentation and plan discussed on rounds.   Some atypical chest pain this morning on a cough or deep inspiration Feels somewhat better overall Blood pressure running low, no orthostasis symptoms, has been  in bed only  On physical exam with mildly decreased breath sounds bilaterally, no wheezes or rales, no JVD, heart sounds regular normal W9-N9, 2/6 systolic ejection murmur left sternal border, abdomen soft nontender, no significant lower extremity edema, alert and oriented  Lab work reviewed troponin trending down greater than 65 now 28 Creatinine 0.86, hematocrit 34  A/P 1) Inferior STEMI PCI x2 to the RCA Residual LAD disease will require repeat procedure at a later date, atherectomy at [facility] On aspirin Plavix, Lipitor Will restart beta-blocker when blood pressure allows Echocardiogram pending  2) cardiomyopathy, ischemic Ejection fraction 40-45% with wall motion abnormality Beta-blockers when blood pressure allows  3.  Liver cancer, cirrhosis, hepatitis C We will monitor closely on dual antiplatelet therapy    Greater than 50% was spent in counseling and coordination of care with patient Total encounter time 25 minutes or more   Signed: Esmond Plants  M.D., Ph.D. [facility]

## 2017-03-11 NOTE — Progress Notes (Signed)
   [place] at [facility] Day: 2 days Phillip Warren is a 62 y.o. male presenting with No chief complaint on file. .   Advance care planning discussed with patient at bedside. All questions in regards to overall condition and expected prognosis answered. The decision was made to continue current code status  CODE STATUS: Full Code Time spent: 18 minutes

## 2017-03-11 NOTE — Progress Notes (Signed)
Atypical Chest pain Chronic CAD with cirrhosis Low bp this AM IVF's to be given  Transfer to 2A   Phillip Warren, M.D.  [facility]  Medical Director [place] Director [facility] Cardio-Pulmonary Department

## 2017-03-11 NOTE — Progress Notes (Signed)
Pt has remained alert and oriented with c/o atypical mid-lower CP that is accompanied with coughing ->Percocet 1 tab PO has been effective for relief ->pain score 7 -> 2.  Pt has transitioned to [facility], SpO2 >90%. Lung sounds with rhonchi to bilat upper lobes, diminished in the bases.  Pt has remained in NSR, BP WNL.

## 2017-03-12 ENCOUNTER — Telehealth: Payer: Self-pay | Admitting: Cardiovascular Disease

## 2017-03-12 LAB — BASIC METABOLIC PANEL
Anion gap: 7 (ref 5–15)
BUN: 7 mg/dL (ref 6–20)
CHLORIDE: 105 mmol/L (ref 101–111)
CO2: 24 mmol/L (ref 22–32)
Calcium: 8.1 mg/dL — ABNORMAL LOW (ref 8.9–10.3)
Creatinine, Ser: 1.04 mg/dL (ref 0.61–1.24)
GFR calc Af Amer: >60 mL/min (ref >60)
GFR calc non Af Amer: >60 mL/min (ref >60)
GLUCOSE: 162 mg/dL — AB (ref 65–99)
POTASSIUM: 3.9 mmol/L (ref 3.5–5.1)
Sodium: 136 mmol/L (ref 135–145)

## 2017-03-12 MED ORDER — GABAPENTIN 300 MG PO CAPS: 300.0000 mg | ORAL_CAPSULE | Freq: Three times a day (TID) | ORAL | 2 refills | Status: DC

## 2017-03-12 MED ORDER — CLOPIDOGREL BISULFATE 75 MG PO TABS: 75.0000 mg | ORAL_TABLET | Freq: Every day | ORAL | 2 refills | Status: DC

## 2017-03-12 MED ORDER — POLYETHYLENE GLYCOL 3350 17 G PO PACK
17.0000 g | PACK | Freq: Every day | ORAL | Status: DC
  Administered 2017-03-12: 17 g via ORAL
  Filled 2017-03-12: qty 1

## 2017-03-12 MED ORDER — OXYCODONE HCL 5 MG PO TABS: 5.0000 mg | ORAL_TABLET | Freq: Three times a day (TID) | ORAL | 0 refills | Status: DC | PRN

## 2017-03-12 MED ORDER — ASPIRIN 81 MG PO CHEW: 81.0000 mg | CHEWABLE_TABLET | Freq: Every day | ORAL | 2 refills | Status: DC

## 2017-03-12 MED ORDER — CARVEDILOL 3.125 MG PO TABS: 3.1250 mg | ORAL_TABLET | Freq: Two times a day (BID) | ORAL | Status: DC

## 2017-03-12 MED ORDER — NICOTINE 21 MG/24HR TD PT24: 21.0000 mg | MEDICATED_PATCH | Freq: Every day | TRANSDERMAL | 0 refills | Status: DC

## 2017-03-12 MED ORDER — LISINOPRIL 2.5 MG PO TABS: 2.5000 mg | ORAL_TABLET | Freq: Every day | ORAL | 2 refills | Status: DC

## 2017-03-12 MED ORDER — ATORVASTATIN CALCIUM 20 MG PO TABS: 80.0000 mg | ORAL_TABLET | Freq: Every day | ORAL | Status: DC

## 2017-03-12 MED ORDER — ATORVASTATIN CALCIUM 20 MG PO TABS: 40.0000 mg | ORAL_TABLET | Freq: Every day | ORAL | Status: DC

## 2017-03-12 MED ORDER — ATORVASTATIN CALCIUM 40 MG PO TABS: 40.0000 mg | ORAL_TABLET | Freq: Every day | ORAL | 2 refills | Status: DC

## 2017-03-12 MED ORDER — CARVEDILOL 3.125 MG PO TABS: 3.1250 mg | ORAL_TABLET | Freq: Two times a day (BID) | ORAL | 2 refills | Status: DC

## 2017-03-12 MED ORDER — LISINOPRIL 5 MG PO TABS: 2.5000 mg | ORAL_TABLET | Freq: Every day | ORAL | Status: DC

## 2017-03-12 NOTE — Progress Notes (Addendum)
Patient stable for discharge.   PIVs discontinued, catheters intact; sites clean, dry, intact. Discharge instructions and follow-up reviewed. Patient verbalized understanding.  Transportation will meet patient at visitor's entrance to take patient home.   NT taking patient to visitor's entrance in wheelchair.   Patient also received scale from Care Management to go home with.

## 2017-03-12 NOTE — Telephone Encounter (Signed)
TCM....  Patient is being discharged later today  They saw Dr Fletcher Anon  They are scheduled to see Christell Faith on 03/26/17  They were seen for Stemi  They need to be seen within 2 weeks  Pt is not wait list   Please call

## 2017-03-12 NOTE — Progress Notes (Signed)
Spoke with Dr. Tressia Miners about patient's blood pressures, which have been soft while he's been here, and my concern that he is starting lisinopril and carvedilol rigght before discharge.  She agreed that I could hold the medications today and have him start them tomorrow, since he has been on beta blockers before.

## 2017-03-12 NOTE — Progress Notes (Deleted)
Doctor Pyreddy ordered one time dose of 40 mEQ orally for Potassium of 3.0. Will continue to monitor.

## 2017-03-12 NOTE — Progress Notes (Signed)
Nutrition Education Note  RD consulted for nutrition education regarding new onset CHF.  62 year old male with history of liver cancer s/p resection, cirrhosis with prior GI bleed, polysubstance abuse with alcohol and prior cocaine abuse, COPD secondary to tobacco abuse, hepatitis C, and CAD with previous remote MI who was admitted to [facility] on 03/09/17 with an inferior STEMI.   RD provided "Low Sodium Nutrition Therapy" handout from the [employer] of [employer]. Reviewed patient's dietary recall. Provided examples on ways to decrease sodium intake in diet. Discouraged intake of processed foods and use of salt shaker. Encouraged fresh fruits and vegetables as well as whole grain sources of carbohydrates to maximize fiber intake.   RD discussed why it is important for patient to adhere to diet recommendations, and emphasized the role of fluids, foods to avoid, and importance of weighing self daily. Teach back method used.  Expect fair to poor compliance.  Body mass index is 20.45 kg/m. Pt meets criteria for normal weight based on current BMI.  Current diet order is HH, patient is consuming approximately 25% of meals at this time. Labs and medications reviewed.  Pt discharging today. RD educated pt on the importance of adequate protein intake and recommended daily supplements and MVI.    No nutrition interventions warranted at this time. RD contact information provided. If additional nutrition issues arise, please re-consult RD.   Koleen Distance MS, RD, LDN Pager #- [phone] After Hours Pager: [phone]

## 2017-03-12 NOTE — Discharge Summary (Signed)
[facility] [place] NAME: Phillip Warren    MR#:  [id number]  DATE OF BIRTH:  1955/12/19  DATE OF ADMISSION:  03/09/2017   ADMITTING PHYSICIAN: Lance Coon, MD  DATE OF DISCHARGE: 03/12/2017  2:25 PM  PRIMARY CARE PHYSICIAN: Glendon Axe, MD   ADMISSION DIAGNOSIS:   ST elevation myocardial infarction (STEMI), unspecified artery (HCC) [I21.3] Acute ST elevation myocardial infarction (STEMI) of inferior wall ([place]) [I21.19]  DISCHARGE DIAGNOSIS:   Principal Problem:   ST elevation myocardial infarction (STEMI) ([place]) Active Problems:   Cirrhosis ([place])   COPD (chronic obstructive pulmonary disease) (HCC)   GERD (gastroesophageal reflux disease)   Alcohol abuse   History of GI bleed   Acute ST elevation myocardial infarction (STEMI) of inferior wall ([place])   SECONDARY DIAGNOSIS:   Past Medical History:  Diagnosis Date  . Alcohol abuse   . Anorexia    has trouble eating due to poor appetite  . Anxiety    trouble sleeping; lost two sisters and his mother close together  . Arthritis    hands, feet (burning, stinging)  . Cancer [facility])    liver lesion initial staging.  . CHF (congestive heart failure) ([place])   . Cirrhosis of liver ([place])   . Cocaine abuse ([place])   . COPD (chronic obstructive pulmonary disease) ([place])   . Coronary artery disease   . GERD (gastroesophageal reflux disease)   . H/O dizziness   . Headache   . Hepatitis    B and C (chronic)  . Marijuana use   . Myocardial infarction ([place])    massive MI at age 52  . Neuropathy   . Numbness and tingling    legs and feet bilat   . Pneumonia   . Repeated falls   . Shortness of breath dyspnea   . Tinnitus   . Tobacco abuse     HOSPITAL COURSE:   62 year old male with past medical history significant for alcohol use, liver cirrhosis, ongoing smoking, COPD, polysubstance abuse, GERD, history of variceal bleed neuropathy presents to the hospital secondary to chest  pain and noted to have STEMI.   1. Inferior STEMI-appreciate cardiology consult.  had PCI to mid RCA. He also has residual LAD disease which can be treated as outpatient with [facility] atherectomy at [facility] - Continue aspirin and Plavix. Patient had history of GI bleed with a known history of varices. -Low-dose beta blocker and statin. Echocardiogram with reduced EF -Blood pressure is soft. On low dose coreg and lisinopril -Has acute systolic heart failure secondary to underlying CAD.  EF of 45%. -Also has some pericardial pain. -Cardiac rehabilitation after discharge.  2.  Atypical chest pain-no pericardial effusion on echocardiogram, and less likely to be Dressler syndrome -Tender to touch and worsens with movement. Improving with oxycodone -Cannot try colchicine due to his cirrhosis.  Continue as needed pain medication.  3. Smoking-strongly counseled against smoking. Nicotine patch has been ordered in the hospital and prescription given  4. Liver cirrhosis-known history of hepatitis C and also alcohol abuse. Quit drinking about a year ago. -Also has history of variceal bleed. Monitor while on aspirin and Plavix. -Stable INR and platelets at this time.  5. Peripheral neuropathy-continue gabapentin   Ambulating well with no worsening of chest pain. Will be discharged home today   DISCHARGE CONDITIONS:   Guarded  CONSULTS OBTAINED:   Treatment Team:  Wellington Hampshire, MD  DRUG ALLERGIES:   Allergies  Allergen  Reactions  . Multihance [Gadobenate] Hives and Shortness Of Breath  . Hydrocodone Itching and Nausea And Vomiting  . Vicodin [Hydrocodone-Acetaminophen] Nausea Only and Nausea And Vomiting    Vomiting    DISCHARGE MEDICATIONS:   Allergies as of 03/12/2017      Reactions   Multihance [gadobenate] Hives, Shortness Of Breath   Hydrocodone Itching, Nausea And Vomiting   Vicodin [hydrocodone-acetaminophen] Nausea Only, Nausea And Vomiting   Vomiting        Medication List    STOP taking these medications   diphenhydrAMINE 25 MG tablet Commonly known as:  BENADRYL   gabapentin 800 MG tablet Commonly known as:  NEURONTIN Replaced by:  gabapentin 300 MG capsule   LYRICA 50 MG capsule Generic drug:  pregabalin   nortriptyline 10 MG capsule Commonly known as:  PAMELOR   propranolol 10 MG tablet Commonly known as:  INDERAL   tamsulosin 0.4 MG Caps capsule Commonly known as:  FLOMAX   traMADol 50 MG tablet Commonly known as:  ULTRAM     TAKE these medications   aspirin 81 MG chewable tablet Chew 1 tablet (81 mg total) by mouth daily. Start taking on:  03/13/2017   atorvastatin 40 MG tablet Commonly known as:  LIPITOR Take 1 tablet (40 mg total) by mouth daily at 6 PM.   carvedilol 3.125 MG tablet Commonly known as:  COREG Take 1 tablet (3.125 mg total) by mouth 2 (two) times daily with a meal.   clopidogrel 75 MG tablet Commonly known as:  PLAVIX Take 1 tablet (75 mg total) by mouth daily with breakfast. Start taking on:  03/13/2017   docusate sodium 100 MG capsule Commonly known as:  COLACE Take 1 capsule (100 mg total) by mouth 2 (two) times daily as needed for mild constipation.   gabapentin 300 MG capsule Commonly known as:  NEURONTIN Take 1 capsule (300 mg total) by mouth 3 (three) times daily. Replaces:  gabapentin 800 MG tablet   lisinopril 2.5 MG tablet Commonly known as:  PRINIVIL,ZESTRIL Take 1 tablet (2.5 mg total) by mouth daily.   nicotine 21 mg/24hr patch Commonly known as:  NICODERM CQ - dosed in mg/24 hours Place 1 patch (21 mg total) onto the skin daily. Start taking on:  03/13/2017   nitroGLYCERIN 0.4 MG SL tablet Commonly known as:  NITROSTAT Place 0.4 mg under the tongue every 5 (five) minutes as needed for chest pain.   omeprazole 40 MG capsule Commonly known as:  PRILOSEC Take 1 capsule (40 mg total) by mouth 2 (two) times daily at 10 AM and 5 PM.   oxyCODONE 5 MG immediate release  tablet Commonly known as:  ROXICODONE Take 1 tablet (5 mg total) by mouth every 8 (eight) hours as needed.        DISCHARGE INSTRUCTIONS:   1. PCP f/u in 1 week 2. Cardiology f/u in 2 weeks 3. Cardiac rehab  DIET:   Cardiac diet  ACTIVITY:   Activity as tolerated  OXYGEN:   Home Oxygen: No.  Oxygen Delivery: room air  DISCHARGE LOCATION:   home   If you experience worsening of your admission symptoms, develop shortness of breath, life threatening emergency, suicidal or homicidal thoughts you must seek medical attention immediately by calling 911 or calling your MD immediately  if symptoms less severe.  You Must read complete instructions/literature along with all the possible adverse reactions/side effects for all the Medicines you take and that have been prescribed to you. Take any  new Medicines after you have completely understood and accpet all the possible adverse reactions/side effects.   Please note  You were cared for by a hospitalist during your hospital stay. If you have any questions about your discharge medications or the care you received while you were in the hospital after you are discharged, you can call the unit and asked to speak with the hospitalist on call if the hospitalist that took care of you is not available. Once you are discharged, your primary care physician will handle any further medical issues. Please note that NO REFILLS for any discharge medications will be authorized once you are discharged, as it is imperative that you return to your primary care physician (or establish a relationship with a primary care physician if you do not have one) for your aftercare needs so that they can reassess your need for medications and monitor your lab values.    On the day of Discharge:  VITAL SIGNS:   Blood pressure 106/68, pulse (!) 103, temperature 97.6 F (36.4 C), temperature source Oral, resp. rate 14, height 6' (1.829 m), weight 68.4 kg (150 lb 12.7  oz), SpO2 95 %.  PHYSICAL EXAMINATION:    GENERAL:  62 y.o.-year-old patient lying in the bed with no acute distress.  EYES: Pupils equal, round, reactive to light and accommodation. No scleral icterus. Extraocular muscles intact.  HEENT: Head atraumatic, normocephalic. Oropharynx and nasopharynx clear.  NECK:  Supple, no jugular venous distention. No thyroid enlargement, no tenderness.  LUNGS: Normal breath sounds bilaterally, no wheezing, rales,rhonchi or crepitation. No use of accessory muscles of respiration.  CARDIOVASCULAR: S1, S2 normal. No murmurs, rubs, or gallops. Tenderness to palpation of midchest ABDOMEN: Soft, nontender, nondistended. Bowel sounds present. No organomegaly or mass.  EXTREMITIES: No pedal edema, cyanosis, or clubbing.  NEUROLOGIC: Cranial nerves II through XII are intact. Muscle strength 5/5 in all extremities. Sensation intact. Gait not checked.  PSYCHIATRIC: The patient is alert and oriented x 3.  SKIN: No obvious rash, lesion, or ulcer.     DATA REVIEW:   CBC Recent Labs  Lab 03/11/17 0401  WBC 3.8  HGB 11.4*  HCT 34.5*  PLT 100*    Chemistries  Recent Labs  Lab 03/09/17 1900  03/12/17 0407  NA 133*   < > 136  K 3.4*   < > 3.9  CL 98*   < > 105  CO2 23   < > 24  GLUCOSE 130*   < > 162*  BUN 9   < > 7  CREATININE 0.97   < > 1.04  CALCIUM 9.1   < > 8.1*  AST 116*  --   --   ALT 108*  --   --   ALKPHOS 105  --   --   BILITOT 0.8  --   --    < > = values in this interval not displayed.     Microbiology Results  Results for orders placed or performed during the hospital encounter of 03/09/17  MRSA PCR Screening     Status: None   Collection Time: 03/09/17  9:16 PM  Result Value Ref Range Status   MRSA by PCR NEGATIVE NEGATIVE Final    Comment:        The GeneXpert MRSA Assay (FDA approved for NASAL specimens only), is one component of a comprehensive MRSA colonization surveillance program. It is not intended to diagnose  MRSA infection nor to guide or monitor treatment for MRSA infections. Performed  at [facility], [address], [place], [place] [address]     RADIOLOGY:  No results found.   Management plans discussed with the patient, family and they are in agreement.  CODE STATUS:     Code Status Orders  (From admission, onward)        Start     Ordered   03/09/17 2104  Full code  Continuous     03/09/17 2103    Code Status History    Date Active Date Inactive Code Status Order ID Comments User Context   01/02/2017 14:10 01/03/2017 13:30 Full Code [id number]  Vaughan Basta, MD Inpatient    Advance Directive Documentation     Most Recent Value  Type of Advance Directive  Healthcare Power of Attorney  Pre-existing out of facility DNR order (yellow form or pink MOST form)  No data  "MOST" Form in Place?  No data      TOTAL TIME TAKING CARE OF THIS PATIENT: 38 minutes.    Gladstone Lighter M.D on 03/12/2017 at 4:25 PM  Between 7am to 6pm - Pager - [phone]  After 6pm go to [email or web address] - Proofreader  [facility]  Office  [phone]  CC: Primary care physician; Glendon Axe, MD   Note: This dictation was prepared with Dragon dictation along with smaller phrase technology. Any transcriptional errors that result from this process are unintentional.

## 2017-03-12 NOTE — Plan of Care (Signed)
  Progressing Clinical Measurements: Ability to maintain clinical measurements within normal limits will improve 03/12/2017 0107 - Progressing by Liliane Channel, RN Cardiovascular complication will be avoided 03/12/2017 0107 - Progressing by Liliane Channel, RN Pain Managment: General experience of comfort will improve 03/12/2017 0107 - Progressing by Liliane Channel, RN 03/12/2017 0107 - Progressing by Liliane Channel, RN Safety: Ability to remain free from injury will improve 03/12/2017 0107 - Progressing by Liliane Channel, RN 03/12/2017 0107 - Progressing by Liliane Channel, RN

## 2017-03-12 NOTE — Progress Notes (Signed)
Progress Note  Patient Name: Phillip Warren Date of Encounter: 03/12/2017  Primary Cardiologist: new to [facility] - consult by Arida  Subjective   Overall, feels better. Still with chest pain if he pulls himself up. Tolerating DAPT. Transferred out of ICU to 2A. BP improved to the 836O systolic. Constipated, no BM since 2/10.   Inpatient Medications    Scheduled Meds: . aspirin  81 mg Oral Daily  . atorvastatin  40 mg Oral q1800  . clopidogrel  75 mg Oral Q breakfast  . gabapentin  800 mg Oral QID  . heparin injection (subcutaneous)  5,000 Units Subcutaneous Q8H  . nicotine  21 mg Transdermal Daily  . pantoprazole  40 mg Oral Daily  . sodium chloride flush  3 mL Intravenous Q12H   Continuous Infusions: . sodium chloride     PRN Meds: sodium chloride, docusate sodium, ipratropium-albuterol, morphine injection, nitroGLYCERIN, ondansetron (ZOFRAN) IV, oxyCODONE-acetaminophen, sodium chloride flush   Vital Signs    Vitals:   03/11/17 1954 03/11/17 1956 03/12/17 0308 03/12/17 0510  BP: (!) 83/62 133/72 90/67 110/78  Pulse: (!) 107 (!) 113 85 89  Resp:   18   Temp:   97.8 F (36.6 C)   TempSrc:   Oral   SpO2: 99% 100% 98%   Weight:      Height:        Intake/Output Summary (Last 24 hours) at 03/12/2017 0807 Last data filed at 03/12/2017 0450 Gross per 24 hour  Intake 1254 ml  Output 2560 ml  Net -1306 ml   Filed Weights   03/09/17 1903 03/09/17 2112 03/11/17 1533  Weight: 140 lb (63.5 kg) 149 lb 11.1 oz (67.9 kg) 150 lb 12.7 oz (68.4 kg)    Telemetry    NSR to sinus tachycardia with heart rates from the 80s to 120s bpm, currently sinus tachycardia with heart rates in the low 100s bpm - Personally Reviewed  ECG    n/a - Personally Reviewed  Physical Exam   GEN: Frail appearing; No acute distress.   Neck: No JVD. Cardiac: Mildly tachycardic, II/VI systolic murmur at the apex, no rubs, or gallops. Right radial cardiac cath site without bleeding, bruising,  swelling, erythema, or warmth. Radial pulse 2+.  Respiratory: Diminihed breath sounds bilaterally.  GI: Soft, nontender, non-distended.   MS: No edema; No deformity. Neuro:  Alert and oriented x 3; Nonfocal.  Psych: Normal affect.  Labs    Chemistry Recent Labs  Lab 03/09/17 1900 03/10/17 0333 03/11/17 0401 03/12/17 0407  NA 133* 133* 133* 136  K 3.4* 5.2* 3.6 3.9  CL 98* 104 102 105  CO2 23 22 24 24   GLUCOSE 130* 148* 92 162*  BUN 9 11 9 7   CREATININE 0.97 1.22 0.86 1.04  CALCIUM 9.1 8.1* 8.1* 8.1*  PROT 9.0*  --   --   --   ALBUMIN 3.8  --   --   --   AST 116*  --   --   --   ALT 108*  --   --   --   ALKPHOS 105  --   --   --   BILITOT 0.8  --   --   --   GFRNONAA >60 >60 >60 >60  GFRAA >60 >60 >60 >60  ANIONGAP 12 7 7 7      Hematology Recent Labs  Lab 03/09/17 1900 03/10/17 0333 03/11/17 0401  WBC 8.5 7.9 3.8  RBC 4.70 4.49 3.87*  HGB  14.0 13.4 11.4*  HCT 41.9 39.9* 34.5*  MCV 89.2 88.8 89.0  MCH 29.8 29.8 29.3  MCHC 33.4 33.5 32.9  RDW 15.1* 15.0* 14.9*  PLT 179 146* 100*    Cardiac Enzymes Recent Labs  Lab 03/09/17 1900 03/10/17 0206 03/10/17 0658 03/11/17 0401  TROPONINI 0.26* 38.89* >65.00* 28.03*   No results for input(s): TROPIPOC in the last 168 hours.   BNPNo results for input(s): BNP, PROBNP in the last 168 hours.   DDimer No results for input(s): DDIMER in the last 168 hours.   Radiology    No results found.  Cardiac Studies   LHC 03/09/2017: Coronary Findings   Diagnostic  Dominance: Right  Left Main  There is mild diffuse disease throughout the vessel.  Left Anterior Descending  Prox LAD to Mid LAD lesion 90% stenosed  Prox LAD to Mid LAD lesion is 90% stenosed. The lesion is type C. The lesion is severely calcified.  First Diagonal Branch  There is moderate disease in the vessel.  Second Diagonal Branch  There is mild disease in the vessel.  Left Circumflex  Ost Cx to Prox Cx lesion 80% stenosed  Ost Cx to Prox Cx  lesion is 80% stenosed.  Right Coronary Artery  Prox RCA lesion 30% stenosed  Prox RCA lesion is 30% stenosed.  Mid RCA-1 lesion 70% stenosed  Mid RCA-1 lesion is 70% stenosed. The lesion is type C.  Mid RCA-2 lesion 99% stenosed  Mid RCA-2 lesion is 99% stenosed. Vessel is the culprit lesion. The lesion is type C and heavily thrombotic. The lesion is severely calcified.  Right Posterior Descending Artery  RPDA lesion 70% stenosed  RPDA lesion is 70% stenosed.  Right Posterior Atrioventricular Branch  Post Atrio lesion 40% stenosed  Post Atrio lesion is 40% stenosed.  Intervention   Mid RCA-1 lesion  Stent  Lesion length: 17 mm. Lesion crossed with guidewire. Pre-stent angioplasty was performed using a BALLOON TREK RX 3.0X20. Maximum pressure: 12 atm. Inflation time: 30 sec. A drug-eluting stent was successfully placed using a STENT SIERRA 3.50 X 18 MM. Maximum pressure: 12 atm. Inflation time: 20 sec. Stent overlaps previously placed stent. Post-stent angioplasty was performed using a BALLOON [place] TREK RX 3.5X8. Maximum pressure: 20 atm. Inflation time: 10 sec. After deploying the initial stent, the area proximal to this appeared to be hazy and ulcerated. I had significant difficulty advancing the noncompliant balloon through this area and thus I suspected that it was significant. Thus, I decided to treat this area. There was significant difficulty advancing equipment due to heavy calcifications. This required using a second wire as a buddy wire. I was able to post dilate both stents with a short 3.5 x 8 noncompliant balloon.  Post-Intervention Lesion Assessment  The intervention was successful. Pre-interventional TIMI flow is 2. Post-intervention TIMI flow is 3. Treated lesion length: 18 mm. No complications occurred at this lesion.  There is a 10% residual stenosis post intervention.  Mid RCA-2 lesion  Stent  Lesion length: 22 mm. Lesion crossed with guidewire using a WIRE RUNTHROUGH  .[id number]. Pre-stent angioplasty was performed using a BALLOON TREK RX 2.5X15. Maximum pressure: 12 atm. Inflation time: 30 sec. A drug-eluting stent was successfully placed using a STENT SIERRA 3.25 X 23 MM. Maximum pressure: 12 atm. Inflation time: 20 sec. Post-stent angioplasty was performed using a BALLOON [place] TREK RX 3.5X8. Maximum pressure: 10 atm. Inflation time: 10 sec.  Post-Intervention Lesion Assessment  The intervention was successful. Pre-interventional TIMI  flow is 2. Post-intervention TIMI flow is 3. Treated lesion length: 22 mm. No complications occurred at this lesion.  There is a 5% residual stenosis post intervention.  Coronary Diagrams   Diagnostic Diagram       Post-Intervention Diagram        Conclusion     Prox RCA lesion is 30% stenosed.  Post Atrio lesion is 40% stenosed.  RPDA lesion is 70% stenosed.  Mid RCA-1 lesion is 70% stenosed.  Mid RCA-2 lesion is 99% stenosed.  A drug-eluting stent was successfully placed using a STENT SIERRA 3.25 X 23 MM.  Post intervention, there is a 5% residual stenosis.  A drug-eluting stent was successfully placed using a STENT SIERRA 3.50 X 18 MM.  Post intervention, there is a 10% residual stenosis.  Prox LAD to Mid LAD lesion is 90% stenosed.  Ost Cx to Prox Cx lesion is 80% stenosed.  1. Severe heavily calcified three-vessel coronary artery disease with the culprit being subtotal occlusion of the right coronary artery with heavy thrombus burden. 2. Mildly elevated left ventricular end-diastolic pressure. Left ventricular angiography was not performed. 3. Successful angioplasty and drug-eluting stent placement x2 to the mid right coronary artery. Very difficult procedure due to heavy calcifications. This required a buddy wire and multiple balloon inflations.  Recommendations:  This is a very difficult situation. The patient has history of liver cirrhosis and upper GI bleed. However, his coronary  arteries are heavily calcified with slightly suboptimal PCI results due to that. He initially could not take oral antiplatelet medications until after the procedure was finished. I elected to start Aggrastat for 6 hours only. The patient will need LAD PCI likely with her atherectomy in the near future.    TTE 03/10/17: Study Conclusions  - Left ventricle: The cavity size was normal. There was mild focal   basal hypertrophy of the septum. Systolic function was mildly to   moderately reduced. The estimated ejection fraction was in the   range of 40% to 45%. There is severe hypokinesis of the   mid-apicalanteroseptal, anterior, and apical myocardium. Cannot   exclude hypokinesis of the inferior myocardium. Left ventricular   diastolic function parameters were normal. - Aortic valve: A bicuspid morphology cannot be excluded; mildly   thickened, moderately calcified leaflets. - Mitral valve: Mildly thickened leaflets . There was mild   regurgitation. - Right ventricle: The cavity size was normal. Systolic function   was normal.  Patient Profile     62 y.o. male with history of liver cancer s/p resection, cirrhosis with prior GI bleed, polysubstance abuse with alcohol and prior cocaine abuse, COPD secondary to tobacco abuse, hepatitis C, and CAD with previous remote myocardial infarction who was admitted to [facility] on 03/09/17 with an inferior STEMI s/p PCI as detailed above.   Assessment & Plan    1. Inferior STEMI: -Status post successful PCI/DES with 2 overlapping DES to the RCA as detailed above. He does have residual disease as detailed above and will likely require intervention on the LAD in the near future with atherectomy at [facility] -Currently notes chest pain with deep inspiration and pulling upwards, this is improving -Echo did not show any evidence of pericardial effusion -Given his liver disease, would ideally like to avoid colchicine   -Has residual LAD disease as detailed above  that will need likely intervention down the road, preferably as an outpatient -Unable to use Imdur given his hypotension -Unable to use Ranexa given his cirrhosis  -DAPT with  ASA and Plavix (high bleeding risk given his comorbid conditions) -Lipitor, trend LFT as below -Restart Lopressor at 12.5 mg bid given improvement in BP to the 191Y systolic  -Aggressive risk factor modification -Echo as above -Cardiac rehab  -Post cath instructions discussed   2. Acute systolic CHF/ICM: -He does not appear grossly volume overloaded at this time -His relative hypotension has led to holding of metoprolol this admissions and precluded addition of ACEi/ARB/spironolactone -Re-start low-dose metoprolol 12.5 mg bid given CHF and tachycardia noted on telemetry -CHF education -Daily weights, strict Is and Os  3. History of liver cancer, cirrhosis, hepatitis C, and prior GI bleed: -Stable -Check HFP -Monitor closely on DAPT -Per IM  4. Tobacco abuse: -Cessation advised  5. HLD: -Lipitor as above  6. Hyperkalemia: -Resolved  7. AKI: -Improved  8. Hypotension: -Improved -Given relative hypotension, will continue to hold metoprolol at this time, consider starting as an outpatient   9. Possible bicuspid aortic valve: -Clinically monitor -Outpatient follow up  10. Constipation: -Add Miralax  Dispo: -Ambulate in the hallway today   For questions or updates, please contact [place] Please consult [email or web address] for contact info under Cardiology/STEMI.    Signed, Christell Faith, PA-C [place] Pager: [phone] 03/12/2017, 8:07 AM   Attending Note Patient seen and examined, agree with detailed note above,  Patient presentation and plan discussed on rounds.   Feeling better today, anorexia Has not ambulated very far No bowel movement for 4 days  On physical examination thin in no distress lungs with moderately decreased breath sounds throughout otherwise clear  heart sounds regular tachycardic no murmurs appreciated abdomen thin soft nontender no significant lower extremity edema  Lab work reviewed showing creatinine 1.04 potassium 3.9 sodium 136   A/P 1) Inferior STEMI PCI x2 to the RCA Residual LAD disease will require repeat procedure at a later date, atherectomy at [facility] On aspirin Plavix, Lipitor Would recommend we start carvedilol , heart rate is elevated and he has stable blood pressure -Would ambulate, could consider discharge,  follow-up with Dr. Fletcher Anon, Will need to be seen as outpatient to schedule intervention on his LAD  2) cardiomyopathy, ischemic Ejection fraction 40-45% with wall motion abnormality Beta blocker today, consider starting carvedilol  3.  Liver cancer, cirrhosis, hepatitis C We will monitor closely on dual antiplatelet therapy   Greater than 50% was spent in counseling and coordination of care with patient Total encounter time 25 minutes or more   Signed: Esmond Plants  M.D., Ph.D. [facility]

## 2017-03-12 NOTE — Telephone Encounter (Signed)
Patient currently admitted at this time. 

## 2017-03-12 NOTE — Progress Notes (Signed)
62 year old male with history of liver cancer s/p resection, cirrhosis with prior GI bleed, polysubstance abuse with alcohol and prior cocaine abuse, COPD secondary to tobacco abuse, hepatitis C, and CAD with previous remote MI who was admitted to [facility] on 03/09/17 with an inferior STEMI.  Patient underwent cardiac cath.  Patient with severe heavily calcified 3-vessel disease with the culprit being subtotal occlusion  Patient S/P DES to mid-RCA lesions x 2.  Post intervention there was 5% residual stenosis of one and 10% residual stenosis with the other.  There is also a a lesion of the prox LAD to Mid LAD lesion which is 90% stenosed that will need intervention in the near future as an outpatient.    Patient also has Acute Systolic CHF/ICM.  Echo performed this admission revealed an EF of 40 - 45%.  Due to his hypotension patient has been unable to tolerate metoprolol this admission nor ACEi/ARB/Spironolactone.  MD planned to start patient on low dose metoprolol 12.5 mg bid today.   Patient sitting up in the bed watching TV. Patient reports he lives alone except for a new puppy.  Patient does his own cooking and food preparation.  Patient has two sons with one living locally and the other one lives at the [place].     "Heart Attack Bouncing Back" booklet given and reviewed with patient. Discussed the definition of CAD. Reviewed the location of CAD and where stent was placed. Informed patient he will be receiving a stent card if he does not already have it.  Explained the purpose of the stent card. Instructed patient to keep stent card in his wallet.  Discussed modifiable risk factors including controlling blood pressure, cholesterol, and blood sugar; following heart healthy diet; maintaining healthy weight; exercise; and smoking cessation, if applicable. ?Note: Patient is a a current every day smoker.    Discussed cardiac medications including rationale for taking, mechanisms of action, and side effects.  Stressed the importance of taking medications as prescribed.  Discussed emergency plan for heart attack symptoms. Patient verbalized understanding of need to call 911 and not to drive herself to ER if having cardiac symptoms / chest pain. Patient verbalized understanding of how to use NTG and when to call 911.    Heart healthy diet of low sodium, low fat, low cholesterol heart healthy diet discussed. Information on diet provided.?Dietitian Consultation entered.??  Exercise - Patient does not currently exercise. Benefits of exercise discussed. Explained to patient that he has been referred to Cardiac Rehab. Overview of the program provided. Patient interested in participating, but would like to wait until he has the second procedure on his LAD before starting the program.  Patient interested in participating.  Brochure on Cardiac Rehab provided as well as the informational sheet/billing codes for Cardiac Rehab. Patient uses [facility] for getting to his appointments.    Smoking Cessation - Patient is a current every day smoker.  Patient reported to this RN that he has not been craving a cigarette since he was admitted.  Patient is wearing a nicotine patch.  Discussed the effects smoking has on the body.  Provided patient with the informational sheet, "Thinking About Quitting Tobacco: Yes, You Can!!!" Reviewed this information with patient.  Patient reported he has tried Chantix in the past, but was unable to tolerate Chantix.  Patient encouraged to quit smoking.     CHF Education:?? Educational session with patient completed. ? Provided patient with "Living Better with Heart Failure" packet. Briefly reviewed  definition of heart failure and signs and symptoms of an exacerbation.  Discussed the meaning of EF and his preliminary value as compared to normal value.?Explained to patient that HF is a chronic illness which requires self-assessment /self-management along with help from the  cardiologist/PCP/HF Clinic.?? ? *Reviewed importance of and reason behind checking weight daily in the AM, after using the bathroom, but before getting dressed.?Patient will need scales. ?  Reviewed the following information with patient:  *Discussed when to call the Dr= weight gain of >2-3lb overnight of 5lb in a week,  *Discussed yellow zone= call MD: weight gain of >2-3lb overnight of 5lb in a week, increased swelling, increased SOB when lying down, chest discomfort, dizziness, increased fatigue *Red Zone= call 911: struggle to breath, fainting or near fainting, significant chest pain   *Reviewed low sodium diet-provided handout of recommended and not recommended foods. ? Reviewed reading labels with patient. Discussed fluid intake with patient as well. Patient not currently on a fluid restriction, but advised no more than 8-8 ounces glass of fluids per day.? ? *Instructed patient to take medications as prescribed for heart failure. Explained briefly why pt is on the medications (either make you feel better, live longer or keep you out of the hospital) and discussed monitoring and side effects.   *Smoking Cessation - See above.  ? ? *Exercise - See above.  Patient encouraged to remain as active as possible.??  Again, reviewed the 5 Steps to Living Better with Heart Failure.  Patient thanked me for providing the above information. ?  Roanna Epley, RN, BSN, [facility] Cardiovascular and Pulmonary Nurse Navigator

## 2017-03-12 NOTE — Care Management (Signed)
Provided patient with scales for daily weights.  he denies having any issues with getting to MD appts or paying for medications.  Is not discharging on cost prohibitive anti platelets medications

## 2017-03-13 NOTE — Telephone Encounter (Signed)
Left voicemail message to call back  

## 2017-03-16 ENCOUNTER — Other Ambulatory Visit: Payer: Self-pay

## 2017-03-16 ENCOUNTER — Inpatient Hospital Stay: Payer: Medicare Other

## 2017-03-16 ENCOUNTER — Encounter
Admission: EM | Disposition: A | Discharge status: Home / Self Care | Payer: Self-pay | Source: Home / Self Care | Attending: Internal Medicine

## 2017-03-16 ENCOUNTER — Inpatient Hospital Stay
Admission: EM | Admit: 2017-03-16 | Discharge: 2017-03-18 | DRG: 250 | Disposition: A | Discharge status: Home / Self Care | Payer: Medicare Other | Attending: Internal Medicine | Admitting: Internal Medicine

## 2017-03-16 DIAGNOSIS — M19042 Primary osteoarthritis, left hand: Secondary | ICD-10-CM | POA: Diagnosis present

## 2017-03-16 DIAGNOSIS — Z9114 Patient's other noncompliance with medication regimen: Secondary | ICD-10-CM

## 2017-03-16 DIAGNOSIS — E876 Hypokalemia: Secondary | ICD-10-CM | POA: Diagnosis present

## 2017-03-16 DIAGNOSIS — M19041 Primary osteoarthritis, right hand: Secondary | ICD-10-CM | POA: Diagnosis present

## 2017-03-16 DIAGNOSIS — E785 Hyperlipidemia, unspecified: Secondary | ICD-10-CM | POA: Diagnosis present

## 2017-03-16 DIAGNOSIS — F17210 Nicotine dependence, cigarettes, uncomplicated: Secondary | ICD-10-CM | POA: Diagnosis present

## 2017-03-16 DIAGNOSIS — I221 Subsequent ST elevation (STEMI) myocardial infarction of inferior wall: Secondary | ICD-10-CM | POA: Diagnosis present

## 2017-03-16 DIAGNOSIS — T82867A Thrombosis of cardiac prosthetic devices, implants and grafts, initial encounter: Secondary | ICD-10-CM | POA: Diagnosis present

## 2017-03-16 DIAGNOSIS — J449 Chronic obstructive pulmonary disease, unspecified: Secondary | ICD-10-CM | POA: Diagnosis present

## 2017-03-16 DIAGNOSIS — Z7982 Long term (current) use of aspirin: Secondary | ICD-10-CM | POA: Diagnosis not present

## 2017-03-16 DIAGNOSIS — R0600 Dyspnea, unspecified: Secondary | ICD-10-CM | POA: Diagnosis not present

## 2017-03-16 DIAGNOSIS — I509 Heart failure, unspecified: Secondary | ICD-10-CM | POA: Diagnosis present

## 2017-03-16 DIAGNOSIS — I2510 Atherosclerotic heart disease of native coronary artery without angina pectoris: Secondary | ICD-10-CM | POA: Diagnosis not present

## 2017-03-16 DIAGNOSIS — I25110 Atherosclerotic heart disease of native coronary artery with unstable angina pectoris: Secondary | ICD-10-CM | POA: Diagnosis present

## 2017-03-16 DIAGNOSIS — I319 Disease of pericardium, unspecified: Secondary | ICD-10-CM | POA: Diagnosis not present

## 2017-03-16 DIAGNOSIS — Z79899 Other long term (current) drug therapy: Secondary | ICD-10-CM

## 2017-03-16 DIAGNOSIS — Z7902 Long term (current) use of antithrombotics/antiplatelets: Secondary | ICD-10-CM | POA: Diagnosis not present

## 2017-03-16 DIAGNOSIS — I213 ST elevation (STEMI) myocardial infarction of unspecified site: Secondary | ICD-10-CM | POA: Diagnosis present

## 2017-03-16 DIAGNOSIS — K219 Gastro-esophageal reflux disease without esophagitis: Secondary | ICD-10-CM | POA: Diagnosis present

## 2017-03-16 DIAGNOSIS — J9600 Acute respiratory failure, unspecified whether with hypoxia or hypercapnia: Secondary | ICD-10-CM

## 2017-03-16 DIAGNOSIS — I238 Other current complications following acute myocardial infarction: Secondary | ICD-10-CM | POA: Diagnosis not present

## 2017-03-16 DIAGNOSIS — F1020 Alcohol dependence, uncomplicated: Secondary | ICD-10-CM | POA: Diagnosis present

## 2017-03-16 DIAGNOSIS — Z888 Allergy status to other drugs, medicaments and biological substances status: Secondary | ICD-10-CM | POA: Diagnosis not present

## 2017-03-16 DIAGNOSIS — I5021 Acute systolic (congestive) heart failure: Secondary | ICD-10-CM | POA: Diagnosis present

## 2017-03-16 DIAGNOSIS — Z8249 Family history of ischemic heart disease and other diseases of the circulatory system: Secondary | ICD-10-CM

## 2017-03-16 DIAGNOSIS — Z885 Allergy status to narcotic agent status: Secondary | ICD-10-CM

## 2017-03-16 DIAGNOSIS — I2111 ST elevation (STEMI) myocardial infarction involving right coronary artery: Secondary | ICD-10-CM | POA: Diagnosis not present

## 2017-03-16 DIAGNOSIS — I340 Nonrheumatic mitral (valve) insufficiency: Secondary | ICD-10-CM | POA: Diagnosis not present

## 2017-03-16 DIAGNOSIS — C220 Liver cell carcinoma: Secondary | ICD-10-CM | POA: Diagnosis present

## 2017-03-16 DIAGNOSIS — K7030 Alcoholic cirrhosis of liver without ascites: Secondary | ICD-10-CM | POA: Diagnosis present

## 2017-03-16 DIAGNOSIS — R079 Chest pain, unspecified: Secondary | ICD-10-CM | POA: Diagnosis present

## 2017-03-16 DIAGNOSIS — I255 Ischemic cardiomyopathy: Secondary | ICD-10-CM | POA: Diagnosis not present

## 2017-03-16 DIAGNOSIS — Z955 Presence of coronary angioplasty implant and graft: Secondary | ICD-10-CM | POA: Diagnosis not present

## 2017-03-16 DIAGNOSIS — F1910 Other psychoactive substance abuse, uncomplicated: Secondary | ICD-10-CM | POA: Diagnosis not present

## 2017-03-16 HISTORY — PX: CORONARY/GRAFT ACUTE MI REVASCULARIZATION: CATH118305

## 2017-03-16 HISTORY — PX: LEFT HEART CATH AND CORONARY ANGIOGRAPHY: CATH118249

## 2017-03-16 LAB — CBC WITH DIFFERENTIAL/PLATELET
Basophils Absolute: 0.1 10*3/uL (ref 0–0.1)
Basophils Relative: 1 %
EOS ABS: 0.6 10*3/uL (ref 0–0.7)
EOS PCT: 5 %
HCT: 39.8 % — ABNORMAL LOW (ref 40.0–52.0)
Hemoglobin: 13 g/dL (ref 13.0–18.0)
LYMPHS ABS: 2.4 10*3/uL (ref 1.0–3.6)
Lymphocytes Relative: 19 %
MCH: 29.4 pg (ref 26.0–34.0)
MCHC: 32.7 g/dL (ref 32.0–36.0)
MCV: 89.9 fL (ref 80.0–100.0)
MONO ABS: 0.7 10*3/uL (ref 0.2–1.0)
Monocytes Relative: 6 %
Neutro Abs: 8.8 10*3/uL — ABNORMAL HIGH (ref 1.4–6.5)
Neutrophils Relative %: 69 %
PLATELETS: 232 10*3/uL (ref 150–440)
RBC: 4.42 MIL/uL (ref 4.40–5.90)
RDW: 15.2 % — AB (ref 11.5–14.5)
WBC: 12.6 10*3/uL — ABNORMAL HIGH (ref 3.8–10.6)

## 2017-03-16 LAB — COMPREHENSIVE METABOLIC PANEL
ALBUMIN: 3.6 g/dL (ref 3.5–5.0)
ALT: 75 U/L — AB (ref 17–63)
AST: 92 U/L — AB (ref 15–41)
Alkaline Phosphatase: 86 U/L (ref 38–126)
Anion gap: 15 (ref 5–15)
BILIRUBIN TOTAL: 1 mg/dL (ref 0.3–1.2)
BUN: 12 mg/dL (ref 6–20)
CHLORIDE: 100 mmol/L — AB (ref 101–111)
CO2: 18 mmol/L — ABNORMAL LOW (ref 22–32)
CREATININE: 1.13 mg/dL (ref 0.61–1.24)
Calcium: 8.6 mg/dL — ABNORMAL LOW (ref 8.9–10.3)
GFR calc Af Amer: >60 mL/min (ref >60)
GLUCOSE: 209 mg/dL — AB (ref 65–99)
Potassium: 2.9 mmol/L — ABNORMAL LOW (ref 3.5–5.1)
Sodium: 133 mmol/L — ABNORMAL LOW (ref 135–145)
TOTAL PROTEIN: 8.1 g/dL (ref 6.5–8.1)

## 2017-03-16 LAB — GLUCOSE, CAPILLARY: Glucose-Capillary: 211 mg/dL — ABNORMAL HIGH (ref 65–99)

## 2017-03-16 LAB — POCT ACTIVATED CLOTTING TIME: Activated Clotting Time: 324 seconds

## 2017-03-16 LAB — TROPONIN I: Troponin I: 0.65 ng/mL (ref <0.03)

## 2017-03-16 LAB — MRSA PCR SCREENING: MRSA BY PCR: NEGATIVE

## 2017-03-16 SURGERY — CORONARY/GRAFT ACUTE MI REVASCULARIZATION
Anesthesia: Moderate Sedation

## 2017-03-16 MED ORDER — ENOXAPARIN SODIUM 30 MG/0.3ML ~~LOC~~ SOLN
30.0000 mg | SUBCUTANEOUS | Status: DC
  Administered 2017-03-17 – 2017-03-18 (×2): 30 mg via SUBCUTANEOUS
  Filled 2017-03-16 (×2): qty 0.3

## 2017-03-16 MED ORDER — ONDANSETRON HCL 4 MG/2ML IJ SOLN
4.0000 mg | Freq: Four times a day (QID) | INTRAMUSCULAR | Status: DC | PRN
  Administered 2017-03-17 (×3): 4 mg via INTRAVENOUS
  Filled 2017-03-16 (×3): qty 2

## 2017-03-16 MED ORDER — ACETAMINOPHEN 325 MG PO TABS: 650.0000 mg | ORAL_TABLET | ORAL | Status: DC | PRN

## 2017-03-16 MED ORDER — SODIUM CHLORIDE 0.9% FLUSH
3.0000 mL | Freq: Two times a day (BID) | INTRAVENOUS | Status: DC
  Administered 2017-03-16 – 2017-03-17 (×3): 3 mL via INTRAVENOUS

## 2017-03-16 MED ORDER — PANTOPRAZOLE SODIUM 40 MG IV SOLR
40.0000 mg | Freq: Every day | INTRAVENOUS | Status: DC
  Administered 2017-03-16: 40 mg via INTRAVENOUS
  Filled 2017-03-16: qty 40

## 2017-03-16 MED ORDER — ALBUTEROL SULFATE (2.5 MG/3ML) 0.083% IN NEBU
INHALATION_SOLUTION | RESPIRATORY_TRACT | Status: AC
  Filled 2017-03-16: qty 3

## 2017-03-16 MED ORDER — VERAPAMIL HCL 2.5 MG/ML IV SOLN
INTRAVENOUS | Status: DC | PRN
  Administered 2017-03-16: 3 ug via INTRA_ARTERIAL

## 2017-03-16 MED ORDER — GABAPENTIN 300 MG PO CAPS
300.0000 mg | ORAL_CAPSULE | Freq: Three times a day (TID) | ORAL | Status: DC
  Administered 2017-03-16 – 2017-03-18 (×5): 300 mg via ORAL
  Filled 2017-03-16 (×5): qty 1

## 2017-03-16 MED ORDER — NICOTINE 21 MG/24HR TD PT24
21.0000 mg | MEDICATED_PATCH | Freq: Every day | TRANSDERMAL | Status: DC
  Administered 2017-03-16 – 2017-03-18 (×3): 21 mg via TRANSDERMAL
  Filled 2017-03-16 (×3): qty 1

## 2017-03-16 MED ORDER — HEPARIN (PORCINE) IN NACL 2-0.9 UNIT/ML-% IJ SOLN
INTRAMUSCULAR | Status: AC | PRN
  Administered 2017-03-16: 1000 mL via INTRA_ARTERIAL

## 2017-03-16 MED ORDER — DOCUSATE SODIUM 100 MG PO CAPS: 100.0000 mg | ORAL_CAPSULE | Freq: Two times a day (BID) | ORAL | Status: DC | PRN

## 2017-03-16 MED ORDER — ATORVASTATIN CALCIUM 20 MG PO TABS
40.0000 mg | ORAL_TABLET | Freq: Every day | ORAL | Status: DC
  Administered 2017-03-17: 40 mg via ORAL
  Filled 2017-03-16: qty 2

## 2017-03-16 MED ORDER — CLOPIDOGREL BISULFATE 300 MG PO TABS
ORAL_TABLET | ORAL | Status: AC
  Filled 2017-03-16: qty 2

## 2017-03-16 MED ORDER — ONDANSETRON HCL 4 MG/2ML IJ SOLN: 4.0000 mg | Freq: Four times a day (QID) | INTRAMUSCULAR | Status: DC | PRN

## 2017-03-16 MED ORDER — NITROGLYCERIN 0.4 MG SL SUBL: 0.4000 mg | SUBLINGUAL_TABLET | SUBLINGUAL | Status: DC | PRN

## 2017-03-16 MED ORDER — HEPARIN SODIUM (PORCINE) 1000 UNIT/ML IJ SOLN
INTRAMUSCULAR | Status: AC
  Filled 2017-03-16: qty 1

## 2017-03-16 MED ORDER — MIDAZOLAM HCL 2 MG/2ML IJ SOLN
INTRAMUSCULAR | Status: AC
  Filled 2017-03-16: qty 2

## 2017-03-16 MED ORDER — PANTOPRAZOLE SODIUM 40 MG PO TBEC: 40.0000 mg | DELAYED_RELEASE_TABLET | Freq: Every day | ORAL | Status: DC

## 2017-03-16 MED ORDER — SODIUM CHLORIDE 0.9 % IV SOLN
INTRAVENOUS | Status: AC | PRN
Start: 2017-03-16 — End: 2017-03-16
  Administered 2017-03-16: 250 mL via INTRAVENOUS

## 2017-03-16 MED ORDER — ALBUTEROL SULFATE (2.5 MG/3ML) 0.083% IN NEBU
2.5000 mg | INHALATION_SOLUTION | RESPIRATORY_TRACT | Status: DC
  Administered 2017-03-16 – 2017-03-18 (×8): 2.5 mg via RESPIRATORY_TRACT
  Filled 2017-03-16 (×7): qty 3

## 2017-03-16 MED ORDER — CLOPIDOGREL BISULFATE 75 MG PO TABS
300.0000 mg | ORAL_TABLET | Freq: Once | ORAL | Status: AC
  Administered 2017-03-16: 300 mg via ORAL

## 2017-03-16 MED ORDER — OXYCODONE HCL 5 MG PO TABS
5.0000 mg | ORAL_TABLET | Freq: Four times a day (QID) | ORAL | Status: DC | PRN
  Administered 2017-03-16: 5 mg via ORAL
  Filled 2017-03-16: qty 1

## 2017-03-16 MED ORDER — METOPROLOL TARTRATE 5 MG/5ML IV SOLN
2.5000 mg | Freq: Once | INTRAVENOUS | Status: AC
  Administered 2017-03-16: 2.5 mg via INTRAVENOUS
  Filled 2017-03-16: qty 5

## 2017-03-16 MED ORDER — HEPARIN SODIUM (PORCINE) 1000 UNIT/ML IJ SOLN
4000.0000 [IU] | Freq: Once | INTRAMUSCULAR | Status: AC
  Administered 2017-03-16: 4000 [IU] via INTRAVENOUS

## 2017-03-16 MED ORDER — CARVEDILOL 3.125 MG PO TABS
3.1250 mg | ORAL_TABLET | Freq: Two times a day (BID) | ORAL | Status: DC
  Administered 2017-03-17 – 2017-03-18 (×3): 3.125 mg via ORAL
  Filled 2017-03-16 (×3): qty 1

## 2017-03-16 MED ORDER — SODIUM CHLORIDE 0.9 % IV SOLN: 250.0000 mL | INTRAVENOUS | Status: DC | PRN

## 2017-03-16 MED ORDER — CLOPIDOGREL BISULFATE 75 MG PO TABS
ORAL_TABLET | ORAL | Status: DC | PRN
  Administered 2017-03-16: 600 mg via ORAL

## 2017-03-16 MED ORDER — SODIUM CHLORIDE 0.9 % IV BOLUS (SEPSIS)
1000.0000 mL | Freq: Once | INTRAVENOUS | Status: AC
  Administered 2017-03-16: 1000 mL via INTRAVENOUS

## 2017-03-16 MED ORDER — MIDAZOLAM HCL 2 MG/2ML IJ SOLN
INTRAMUSCULAR | Status: DC | PRN
  Administered 2017-03-16: 1 mg via INTRAVENOUS

## 2017-03-16 MED ORDER — SODIUM CHLORIDE 0.9% FLUSH: 3.0000 mL | INTRAVENOUS | Status: DC | PRN

## 2017-03-16 MED ORDER — ASPIRIN 81 MG PO CHEW
81.0000 mg | CHEWABLE_TABLET | Freq: Every day | ORAL | Status: DC
  Administered 2017-03-16 – 2017-03-18 (×3): 81 mg via ORAL
  Filled 2017-03-16 (×3): qty 1

## 2017-03-16 MED ORDER — OXYCODONE HCL 5 MG PO TABS
5.0000 mg | ORAL_TABLET | Freq: Four times a day (QID) | ORAL | Status: DC | PRN
  Administered 2017-03-16: 5 mg via ORAL
  Administered 2017-03-17 (×2): 10 mg via ORAL
  Administered 2017-03-18: 5 mg via ORAL
  Filled 2017-03-16 (×2): qty 1
  Filled 2017-03-16: qty 2
  Filled 2017-03-16 (×2): qty 1

## 2017-03-16 MED ORDER — SODIUM CHLORIDE 0.9 % IV SOLN
INTRAVENOUS | Status: DC
  Administered 2017-03-16: 21:00:00 via INTRAVENOUS

## 2017-03-16 MED ORDER — CLOPIDOGREL BISULFATE 75 MG PO TABS
75.0000 mg | ORAL_TABLET | Freq: Every day | ORAL | Status: DC
  Administered 2017-03-17 – 2017-03-18 (×2): 75 mg via ORAL
  Filled 2017-03-16 (×2): qty 1

## 2017-03-16 MED ORDER — IOPAMIDOL (ISOVUE-300) INJECTION 61%
INTRAVENOUS | Status: DC | PRN
  Administered 2017-03-16: 105 mL via INTRA_ARTERIAL

## 2017-03-16 MED ORDER — VERAPAMIL HCL 2.5 MG/ML IV SOLN
INTRAVENOUS | Status: AC
  Filled 2017-03-16: qty 2

## 2017-03-16 SURGICAL SUPPLY — 16 items
BALLN TREK RX 3.0X12 (BALLOONS) ×3
BALLN TREK RX 3.0X20 (BALLOONS) ×3
BALLN ~~LOC~~ TREK RX 3.5X8 (BALLOONS) ×3
BALLOON TREK RX 3.0X12 (BALLOONS) ×1 IMPLANT
BALLOON TREK RX 3.0X20 (BALLOONS) ×1 IMPLANT
BALLOON ~~LOC~~ TREK RX 3.5X8 (BALLOONS) ×1 IMPLANT
CATH INFINITI 5 FR JL3.5 (CATHETERS) ×3 IMPLANT
CATH VISTA GUIDE 6FR JR4 (CATHETERS) ×3 IMPLANT
DEVICE INFLAT 30 PLUS (MISCELLANEOUS) ×3 IMPLANT
DEVICE RAD TR BAND REGULAR (VASCULAR PRODUCTS) ×3 IMPLANT
GLIDESHEATH SLEND SS 6F .021 (SHEATH) ×3 IMPLANT
KIT MANI 3VAL PERCEP (MISCELLANEOUS) ×3 IMPLANT
PACK CARDIAC CATH (CUSTOM PROCEDURE TRAY) ×3 IMPLANT
WIRE INTUITION PROPEL ST 180CM (WIRE) ×3 IMPLANT
WIRE ROSEN-J .035X260CM (WIRE) ×3 IMPLANT
WIRE RUNTHROUGH .014X180CM (WIRE) ×3 IMPLANT

## 2017-03-16 NOTE — Consult Note (Signed)
Name: Phillip Warren MRN: [id number] DOB: 06-23-1955    ADMISSION DATE:  03/16/2017 CONSULTATION DATE: 03/16/2017  REFERRING MD : Dr. Jerelyn Charles   CHIEF COMPLAINT: Chest Pain   BRIEF PATIENT DESCRIPTION:  62 yo male admitted with STEMI s/p balloon angioplasty to the RCA due to thrombosis likely due to noncompliance with dual antiplatelet therapy    SIGNIFICANT EVENTS  02/18-Pt admitted to ICU post cath   STUDIES:  Cardiac Catheterization 02/18>>Severe underlying heavily calcified three-vessel coronary artery disease with acute RCA stent thrombosis likely due to noncompliance with dual antiplatelet therapy. Moderately elevated left ventricular end-diastolic pressure. Successful balloon angioplasty to the right coronary artery.  Difficult procedure due to heavy calcifications and difficulty advancing balloons.  HISTORY OF PRESENT ILLNESS:   This is a 62 yo male with a PMH of Tobacco Abuse, Falls, Neuropathy, MI, Marijuana and Cocaine Abuse, Alcohol Abuse, Hepatitis B and C, CHF, GERD, CAD, COPD, Cirrhosis of Liver, Liver Lesion, Arthritis, and Anxiety.  He presented to [facility] ER via EMS from home 02/18 with c/o central chest pain onset of symptoms today. He was recently discharged on 03/12/17 s/p cardiac catheterization with 2 drug eluting stents placed to the RCA due to 99% occlusion. The pt has not been taking his Plavix since discharge due to an inability to afford the medication. Per ER notes during this admission EMS gave the pt 3 nitro, 4 baby aspirin, and placed the pt on 6L O2 via nasal canula due to hypoxia. He was emergently transported to the cardiac cath lab due to EKG revealing STEMI requiring balloon angioplasty to the RCA due to stent thrombosis likely due to noncompliance with dual antiplatelet therapy.  He was subsequently admitted to the ICU for further workup and treatment.    PAST MEDICAL HISTORY :   has a past medical history of Alcohol abuse, Anorexia, Anxiety, Arthritis,  Cancer ([place]), CHF (congestive heart failure) ([place]), Cirrhosis of liver ([place]), Cocaine abuse ([place]), COPD (chronic obstructive pulmonary disease) ([place]), Coronary artery disease, GERD (gastroesophageal reflux disease), H/O dizziness, Headache, Hepatitis, Marijuana use, Myocardial infarction ([place]), Neuropathy, Numbness and tingling, Pneumonia, Repeated falls, Shortness of breath dyspnea, Tinnitus, and Tobacco abuse.  has a past surgical history that includes Cardiac surgery; Colonoscopy with propofol (N/A, 08/31/2014); Esophagogastroduodenoscopy (egd) with propofol (N/A, 08/31/2014); Coronary angioplasty; Esophagogastroduodenoscopy (egd) with propofol (N/A, 01/02/2017); Coronary/Graft Acute MI Revascularization (N/A, 03/09/2017); and LEFT HEART CATH AND CORONARY ANGIOGRAPHY (N/A, 03/09/2017). Prior to Admission medications   Medication Sig Start Date End Date Taking? Authorizing Provider  aspirin 81 MG chewable tablet Chew 1 tablet (81 mg total) by mouth daily. 03/13/17   Gladstone Lighter, MD  atorvastatin (LIPITOR) 40 MG tablet Take 1 tablet (40 mg total) by mouth daily at 6 PM. 03/12/17   Gladstone Lighter, MD  carvedilol (COREG) 3.125 MG tablet Take 1 tablet (3.125 mg total) by mouth 2 (two) times daily with a meal. 03/12/17   Gladstone Lighter, MD  clopidogrel (PLAVIX) 75 MG tablet Take 1 tablet (75 mg total) by mouth daily with breakfast. 03/13/17   Gladstone Lighter, MD  docusate sodium (COLACE) 100 MG capsule Take 1 capsule (100 mg total) by mouth 2 (two) times daily as needed for mild constipation. 01/03/17   Nicholes Mango, MD  gabapentin (NEURONTIN) 300 MG capsule Take 1 capsule (300 mg total) by mouth 3 (three) times daily. 03/12/17 03/12/18  Gladstone Lighter, MD  lisinopril (PRINIVIL,ZESTRIL) 2.5 MG tablet Take 1 tablet (2.5 mg total) by mouth daily. 03/12/17   Kalisetti,  Hart Rochester, MD  nicotine (NICODERM CQ - DOSED IN MG/24 HOURS) 21 mg/24hr patch Place 1 patch (21 mg total) onto the skin daily. 03/13/17    Gladstone Lighter, MD  nitroGLYCERIN (NITROSTAT) 0.4 MG SL tablet Place 0.4 mg under the tongue every 5 (five) minutes as needed for chest pain.  05/10/13   [provider]  omeprazole (PRILOSEC) 40 MG capsule Take 1 capsule (40 mg total) by mouth 2 (two) times daily at 10 AM and 5 PM. 01/03/17   Gouru, Illene Silver, MD  oxyCODONE (ROXICODONE) 5 MG immediate release tablet Take 1 tablet (5 mg total) by mouth every 8 (eight) hours as needed. 03/12/17 03/12/18  Gladstone Lighter, MD   Allergies  Allergen Reactions  . Multihance [Gadobenate] Hives and Shortness Of Breath  . Hydrocodone Itching and Nausea And Vomiting  . Vicodin [Hydrocodone-Acetaminophen] Nausea Only and Nausea And Vomiting    Vomiting     FAMILY HISTORY:  family history includes CAD in his father; Lung cancer in his sister. SOCIAL HISTORY:  reports that he has been smoking cigarettes.  He has a 15.00 pack-year smoking history. he has never used smokeless tobacco. He reports that he drinks alcohol. He reports that he uses drugs. Drugs: Marijuana and Cocaine.  REVIEW OF SYSTEMS: Positives in BOLD  Constitutional: Negative for fever, chills, weight loss, malaise/fatigue and diaphoresis.  HENT: Negative for hearing loss, ear pain, nosebleeds, congestion, sore throat, neck pain, tinnitus and ear discharge.   Eyes: Negative for blurred vision, double vision, photophobia, pain, discharge and redness.  Respiratory: cough, hemoptysis, sputum production, shortness of breath, wheezing and stridor.   Cardiovascular: chest pain, palpitations, orthopnea, claudication, leg swelling and PND.  Gastrointestinal: Negative for heartburn, nausea, vomiting, abdominal pain, diarrhea, constipation, blood in stool and melena.  Genitourinary: Negative for dysuria, urgency, frequency, hematuria and flank pain.  Musculoskeletal: Negative for myalgias, back pain, joint pain and falls.  Skin: Negative for itching and rash.  Neurological: Negative for  dizziness, tingling, tremors, sensory change, speech change, focal weakness, seizures, loss of consciousness, weakness and headaches.  Endo/Heme/Allergies: Negative for environmental allergies and polydipsia. Does not bruise/bleed easily.  SUBJECTIVE:  Pt states he is still having chest pain   VITAL SIGNS: Pulse Rate:  [55] 55 (02/18 1902) Resp:  [16-29] 16 (02/18 1900) BP: (73-89)/(58-70) 89/70 (02/18 1902) SpO2:  [90 %-92 %] 92 % (02/18 1957) Weight:  [68 kg (150 lb)] 68 kg (150 lb) (02/18 1851)  PHYSICAL EXAMINATION: General: well developed male resting in bed  Neuro: alert and oriented, follows commands  HEENT: supple, no JVD  Cardiovascular: sinus tach, no M/R/G Lungs: faint expiratory wheezes throughout, even, non labored  Abdomen: +BS x4, soft, non tender, non distended  Musculoskeletal: normal bulk and tone, no edema  Skin: right radial TR band in place no hematoma or bleeding   Recent Labs  Lab 03/10/17 0333 03/11/17 0401 03/12/17 0407  NA 133* 133* 136  K 5.2* 3.6 3.9  CL 104 102 105  CO2 22 24 24   BUN 11 9 7   CREATININE 1.22 0.86 1.04  GLUCOSE 148* 92 162*   Recent Labs  Lab 03/10/17 0333 03/11/17 0401  HGB 13.4 11.4*  HCT 39.9* 34.5*  WBC 7.9 3.8  PLT 146* 100*   No results found.  ASSESSMENT / PLAN: STEMI s/p cardiac catheterization  Unstable Angina  Hx: Current everyday smoker, COPD, CHF, CAD, and GERD P: Supplemental O2 for dyspnea and/or hypoxia  Scheduled bronchodilator therapy  Prn CXR Trend troponin's  Continuous telemetry monitoring  Post cath EKG Continue cardiac medications per Cardiology recommendations  Continuous NS @20  ml/hr for 6 hrs post cath  Urine drug screen pending  Trend BMP Replace electrolytes as indicated  Monitor UOP Lovenox for VTE prophylaxis  Trend CBC Monitor s/sx of bleeding and transfuse for hgb <7 Prn oxycodone for pain management  Smoking cessation counseling provided  Care management consulted due to  medication needs appreciate input discussed importance of medication compliance   Marda Stalker, [employer] Pager [phone] (please enter 7 digits) PCCM Consult Pager [phone] (please enter 7 digits)

## 2017-03-16 NOTE — Consult Note (Signed)
Cardiology Consultation:   Patient ID: Phillip Warren; [id number]; 08/24/1955   Admit date: 03/16/2017 Date of Consult: 03/16/2017  Primary Care Provider: Glendon Axe, MD Primary Cardiologist: Fletcher Anon Primary Electrophysiologist:     Patient Profile:   Phillip Warren is a 62 y.o. male with a hx of liver cirrhosis with prior GI bleed and Recent inferior ST elevation myocardial infarction last week treated successfully with PCI and 2 overlapped drug-eluting stents to the right coronary artery who is being seen today for the evaluation of chest pain and abnormal EKG at the request of Dr. Archie Balboa.  History of Present Illness:   Mr. Trickey is a 62 year old male with history of prior alcohol abuse, chronic anorexia, anxiety, severe peripheral neuropathy, with previous liver cancer which was treated, liver cirrhosis, previous cocaine use, COPD with active tobacco use, hepatitis C and recent inferior myocardial infarction treated successfully with PCI and 2 overlapped drug-eluting stents to the right coronary artery.  This was done last week and the patient was discharged home late during the week.   He was discharged home on dual antiplatelet therapy including Plavix.  However, he reports that he he did not fill any of his prescriptions because he could not afford the medications even though they were generic. The patient was visiting a friend today and started having severe substernal chest pain very similar to his myocardial infarction.  His friend called EMS.  He was found to have inferior ST elevation on his EKG and thus a code STEMI was activated.  Past Medical History:  Diagnosis Date  . Alcohol abuse   . Anorexia    has trouble eating due to poor appetite  . Anxiety    trouble sleeping; lost two sisters and his mother close together  . Arthritis    hands, feet (burning, stinging)  . Cancer [facility])    liver lesion initial staging.  . CHF (congestive heart failure) ([place])   .  Cirrhosis of liver ([place])   . Cocaine abuse ([place])   . COPD (chronic obstructive pulmonary disease) ([place])   . Coronary artery disease   . GERD (gastroesophageal reflux disease)   . H/O dizziness   . Headache   . Hepatitis    B and C (chronic)  . Marijuana use   . Myocardial infarction ([place])    massive MI at age 69  . Neuropathy   . Numbness and tingling    legs and feet bilat   . Pneumonia   . Repeated falls   . Shortness of breath dyspnea   . Tinnitus   . Tobacco abuse     Past Surgical History:  Procedure Laterality Date  . CARDIAC SURGERY    . COLONOSCOPY WITH PROPOFOL N/A 08/31/2014   Procedure: COLONOSCOPY WITH PROPOFOL;  Surgeon: Josefine Class, MD;  Location: [facility] ENDOSCOPY;  Service: Endoscopy;  Laterality: N/A;  . CORONARY ANGIOPLASTY     at age 72 secondary to massive MI  . CORONARY/GRAFT ACUTE MI REVASCULARIZATION N/A 03/09/2017   Procedure: Coronary/Graft Acute MI Revascularization;  Surgeon: Wellington Hampshire, MD;  Location: [facility] CV LAB;  Service: Cardiovascular;  Laterality: N/A;  . ESOPHAGOGASTRODUODENOSCOPY (EGD) WITH PROPOFOL N/A 08/31/2014   Procedure: ESOPHAGOGASTRODUODENOSCOPY (EGD) WITH PROPOFOL;  Surgeon: Josefine Class, MD;  Location: [facility] ENDOSCOPY;  Service: Endoscopy;  Laterality: N/A;  . ESOPHAGOGASTRODUODENOSCOPY (EGD) WITH PROPOFOL N/A 01/02/2017   Procedure: ESOPHAGOGASTRODUODENOSCOPY (EGD) WITH PROPOFOL;  Surgeon: Toledo, Benay Pike, MD;  Location: [facility] ENDOSCOPY;  Service: Gastroenterology;  Laterality: N/A;  . LEFT HEART CATH AND CORONARY ANGIOGRAPHY N/A 03/09/2017   Procedure: LEFT HEART CATH AND CORONARY ANGIOGRAPHY;  Surgeon: Wellington Hampshire, MD;  Location: [place] CV LAB;  Service: Cardiovascular;  Laterality: N/A;     Home Medications:  Prior to Admission medications   Medication Sig Start Date End Date Taking? Authorizing Provider  diphenhydrAMINE (BENADRYL) 25 MG tablet Take 1 tablet (25 mg total) by mouth every 6  (six) hours as needed. 08/11/14   Carrie Mew, MD  docusate sodium (COLACE) 100 MG capsule Take 1 capsule (100 mg total) by mouth 2 (two) times daily as needed for mild constipation. 01/03/17   Nicholes Mango, MD  gabapentin (NEURONTIN) 800 MG tablet Take 800 mg by mouth 4 (four) times daily.     [provider]  LYRICA 50 MG capsule Take 1 capsule by mouth 3 (three) times daily. 10/01/16   [provider]  nitroGLYCERIN (NITROSTAT) 0.4 MG SL tablet Place 0.4 mg under the tongue every 5 (five) minutes as needed for chest pain.  05/10/13   [provider]  nortriptyline (PAMELOR) 10 MG capsule Take 1-2 capsules by mouth at bedtime. Take 1 capsule (10mg ) by mouth at every night for 1 week then increase to 2 capsules (20mg ) by mouth afterwards 11/07/16   [provider]  omeprazole (PRILOSEC) 40 MG capsule Take 1 capsule (40 mg total) by mouth 2 (two) times daily at 10 AM and 5 PM. 01/03/17   Gouru, Illene Silver, MD  propranolol (INDERAL) 10 MG tablet Take 1 tablet (10 mg total) by mouth 2 (two) times daily. 01/03/17   Nicholes Mango, MD  tamsulosin (FLOMAX) 0.4 MG CAPS capsule Take 1 capsule (0.4 mg total) by mouth daily. Patient not taking: Reported on 01/02/2017 08/19/16   Ardis Hughs, MD  traMADol (ULTRAM) 50 MG tablet Take 1 tablet by mouth 2 (two) times daily. 12/23/16   [provider]    Inpatient Medications: Scheduled Meds: . albuterol  2.5 mg Nebulization Q4H  . aspirin  81 mg Oral Daily  . [START ON 03/17/2017] atorvastatin  40 mg Oral q1800  . [START ON 03/17/2017] carvedilol  3.125 mg Oral BID WC  . [START ON 03/17/2017] clopidogrel  75 mg Oral Q breakfast  . [START ON 03/17/2017] enoxaparin (LOVENOX) injection  30 mg Subcutaneous Q24H  . gabapentin  300 mg Oral TID  . nicotine  21 mg Transdermal Daily  . pantoprazole  40 mg Oral Daily  . pantoprazole (PROTONIX) IV  40 mg Intravenous QHS  . [START ON 03/17/2017] sodium chloride flush  3 mL  Intravenous Q12H   Continuous Infusions: . sodium chloride    . [START ON 03/17/2017] sodium chloride    . sodium chloride     PRN Meds: [START ON 03/17/2017] sodium chloride, sodium chloride, acetaminophen, docusate sodium, nitroGLYCERIN, ondansetron (ZOFRAN) IV, oxyCODONE, [START ON 03/17/2017] sodium chloride flush  Allergies:    Allergies  Allergen Reactions  . Multihance [Gadobenate] Hives and Shortness Of Breath  . Hydrocodone Itching and Nausea And Vomiting  . Vicodin [Hydrocodone-Acetaminophen] Nausea Only and Nausea And Vomiting    Vomiting     Social History:   Social History   Socioeconomic History  . Marital status: Widowed    Spouse name: Not on file  . Number of children: Not on file  . Years of education: Not on file  . Highest education level: Not on file  Social Needs  . Financial resource strain: Not  on file  . Food insecurity - worry: Not on file  . Food insecurity - inability: Not on file  . Transportation needs - medical: Not on file  . Transportation needs - non-medical: Not on file  Occupational History  . Not on file  Tobacco Use  . Smoking status: Current Every Day Smoker    Packs/day: 0.50    Years: 30.00    Pack years: 15.00    Types: Cigarettes  . Smokeless tobacco: Never Used  Substance and Sexual Activity  . Alcohol use: Yes    Comment: Daily  . Drug use: Yes    Types: Marijuana, Cocaine    Comment: pt states has not used cocaine in 20 years   . Sexual activity: Not on file  Other Topics Concern  . Not on file  Social History Narrative  . Not on file    Family History:    Family History  Problem Relation Age of Onset  . CAD Father   . Lung cancer Sister   . Prostate cancer Neg Hx   . Bladder Cancer Neg Hx   . Kidney cancer Neg Hx      ROS:  Please see the history of present illness.   All other ROS reviewed and negative.     Physical Exam/Data:   Vitals:   03/16/17 1900 03/16/17 1902 03/16/17 1920 03/16/17 1957    BP: (!) 88/64 (!) 89/70    Pulse: (!) 55 (!) 55    Resp: 16     SpO2: 92% 90% 90% 94%  Weight:      Height:       No intake or output data in the 24 hours ending 03/16/17 2030 Filed Weights   03/16/17 1851  Weight: 150 lb (68 kg)   Body mass index is 20.34 kg/m.  General:  Well nourished, well developed, in severe distress due to pain. HEENT: normal Lymph: no adenopathy Neck: no JVD Endocrine:  No thryomegaly Vascular: No carotid bruits; FA pulses 2+ bilaterally without bruits  Cardiac:  normal S1, S2; RRR; no murmur  Lungs:  clear to auscultation bilaterally, no wheezing, rhonchi or rales  Abd: soft, nontender, no hepatomegaly  Ext: no edema Musculoskeletal:  No deformities, BUE and BLE strength normal and equal Skin: warm and dry  Neuro:  CNs 2-12 intact, no focal abnormalities noted Psych:  Normal affect   EKG:  The EKG was personally reviewed and demonstrates: Normal sinus rhythm with 3-4 mm of inferior ST elevation with reciprocal changes in the anterior leads   Relevant CV Studies: Laboratory Data:  Chemistry Recent Labs  Lab 03/10/17 0333 03/11/17 0401 03/12/17 0407  NA 133* 133* 136  K 5.2* 3.6 3.9  CL 104 102 105  CO2 22 24 24   GLUCOSE 148* 92 162*  BUN 11 9 7   CREATININE 1.22 0.86 1.04  CALCIUM 8.1* 8.1* 8.1*  GFRNONAA >60 >60 >60  GFRAA >60 >60 >60  ANIONGAP 7 7 7     No results for input(s): PROT, ALBUMIN, AST, ALT, ALKPHOS, BILITOT in the last 168 hours. Hematology Recent Labs  Lab 03/10/17 0333 03/11/17 0401  WBC 7.9 3.8  RBC 4.49 3.87*  HGB 13.4 11.4*  HCT 39.9* 34.5*  MCV 88.8 89.0  MCH 29.8 29.3  MCHC 33.5 32.9  RDW 15.0* 14.9*  PLT 146* 100*   Cardiac Enzymes Recent Labs  Lab 03/10/17 0206 03/10/17 0658 03/11/17 0401  TROPONINI 38.89* >65.00* 28.03*   No results for input(s): TROPIPOC in  the last 168 hours.  BNPNo results for input(s): BNP, PROBNP in the last 168 hours.  DDimer No results for input(s): DDIMER in the last  168 hours.  Radiology/Studies:  No results found.  Assessment and Plan:   1. Acute inferior ST elevation myocardial infarction: This is due to acute stent thrombosis due to noncompliance with dual antiplatelet therapy.  I proceeded with emergent cardiac catheterization again which showed occluded mid to distal right coronary artery due to stent thrombosis.  This was treated successfully with balloon angioplasty.  I used noncompliant balloon to high pressure at the end with very good results.  No additional stenting was performed.  I reloaded him with clopidogrel.  Continue dual antiplatelet therapy for at least one year.  The patient has severe residual disease affecting the LAD and left circumflex but I doubt that he would be a candidate for further revascularization without improved compliance.   2. History of liver cirrhosis with previous GI bleed: Monitor for any signs of bleeding given the use of antiplatelet and antithrombotic therapy. 3. Tobacco use: Smoking cessation was again discussed with the patient . 4.  Hyperlipidemia: Continue treatment with atorvastatin.  For questions or updates, please contact [place] Please consult [email or web address] for contact info under Cardiology/STEMI.   Signed, Kathlyn Sacramento, MD  03/16/2017 8:30 PM

## 2017-03-16 NOTE — ED Notes (Signed)
Pt placed on non-rebreather at this time  

## 2017-03-16 NOTE — ED Notes (Signed)
Pt being taken to Cath lab at this time with Laury Axon EDT and MD Fletcher Anon

## 2017-03-16 NOTE — ED Notes (Signed)
Pt placed on nasal cannula at 4L

## 2017-03-16 NOTE — ED Provider Notes (Signed)
[facility] Emergency Department Provider Note    ____________________________________________   I have reviewed the triage vital signs and the nursing notes.   HISTORY  Chief Complaint Chest Pain   History limited by: Not Limited   HPI Phillip Warren is a 62 y.o. male who presents to the emergency department today via EMS as code STEMI. Patient states that chest pain started today. Was sudden. Was severe. Located centrally. Associated with diaphoresis and shortness of breath. Took 4 baby aspirin and was given nitroglycerin by first responders. Blood pressure initially was systolic in the 588F however after nitro went down to the 80s.    Per medical record review patient has a history of CAD, stented one week ago.  Past Medical History:  Diagnosis Date  . Alcohol abuse   . Anorexia    has trouble eating due to poor appetite  . Anxiety    trouble sleeping; lost two sisters and his mother close together  . Arthritis    hands, feet (burning, stinging)  . Cancer [facility])    liver lesion initial staging.  . CHF (congestive heart failure) ([place])   . Cirrhosis of liver ([place])   . Cocaine abuse ([place])   . COPD (chronic obstructive pulmonary disease) ([place])   . Coronary artery disease   . GERD (gastroesophageal reflux disease)   . H/O dizziness   . Headache   . Hepatitis    B and C (chronic)  . Marijuana use   . Myocardial infarction ([place])    massive MI at age 16  . Neuropathy   . Numbness and tingling    legs and feet bilat   . Pneumonia   . Repeated falls   . Shortness of breath dyspnea   . Tinnitus   . Tobacco abuse     Patient Active Problem List   Diagnosis Date Noted  . COPD (chronic obstructive pulmonary disease) ([place]) 03/09/2017  . GERD (gastroesophageal reflux disease) 03/09/2017  . Alcohol abuse 03/09/2017  . History of GI bleed 03/09/2017  . Acute ST elevation myocardial infarction (STEMI) of inferior wall ([place]) 03/09/2017  . ST  elevation myocardial infarction (STEMI) ([place])   . GI bleed 01/02/2017  . Hepatic cirrhosis ([place])   . Hepatocellular carcinoma ([place])   . [place] (hepatocellular carcinoma) ([place])   . Hepatitis, viral   . Cancer, hepatocellular ([place]) 08/31/2014  . Absolute anemia 08/26/2014  . Cirrhosis ([place]) 08/26/2014  . Arteriosclerosis of coronary artery 08/26/2014  . HBV (hepatitis B virus) infection 08/26/2014  . HCV (hepatitis C virus) 08/26/2014  . Chronic hepatitis C ([place]) 04/13/2014  . Foot pain 04/13/2014    Past Surgical History:  Procedure Laterality Date  . CARDIAC SURGERY    . COLONOSCOPY WITH PROPOFOL N/A 08/31/2014   Procedure: COLONOSCOPY WITH PROPOFOL;  Surgeon: Josefine Class, MD;  Location: [facility] ENDOSCOPY;  Service: Endoscopy;  Laterality: N/A;  . CORONARY ANGIOPLASTY     at age 58 secondary to massive MI  . CORONARY/GRAFT ACUTE MI REVASCULARIZATION N/A 03/09/2017   Procedure: Coronary/Graft Acute MI Revascularization;  Surgeon: Wellington Hampshire, MD;  Location: [place] CV LAB;  Service: Cardiovascular;  Laterality: N/A;  . ESOPHAGOGASTRODUODENOSCOPY (EGD) WITH PROPOFOL N/A 08/31/2014   Procedure: ESOPHAGOGASTRODUODENOSCOPY (EGD) WITH PROPOFOL;  Surgeon: Josefine Class, MD;  Location: [facility] ENDOSCOPY;  Service: Endoscopy;  Laterality: N/A;  . ESOPHAGOGASTRODUODENOSCOPY (EGD) WITH PROPOFOL N/A 01/02/2017   Procedure: ESOPHAGOGASTRODUODENOSCOPY (EGD) WITH PROPOFOL;  Surgeon: Alice Reichert, Benay Pike, MD;  Location:  [facility] ENDOSCOPY;  Service: Gastroenterology;  Laterality: N/A;  . LEFT HEART CATH AND CORONARY ANGIOGRAPHY N/A 03/09/2017   Procedure: LEFT HEART CATH AND CORONARY ANGIOGRAPHY;  Surgeon: Wellington Hampshire, MD;  Location: [place] CV LAB;  Service: Cardiovascular;  Laterality: N/A;    Prior to Admission medications   Medication Sig Start Date End Date Taking? Authorizing Provider  aspirin 81 MG chewable tablet Chew 1 tablet (81 mg total) by mouth daily. 03/13/17    Gladstone Lighter, MD  atorvastatin (LIPITOR) 40 MG tablet Take 1 tablet (40 mg total) by mouth daily at 6 PM. 03/12/17   Gladstone Lighter, MD  carvedilol (COREG) 3.125 MG tablet Take 1 tablet (3.125 mg total) by mouth 2 (two) times daily with a meal. 03/12/17   Gladstone Lighter, MD  clopidogrel (PLAVIX) 75 MG tablet Take 1 tablet (75 mg total) by mouth daily with breakfast. 03/13/17   Gladstone Lighter, MD  docusate sodium (COLACE) 100 MG capsule Take 1 capsule (100 mg total) by mouth 2 (two) times daily as needed for mild constipation. 01/03/17   Nicholes Mango, MD  gabapentin (NEURONTIN) 300 MG capsule Take 1 capsule (300 mg total) by mouth 3 (three) times daily. 03/12/17 03/12/18  Gladstone Lighter, MD  lisinopril (PRINIVIL,ZESTRIL) 2.5 MG tablet Take 1 tablet (2.5 mg total) by mouth daily. 03/12/17   Gladstone Lighter, MD  nicotine (NICODERM CQ - DOSED IN MG/24 HOURS) 21 mg/24hr patch Place 1 patch (21 mg total) onto the skin daily. 03/13/17   Gladstone Lighter, MD  nitroGLYCERIN (NITROSTAT) 0.4 MG SL tablet Place 0.4 mg under the tongue every 5 (five) minutes as needed for chest pain.  05/10/13   [provider]  omeprazole (PRILOSEC) 40 MG capsule Take 1 capsule (40 mg total) by mouth 2 (two) times daily at 10 AM and 5 PM. 01/03/17   Gouru, Illene Silver, MD  oxyCODONE (ROXICODONE) 5 MG immediate release tablet Take 1 tablet (5 mg total) by mouth every 8 (eight) hours as needed. 03/12/17 03/12/18  Gladstone Lighter, MD    Allergies Multihance [gadobenate]; Hydrocodone; and Vicodin [hydrocodone-acetaminophen]  Family History  Problem Relation Age of Onset  . CAD Father   . Lung cancer Sister   . Prostate cancer Neg Hx   . Bladder Cancer Neg Hx   . Kidney cancer Neg Hx     Social History Social History   Tobacco Use  . Smoking status: Current Every Day Smoker    Packs/day: 0.50    Years: 30.00    Pack years: 15.00    Types: Cigarettes  . Smokeless tobacco: Never Used  Substance  Use Topics  . Alcohol use: Yes    Comment: Daily  . Drug use: Yes    Types: Marijuana, Cocaine    Comment: pt states has not used cocaine in 20 years     Review of Systems Constitutional: No fever/chills Cardiovascular: Positive for chest pain. Respiratory: Positive for shortness of breath. Gastrointestinal: No abdominal pain.  No nausea, no vomiting.  No diarrhea.   Genitourinary: Negative for dysuria. Musculoskeletal: Negative for back pain. Skin: Negative for rash. Neurological: Negative for headaches, focal weakness or numbness.  ____________________________________________   PHYSICAL EXAM:  VITAL SIGNS: ED Triage Vitals  Enc Vitals Group     BP 03/16/17 1850 (!) 73/58     Pulse Rate 03/16/17 1900 (!) 55     Resp 03/16/17 1850 (!) 29     Temp --      Temp src --  SpO2 03/16/17 1900 92 %     Weight 03/16/17 1851 150 lb (68 kg)     Height 03/16/17 1851 6' (1.829 m)     Head Circumference --      Peak Flow --      Pain Score 03/16/17 1850 10    Constitutional: Alert and oriented. Appears uncomfortable. Eyes: Conjunctivae are normal.  ENT   Head: Normocephalic and atraumatic.   Nose: No congestion/rhinnorhea.   Mouth/Throat: Mucous membranes are moist.   Neck: No stridor. Hematological/Lymphatic/Immunilogical: No cervical lymphadenopathy. Cardiovascular: Normal rate, regular rhythm.  No murmurs, rubs, or gallops.  Respiratory: Normal respiratory effort without tachypnea nor retractions. Breath sounds are clear and equal bilaterally. No wheezes/rales/rhonchi. Gastrointestinal: Soft and non tender. No rebound. No guarding.  Genitourinary: Deferred Musculoskeletal: Normal range of motion in all extremities. No lower extremity edema. Neurologic:  Normal speech and language. No gross focal neurologic deficits are appreciated.  Skin:  Skin is warm, dry and intact. No rash noted. Psychiatric: Mood and affect are normal. Speech and behavior are normal.  Patient exhibits appropriate insight and judgment.  ____________________________________________    LABS (pertinent positives/negatives)  Labs pending at time of transfer to cath lab  ____________________________________________   EKG  EKG consistent with inferior stemi. ST elevation in II, II, aVF.  ____________________________________________    RADIOLOGY  None  _________________________________________   PROCEDURES  Procedures  CRITICAL CARE Performed by: Nance Pear   Total critical care time: 25 minutes  Critical care time was exclusive of separately billable procedures and treating other patients.  Critical care was necessary to treat or prevent imminent or life-threatening deterioration.  Critical care was time spent personally by me on the following activities: development of treatment plan with patient and/or surrogate as well as nursing, discussions with consultants, evaluation of patient's response to treatment, examination of patient, obtaining history from patient or surrogate, ordering and performing treatments and interventions, ordering and review of laboratory studies, ordering and review of radiographic studies, pulse oximetry and re-evaluation of patient's condition.  ____________________________________________   INITIAL IMPRESSION / ASSESSMENT AND PLAN / ED COURSE  Pertinent labs & imaging results that were available during my care of the patient were reviewed by me and considered in my medical decision making (see chart for details).  Presented to the emergency department today via EMS as emergency traffic and STEMI.  Patient's EKG was consistent with an inferior ST elevation MI.  Dr. Velva Harman with cardiology was at bedside during my evaluation.  Patient was given IV fluids and Plavix as well as heparin here in the emergency department.  He already took aspirin.  Patient's blood pressure did seem to respond to the IV fluids.  Patient was  transported to the catheterization lab.  ____________________________________________   FINAL CLINICAL IMPRESSION(S) / ED DIAGNOSES  Final diagnoses:  ST elevation myocardial infarction (STEMI), unspecified artery ([place])  Chest pain, unspecified type     Note: This dictation was prepared with Dragon dictation. Any transcriptional errors that result from this process are unintentional     Nance Pear, MD 03/16/17 2337

## 2017-03-16 NOTE — ED Notes (Signed)
Rechecked temp orally 100.1, MD notified

## 2017-03-16 NOTE — ED Triage Notes (Signed)
Pt comes from home via [facility] with c/o central chest pain that started hour ago. Pt had stents placed week ago. Per EMS pt was given 3 nitro, and took 4 baby aspirin. BP. 85/40, HR-59, 93% 6 L nasal cannula. BS-242, ET between 7-9. Pt received 200 cc fluids.

## 2017-03-16 NOTE — H&P (Addendum)
[place] [facility] [place] NAME: Phillip Warren    MR#:  [id number]  DATE OF BIRTH:  1955/10/01  DATE OF ADMISSION:  03/16/2017  PRIMARY CARE PHYSICIAN: Glendon Axe, MD   REQUESTING/REFERRING PHYSICIAN:   CHIEF COMPLAINT:   Chief Complaint  Patient presents with  . Chest Pain    HISTORY OF PRESENT ILLNESS: Phillip Warren  is a 62 y.o. male with a known history per below, status post recent STEMI requiring 2 stent placement by cardiology on March 09, 2017, subsequently discharged to home, patient noncompliant with medical management, presented back to the emergency room with acute excruciating chest pain, found to have acute ST segment elevation MI, taken to the Cath Lab for emergent catheterization, patient was found to have stent occlusion which was treated by cardiology, hospitalist called for admission.  PAST MEDICAL HISTORY:   Past Medical History:  Diagnosis Date  . Alcohol abuse   . Anorexia    has trouble eating due to poor appetite  . Anxiety    trouble sleeping; lost two sisters and his mother close together  . Arthritis    hands, feet (burning, stinging)  . Cancer [facility])    liver lesion initial staging.  . CHF (congestive heart failure) ([place])   . Cirrhosis of liver ([place])   . Cocaine abuse ([place])   . COPD (chronic obstructive pulmonary disease) ([place])   . Coronary artery disease   . GERD (gastroesophageal reflux disease)   . H/O dizziness   . Headache   . Hepatitis    B and C (chronic)  . Marijuana use   . Myocardial infarction ([place])    massive MI at age 89  . Neuropathy   . Numbness and tingling    legs and feet bilat   . Pneumonia   . Repeated falls   . Shortness of breath dyspnea   . Tinnitus   . Tobacco abuse     PAST SURGICAL HISTORY:  Past Surgical History:  Procedure Laterality Date  . CARDIAC SURGERY    . COLONOSCOPY WITH PROPOFOL N/A 08/31/2014   Procedure: COLONOSCOPY WITH PROPOFOL;  Surgeon: Josefine Class, MD;  Location: [facility] ENDOSCOPY;  Service: Endoscopy;  Laterality: N/A;  . CORONARY ANGIOPLASTY     at age 54 secondary to massive MI  . CORONARY/GRAFT ACUTE MI REVASCULARIZATION N/A 03/09/2017   Procedure: Coronary/Graft Acute MI Revascularization;  Surgeon: Wellington Hampshire, MD;  Location: [place] CV LAB;  Service: Cardiovascular;  Laterality: N/A;  . ESOPHAGOGASTRODUODENOSCOPY (EGD) WITH PROPOFOL N/A 08/31/2014   Procedure: ESOPHAGOGASTRODUODENOSCOPY (EGD) WITH PROPOFOL;  Surgeon: Josefine Class, MD;  Location: [facility] ENDOSCOPY;  Service: Endoscopy;  Laterality: N/A;  . ESOPHAGOGASTRODUODENOSCOPY (EGD) WITH PROPOFOL N/A 01/02/2017   Procedure: ESOPHAGOGASTRODUODENOSCOPY (EGD) WITH PROPOFOL;  Surgeon: [place], Benay Pike, MD;  Location: [facility] ENDOSCOPY;  Service: Gastroenterology;  Laterality: N/A;  . LEFT HEART CATH AND CORONARY ANGIOGRAPHY N/A 03/09/2017   Procedure: LEFT HEART CATH AND CORONARY ANGIOGRAPHY;  Surgeon: Wellington Hampshire, MD;  Location: [place] CV LAB;  Service: Cardiovascular;  Laterality: N/A;    SOCIAL HISTORY:  Social History   Tobacco Use  . Smoking status: Current Every Day Smoker    Packs/day: 0.50    Years: 30.00    Pack years: 15.00    Types: Cigarettes  . Smokeless tobacco: Never Used  Substance Use Topics  . Alcohol use: Yes    Comment: Daily    FAMILY HISTORY:  Family  History  Problem Relation Age of Onset  . CAD Father   . Lung cancer Sister   . Prostate cancer Neg Hx   . Bladder Cancer Neg Hx   . Kidney cancer Neg Hx     DRUG ALLERGIES:  Allergies  Allergen Reactions  . Multihance [Gadobenate] Hives and Shortness Of Breath  . Hydrocodone Itching and Nausea And Vomiting  . Vicodin [Hydrocodone-Acetaminophen] Nausea Only and Nausea And Vomiting    Vomiting     REVIEW OF SYSTEMS:   CONSTITUTIONAL: No fever, fatigue or weakness.  EYES: No blurred or double vision.  EARS, NOSE, AND THROAT: No tinnitus or ear pain.   RESPIRATORY: No cough,+ shortness of breath,no wheezing or hemoptysis.  CARDIOVASCULAR: + chest pain, no orthopnea, edema.  GASTROINTESTINAL: No nausea, vomiting, diarrhea or abdominal pain.  GENITOURINARY: No dysuria, hematuria.  ENDOCRINE: No polyuria, nocturia,  HEMATOLOGY: No anemia, easy bruising or bleeding SKIN: No rash or lesion. MUSCULOSKELETAL: No joint pain or arthritis.   NEUROLOGIC: No tingling, numbness, weakness.  PSYCHIATRY: No anxiety or depression.   MEDICATIONS AT HOME:  Prior to Admission medications   Medication Sig Start Date End Date Taking? Authorizing Provider  aspirin 81 MG chewable tablet Chew 1 tablet (81 mg total) by mouth daily. 03/13/17   Gladstone Lighter, MD  atorvastatin (LIPITOR) 40 MG tablet Take 1 tablet (40 mg total) by mouth daily at 6 PM. 03/12/17   Gladstone Lighter, MD  carvedilol (COREG) 3.125 MG tablet Take 1 tablet (3.125 mg total) by mouth 2 (two) times daily with a meal. 03/12/17   Gladstone Lighter, MD  clopidogrel (PLAVIX) 75 MG tablet Take 1 tablet (75 mg total) by mouth daily with breakfast. 03/13/17   Gladstone Lighter, MD  docusate sodium (COLACE) 100 MG capsule Take 1 capsule (100 mg total) by mouth 2 (two) times daily as needed for mild constipation. 01/03/17   Nicholes Mango, MD  gabapentin (NEURONTIN) 300 MG capsule Take 1 capsule (300 mg total) by mouth 3 (three) times daily. 03/12/17 03/12/18  Gladstone Lighter, MD  lisinopril (PRINIVIL,ZESTRIL) 2.5 MG tablet Take 1 tablet (2.5 mg total) by mouth daily. 03/12/17   Gladstone Lighter, MD  nicotine (NICODERM CQ - DOSED IN MG/24 HOURS) 21 mg/24hr patch Place 1 patch (21 mg total) onto the skin daily. 03/13/17   Gladstone Lighter, MD  nitroGLYCERIN (NITROSTAT) 0.4 MG SL tablet Place 0.4 mg under the tongue every 5 (five) minutes as needed for chest pain.  05/10/13   [provider]  omeprazole (PRILOSEC) 40 MG capsule Take 1 capsule (40 mg total) by mouth 2 (two) times daily at 10 AM  and 5 PM. 01/03/17   Gouru, Illene Silver, MD  oxyCODONE (ROXICODONE) 5 MG immediate release tablet Take 1 tablet (5 mg total) by mouth every 8 (eight) hours as needed. 03/12/17 03/12/18  Gladstone Lighter, MD      PHYSICAL EXAMINATION:   VITAL SIGNS: Blood pressure (!) 89/70, pulse (!) 55, resp. rate 16, height 6' (1.829 m), weight 68 kg (150 lb), SpO2 92 %.  GENERAL:  62 y.o.-year-old patient lying in the bed with no acute distress.  Malnourished appearance EYES: Pupils equal, round, reactive to light and accommodation. No scleral icterus. Extraocular muscles intact.  HEENT: Head atraumatic, normocephalic. Oropharynx and nasopharynx clear.  NECK:  Supple, no jugular venous distention. No thyroid enlargement, no tenderness.  LUNGS: Normal breath sounds bilaterally, no wheezing, rales,rhonchi or crepitation. No use of accessory muscles of respiration.  CARDIOVASCULAR: S1, S2 normal. No  murmurs, rubs, or gallops.  ABDOMEN: Soft, nontender, nondistended. Bowel sounds present. No organomegaly or mass.  EXTREMITIES: No pedal edema, cyanosis, or clubbing.  NEUROLOGIC: Cranial nerves II through XII are intact. Muscle strength 5/5 in all extremities. Sensation intact. Gait not checked.  PSYCHIATRIC: The patient is alert and oriented x 3.  SKIN: No obvious rash, lesion, or ulcer.   LABORATORY PANEL:   CBC Recent Labs  Lab 03/10/17 0333 03/11/17 0401  WBC 7.9 3.8  HGB 13.4 11.4*  HCT 39.9* 34.5*  PLT 146* 100*  MCV 88.8 89.0  MCH 29.8 29.3  MCHC 33.5 32.9  RDW 15.0* 14.9*  LYMPHSABS 1.3  --   MONOABS 0.5  --   EOSABS 0.2  --   BASOSABS 0.0  --    ------------------------------------------------------------------------------------------------------------------  Chemistries  Recent Labs  Lab 03/10/17 0333 03/11/17 0401 03/12/17 0407  NA 133* 133* 136  K 5.2* 3.6 3.9  CL 104 102 105  CO2 22 24 24   GLUCOSE 148* 92 162*  BUN 11 9 7   CREATININE 1.22 0.86 1.04  CALCIUM 8.1* 8.1* 8.1*    ------------------------------------------------------------------------------------------------------------------ estimated creatinine clearance is 73.6 mL/min (by C-G formula based on SCr of 1.04 mg/dL). ------------------------------------------------------------------------------------------------------------------ No results for input(s): TSH, T4TOTAL, T3FREE, THYROIDAB in the last 72 hours.  Invalid input(s): FREET3   Coagulation profile No results for input(s): INR, PROTIME in the last 168 hours. ------------------------------------------------------------------------------------------------------------------- No results for input(s): DDIMER in the last 72 hours. -------------------------------------------------------------------------------------------------------------------  Cardiac Enzymes Recent Labs  Lab 03/10/17 0206 03/10/17 0658 03/11/17 0401  TROPONINI 38.89* >65.00* 28.03*   ------------------------------------------------------------------------------------------------------------------ Invalid input(s): POCBNP  ---------------------------------------------------------------------------------------------------------------  Urinalysis    Component Value Date/Time   COLORURINE Yellow 05/14/2014 1606   APPEARANCEUR Clear 08/19/2016 0949   LABSPEC 1.019 05/14/2014 1606   PHURINE 6.0 05/14/2014 1606   GLUCOSEU Negative 08/19/2016 0949   GLUCOSEU Negative 05/14/2014 1606   HGBUR Negative 05/14/2014 1606   BILIRUBINUR Negative 08/19/2016 0949   BILIRUBINUR Negative 05/14/2014 1606   KETONESUR Negative 05/14/2014 1606   PROTEINUR Negative 08/19/2016 0949   PROTEINUR Negative 05/14/2014 1606   NITRITE Negative 08/19/2016 0949   NITRITE Negative 05/14/2014 1606   LEUKOCYTESUR Negative 08/19/2016 0949   LEUKOCYTESUR Negative 05/14/2014 1606     RADIOLOGY: No results found.  EKG: Orders placed or performed during the hospital encounter of 03/09/17  .  EKG 12-Lead  . EKG 12-Lead  . EKG 12-Lead immediately post procedure  . EKG 12-Lead  . EKG 12-Lead immediately post procedure  . EKG 12-Lead  . 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 Management plans discussed with the patient, family and they are in agreement.  CODE STATUS:full Code Status History    Date Active Date Inactive Code Status Order ID Comments User Context   03/09/2017 21:03 03/12/2017 17:25 Full Code [id number]  Wellington Hampshire, MD Inpatient   01/02/2017  14:10 01/03/2017 13:30 Full Code [id number]  Vaughan Basta, MD Inpatient    Advance Directive Documentation     Most Recent Value  Type of Advance Directive  Healthcare Power of Attorney  Pre-existing out of facility DNR order (yellow form or pink MOST form)  No data  "MOST" Form in Place?  No data       TOTAL TIME TAKING CARE OF THIS PATIENT: 45 minutes of critical care time was used to discuss plan of care with subspecialty staff, ancillary staff, the patient with all questions answered.    Avel Peace Tykera Skates M.D on 03/16/2017   Between 7am to 6pm - Pager - [phone]  After 6pm go to [email or web address] - password [email or web address] [place] [facility]  Office  [phone]  CC: Primary care physician; Glendon Axe, MD   Note: This dictation was prepared with Dragon dictation  along with smaller phrase technology. Any transcriptional errors that result from this process are unintentional.

## 2017-03-17 ENCOUNTER — Inpatient Hospital Stay (HOSPITAL_COMMUNITY)
Admit: 2017-03-17 | Discharge: 2017-03-17 | Disposition: A | Discharge status: Home / Self Care | Payer: Medicare Other | Attending: Physician Assistant | Admitting: Physician Assistant

## 2017-03-17 ENCOUNTER — Encounter: Payer: Self-pay | Admitting: Cardiovascular Disease

## 2017-03-17 DIAGNOSIS — F1910 Other psychoactive substance abuse, uncomplicated: Secondary | ICD-10-CM

## 2017-03-17 DIAGNOSIS — I238 Other current complications following acute myocardial infarction: Secondary | ICD-10-CM

## 2017-03-17 DIAGNOSIS — I340 Nonrheumatic mitral (valve) insufficiency: Secondary | ICD-10-CM

## 2017-03-17 DIAGNOSIS — Z955 Presence of coronary angioplasty implant and graft: Secondary | ICD-10-CM

## 2017-03-17 DIAGNOSIS — I319 Disease of pericardium, unspecified: Secondary | ICD-10-CM

## 2017-03-17 LAB — BASIC METABOLIC PANEL
Anion gap: 9 (ref 5–15)
BUN: 11 mg/dL (ref 6–20)
CHLORIDE: 104 mmol/L (ref 101–111)
CO2: 19 mmol/L — ABNORMAL LOW (ref 22–32)
CREATININE: 1.04 mg/dL (ref 0.61–1.24)
Calcium: 7.9 mg/dL — ABNORMAL LOW (ref 8.9–10.3)
GFR calc Af Amer: >60 mL/min (ref >60)
GFR calc non Af Amer: >60 mL/min (ref >60)
GLUCOSE: 169 mg/dL — AB (ref 65–99)
POTASSIUM: 3.7 mmol/L (ref 3.5–5.1)
SODIUM: 132 mmol/L — AB (ref 135–145)

## 2017-03-17 LAB — TROPONIN I
TROPONIN I: 7.7 ng/mL — AB (ref <0.03)
Troponin I: 8.98 ng/mL (ref <0.03)

## 2017-03-17 LAB — CBC
HCT: 34.8 % — ABNORMAL LOW (ref 40.0–52.0)
HEMOGLOBIN: 11.5 g/dL — AB (ref 13.0–18.0)
MCH: 29.5 pg (ref 26.0–34.0)
MCHC: 33.1 g/dL (ref 32.0–36.0)
MCV: 89.1 fL (ref 80.0–100.0)
PLATELETS: 136 10*3/uL — AB (ref 150–440)
RBC: 3.91 MIL/uL — AB (ref 4.40–5.90)
RDW: 15.2 % — ABNORMAL HIGH (ref 11.5–14.5)
WBC: 6.8 10*3/uL (ref 3.8–10.6)

## 2017-03-17 LAB — URINE DRUG SCREEN, QUALITATIVE (ARMC ONLY)
Amphetamines, Ur Screen: NOT DETECTED
BARBITURATES, UR SCREEN: NOT DETECTED
Benzodiazepine, Ur Scrn: POSITIVE — AB
CANNABINOID 50 NG, UR ~~LOC~~: POSITIVE — AB
COCAINE METABOLITE, UR ~~LOC~~: NOT DETECTED
MDMA (ECSTASY) UR SCREEN: NOT DETECTED
Methadone Scn, Ur: NOT DETECTED
Opiate, Ur Screen: NOT DETECTED
Phencyclidine (PCP) Ur S: NOT DETECTED
TRICYCLIC, UR SCREEN: NOT DETECTED

## 2017-03-17 LAB — ECHOCARDIOGRAM LIMITED
HEIGHTINCHES: 72 in
WEIGHTICAEL: 2366.86 [oz_av]

## 2017-03-17 MED ORDER — PANTOPRAZOLE SODIUM 40 MG PO TBEC
40.0000 mg | DELAYED_RELEASE_TABLET | Freq: Every day | ORAL | Status: DC
  Administered 2017-03-17 – 2017-03-18 (×2): 40 mg via ORAL
  Filled 2017-03-17 (×2): qty 1

## 2017-03-17 MED ORDER — LORAZEPAM 2 MG/ML IJ SOLN
0.5000 mg | INTRAMUSCULAR | Status: DC | PRN
  Administered 2017-03-17: 1 mg via INTRAVENOUS
  Filled 2017-03-17: qty 1

## 2017-03-17 MED ORDER — COLCHICINE 0.6 MG PO TABS
0.6000 mg | ORAL_TABLET | Freq: Once | ORAL | Status: AC
  Administered 2017-03-17: 0.6 mg via ORAL
  Filled 2017-03-17: qty 1

## 2017-03-17 MED ORDER — METOCLOPRAMIDE HCL 5 MG/ML IJ SOLN
5.0000 mg | Freq: Four times a day (QID) | INTRAMUSCULAR | Status: DC | PRN
  Administered 2017-03-17 – 2017-03-18 (×2): 5 mg via INTRAVENOUS
  Filled 2017-03-17 (×2): qty 2

## 2017-03-17 MED ORDER — POTASSIUM CHLORIDE CRYS ER 20 MEQ PO TBCR
40.0000 meq | EXTENDED_RELEASE_TABLET | Freq: Two times a day (BID) | ORAL | Status: AC
  Administered 2017-03-17 – 2017-03-18 (×3): 40 meq via ORAL
  Filled 2017-03-17 (×3): qty 2

## 2017-03-17 NOTE — Progress Notes (Signed)
Inpatient Diabetes Program Recommendations  AACE/ADA: New Consensus Statement on Inpatient Glycemic Control (2015)  Target Ranges:  Prepandial:   less than 140 mg/dL      Peak postprandial:   less than 180 mg/dL (1-2 hours)      Critically ill patients:  140 - 180 mg/dL   Results for Phillip Warren, Phillip Warren (MRN [id number]) as of 03/17/2017 10:45  Ref. Range 03/16/2017 18:52 03/17/2017 08:24  Glucose Latest Ref Range: 65 - 99 mg/dL 209 (H) 169 (H)   Review of Glycemic Control  Diabetes history: No Outpatient Diabetes medications: NA Current orders for Inpatient glycemic control: None  Inpatient Diabetes Program Recommendations: HgbA1C: Please consider ordering an A1C to evaluate glycemic control over the past 2-3 months.    Thanks, Barnie Alderman, RN, MSN, CDE Diabetes Coordinator Inpatient Diabetes Program [phone] (Team Pager from 8am to 5pm)

## 2017-03-17 NOTE — Care Management (Signed)
RNCM met with patient regarding medication assistance. He has drug coverage but "doesn't have money to pay for medications".  He states he is able to buy cigarettes and having a really hard time quitting. He voices understanding that he needs to stop smoking and spending money on items impacting his health.  Currently RNCM cannot provide assistance as he has health care coverage.  

## 2017-03-17 NOTE — Progress Notes (Signed)
Pt moving to room 258. Pt is alert and oriented.  Pt complained of nausea and reglan was given as ordered.  Right radial site dressing remains clean, dry and intact.  No complaints of pain at this time.

## 2017-03-17 NOTE — Progress Notes (Signed)
Interesting day. Patient contradicts all his statements each time I visit his room.For example-Patient states he takes no pain medicine at home but his doctor is going to send him to a pain management doctor??? He doesn't know wherther his chest or his abdomen hurts but his legs always hurt. He complains constantly of nausea but never retches or vomits anything. He refuses to eat will only drink a few fluids but then complains that he is hungry. Never tells the same story about himself or his past medical issues. Confused about home medication doses or what medications he takes at home. Voiding without issues is his only normal quality. Troponin is 27-MD aware.

## 2017-03-17 NOTE — Progress Notes (Signed)
He is in no distress at rest.  He has been somewhat uncooperative with nursing staff.  His oxygen saturations are marginally low but he refuses to wear oxygen.  His chest x-ray reveals pulmonary edema pattern.  He is stable enough for transfer to telemetry floor.  I have placed the order for this.  After transfer, PCCM will sign off. Please call if we can be of further assistance    Merton Border, MD PCCM service Mobile [phone] Pager [phone] 03/17/2017 2:45 PM

## 2017-03-17 NOTE — Progress Notes (Signed)
Progress Note  Patient Name: Phillip Warren Date of Encounter: 03/17/2017  Primary Cardiologist: Fletcher Anon  Subjective   Readmitted with inferior STEMI in the setting of medication noncompliance as the patient stated he could not afford medications. Has Medicaid. Underwent emergent LHC that showed an occluded mid to distal RCA due to stent thrombosis. He underwent successful PTCA and was reloaded with Plavix. He continues to note chest and back pain that is not as severe as either of his MIs and worse with deep breath and sitting up. Troponin of 7.70 overnight. Urine drug screen positive for marijuana.   Inpatient Medications    Scheduled Meds: . albuterol  2.5 mg Nebulization Q4H  . albuterol      . aspirin  81 mg Oral Daily  . atorvastatin  40 mg Oral q1800  . carvedilol  3.125 mg Oral BID WC  . clopidogrel  75 mg Oral Q breakfast  . enoxaparin (LOVENOX) injection  30 mg Subcutaneous Q24H  . gabapentin  300 mg Oral TID  . nicotine  21 mg Transdermal Daily  . pantoprazole (PROTONIX) IV  40 mg Intravenous QHS  . sodium chloride flush  3 mL Intravenous Q12H   Continuous Infusions: . sodium chloride    . sodium chloride     PRN Meds: sodium chloride, sodium chloride, acetaminophen, docusate sodium, nitroGLYCERIN, ondansetron (ZOFRAN) IV, oxyCODONE, sodium chloride flush   Vital Signs    Vitals:   03/17/17 0500 03/17/17 0600 03/17/17 0700 03/17/17 0726  BP: 92/66 (!) 87/65 (!) 89/68 102/74  Pulse: (!) 120 (!) 112 (!) 111 (!) 123  Resp: _0 Temp:    98 F (36.7 C)  TempSrc:      SpO2: 94% 94% 95%   Weight:      Height:        Intake/Output Summary (Last 24 hours) at 03/17/2017 0817 Last data filed at 03/17/2017 0515 Gross per 24 hour  Intake 106 ml  Output 600 ml  Net -494 ml   Filed Weights   03/16/17 1851 03/16/17 2040 03/17/17 0424  Weight: 150 lb (68 kg) 151 lb 14.4 oz (68.9 kg) 147 lb 14.9 oz (67.1 kg)    Telemetry    Sinus tachycardia, 120s bpm  - Personally Reviewed  ECG    n/a - Personally Reviewed  Physical Exam   GEN: No acute distress.   Neck: No JVD. Cardiac: Tachycardic, possible rub noted, no murmurs or gallops.  Respiratory: Clear to auscultation bilaterally.  GI: Soft, nontender, non-distended.   MS: No edema; No deformity. Neuro:  Alert and oriented x 3; Nonfocal.  Psych: Normal affect.  Labs    Chemistry Recent Labs  Lab 03/11/17 0401 03/12/17 0407 03/16/17 1852  NA 133* 136 133*  K 3.6 3.9 2.9*  CL 102 105 100*  CO2 24 24 18*  GLUCOSE 92 162* 209*  BUN _1 CREATININE 0.86 1.04 1.13  CALCIUM 8.1* 8.1* 8.6*  PROT  --   --  8.1  ALBUMIN  --   --  3.6  AST  --   --  92*  ALT  --   --  75*  ALKPHOS  --   --  86  BILITOT  --   --  1.0  GFRNONAA >60 >60 >60  GFRAA >60 >60 >60  ANIONGAP _2 Hematology Recent Labs  Lab 03/11/17 0401 03/16/17 1852  WBC 3.8 12.6*  RBC 3.87*  4.42  HGB 11.4* 13.0  HCT 34.5* 39.8*  MCV 89.0 89.9  MCH 29.3 29.4  MCHC 32.9 32.7  RDW 14.9* 15.2*  PLT 100* 232    Cardiac Enzymes Recent Labs  Lab 03/11/17 0401 03/16/17 1852 03/17/17 0245  TROPONINI 28.03* 0.65* 7.70*   No results for input(s): TROPIPOC in the last 168 hours.   BNPNo results for input(s): BNP, PROBNP in the last 168 hours.   DDimer No results for input(s): DDIMER in the last 168 hours.   Radiology    Dg Chest Port 1 View  Result Date: 03/16/2017 IMPRESSION: 1. Vascular congestion and pulmonary edema. 2. No infiltrates or effusions. 3. External pacer paddles in place. Electronically Signed   By: Marijo Sanes M.D.   On: 03/16/2017 21:32    Cardiac Studies   LHC 03/16/2017: Coronary Findings   Diagnostic  Dominance: Right  Left Main  There is mild diffuse disease throughout the vessel.  Left Anterior Descending  Prox LAD to Mid LAD lesion 95% stenosed  Prox LAD to Mid LAD lesion is 95% stenosed. The lesion is type C. The lesion is severely calcified.  First Diagonal  Branch  There is moderate disease in the vessel.  Second Diagonal Branch  There is mild disease in the vessel.  Left Circumflex  Ost Cx to Prox Cx lesion 80% stenosed  Ost Cx to Prox Cx lesion is 80% stenosed.  Right Coronary Artery  Prox RCA lesion 30% stenosed  Prox RCA lesion is 30% stenosed.  Mid RCA-1 lesion 10% stenosed  Mid RCA-1 lesion is 10% stenosed. The lesion is type C. The lesion was previously treated.  Mid RCA-2 lesion 100% stenosed  Mid RCA-2 lesion is 100% stenosed. Vessel is the culprit lesion. The lesion is type C and heavily thrombotic. The lesion is severely calcified. The lesion was previously treated using a drug eluting stent between 2-30 days ago. Previously placed stent displays rethrombosis.  Right Posterior Descending Artery  RPDA lesion 70% stenosed  RPDA lesion is 70% stenosed.  Right Posterior Atrioventricular Branch  Post Atrio lesion 40% stenosed  Post Atrio lesion is 40% stenosed.  Intervention   Mid RCA-1 lesion  Angioplasty (Also treats lesions: Mid RCA-2)  Lesion length: 20 mm. Balloon angioplasty was performed using a BALLOON [place] TREK RX 3.5X8. Maximum pressure: 20 atm. Inflation time: 60 sec.  Supplies used: BALLOON [place] TREK RX 3.5X8  Post-Intervention Lesion Assessment  The intervention was successful. Pre-interventional TIMI flow is 0. Post-intervention TIMI flow is 3. No complications occurred at this lesion.  There is a 10% residual stenosis post intervention.  Mid RCA-2 lesion  Angioplasty (Also treats lesions: Mid RCA-1)  Lesion length: 20 mm. Balloon angioplasty was performed using a BALLOON [place] TREK RX 3.5X8. Maximum pressure: 20 atm. Inflation time: 60 sec.  Supplies used: BALLOON  TREK RX 3.5X8  Post-Intervention Lesion Assessment  The intervention was successful. Pre-interventional TIMI flow is 0. Post-intervention TIMI flow is 3. No complications occurred at this lesion.  There is a 10% residual stenosis post intervention.    Coronary Diagrams   Diagnostic Diagram       Post-Intervention Diagram        Conclusion     Prox LAD to Mid LAD lesion is 95% stenosed.  Ost Cx to Prox Cx lesion is 80% stenosed.  Prox RCA lesion is 30% stenosed.  Post Atrio lesion is 40% stenosed.  RPDA lesion is 70% stenosed.  Mid RCA-1 lesion is 10% stenosed.  Balloon angioplasty was performed.  Mid RCA-2 lesion is 100% stenosed.  Balloon angioplasty was performed.  Post intervention, there is a 10% residual stenosis.  Post intervention, there is a 10% residual stenosis.  Balloon angioplasty was performed using a BALLOON [place] TREK RX 3.5X8.   1.  Severe underlying heavily calcified three-vessel coronary artery disease with acute RCA stent thrombosis likely due to noncompliance with dual antiplatelet therapy. 2.  Moderately elevated left ventricular end-diastolic pressure. 3.  Successful balloon angioplasty to the right coronary artery.  Difficult procedure due to heavy calcifications and difficulty advancing balloons.  Recommendations: The patient was reloaded with clopidogrel.  Continue dual antiplatelet therapy for at least 12 months.  Aggressive treatment of risk factors.  It is not entirely clear if the patient is going to be a candidate for any further revascularization given compliance issues. Recommend case manager consult to assist with medications. The patient reports inability to afford the medications after he was discharged.    Limited TTE pending.   Patient Profile     62 y.o. male with history of liver cirrhosis with prior GI bleed and Recent inferior ST elevation myocardial infarction last week treated successfully with PCI and 2 overlapped drug-eluting stents to the right coronary artery who is being seen today for the evaluation of inferior STEMI in the setting of stent thrombosis secondary to medication noncompliance.   Assessment & Plan    1. Inferior STEMI/CAD: -Due to acute stent  thrombosis due to noncompliance with DAPT -Successful PTCA as detailed above -Continue DAPT with ASA and Plavix for at least 12 months, will need to be certain he has these lined up prior to discharge -He has residual disease affecting the LAD and LCx, doubt he would be a candidate for further revascularization without compliance  -Post cath instructions  2. Chest/back pain: -Possible rub noted on exam -Check limited echo -Given one-time dose of colchicine 0.6 mg this morning   3. Medication noncompliance: -States he cannot afford his medications -Has Medicaid -Corporate investment banker for assistance   4. Polysubstance abuse: -Cessation advised  5. History of liver cirrhosis with previous GI bleed: -Monitor on DAPT  6. Hypokalemia: -Replete to goal > 4.0  7. HLD: -Lipitor  8. Leukocytosis: -Likely inflammatory secondary to #1 -Trend per IM  For questions or updates, please contact Rem[facility] Please consult [email or web address] for contact info under Cardiology/STEMI.    Signed, Christell Faith, PA-C [place] Pager: [phone] 03/17/2017, 8:17 AM

## 2017-03-17 NOTE — Progress Notes (Signed)
Phillip Warren at [facility] NAME: Phillip Warren    MR#:  [id number]  DATE OF BIRTH:  [birth year]  SUBJECTIVE:  CHIEF COMPLAINT:   Chief Complaint  Patient presents with  . Chest Pain  seems somewhat confused. No complaints other than pain all over  REVIEW OF SYSTEMS:  Review of Systems  Constitutional: Negative for chills, fever and weight loss.  HENT: Negative for nosebleeds and sore throat.   Eyes: Negative for blurred vision.  Respiratory: Negative for cough, shortness of breath and wheezing.   Cardiovascular: Negative for chest pain, orthopnea, leg swelling and PND.  Gastrointestinal: Negative for abdominal pain, constipation, diarrhea, heartburn, nausea and vomiting.  Genitourinary: Negative for dysuria and urgency.  Musculoskeletal: Positive for joint pain. Negative for back pain.  Skin: Negative for rash.  Neurological: Negative for dizziness, speech change, focal weakness and headaches.  Endo/Heme/Allergies: Does not bruise/bleed easily.  Psychiatric/Behavioral: Negative for depression.    DRUG ALLERGIES:   Allergies  Allergen Reactions  . Multihance [Gadobenate] Hives and Shortness Of Breath  . Hydrocodone Itching and Nausea And Vomiting  . Vicodin [Hydrocodone-Acetaminophen] Nausea Only and Nausea And Vomiting    Vomiting    VITALS:  Blood pressure 108/78, pulse (!) 108, temperature 98.4 F (36.9 C), resp. rate 18, height 6' (1.829 m), weight 65.3 kg (144 lb), SpO2 94 %. PHYSICAL EXAMINATION:  Physical Exam  Constitutional: He is oriented to person, place, and time and well-developed, well-nourished, and in no distress.  HENT:  Head: Normocephalic and atraumatic.  Eyes: Conjunctivae and EOM are normal. Pupils are equal, round, and reactive to light.  Neck: Normal range of motion. Neck supple. No tracheal deviation present. No thyromegaly present.  Cardiovascular: Normal rate, regular rhythm and normal heart sounds.    Pulmonary/Chest: Effort normal and breath sounds normal. No respiratory distress. He has no wheezes. He exhibits no tenderness.  Abdominal: Soft. Bowel sounds are normal. He exhibits no distension. There is no tenderness.  Musculoskeletal: Normal range of motion.  Neurological: He is alert and oriented to person, place, and time. No cranial nerve deficit.  Skin: Skin is warm and dry. No rash noted.  Psychiatric: Mood and affect normal.   LABORATORY PANEL:  Male CBC Recent Labs  Lab 03/17/17 0824  WBC 6.8  HGB 11.5*  HCT 34.8*  PLT 136*   ------------------------------------------------------------------------------------------------------------------ Chemistries  Recent Labs  Lab 03/16/17 1852 03/17/17 0824  NA 133* 132*  K 2.9* 3.7  CL 100* 104  CO2 18* 19*  GLUCOSE 209* 169*  BUN 12 11  CREATININE 1.13 1.04  CALCIUM 8.6* 7.9*  AST 92*  --   ALT 75*  --   ALKPHOS 86  --   BILITOT 1.0  --    RADIOLOGY:  Dg Chest Port 1 View  Result Date: 03/16/2017 CLINICAL DATA:  Acute respiratory failure. EXAM: PORTABLE CHEST 1 VIEW COMPARISON:  01/02/2017 FINDINGS: The heart is normal in size. The mediastinal and hilar contours are within normal limits. External pacer paddles are noted in place. There are underlying emphysematous changes and pulmonary scarring. There is also vascular congestion and pulmonary edema. No definite pleural effusions. The bony thorax is intact. IMPRESSION: 1. Vascular congestion and pulmonary edema. 2. No infiltrates or effusions. 3. External pacer paddles in place. Electronically Signed   By: Marijo Sanes M.D.   On: 03/16/2017 21:32   ASSESSMENT AND PLAN:  62 y.o. male with history of liver cirrhosis with prior GI bleed  and Recent inferior ST elevation myocardial infarction last week treated successfully with PCI and 2 overlapped drug-eluting stents to the right coronary artery admitted for STEMI in the setting of stent thrombosis secondary to medication  noncompliance.   1. Inferior STEMI/CAD: -Due to acute stent thrombosis due to noncompliance with DAPT - Appreciate cardio input, successful balloon angio without stenting  -Continue ASA and Plavix for at least 12 months per cardio -He has residual disease affecting the LAD and LCx, doubt he would be a candidate for further revascularization without compliance   2. COPD without exacerbation  Stable  Breathing treatments as needed   3 history of alcohol abuse - confused? - continue alcohol withdrawal protocol   4 chronic liver cirrhosis secondary to alcoholism  Stable  Avoid hepatotoxic agents   5 chronic GERD without esophagitis  PPI daily   meds noncompliance is his biggest issue          All the records are reviewed and case discussed with Care Management/Social Worker. Management plans discussed with the patient, nursing and they are in agreement.  CODE STATUS: Full Code  TOTAL TIME TAKING CARE OF THIS PATIENT: 15 minutes.   More than 50% of the time was spent in counseling/coordination of care: YES  POSSIBLE D/C IN 1-2 DAYS, DEPENDING ON CLINICAL CONDITION.   [name] M.D on 03/17/2017 at 9:05 PM  Between 7am to 6pm - Pager - [phone]  After 6pm go to [email or web address] - Proofreader  [facility]  Office  [phone]  CC: Primary care physician; Glendon Axe, MD  Note: This dictation was prepared with Dragon dictation along with smaller phrase technology. Any transcriptional errors that result from this process are unintentional.

## 2017-03-17 NOTE — Progress Notes (Signed)
*  PRELIMINARY RESULTS* Echocardiogram 2D Echocardiogram has been performed.  Phillip Warren 03/17/2017, 9:32 AM

## 2017-03-18 DIAGNOSIS — I255 Ischemic cardiomyopathy: Secondary | ICD-10-CM

## 2017-03-18 DIAGNOSIS — I5021 Acute systolic (congestive) heart failure: Secondary | ICD-10-CM

## 2017-03-18 LAB — CBC
HEMATOCRIT: 35.8 % — AB (ref 40.0–52.0)
Hemoglobin: 11.9 g/dL — ABNORMAL LOW (ref 13.0–18.0)
MCH: 29.8 pg (ref 26.0–34.0)
MCHC: 33.3 g/dL (ref 32.0–36.0)
MCV: 89.7 fL (ref 80.0–100.0)
Platelets: 129 10*3/uL — ABNORMAL LOW (ref 150–440)
RBC: 3.99 MIL/uL — ABNORMAL LOW (ref 4.40–5.90)
RDW: 15.7 % — ABNORMAL HIGH (ref 11.5–14.5)
WBC: 5.1 10*3/uL (ref 3.8–10.6)

## 2017-03-18 LAB — BASIC METABOLIC PANEL
Anion gap: 8 (ref 5–15)
BUN: 12 mg/dL (ref 6–20)
CHLORIDE: 106 mmol/L (ref 101–111)
CO2: 22 mmol/L (ref 22–32)
Calcium: 8.3 mg/dL — ABNORMAL LOW (ref 8.9–10.3)
Creatinine, Ser: 1.13 mg/dL (ref 0.61–1.24)
GFR calc Af Amer: >60 mL/min (ref >60)
GFR calc non Af Amer: >60 mL/min (ref >60)
Glucose, Bld: 141 mg/dL — ABNORMAL HIGH (ref 65–99)
Potassium: 4.2 mmol/L (ref 3.5–5.1)
SODIUM: 136 mmol/L (ref 135–145)

## 2017-03-18 LAB — TROPONIN I: Troponin I: 4.18 ng/mL (ref <0.03)

## 2017-03-18 MED ORDER — ATORVASTATIN CALCIUM 40 MG PO TABS: 40.0000 mg | ORAL_TABLET | Freq: Every day | ORAL | 0 refills | Status: DC

## 2017-03-18 MED ORDER — ASPIRIN 81 MG PO CHEW: 81.0000 mg | CHEWABLE_TABLET | Freq: Every day | ORAL | 0 refills | Status: DC

## 2017-03-18 MED ORDER — CLOPIDOGREL BISULFATE 75 MG PO TABS: 75.0000 mg | ORAL_TABLET | Freq: Every day | ORAL | 0 refills | Status: DC

## 2017-03-18 MED ORDER — ALBUTEROL SULFATE (2.5 MG/3ML) 0.083% IN NEBU: 2.5000 mg | INHALATION_SOLUTION | RESPIRATORY_TRACT | Status: DC | PRN

## 2017-03-18 MED ORDER — OMEPRAZOLE 40 MG PO CPDR: 40.0000 mg | DELAYED_RELEASE_CAPSULE | Freq: Two times a day (BID) | ORAL | 0 refills | Status: DC

## 2017-03-18 MED ORDER — NICOTINE 21 MG/24HR TD PT24: 21.0000 mg | MEDICATED_PATCH | Freq: Every day | TRANSDERMAL | 0 refills | Status: DC

## 2017-03-18 MED ORDER — CARVEDILOL 3.125 MG PO TABS: 3.1250 mg | ORAL_TABLET | Freq: Two times a day (BID) | ORAL | 0 refills | Status: DC

## 2017-03-18 MED ORDER — LISINOPRIL 2.5 MG PO TABS: 2.5000 mg | ORAL_TABLET | Freq: Every day | ORAL | 0 refills | Status: DC

## 2017-03-18 MED ORDER — LISINOPRIL 5 MG PO TABS: 2.5000 mg | ORAL_TABLET | Freq: Every day | ORAL | Status: DC

## 2017-03-18 NOTE — Progress Notes (Signed)
Rounded on patient.  Patient was initially seen by this RN last week on 03/12/2017 where MI / CHF Education was completed.  62 y.o. malewith history of liver cirrhosis with prior GI bleed and Recent inferior ST elevation myocardial infarction last week treated successfully with PCI and 2 overlapped drug-eluting stents to the right coronary artery admitted for STEMI in the setting of stent thrombosis secondary to medication noncompliance.  Inferior STEMI/CAD: -Due to acute stent thrombosis due to noncompliance with DAPT - Successful balloon angio without stenting  -Continue ASA and Plavix for at least 12 months per cardio -He has residual disease affecting the LAD and LCx, doubt he would be a candidate for further revascularization without compliance   Chest/back pain: -Resolved  Medication noncompliance: -States he cannot afford his medications -Has Medicaid -See Care Manager's note.    When this RN entered the room patient was dressed and friend was there ready to drive him home.  Friend had left his truck running while he came upstairs.  Patient waiting on his wallet to be released and he is ready for discharge.  Briefly discussed taking medications as prescribed and following 5 steps to Living Better with Heart Failure.  Patient informed this RN that he has all the information I reviewed with him stacked on his table.  Note:  Patient had previously reported to the CM that he had not opened the scales that were provided to him last week.  The scales are still in the box.  Patient reported that he wants to participate in Cardiac Rehab, but feels he must get himself "straightened out" before doing so.  I reminded patient the most important thing for him to do to help himself is to take his medications as prescribed without fail.  Patient verbalized understanding.    Roanna Epley, RN, BSN, [facility] Cardiovascular and Pulmonary Nurse Navigator

## 2017-03-18 NOTE — Progress Notes (Signed)
IV and tele removed from patient. Discharge instructions given to patient. Verbalized understanding. No distress at this time. Friend at bedside will transport patient home.

## 2017-03-18 NOTE — Care Management (Signed)
Patient for discharge home today.  Patient has not taken the scales out of the bos that CM provide last discharge.  He has relayed to ICU CM that he has difficulty paying for meds.  Previous discharge he had informed this CM that he had not issues paying for meds.  Says that he responded to question to ICU CM because his daughter had his money.  He verbalizes to this CM firmly that he had no problem paying for his meds.  CM suspects however, patient is noncompliant with his medication and medical management regime.  He is not homebound, therefore is not eligible for home health.  He denies issues with transportation that would prevent him from getting to MD appointments

## 2017-03-18 NOTE — Telephone Encounter (Signed)
Patient contacted regarding discharge from [place] on 03/24/17.   Patient understands to follow up with provider ? On 03/24/17 at 4:40pm at Dr Fletcher Anon.  Patient understands discharge instructions? Yes  Patient understands medications and regiment? Yes  Patient understands to bring all medications to this visit? Yes

## 2017-03-18 NOTE — Care Management Important Message (Signed)
Important Message  Patient Details  Name: Phillip Warren MRN: [id number] Date of Birth: 14-May-1955   Medicare Important Message Given:  N/A - LOS <3 / Initial given by admissions    Katrina Stack, RN 03/18/2017, 11:00 AM

## 2017-03-18 NOTE — Discharge Instructions (Signed)
Acute Coronary Syndrome °Acute coronary syndrome (ACS) is a serious problem in which there is suddenly not enough blood and oxygen reaching the heart. ACS can result in chest pain or a heart attack. °What are the causes? °This condition may be caused by: °· A buildup of fat and cholesterol inside of the arteries (atherosclerosis). This is the most common cause. The buildup (plaque) can cause the blood vessels in your heart (coronary arteries) to become narrow or blocked. Plaque can also break off to form a clot. °· A coronary spasm. °· A tearing of the coronary artery (spontaneous coronary artery dissection). °· Low blood pressure (hypotension). °· An abnormal heart beat (arrhythmia). °· Using cocaine or methamphetamine. ° °What increases the risk? °The following factors may make you more likely to develop this condition: °· Age. °· History of chest pain, heart attack, or stroke. °· Being male. °· Family history of chest pain, heart disease, or stroke. °· Smoking. °· Inactivity. °· Being overweight. °· High cholesterol. °· High blood pressure (hypertension). °· Diabetes. °· Excessive alcohol use. ° °What are the signs or symptoms? °Common symptoms of this condition include: °· Chest pain. The pain may last long, or may stop and come back (recur). It may feel like: °? Crushing or squeezing. °? Tightness, pressure, fullness, or heaviness. °· Arm, neck, jaw, or back pain. °· Heartburn or indigestion. °· Shortness of breath. °· Nausea. °· Sudden cold sweats. °· Lightheadedness. °· Dizziness. °· Tiredness (fatigue). ° °Sometimes there are no symptoms. °How is this diagnosed? °This condition may be diagnosed through: °· An electrocardiogram (ECG). This test records the impulses of the heart. °· Blood tests. °· A CT scan of the chest. °· A coronary angiogram. This procedure checks for a blockage in the coronary arteries. ° °How is this treated? °Treatment for this condition may include: °· Oxygen. °· Medicines, such  as: °? Antiplatelet medicines and blood-thinning medicines, such as aspirin. These help prevent blood clots. °? Fibrinolytic therapy. This breaks apart a blood clot. °? Blood pressure medicines. °? Nitroglycerin. °? Pain medicine. °? Cholesterol medicine. °· A procedure called coronary angioplasty and stenting. This is done to widen a narrowed artery and keep it open. °· Coronary artery bypass surgery. This allows blood to pass the blockage to reach your heart. °· Cardiac rehabilitation. This is a program that helps improve your health and well-being. It includes exercise training, education, and counseling to help you recover. ° °Follow these instructions at home: °Eating and drinking °· Follow a heart-healthy, low-salt (sodium) diet. °· Use healthy cooking methods such as roasting, grilling, broiling, baking, poaching, steaming, or stir-frying. °· Talk to a dietitian to learn about healthy cooking methods and how to eat less sodium. °Medicines °· Take over-the-counter and prescription medicines only as told by your health care provider. °· Do not take these medicines unless your health care provider approves: °? Nonsteroidal anti-inflammatory drugs (NSAIDs), such as ibuprofen, naproxen, or celecoxib. °? Vitamin supplements that contain vitamin A or vitamin E. °? Hormone replacement therapy that contains estrogen. °Activity °· Join a cardiac rehabilitation program. °· Ask your health care provider: °? What activities and exercises are safe for you. °? If you should follow specific instructions about lifting, driving, or climbing stairs. °· If you are taking aspirin and another blood thinning medicine, avoid activities that are likely to result in an injury. The medicines can increase your risk of bleeding. °Lifestyle °· Do not use any products that contain nicotine or tobacco, such as cigarettes   and e-cigarettes. If you need help quitting, ask your health care provider. °· If you drink alcohol and your health care  provider says it is okay to drink, limit your alcohol intake to no more than 1 drink per day. One drink equals 12 oz of beer, 5 oz of wine, or 1½ oz of hard liquor. °· Maintain a healthy weight. If you need to lose weight, do it in a way that has been approved by your health care provider. °General instructions °· Tell all your health care providers about your heart condition, including your dentist. Some medicines can increase your risk of arrhythmia. °· Manage other health conditions, such as hypertension and diabetes. These conditions affect your heart. °· Learn ways to manage stress. °· Get screened for depression, and seek treatment if needed. °· Monitor your blood pressure if told by your health care provider. °· Keep your vaccinations up to date. Get the annual influenza vaccine. °· Keep all follow-up visits as told by your health care provider. This is important. °Contact a health care provider if: °· You feel overwhelmed or sad. °· You have trouble with your daily activities. °Get help right away if: °· You have pain in your chest, neck, arm, jaw, stomach, or back that recurs, and: °? Lasts more than a few minutes. °? Is not relieved by taking the medicineyour health care provider prescribed. °· You have unexplained: °? Heavy sweating. °? Heartburn or indigestion. °? Shortness of breath. °? Difficulty breathing. °? Nausea or vomiting. °? Fatigue. °? Nervousness or anxiety. °? Weakness. °? Diarrhea. °? Dark stools or blood in the stool. °· You have sudden lightheadedness or dizziness. °· Your blood pressure is higher than 180/120 °· You faint. °· You feel like hurting yourself or think about taking your own life. °These symptoms may represent a serious problem that is an emergency. Do not wait to see if the symptoms will go away. Get medical help right away. Call your local emergency services (911 in the [place] Do not drive yourself to the clinic or hospital. °Summary °· Acute coronary syndrome (ACS) is a  when there is not enough blood and oxygen being supplied to the heart. ACS can result in chest pain or a heart attack. °· Acute coronary syndrome is a medical emergency. If you have any symptoms of this condition, get help right away. °· Treatment includes oxygen, medicines, and procedures to open the blocked arteries and restore blood flow. °This information is not intended to replace advice given to you by your health care provider. Make sure you discuss any questions you have with your health care provider. °Document Released: 01/13/2005 Document Revised: 02/15/2016 Document Reviewed: 02/15/2016 °Elsevier Interactive Patient Education © 2018 [employer] ° °

## 2017-03-18 NOTE — Progress Notes (Signed)
Progress Note  Patient Name: Phillip Warren Date of Encounter: 03/18/2017  Primary Cardiologist: Fletcher Anon  Subjective   Chest pain resolved. Continues to note nausea and vomiting. No appetite. Troponin peaked at 8.98.   Inpatient Medications    Scheduled Meds: . aspirin  81 mg Oral Daily  . atorvastatin  40 mg Oral q1800  . carvedilol  3.125 mg Oral BID WC  . clopidogrel  75 mg Oral Q breakfast  . enoxaparin (LOVENOX) injection  30 mg Subcutaneous Q24H  . gabapentin  300 mg Oral TID  . nicotine  21 mg Transdermal Daily  . pantoprazole  40 mg Oral Daily  . potassium chloride  40 mEq Oral BID  . sodium chloride flush  3 mL Intravenous Q12H   Continuous Infusions: . sodium chloride    . sodium chloride     PRN Meds: sodium chloride, sodium chloride, acetaminophen, albuterol, docusate sodium, LORazepam, metoCLOPramide (REGLAN) injection, nitroGLYCERIN, ondansetron (ZOFRAN) IV, oxyCODONE, sodium chloride flush   Vital Signs    Vitals:   03/17/17 2022 03/18/17 0340 03/18/17 0500 03/18/17 0741  BP: 108/78 99/67    Pulse: (!) 108 (!) 110    Resp: 18     Temp: 98.4 F (36.9 C) 98.3 F (36.8 C)    TempSrc:  Oral    SpO2: 94% 95%  93%  Weight: 144 lb (65.3 kg)  140 lb 6.4 oz (63.7 kg)   Height: 6' (1.829 m)       Intake/Output Summary (Last 24 hours) at 03/18/2017 0839 Last data filed at 03/18/2017 0500 Gross per 24 hour  Intake 403 ml  Output 1000 ml  Net -597 ml   Filed Weights   03/17/17 0424 03/17/17 2022 03/18/17 0500  Weight: 147 lb 14.9 oz (67.1 kg) 144 lb (65.3 kg) 140 lb 6.4 oz (63.7 kg)    Telemetry    Sinus tachycardia, low 100s to 120s bpm - Personally Reviewed  ECG    n/a - Personally Reviewed  Physical Exam   GEN: Frail appearing; No acute distress.   Neck: No JVD. Cardiac: Tachycardic, II/VI systolic murmur, no rubs, or gallops.  Respiratory: Clear to auscultation bilaterally.  GI: Soft, nontender, non-distended.   MS: No edema; No  deformity. Neuro:  Alert and oriented x 3; Nonfocal.  Psych: Normal affect.  Labs    Chemistry Recent Labs  Lab 03/16/17 1852 03/17/17 0824 03/18/17 0517  NA 133* 132* 136  K 2.9* 3.7 4.2  CL 100* 104 106  CO2 18* 19* 22  GLUCOSE 209* 169* 141*  BUN 12 11 12   CREATININE 1.13 1.04 1.13  CALCIUM 8.6* 7.9* 8.3*  PROT 8.1  --   --   ALBUMIN 3.6  --   --   AST 92*  --   --   ALT 75*  --   --   ALKPHOS 86  --   --   BILITOT 1.0  --   --   GFRNONAA >60 >60 >60  GFRAA >60 >60 >60  ANIONGAP 15 9 8      Hematology Recent Labs  Lab 03/16/17 1852 03/17/17 0824 03/18/17 0517  WBC 12.6* 6.8 5.1  RBC 4.42 3.91* 3.99*  HGB 13.0 11.5* 11.9*  HCT 39.8* 34.8* 35.8*  MCV 89.9 89.1 89.7  MCH 29.4 29.5 29.8  MCHC 32.7 33.1 33.3  RDW 15.2* 15.2* 15.7*  PLT 232 136* 129*    Cardiac Enzymes Recent Labs  Lab 03/16/17 1852 03/17/17 0245 03/17/17 3734  03/18/17 0517  TROPONINI 0.65* 7.70* 8.98* 4.18*   No results for input(s): TROPIPOC in the last 168 hours.   BNPNo results for input(s): BNP, PROBNP in the last 168 hours.   DDimer No results for input(s): DDIMER in the last 168 hours.   Radiology    Dg Chest Port 1 View  Result Date: 03/16/2017 IMPRESSION: 1. Vascular congestion and pulmonary edema. 2. No infiltrates or effusions. 3. External pacer paddles in place. Electronically Signed   By: Marijo Sanes M.D.   On: 03/16/2017 21:32    Cardiac Studies   LHC 03/16/2017: Coronary Findings   Diagnostic  Dominance: Right  Left Main  There is mild diffuse disease throughout the vessel.  Left Anterior Descending  Prox LAD to Mid LAD lesion 95% stenosed  Prox LAD to Mid LAD lesion is 95% stenosed. The lesion is type C. The lesion is severely calcified.  First Diagonal Branch  There is moderate disease in the vessel.  Second Diagonal Branch  There is mild disease in the vessel.  Left Circumflex  Ost Cx to Prox Cx lesion 80% stenosed  Ost Cx to Prox Cx lesion is 80%  stenosed.  Right Coronary Artery  Prox RCA lesion 30% stenosed  Prox RCA lesion is 30% stenosed.  Mid RCA-1 lesion 10% stenosed  Mid RCA-1 lesion is 10% stenosed. The lesion is type C. The lesion was previously treated.  Mid RCA-2 lesion 100% stenosed  Mid RCA-2 lesion is 100% stenosed. Vessel is the culprit lesion. The lesion is type C and heavily thrombotic. The lesion is severely calcified. The lesion was previously treated using a drug eluting stent between 2-30 days ago. Previously placed stent displays rethrombosis.  Right Posterior Descending Artery  RPDA lesion 70% stenosed  RPDA lesion is 70% stenosed.  Right Posterior Atrioventricular Branch  Post Atrio lesion 40% stenosed  Post Atrio lesion is 40% stenosed.  Intervention   Mid RCA-1 lesion  Angioplasty (Also treats lesions: Mid RCA-2)  Lesion length: 20 mm. Balloon angioplasty was performed using a BALLOON [place] TREK RX 3.5X8. Maximum pressure: 20 atm. Inflation time: 60 sec.  Supplies used: BALLOON [place] TREK RX 3.5X8  Post-Intervention Lesion Assessment  The intervention was successful. Pre-interventional TIMI flow is 0. Post-intervention TIMI flow is 3. No complications occurred at this lesion.  There is a 10% residual stenosis post intervention.  Mid RCA-2 lesion  Angioplasty (Also treats lesions: Mid RCA-1)  Lesion length: 20 mm. Balloon angioplasty was performed using a BALLOON [place] TREK RX 3.5X8. Maximum pressure: 20 atm. Inflation time: 60 sec.  Supplies used: BALLOON [place] TREK RX 3.5X8  Post-Intervention Lesion Assessment  The intervention was successful. Pre-interventional TIMI flow is 0. Post-intervention TIMI flow is 3. No complications occurred at this lesion.  There is a 10% residual stenosis post intervention.  Coronary Diagrams   Diagnostic Diagram       Post-Intervention Diagram        Conclusion     Prox LAD to Mid LAD lesion is 95% stenosed.  Ost Cx to Prox Cx lesion is 80% stenosed.  Prox  RCA lesion is 30% stenosed.  Post Atrio lesion is 40% stenosed.  RPDA lesion is 70% stenosed.  Mid RCA-1 lesion is 10% stenosed.  Balloon angioplasty was performed.  Mid RCA-2 lesion is 100% stenosed.  Balloon angioplasty was performed.  Post intervention, there is a 10% residual stenosis.  Post intervention, there is a 10% residual stenosis.  Balloon angioplasty was performed using a BALLOON  [place] TREK RX 3.5X8.  1. Severe underlying heavily calcified three-vessel coronary artery disease with acute RCA stent thrombosis likely due to noncompliance with dual antiplatelet therapy. 2. Moderately elevated left ventricular end-diastolic pressure. 3. Successful balloon angioplasty to the right coronary artery. Difficult procedure due to heavy calcifications and difficulty advancing balloons.  Recommendations: The patient was reloaded with clopidogrel. Continue dual antiplatelet therapy for at least 12 months. Aggressive treatment of risk factors. It is not entirely clear if the patient is going to be a candidate for any further revascularization given compliance issues. Recommend case manager consult to assist with medications. The patient reports inability to afford the medications after he was discharged.    Limited TTE 03/17/17: Study Conclusions  - Limited study for to evaluate for pericardial effusion. - Left ventricle: The cavity size was normal. Wall thickness was   increased in a pattern of mild LVH. Systolic function was   severely reduced. The estimated ejection fraction was in the   range of 25% to 30%. There is severe hypokinesis of the   apicalanteroseptal, anterior, and apical myocardium. There is   severe hypokinesis of the inferior and inferoseptal myocardium. - Mitral valve: There was moderate to severe regurgitation. - Pericardium, extracardiac: There was no pericardial effusion.  Impressions:  - Compared with prior echo from last week, LVEF has  decreased and   mitral regurgitation has increased.  Patient Profile     62 y.o. male with history of liver cirrhosis with prior GI bleed and Recent inferior ST elevation myocardial infarction last week treated successfully with PCI and 2 overlapped drug-eluting stents to the right coronary artery who is being seen today for the evaluation of inferior STEMI in the setting of stent thrombosis secondary to medication noncompliance.  Assessment & Plan    1. Inferior STEMI/CAD: -Due to acute stent thrombosis due to noncompliance with DAPT -Successful PTCA as detailed above -Continue DAPT with ASA and Plavix for at least 12 months, will need to be certain he has these lined up prior to discharge -He has residual disease affecting the LAD and LCx, doubt he would be a candidate for further revascularization without compliance  -Post cath instructions  2. Chest/back pain: -Resolved  3. Medication noncompliance: -States he cannot afford his medications -Has Medicaid -Corporate investment banker for assistance   4. Polysubstance abuse: -Cessation advised  5. History of liver cirrhosis with previous GI bleed: -Monitor on DAPT  6. Hypokalemia: -Repleted  7. HLD: -Lipitor  8. Leukocytosis: -Resolved  9. Nausea/vomiting/decreased appetite: -Per IM   For questions or updates, please contact [place] Please consult [email or web address] for contact info under Cardiology/STEMI.    Signed, Christell Faith, PA-C [place] Pager: [phone] 03/18/2017, 8:39 AM

## 2017-03-22 NOTE — Discharge Summary (Signed)
[facility] [place] NAME: Phillip Warren    MR#:  [id number]  DATE OF BIRTH:  Sep 10, 1955  DATE OF ADMISSION:  03/16/2017   ADMITTING PHYSICIAN: Gorden Harms, MD  DATE OF DISCHARGE: 03/18/2017 11:15 AM  PRIMARY CARE PHYSICIAN: Glendon Axe, MD   ADMISSION DIAGNOSIS:  Acute ST elevation myocardial infarction (STEMI) of inferior wall ([place]) [I21.19] DISCHARGE DIAGNOSIS:  Active Problems:   Acute ST elevation myocardial infarction (STEMI) involving right coronary artery ([place])  SECONDARY DIAGNOSIS:   Past Medical History:  Diagnosis Date  . Alcohol abuse   . Anorexia    has trouble eating due to poor appetite  . Anxiety    trouble sleeping; lost two sisters and his mother close together  . Arthritis    hands, feet (burning, stinging)  . Cancer [facility])    liver lesion initial staging.  . CHF (congestive heart failure) ([place])   . Cirrhosis of liver ([place])   . Cocaine abuse ([place])   . COPD (chronic obstructive pulmonary disease) ([place])   . Coronary artery disease   . GERD (gastroesophageal reflux disease)   . H/O dizziness   . Headache   . Hepatitis    B and C (chronic)  . Marijuana use   . Myocardial infarction ([place])    massive MI at age 32  . Neuropathy   . Numbness and tingling    legs and feet bilat   . Pneumonia   . Repeated falls   . Shortness of breath dyspnea   . Tinnitus   . Tobacco abuse    HOSPITAL COURSE:  62 y.o.malewith history of liver cirrhosis with prior GI bleed and Recent inferior ST elevation myocardial infarction last week treated successfully with PCI and 2 overlapped drug-eluting stents to the right coronary artery admitted for Inferior STEMI in the setting of stent thrombosis secondary to medication noncompliance.   1.Inferior STEMI/CAD: no further chest pain, resolved after single dose of colchicine for suspected post MI pericarditis.  -Due to acute stent thrombosis due to noncompliance with  DAPT - successful balloon angio without stenting on this admission  -Continue ASA and Plavix for at least 12 months per cardio -He has residual disease affecting the LAD and LCx, doubt he would be a candidate for further revascularization without meds compliance   2. COPD without exacerbation  Stable   3history of alcohol abuse - counseled, this could be contributing to his Nausea, Poor appetite.  4chronic liver cirrhosis secondary to alcoholism   5chronic GERD without esophagitis  PPI daily   meds noncompliance is his biggest issue - Importance of medication compliance, particularly with dual antiplatelet therapy, was reiterated today by myself and Cardiology.  Ideally, I would like to keep him in the Hospital for at least 1 more day for close observation and medication titration but he was adamant in wanting to leave. He didn't have any further chest pain or nausea so I've decided to discharge him with outpt f/up with PCP & Cardio. DISCHARGE CONDITIONS:  stable CONSULTS OBTAINED:  Treatment Team:  Salary, Avel Peace, MD End, Harrell Gave, MD DRUG ALLERGIES:   Allergies  Allergen Reactions  . Multihance [Gadobenate] Hives and Shortness Of Breath  . Hydrocodone Itching and Nausea And Vomiting  . Vicodin [Hydrocodone-Acetaminophen] Nausea Only and Nausea And Vomiting    Vomiting    DISCHARGE MEDICATIONS:   Allergies as of 03/18/2017      Reactions   Multihance [gadobenate] Hives, Shortness  Of Breath   Hydrocodone Itching, Nausea And Vomiting   Vicodin [hydrocodone-acetaminophen] Nausea Only, Nausea And Vomiting   Vomiting      Medication List    STOP taking these medications   docusate sodium 100 MG capsule Commonly known as:  COLACE   oxyCODONE 5 MG immediate release tablet Commonly known as:  ROXICODONE     TAKE these medications   aspirin 81 MG chewable tablet Chew 1 tablet (81 mg total) by mouth daily.   atorvastatin 40 MG tablet Commonly known as:   LIPITOR Take 1 tablet (40 mg total) by mouth daily at 6 PM.   carvedilol 3.125 MG tablet Commonly known as:  COREG Take 1 tablet (3.125 mg total) by mouth 2 (two) times daily with a meal.   clopidogrel 75 MG tablet Commonly known as:  PLAVIX Take 1 tablet (75 mg total) by mouth daily with breakfast.   gabapentin 800 MG tablet Commonly known as:  NEURONTIN Take 800 mg by mouth 4 (four) times daily. What changed:  Another medication with the same name was removed. Continue taking this medication, and follow the directions you see here.   lisinopril 2.5 MG tablet Commonly known as:  PRINIVIL,ZESTRIL Take 1 tablet (2.5 mg total) by mouth daily.   nicotine 21 mg/24hr patch Commonly known as:  NICODERM CQ - dosed in mg/24 hours Place 1 patch (21 mg total) onto the skin daily.   nitroGLYCERIN 0.4 MG SL tablet Commonly known as:  NITROSTAT Place 0.4 mg under the tongue every 5 (five) minutes as needed for chest pain.   omeprazole 40 MG capsule Commonly known as:  PRILOSEC Take 1 capsule (40 mg total) by mouth 2 (two) times daily at 10 AM and 5 PM.   traMADol 50 MG tablet Commonly known as:  ULTRAM Take 50 mg by mouth 2 (two) times daily.        DISCHARGE INSTRUCTIONS:   DIET:  Cardiac diet DISCHARGE CONDITION:  Stable ACTIVITY:  Activity as tolerated OXYGEN:  Home Oxygen: No.  Oxygen Delivery: room air DISCHARGE LOCATION:  home   If you experience worsening of your admission symptoms, develop shortness of breath, life threatening emergency, suicidal or homicidal thoughts you must seek medical attention immediately by calling 911 or calling your MD immediately  if symptoms less severe.  You Must read complete instructions/literature along with all the possible adverse reactions/side effects for all the Medicines you take and that have been prescribed to you. Take any new Medicines after you have completely understood and accpet all the possible adverse reactions/side  effects.   Please note  You were cared for by a hospitalist during your hospital stay. If you have any questions about your discharge medications or the care you received while you were in the hospital after you are discharged, you can call the unit and asked to speak with the hospitalist on call if the hospitalist that took care of you is not available. Once you are discharged, your primary care physician will handle any further medical issues. Please note that NO REFILLS for any discharge medications will be authorized once you are discharged, as it is imperative that you return to your primary care physician (or establish a relationship with a primary care physician if you do not have one) for your aftercare needs so that they can reassess your need for medications and monitor your lab values.    On the day of Discharge:  VITAL SIGNS:  Blood pressure 108/73, pulse 64,  temperature 98.3 F (36.8 C), temperature source Oral, resp. rate 18, height 6' (1.829 m), weight 63.7 kg (140 lb 6.4 oz), SpO2 92 %. PHYSICAL EXAMINATION:  GENERAL:  62 y.o.-year-old patient lying in the bed with no acute distress.  EYES: Pupils equal, round, reactive to light and accommodation. No scleral icterus. Extraocular muscles intact.  HEENT: Head atraumatic, normocephalic. Oropharynx and nasopharynx clear.  NECK:  Supple, no jugular venous distention. No thyroid enlargement, no tenderness.  LUNGS: Normal breath sounds bilaterally, no wheezing, rales,rhonchi or crepitation. No use of accessory muscles of respiration.  CARDIOVASCULAR: S1, S2 normal. No murmurs, rubs, or gallops.  ABDOMEN: Soft, non-tender, non-distended. Bowel sounds present. No organomegaly or mass.  EXTREMITIES: No pedal edema, cyanosis, or clubbing.  NEUROLOGIC: Cranial nerves II through XII are intact. Muscle strength 5/5 in all extremities. Sensation intact. Gait not checked.  PSYCHIATRIC: The patient is alert and oriented x 3.  SKIN: No obvious  rash, lesion, or ulcer.  DATA REVIEW:   CBC Recent Labs  Lab 03/18/17 0517  WBC 5.1  HGB 11.9*  HCT 35.8*  PLT 129*    Chemistries  Recent Labs  Lab 03/16/17 1852  03/18/17 0517  NA 133*   < > 136  K 2.9*   < > 4.2  CL 100*   < > 106  CO2 18*   < > 22  GLUCOSE 209*   < > 141*  BUN 12   < > 12  CREATININE 1.13   < > 1.13  CALCIUM 8.6*   < > 8.3*  AST 92*  --   --   ALT 75*  --   --   ALKPHOS 86  --   --   BILITOT 1.0  --   --    < > = values in this interval not displayed.     Follow-up Information    Glendon Axe, MD. Go on 03/19/2017.   Specialty:  Internal Medicine Why:  Appointment Time: 2:45pm  Contact information: [place] [address] [phone]        Nelva Bush, MD. Go on 03/24/2017.   Specialty:  Cardiology Why:  Please keep and go to appointment already scheduled for 03/24/2017 at 4:40pm with Dr. Fletcher Anon. Thanks! Contact information: [place] [address] [phone]            Management plans discussed with the patient, family and they are in agreement.  CODE STATUS: Prior   TOTAL TIME TAKING CARE OF THIS PATIENT: 45 minutes.    [name] M.D on 03/22/2017 at 1:59 PM  Between 7am to 6pm - Pager - [phone]  After 6pm go to [email or web address] - Proofreader  [facility] [place] [facility]  Office  [phone]  CC: Primary care physician; Glendon Axe, MD   Note: This dictation was prepared with Dragon dictation along with smaller phrase technology. Any transcriptional errors that result from this process are unintentional.

## 2017-03-24 ENCOUNTER — Telehealth: Payer: Self-pay | Admitting: Cardiovascular Disease

## 2017-03-24 ENCOUNTER — Ambulatory Visit (INDEPENDENT_AMBULATORY_CARE_PROVIDER_SITE_OTHER): Payer: Medicare Other | Admitting: Cardiovascular Disease

## 2017-03-24 ENCOUNTER — Encounter: Payer: Self-pay | Admitting: Cardiovascular Disease

## 2017-03-24 VITALS — BP 90/50 | HR 67 | Ht 72.0 in | Wt 146.2 lb

## 2017-03-24 DIAGNOSIS — Z720 Tobacco use: Secondary | ICD-10-CM | POA: Diagnosis not present

## 2017-03-24 DIAGNOSIS — I2111 ST elevation (STEMI) myocardial infarction involving right coronary artery: Secondary | ICD-10-CM

## 2017-03-24 DIAGNOSIS — I5022 Chronic systolic (congestive) heart failure: Secondary | ICD-10-CM | POA: Diagnosis not present

## 2017-03-24 DIAGNOSIS — E785 Hyperlipidemia, unspecified: Secondary | ICD-10-CM | POA: Diagnosis not present

## 2017-03-24 MED ORDER — CARVEDILOL 3.125 MG PO TABS: 3.1250 mg | ORAL_TABLET | Freq: Two times a day (BID) | ORAL | 5 refills | Status: DC

## 2017-03-24 MED ORDER — LISINOPRIL 2.5 MG PO TABS: 2.5000 mg | ORAL_TABLET | Freq: Every day | ORAL | 5 refills | Status: AC

## 2017-03-24 MED ORDER — ATORVASTATIN CALCIUM 40 MG PO TABS: 40.0000 mg | ORAL_TABLET | Freq: Every day | ORAL | 5 refills | Status: DC

## 2017-03-24 MED ORDER — PANTOPRAZOLE SODIUM 40 MG PO TBEC: 40.0000 mg | DELAYED_RELEASE_TABLET | Freq: Every day | ORAL | 11 refills | Status: AC

## 2017-03-24 MED ORDER — CLOPIDOGREL BISULFATE 75 MG PO TABS: 75.0000 mg | ORAL_TABLET | Freq: Every day | ORAL | 5 refills | Status: AC

## 2017-03-24 NOTE — Progress Notes (Signed)
Cardiology Office Note   Date:  03/24/2017   ID:  [name], DOB 10-Aug-1955, MRN [id number]  PCP:  Glendon Axe, MD  Cardiologist:   Kathlyn Sacramento, MD   Chief Complaint  Patient presents with  . OTHER    STEMI c/o sob. Meds reviewed verbally with pt.      History of Present Illness: Phillip Warren is a 62 y.o. male who presents for follow-up visit regarding coronary artery disease and recent hospitalizations for inferior ST elevation myocardial infarction. He has known history of liver cirrhosis with prior GI bleed, prior alcohol abuse, chronic anorexia, anxiety, severe peripheral neuropathy, previous liver cancer which was treated, previous cocaine use, COPD with tobacco use and hepatitis C. He presented recently to [facility] with acute onset of chest pain and was found to have inferior ST elevation myocardial infarction.  I proceeded with emergent cardiac catheterization which showed subtotal occlusion of the midright coronary artery with heavy thrombus burden and heavily calcified vessels.  There was also severe disease affecting the LAD and ostial left circumflex.  I performed successful angioplasty and 2 overlapped drug-eluting stent placement to the RCA.  The patient returned with acute stent thrombosis few days after hospital discharge due to not taking any of his cardiac medications including aspirin and Plavix.  He was found to have an occluded distal stent which was treated successfully with balloon angioplasty. Echocardiogram showed an EF of 25-30% with severe hypokinesis of anteroseptal, anterior and apical myocardium.  There was also severe hypokinesis of the inferior and inferoseptal myocardium.  There was moderate to severe mitral regurgitation but no pericardial effusion.  Since hospital discharge, he reports feeling significantly better with minimal chest pain.  He reports improvement in shortness of breath.  He is taking his medications regularly now.  He cut down  on smoking to about 5 cigarettes a day.  He denies any current alcohol use.  He admits to using marijuana but not as much as before.    Past Medical History:  Diagnosis Date  . Alcohol abuse   . Anorexia    has trouble eating due to poor appetite  . Anxiety    trouble sleeping; lost two sisters and his mother close together  . Arthritis    hands, feet (burning, stinging)  . Cancer [facility])    liver lesion initial staging.  . CHF (congestive heart failure) ([place])   . Cirrhosis of liver ([place])   . Cocaine abuse ([place])   . COPD (chronic obstructive pulmonary disease) ([place])   . Coronary artery disease   . GERD (gastroesophageal reflux disease)   . H/O dizziness   . Headache   . Hepatitis    B and C (chronic)  . Marijuana use   . Myocardial infarction ([place])    massive MI at age 67  . Neuropathy   . Numbness and tingling    legs and feet bilat   . Pneumonia   . Repeated falls   . Shortness of breath dyspnea   . Tinnitus   . Tobacco abuse     Past Surgical History:  Procedure Laterality Date  . CARDIAC SURGERY    . COLONOSCOPY WITH PROPOFOL N/A 08/31/2014   Procedure: COLONOSCOPY WITH PROPOFOL;  Surgeon: Josefine Class, MD;  Location: [facility] ENDOSCOPY;  Service: Endoscopy;  Laterality: N/A;  . CORONARY ANGIOPLASTY     at age 82 secondary to massive MI  . CORONARY/GRAFT ACUTE MI REVASCULARIZATION N/A 03/09/2017   Procedure: Coronary/Graft  Acute MI Revascularization;  Surgeon: Wellington Hampshire, MD;  Location: [place] CV LAB;  Service: Cardiovascular;  Laterality: N/A;  . CORONARY/GRAFT ACUTE MI REVASCULARIZATION N/A 03/16/2017   Procedure: Coronary/Graft Acute MI Revascularization;  Surgeon: Wellington Hampshire, MD;  Location: [place] CV LAB;  Service: Cardiovascular;  Laterality: N/A;  balloon only RCA  . ESOPHAGOGASTRODUODENOSCOPY (EGD) WITH PROPOFOL N/A 08/31/2014   Procedure: ESOPHAGOGASTRODUODENOSCOPY (EGD) WITH PROPOFOL;  Surgeon: Josefine Class, MD;  Location:  [facility] ENDOSCOPY;  Service: Endoscopy;  Laterality: N/A;  . ESOPHAGOGASTRODUODENOSCOPY (EGD) WITH PROPOFOL N/A 01/02/2017   Procedure: ESOPHAGOGASTRODUODENOSCOPY (EGD) WITH PROPOFOL;  Surgeon: [place], Benay Pike, MD;  Location: [facility] ENDOSCOPY;  Service: Gastroenterology;  Laterality: N/A;  . LEFT HEART CATH AND CORONARY ANGIOGRAPHY N/A 03/09/2017   Procedure: LEFT HEART CATH AND CORONARY ANGIOGRAPHY;  Surgeon: Wellington Hampshire, MD;  Location: [place] CV LAB;  Service: Cardiovascular;  Laterality: N/A;  . LEFT HEART CATH AND CORONARY ANGIOGRAPHY N/A 03/16/2017   Procedure: LEFT HEART CATH AND CORONARY ANGIOGRAPHY;  Surgeon: Wellington Hampshire, MD;  Location: [place] CV LAB;  Service: Cardiovascular;  Laterality: N/A;     Current Outpatient Medications  Medication Sig Dispense Refill  . aspirin 81 MG chewable tablet Chew 1 tablet (81 mg total) by mouth daily. 30 tablet 0  . atorvastatin (LIPITOR) 40 MG tablet Take 1 tablet (40 mg total) by mouth daily at 6 PM. 30 tablet 0  . carvedilol (COREG) 3.125 MG tablet Take 1 tablet (3.125 mg total) by mouth 2 (two) times daily with a meal. 60 tablet 0  . clopidogrel (PLAVIX) 75 MG tablet Take 1 tablet (75 mg total) by mouth daily with breakfast. 30 tablet 0  . gabapentin (NEURONTIN) 800 MG tablet Take 800 mg by mouth 4 (four) times daily.    Marland Kitchen lisinopril (PRINIVIL,ZESTRIL) 2.5 MG tablet Take 1 tablet (2.5 mg total) by mouth daily. 30 tablet 0  . nitroGLYCERIN (NITROSTAT) 0.4 MG SL tablet Place 0.4 mg under the tongue every 5 (five) minutes as needed for chest pain.     Marland Kitchen omeprazole (PRILOSEC) 40 MG capsule Take 1 capsule (40 mg total) by mouth 2 (two) times daily at 10 AM and 5 PM. 60 capsule 0   No current facility-administered medications for this visit.     Allergies:   Multihance [gadobenate]; Hydrocodone; and Vicodin [hydrocodone-acetaminophen]    Social History:  The patient  reports that he has been smoking cigarettes.  He has a 7.50  pack-year smoking history. he has never used smokeless tobacco. He reports that he uses drugs. Drugs: Marijuana and Cocaine. He reports that he does not drink alcohol.   Family History:  The patient's family history includes CAD in his father; Lung cancer in his sister.    ROS:  Please see the history of present illness.   Otherwise, review of systems are positive for none.   All other systems are reviewed and negative.    PHYSICAL EXAM: VS:  BP (!) 90/50 (BP Location: Left Arm, Patient Position: Sitting, Cuff Size: Normal)   Pulse 67   Ht 6' (1.829 m)   Wt 146 lb 4 oz (66.3 kg)   BMI 19.84 kg/m  , BMI Body mass index is 19.84 kg/m. GEN: Well nourished, well developed, in no acute distress  HEENT: normal  Neck: no JVD, carotid bruits, or masses Cardiac: RRR; no murmurs, rubs, or gallops,no edema  Respiratory:  clear to auscultation bilaterally, normal work of breathing GI: soft,  nontender, nondistended, + BS MS: no deformity or atrophy  Skin: warm and dry, no rash Neuro:  Strength and sensation are intact Psych: euthymic mood, full affect Right radial pulses normal with no hematoma  EKG:  EKG is ordered today. The ekg ordered today demonstrates normal sinus rhythm with left axis deviation.  Old septal infarct.  Inferior T wave changes suggestive of ischemia.   Recent Labs: 03/16/2017: ALT 75 03/18/2017: BUN 12; Creatinine, Ser 1.13; Hemoglobin 11.9; Platelets 129; Potassium 4.2; Sodium 136    Lipid Panel    Component Value Date/Time   CHOL 192 03/09/2017 1900   TRIG 105 03/09/2017 1900   HDL 36 (L) 03/09/2017 1900   CHOLHDL 5.3 03/09/2017 1900   VLDL 21 03/09/2017 1900   LDLCALC 135 (H) 03/09/2017 1900      Wt Readings from Last 3 Encounters:  03/24/17 146 lb 4 oz (66.3 kg)  03/18/17 140 lb 6.4 oz (63.7 kg)  03/11/17 150 lb 12.7 oz (68.4 kg)       No flowsheet data found.    ASSESSMENT AND PLAN:  1.  Recent inferior ST elevation myocardial infarction:  Treated successfully with PCI and 2 drug-eluting stent placement to the RCA.  I stressed the importance of taking medications regularly especially dual antiplatelet therapy. I referred him to cardiac rehab but he reports inability to attend.  I discussed with him the importance of risk factor modification. Patient continues to have significant disease affecting the LAD and ostial left circumflex.  Both vessels are heavily calcified.  The LAD especially severely stenosed and will likely require atherectomy and stent placement but I want to wait for at least one month to ensure recovery from recent myocardial infarction compliance with dual antiplatelet therapy.  2.  Chronic systolic heart failure: He appears to be euvolemic.  I am hoping that his EF will improve in few months.  Continue treatment with carvedilol and lisinopril.  Blood pressure is low and does not allow increasing these medications or adding spironolactone.  3.  Hyperlipidemia: Continue treatment with atorvastatin.  He will need a lipid and liver profile in 1 month.  4.  Tobacco use: I discussed with him the importance of smoking cessation.    Disposition:   FU with me in 1 month  Signed,  Kathlyn Sacramento, MD  03/24/2017 5:16 PM    [place]

## 2017-03-24 NOTE — Telephone Encounter (Signed)
Patient had appointment today with Dr. Fletcher Anon. He needs a one-month follow up with provider. Routed to Support Pool to contact the patient

## 2017-03-24 NOTE — Patient Instructions (Addendum)
Medication Instructions:  Your physician has recommended you make the following change in your medication:  STOP taking omeprazole START taking protonix 40mg  once daily  Refills for atorvastatin, coreg, lisinopril and plavix have been sent to your pharmacy.    Labwork: none  Testing/Procedures: none  Follow-Up: Your physician recommends that you schedule a follow-up appointment in: 1 month with Dr. Fletcher Anon.    Any Other Special Instructions Will Be Listed Below (If Applicable).     If you need a refill on your cardiac medications before your next appointment, please call your pharmacy.

## 2017-03-25 NOTE — Telephone Encounter (Signed)
Lmov to schedule  

## 2017-03-26 ENCOUNTER — Ambulatory Visit: Payer: Medicare Other | Admitting: Physician Assistant

## 2017-04-18 NOTE — Progress Notes (Signed)
Cardiology Office Note    Date:  04/20/2017   ID:  Phillip Warren, DOB March 28, 1955, MRN [id number]  PCP:  Glendon Axe, MD  Cardiologist: Kathlyn Sacramento, MD    Chief Complaint  Patient presents with  . other    1 month follow up s/p stent placement x 2. Meds reviewed by the pt. verbally. Pt. c/o episodes of intermittent chest pain    History of Present Illness:    Phillip Warren is a 62 y.o. male with past medical history of CAD (s/p inferior STEMI in 02/2017 with DES x2 to RCA with early stent thrombosis two days after secondary to medication noncompliance, s/p angioplasty with residual disease along LAD and LCx), chronic combined systolic and diastolic CHF (EF 93-79% by most recent echo), moderate to severe MR, HLD, liver cirrhosis, Hepatitis C and medication noncompliance presents the office today for one-month follow-up.  He was examined by Dr. Fletcher Anon on 03/24/2017 and reported minimal chest pain at that time. He did report compliance with his medication regimen and had decreased his cigarette use to 5 per day. He was continued on his current medication regimen as his blood pressure did not allow for further adjustment of his medications. It was thought that his LAD will likely require atherectomy and stent placement in the near future once he can demonstrate compliance and is further out from his recent ACS event.  In talking with the patient today, he does note occasional twinges of chest pain which only last for a few seconds at a time. These typically occur at rest and resolve without any type of intervention. He denies any symptoms similar to what he experienced in 02/2017. He reports breathing has been at baseline and denies any recent dyspnea on exertion, orthopnea, PND, or lower extremity edema.  He reports good compliance with ASA and Plavix, denies missing any recent doses. No evidence of active bleeding. He did self-discontinue Carvedilol due to reporting fatigue and  dizziness after taking the medication. Continues to smoke between 5-10 cigarettes/day.  Past Medical History:  Diagnosis Date  . Alcohol abuse   . Anorexia    has trouble eating due to poor appetite  . Anxiety    trouble sleeping; lost two sisters and his mother close together  . Arthritis    hands, feet (burning, stinging)  . Cancer [facility])    liver lesion initial staging.  . CHF (congestive heart failure) ([place])   . Cirrhosis of liver ([place])   . Cocaine abuse ([place])   . COPD (chronic obstructive pulmonary disease) ([place])   . Coronary artery disease    Inferior ST elevation myocardial infarction in February 2019.  Cardiac catheterization showed subtotal occlusion of mid right coronary artery with severe disease affecting the LAD and left circumflex.  Successful PCI and 2 drug-eluting stent placement to the RCA.  He returned a few days after hospital discharge with stent thrombosis due to not taking any of his cardiac medications.   Marland Kitchen GERD (gastroesophageal reflux disease)   . H/O dizziness   . Headache   . Hepatitis    B and C (chronic)  . Marijuana use   . Myocardial infarction ([place])    massive MI at age 48  . Neuropathy   . Numbness and tingling    legs and feet bilat   . Pneumonia   . Repeated falls   . Shortness of breath dyspnea   . Tinnitus   . Tobacco abuse     Past  Surgical History:  Procedure Laterality Date  . CARDIAC SURGERY    . COLONOSCOPY WITH PROPOFOL N/A 08/31/2014   Procedure: COLONOSCOPY WITH PROPOFOL;  Surgeon: Josefine Class, MD;  Location: [facility] ENDOSCOPY;  Service: Endoscopy;  Laterality: N/A;  . CORONARY ANGIOPLASTY     at age 81 secondary to massive MI  . CORONARY/GRAFT ACUTE MI REVASCULARIZATION N/A 03/09/2017   Procedure: Coronary/Graft Acute MI Revascularization;  Surgeon: Wellington Hampshire, MD;  Location: [place] CV LAB;  Service: Cardiovascular;  Laterality: N/A;  . CORONARY/GRAFT ACUTE MI REVASCULARIZATION N/A 03/16/2017   Procedure:  Coronary/Graft Acute MI Revascularization;  Surgeon: Wellington Hampshire, MD;  Location: [place] CV LAB;  Service: Cardiovascular;  Laterality: N/A;  balloon only RCA  . ESOPHAGOGASTRODUODENOSCOPY (EGD) WITH PROPOFOL N/A 08/31/2014   Procedure: ESOPHAGOGASTRODUODENOSCOPY (EGD) WITH PROPOFOL;  Surgeon: Josefine Class, MD;  Location[facility] ENDOSCOPY;  Service: Endoscopy;  Laterality: N/A;  . ESOPHAGOGASTRODUODENOSCOPY (EGD) WITH PROPOFOL N/A 01/02/2017   Procedure: ESOPHAGOGASTRODUODENOSCOPY (EGD) WITH PROPOFOL;  Surgeon: Toledo, Benay Pike, MD;  Location: [facility] ENDOSCOPY;  Service: Gastroenterology;  Laterality: N/A;  . LEFT HEART CATH AND CORONARY ANGIOGRAPHY N/A 03/09/2017   Procedure: LEFT HEART CATH AND CORONARY ANGIOGRAPHY;  Surgeon: Wellington Hampshire, MD;  Location: [place] CV LAB;  Service: Cardiovascular;  Laterality: N/A;  . LEFT HEART CATH AND CORONARY ANGIOGRAPHY N/A 03/16/2017   Procedure: LEFT HEART CATH AND CORONARY ANGIOGRAPHY;  Surgeon: Wellington Hampshire, MD;  Location: [place] CV LAB;  Service: Cardiovascular;  Laterality: N/A;    Current Medications: Outpatient Medications Prior to Visit  Medication Sig Dispense Refill  . aspirin 81 MG chewable tablet Chew 1 tablet (81 mg total) by mouth daily. 30 tablet 0  . atorvastatin (LIPITOR) 40 MG tablet Take 1 tablet (40 mg total) by mouth daily at 6 PM. 30 tablet 5  . clopidogrel (PLAVIX) 75 MG tablet Take 1 tablet (75 mg total) by mouth daily with breakfast. 30 tablet 5  . gabapentin (NEURONTIN) 800 MG tablet Take 800 mg by mouth 4 (four) times daily.    Marland Kitchen lisinopril (PRINIVIL,ZESTRIL) 2.5 MG tablet Take 1 tablet (2.5 mg total) by mouth daily. 30 tablet 5  . nitroGLYCERIN (NITROSTAT) 0.4 MG SL tablet Place 0.4 mg under the tongue every 5 (five) minutes as needed for chest pain.     . pantoprazole (PROTONIX) 40 MG tablet Take 1 tablet (40 mg total) by mouth daily. 30 tablet 11  . carvedilol (COREG) 3.125 MG tablet Take 1  tablet (3.125 mg total) by mouth 2 (two) times daily with a meal. 60 tablet 5   No facility-administered medications prior to visit.      Allergies:   Multihance [gadobenate]; Hydrocodone; Other; and Vicodin [hydrocodone-acetaminophen]   Social History   Socioeconomic History  . Marital status: Widowed    Spouse name: Not on file  . Number of children: Not on file  . Years of education: Not on file  . Highest education level: Not on file  Occupational History  . Not on file  Social Needs  . Financial resource strain: Not on file  . Food insecurity:    Worry: Not on file    Inability: Not on file  . Transportation needs:    Medical: Not on file    Non-medical: Not on file  Tobacco Use  . Smoking status: Current Every Day Smoker    Packs/day: 0.50    Years: 30.00    Pack years: 15.00  Types: Cigarettes  . Smokeless tobacco: Never Used  Substance and Sexual Activity  . Alcohol use: No    Frequency: Never    Comment: Daily  . Drug use: Yes    Types: Marijuana, Cocaine    Comment: pt states has not used cocaine in 20 years   . Sexual activity: Not on file  Lifestyle  . Physical activity:    Days per week: Not on file    Minutes per session: Not on file  . Stress: Not on file  Relationships  . Social connections:    Talks on phone: Not on file    Gets together: Not on file    Attends religious service: Not on file    Active member of club or organization: Not on file    Attends meetings of clubs or organizations: Not on file    Relationship status: Not on file  Other Topics Concern  . Not on file  Social History Narrative  . Not on file     Family History:  The patient's family history includes CAD in his father; Lung cancer in his sister.   Review of Systems:   Please see the history of present illness.     General:  No chills, fever, night sweats or weight changes.  Cardiovascular:  No dyspnea on exertion, edema, orthopnea, palpitations, paroxysmal  nocturnal dyspnea. Positive for chest pain (now resolved).  Dermatological: No rash, lesions/masses Respiratory: No cough, dyspnea Urologic: No hematuria, dysuria Abdominal:   No nausea, vomiting, diarrhea, bright red blood per rectum, melena, or hematemesis Neurologic:  No visual changes, wkns, changes in mental status. All other systems reviewed and are otherwise negative except as noted above.   Physical Exam:    VS:  BP 138/80 (BP Location: Left Arm, Patient Position: Sitting, Cuff Size: Normal)   Pulse (!) 103   Ht 6' (1.829 m)   Wt 140 lb 12 oz (63.8 kg)   BMI 19.09 kg/m    General: Well developed, well nourished Caucasian male appearing in no acute distress. Head: Normocephalic, atraumatic, sclera non-icteric, no xanthomas, nares are without discharge.  Neck: No carotid bruits. JVD not elevated.  Lungs: Respirations regular and unlabored, without wheezes or rales.  Heart: Regular rate and rhythm. No S3 or S4.  No rubs or gallops appreciated. 2/6 holosystolic murmur along Apex.  Abdomen: Soft, non-tender, non-distended with normoactive bowel sounds. No hepatomegaly. No rebound/guarding. No obvious abdominal masses. Msk:  Strength and tone appear normal for age. No joint deformities or effusions. Extremities: No clubbing or cyanosis. No lower extremity edema.  Distal pedal pulses are 2+ bilaterally. Radial cath site stable with no ecchymosis or evidence of a hematoma.  Neuro: Alert and oriented X 3. Moves all extremities spontaneously. No focal deficits noted. Psych:  Responds to questions appropriately with a normal affect. Skin: No rashes or lesions noted  Wt Readings from Last 3 Encounters:  04/20/17 140 lb 12 oz (63.8 kg)  03/24/17 146 lb 4 oz (66.3 kg)  03/18/17 140 lb 6.4 oz (63.7 kg)      Studies/Labs Reviewed:   EKG:  EKG is ordered today. The ekg ordered today demonstrates sinus tachycardia, HR 103, with nonspcific ST abnormalities along V1-V2 (similar to prior  tracings).   Recent Labs: 03/16/2017: ALT 75 03/18/2017: BUN 12; Creatinine, Ser 1.13; Hemoglobin 11.9; Platelets 129; Potassium 4.2; Sodium 136   Lipid Panel    Component Value Date/Time   CHOL 192 03/09/2017 1900   TRIG 105 03/09/2017  1900   HDL 36 (L) 03/09/2017 1900   CHOLHDL 5.3 03/09/2017 1900   VLDL 21 03/09/2017 1900   LDLCALC 135 (H) 03/09/2017 1900    Additional studies/ records that were reviewed today include:   Cardiac Catheterization: 03/16/2017  Prox LAD to Mid LAD lesion is 95% stenosed.  Ost Cx to Prox Cx lesion is 80% stenosed.  Prox RCA lesion is 30% stenosed.  Post Atrio lesion is 40% stenosed.  RPDA lesion is 70% stenosed.  Mid RCA-1 lesion is 10% stenosed.  Balloon angioplasty was performed.  Mid RCA-2 lesion is 100% stenosed.  Balloon angioplasty was performed.  Post intervention, there is a 10% residual stenosis.  Post intervention, there is a 10% residual stenosis.  Balloon angioplasty was performed using a BALLOON [place] TREK RX 3.5X8.   1.  Severe underlying heavily calcified three-vessel coronary artery disease with acute RCA stent thrombosis likely due to noncompliance with dual antiplatelet therapy. 2.  Moderately elevated left ventricular end-diastolic pressure. 3.  Successful balloon angioplasty to the right coronary artery.  Difficult procedure due to heavy calcifications and difficulty advancing balloons.  Recommendations: The patient was reloaded with clopidogrel.  Continue dual antiplatelet therapy for at least 12 months.  Aggressive treatment of risk factors.  It is not entirely clear if the patient is going to be a candidate for any further revascularization given compliance issues. Recommend case manager consult to assist with medications. The patient reports inability to afford the medications after he was discharged.  Echocardiogram: 02/2017 Study Conclusions  - Limited study for to evaluate for pericardial effusion. -  Left ventricle: The cavity size was normal. Wall thickness was   increased in a pattern of mild LVH. Systolic function was   severely reduced. The estimated ejection fraction was in the   range of 25% to 30%. There is severe hypokinesis of the   apicalanteroseptal, anterior, and apical myocardium. There is   severe hypokinesis of the inferior and inferoseptal myocardium. - Mitral valve: There was moderate to severe regurgitation. - Pericardium, extracardiac: There was no pericardial effusion.  Impressions:  - Compared with prior echo from last week, LVEF has decreased and   mitral regurgitation has increased.   Assessment:    1. Coronary artery disease involving native coronary artery of native heart with other form of angina pectoris ([place])   2. Chronic systolic heart failure ([place])   3. Hyperlipidemia LDL goal <70   4. Mitral valve insufficiency, unspecified etiology   5. Tobacco use      Plan:   In order of problems listed above:  1. CAD - s/p inferior STEMI in 02/2017 with DES x2 to RCA with early stent thrombosis two days after secondary to medication noncompliance, s/p angioplasty with residual disease along LAD and LCx. - He denies any recurrent exertional chest pain or dyspnea on exertion similar to the symptoms he experienced in 02/2017. He does note occasional episodes of pain only lasting for a few seconds and spontaneously resolving.  EKG today shows no acute changes when compared to prior tracings. - Will touch base with Dr. Fletcher Anon to address consideration of planned PCI to the LAD as previously discussed in prior office notes. - continue ASA, Plavix, BB, and statin therapy. Compliance with ASA and Plavix was again reviewed multiple times during his visit today.   ADDENDUM: Discussed with Dr. Fletcher Anon. The patient will follow-up in 1 month and decisions regarding PCI will be made at that time. Called the patient to inform him  of this (left voice-mail to keep scheduled  follow-up appointment).   2. Chronic Systolic CHF - The patient has a reduced EF of 25-30% by his most recent echocardiogram. - He denies any recent dyspnea on exertion, orthopnea, PND or lower extremity edema and does not appear volume overloaded by physical examination today. - He has quit taking Coreg due to fatigue and dizziness with the medication. Will switch to Toprol-XL 12.5mg  daily. Continue Lisinopril 2.5mg  daily. Would follow BP with beta-blocker switch and further titrate at his next visit if BP allows.   3. HLD - FLP in 02/2017 showed total cholesterol 192, triglycerides 105, HDL 36, and LDL 135. - Will need to return for repeat FLP and LFT's as he is not fasting at the time of today's visit.   4. Mitral Regurgitation - Moderate to severe by most recent echocardiogram. Continue to follow.  5. Tobacco Use - He continues to smoke 0.5 packs per day. Was unable to tolerate Chantix due to side-effects and is unable to afford nicotine patches. We reviewed tips to gradually reduce his tobacco use.    Medication Adjustments/Labs and Tests Ordered: Current medicines are reviewed at length with the patient today.  Concerns regarding medicines are outlined above.  Medication changes, Labs and Tests ordered today are listed in the Patient Instructions below. Patient Instructions  Medication Instructions: - Your physician has recommended you make the following change in your medication:  1) STOP coreg (carvedilol) 2) START toprol (metoprolol succinate) 25 mg- take 1/2 tablet (12.5 mg) by mouth once daily  Labwork: - none ordered  Procedures/Testing: - none ordered  Follow-Up: - Your physician recommends that you schedule a follow-up appointment with Dr. Fletcher Anon: Friday 06/05/17 @ 8:20 am  Any Additional Special Instructions Will Be Listed Below (If Applicable).   If you need a refill on your cardiac medications before your next appointment, please call your pharmacy.    Signed, Erma Heritage, PA-C  04/20/2017 5:18 PM    [facility] [address] [place], [place] [address] Phone: [phone]

## 2017-04-20 ENCOUNTER — Encounter: Payer: Self-pay | Admitting: Student

## 2017-04-20 ENCOUNTER — Ambulatory Visit (INDEPENDENT_AMBULATORY_CARE_PROVIDER_SITE_OTHER): Payer: Medicare Other | Admitting: Student

## 2017-04-20 VITALS — BP 138/80 | HR 103 | Ht 72.0 in | Wt 140.8 lb

## 2017-04-20 DIAGNOSIS — I25118 Atherosclerotic heart disease of native coronary artery with other forms of angina pectoris: Secondary | ICD-10-CM

## 2017-04-20 DIAGNOSIS — I340 Nonrheumatic mitral (valve) insufficiency: Secondary | ICD-10-CM

## 2017-04-20 DIAGNOSIS — I5022 Chronic systolic (congestive) heart failure: Secondary | ICD-10-CM

## 2017-04-20 DIAGNOSIS — E785 Hyperlipidemia, unspecified: Secondary | ICD-10-CM

## 2017-04-20 DIAGNOSIS — Z720 Tobacco use: Secondary | ICD-10-CM | POA: Diagnosis not present

## 2017-04-20 MED ORDER — METOPROLOL SUCCINATE ER 25 MG PO TB24: 12.5000 mg | ORAL_TABLET | Freq: Every day | ORAL | 6 refills | Status: AC

## 2017-04-20 NOTE — Patient Instructions (Signed)
Medication Instructions: - Your physician has recommended you make the following change in your medication:  1) STOP coreg (carvedilol) 2) START toprol (metoprolol succinate) 25 mg- take 1/2 tablet (12.5 mg) by mouth once daily  Labwork: - none ordered  Procedures/Testing: - none ordered  Follow-Up: - Your physician recommends that you schedule a follow-up appointment with Dr. Fletcher Anon: Friday 06/05/17 @ 8:20 am   Any Additional Special Instructions Will Be Listed Below (If Applicable).     If you need a refill on your cardiac medications before your next appointment, please call your pharmacy.

## 2017-06-05 ENCOUNTER — Ambulatory Visit: Payer: Medicare Other | Admitting: Cardiovascular Disease

## 2017-06-05 NOTE — Progress Notes (Deleted)
Cardiology Office Note   Date:  06/05/2017   ID:  Phillip Warren, DOB 01-26-1956, MRN [id number]  PCP:  Glendon Axe, MD  Cardiologist:   Kathlyn Sacramento, MD   No chief complaint on file.     History of Present Illness: Phillip Warren is a 62 y.o. male who presents for follow-up visit regarding coronary artery disease and recent hospitalizations for inferior ST elevation myocardial infarction. He has known history of liver cirrhosis with prior GI bleed, prior alcohol abuse, chronic anorexia, anxiety, severe peripheral neuropathy, previous liver cancer which was treated, previous cocaine use, COPD with tobacco use and hepatitis C. He presented recently to [facility] with acute onset of chest pain and was found to have inferior ST elevation myocardial infarction.  I proceeded with emergent cardiac catheterization which showed subtotal occlusion of the midright coronary artery with heavy thrombus burden and heavily calcified vessels.  There was also severe disease affecting the LAD and ostial left circumflex.  I performed successful angioplasty and 2 overlapped drug-eluting stent placement to the RCA.  The patient returned with acute stent thrombosis few days after hospital discharge due to not taking any of his cardiac medications including aspirin and Plavix.  He was found to have an occluded distal stent which was treated successfully with balloon angioplasty. Echocardiogram showed an EF of 25-30% with severe hypokinesis of anteroseptal, anterior and apical myocardium.  There was also severe hypokinesis of the inferior and inferoseptal myocardium.  There was moderate to severe mitral regurgitation but no pericardial effusion.  Since hospital discharge, he reports feeling significantly better with minimal chest pain.  He reports improvement in shortness of breath.  He is taking his medications regularly now.  He cut down on smoking to about 5 cigarettes a day.  He denies any current alcohol use.   He admits to using marijuana but not as much as before.    Past Medical History:  Diagnosis Date  . Alcohol abuse   . Anorexia    has trouble eating due to poor appetite  . Anxiety    trouble sleeping; lost two sisters and his mother close together  . Arthritis    hands, feet (burning, stinging)  . Cancer [facility])    liver lesion initial staging.  . CHF (congestive heart failure) ([place])   . Cirrhosis of liver ([place])   . Cocaine abuse ([place])   . COPD (chronic obstructive pulmonary disease) ([place])   . Coronary artery disease    Inferior ST elevation myocardial infarction in February 2019.  Cardiac catheterization showed subtotal occlusion of mid right coronary artery with severe disease affecting the LAD and left circumflex.  Successful PCI and 2 drug-eluting stent placement to the RCA.  He returned a few days after hospital discharge with stent thrombosis due to not taking any of his cardiac medications.   Marland Kitchen GERD (gastroesophageal reflux disease)   . H/O dizziness   . Headache   . Hepatitis    B and C (chronic)  . Marijuana use   . Myocardial infarction ([place])    massive MI at age 79  . Neuropathy   . Numbness and tingling    legs and feet bilat   . Pneumonia   . Repeated falls   . Shortness of breath dyspnea   . Tinnitus   . Tobacco abuse     Past Surgical History:  Procedure Laterality Date  . CARDIAC SURGERY    . COLONOSCOPY WITH PROPOFOL N/A 08/31/2014  Procedure: COLONOSCOPY WITH PROPOFOL;  Surgeon: Josefine Class, MD;  Location: [facility] ENDOSCOPY;  Service: Endoscopy;  Laterality: N/A;  . CORONARY ANGIOPLASTY     at age 66 secondary to massive MI  . CORONARY/GRAFT ACUTE MI REVASCULARIZATION N/A 03/09/2017   Procedure: Coronary/Graft Acute MI Revascularization;  Surgeon: Wellington Hampshire, MD;  Location: [place] CV LAB;  Service: Cardiovascular;  Laterality: N/A;  . CORONARY/GRAFT ACUTE MI REVASCULARIZATION N/A 03/16/2017   Procedure: Coronary/Graft Acute MI  Revascularization;  Surgeon: Wellington Hampshire, MD;  Location: [place] CV LAB;  Service: Cardiovascular;  Laterality: N/A;  balloon only RCA  . ESOPHAGOGASTRODUODENOSCOPY (EGD) WITH PROPOFOL N/A 08/31/2014   Procedure: ESOPHAGOGASTRODUODENOSCOPY (EGD) WITH PROPOFOL;  Surgeon: Josefine Class, MD;  Location: [facility] ENDOSCOPY;  Service: Endoscopy;  Laterality: N/A;  . ESOPHAGOGASTRODUODENOSCOPY (EGD) WITH PROPOFOL N/A 01/02/2017   Procedure: ESOPHAGOGASTRODUODENOSCOPY (EGD) WITH PROPOFOL;  Surgeon: Toledo, Benay Pike, MD;  Location: [facility] ENDOSCOPY;  Service: Gastroenterology;  Laterality: N/A;  . LEFT HEART CATH AND CORONARY ANGIOGRAPHY N/A 03/09/2017   Procedure: LEFT HEART CATH AND CORONARY ANGIOGRAPHY;  Surgeon: Wellington Hampshire, MD;  Location: [place] CV LAB;  Service: Cardiovascular;  Laterality: N/A;  . LEFT HEART CATH AND CORONARY ANGIOGRAPHY N/A 03/16/2017   Procedure: LEFT HEART CATH AND CORONARY ANGIOGRAPHY;  Surgeon: Wellington Hampshire, MD;  Location: [place] CV LAB;  Service: Cardiovascular;  Laterality: N/A;     Current Outpatient Medications  Medication Sig Dispense Refill  . aspirin 81 MG chewable tablet Chew 1 tablet (81 mg total) by mouth daily. 30 tablet 0  . atorvastatin (LIPITOR) 40 MG tablet Take 1 tablet (40 mg total) by mouth daily at 6 PM. 30 tablet 5  . clopidogrel (PLAVIX) 75 MG tablet Take 1 tablet (75 mg total) by mouth daily with breakfast. 30 tablet 5  . gabapentin (NEURONTIN) 800 MG tablet Take 800 mg by mouth 4 (four) times daily.    Marland Kitchen lisinopril (PRINIVIL,ZESTRIL) 2.5 MG tablet Take 1 tablet (2.5 mg total) by mouth daily. 30 tablet 5  . metoprolol succinate (TOPROL-XL) 25 MG 24 hr tablet Take 0.5 tablets (12.5 mg total) by mouth daily. 30 tablet 6  . nitroGLYCERIN (NITROSTAT) 0.4 MG SL tablet Place 0.4 mg under the tongue every 5 (five) minutes as needed for chest pain.     . pantoprazole (PROTONIX) 40 MG tablet Take 1 tablet (40 mg total) by mouth  daily. 30 tablet 11   No current facility-administered medications for this visit.     Allergies:   Multihance [gadobenate]; Hydrocodone; Other; and Vicodin [hydrocodone-acetaminophen]    Social History:  The patient  reports that he has been smoking cigarettes.  He has a 15.00 pack-year smoking history. He has never used smokeless tobacco. He reports that he has current or past drug history. Drugs: Marijuana and Cocaine. He reports that he does not drink alcohol.   Family History:  The patient's family history includes CAD in his brother and father; Heart attack in his brother; Heart disease in his brother; Lung cancer in his sister.    ROS:  Please see the history of present illness.   Otherwise, review of systems are positive for none.   All other systems are reviewed and negative.    PHYSICAL EXAM: VS:  There were no vitals taken for this visit. , BMI There is no height or weight on file to calculate BMI. GEN: Well nourished, well developed, in no acute distress  HEENT: normal  Neck:  no JVD, carotid bruits, or masses Cardiac: RRR; no murmurs, rubs, or gallops,no edema  Respiratory:  clear to auscultation bilaterally, normal work of breathing GI: soft, nontender, nondistended, + BS MS: no deformity or atrophy  Skin: warm and dry, no rash Neuro:  Strength and sensation are intact Psych: euthymic mood, full affect Right radial pulses normal with no hematoma  EKG:  EKG is ordered today. The ekg ordered today demonstrates normal sinus rhythm with left axis deviation.  Old septal infarct.  Inferior T wave changes suggestive of ischemia.   Recent Labs: 03/16/2017: ALT 75 03/18/2017: BUN 12; Creatinine, Ser 1.13; Hemoglobin 11.9; Platelets 129; Potassium 4.2; Sodium 136    Lipid Panel    Component Value Date/Time   CHOL 192 03/09/2017 1900   TRIG 105 03/09/2017 1900   HDL 36 (L) 03/09/2017 1900   CHOLHDL 5.3 03/09/2017 1900   VLDL 21 03/09/2017 1900   LDLCALC 135 (H)  03/09/2017 1900      Wt Readings from Last 3 Encounters:  04/20/17 140 lb 12 oz (63.8 kg)  03/24/17 146 lb 4 oz (66.3 kg)  03/18/17 140 lb 6.4 oz (63.7 kg)       No flowsheet data found.    ASSESSMENT AND PLAN:  1.  Recent inferior ST elevation myocardial infarction: Treated successfully with PCI and 2 drug-eluting stent placement to the RCA.  I stressed the importance of taking medications regularly especially dual antiplatelet therapy. I referred him to cardiac rehab but he reports inability to attend.  I discussed with him the importance of risk factor modification. Patient continues to have significant disease affecting the LAD and ostial left circumflex.  Both vessels are heavily calcified.  The LAD especially severely stenosed and will likely require atherectomy and stent placement but I want to wait for at least one month to ensure recovery from recent myocardial infarction compliance with dual antiplatelet therapy.  2.  Chronic systolic heart failure: He appears to be euvolemic.  I am hoping that his EF will improve in few months.  Continue treatment with carvedilol and lisinopril.  Blood pressure is low and does not allow increasing these medications or adding spironolactone.  3.  Hyperlipidemia: Continue treatment with atorvastatin.  He will need a lipid and liver profile in 1 month.  4.  Tobacco use: I discussed with him the importance of smoking cessation.    Disposition:   FU with me in 1 month  Signed,  Kathlyn Sacramento, MD  06/05/2017 8:19 AM    [place]

## 2017-06-08 ENCOUNTER — Encounter: Payer: Self-pay | Admitting: Cardiovascular Disease

## 2017-07-23 ENCOUNTER — Ambulatory Visit: Payer: Medicare Other | Admitting: Cardiovascular Disease

## 2017-07-24 ENCOUNTER — Encounter: Payer: Self-pay | Admitting: Cardiovascular Disease

## 2017-07-24 ENCOUNTER — Ambulatory Visit (INDEPENDENT_AMBULATORY_CARE_PROVIDER_SITE_OTHER): Payer: Medicare Other | Admitting: Cardiovascular Disease

## 2017-07-24 ENCOUNTER — Encounter

## 2017-07-24 VITALS — BP 100/62 | HR 73 | Ht 72.0 in | Wt 144.0 lb

## 2017-07-24 DIAGNOSIS — E785 Hyperlipidemia, unspecified: Secondary | ICD-10-CM | POA: Diagnosis not present

## 2017-07-24 DIAGNOSIS — I5022 Chronic systolic (congestive) heart failure: Secondary | ICD-10-CM | POA: Diagnosis not present

## 2017-07-24 DIAGNOSIS — I10 Essential (primary) hypertension: Secondary | ICD-10-CM | POA: Diagnosis not present

## 2017-07-24 DIAGNOSIS — I2510 Atherosclerotic heart disease of native coronary artery without angina pectoris: Secondary | ICD-10-CM | POA: Diagnosis not present

## 2017-07-24 DIAGNOSIS — Z720 Tobacco use: Secondary | ICD-10-CM | POA: Diagnosis not present

## 2017-07-24 NOTE — Patient Instructions (Signed)
Medication Instructions: Your physician recommends that you continue on your current medications as directed. Please refer to the Current Medication list given to you today.  If you need a refill on your cardiac medications before your next appointment, please call your pharmacy.   Labwork: Your physician recommends that you have a Lipid and Liver drawn today.  Follow-Up: Your physician wants you to follow-up in 4 months with Dr. Fletcher Anon.   Thank you for choosing [facility] at [facility]!

## 2017-07-24 NOTE — Progress Notes (Signed)
Cardiology Office Note   Date:  07/24/2017   ID:  Phillip Warren, DOB 02-26-1955, MRN [id number]  PCP:  Glendon Axe, MD  Cardiologist:   Kathlyn Sacramento, MD   Chief Complaint  Patient presents with  . other    1 month follow up. Meds reviewed by the pt. verbally. "doing well."       History of Present Illness: Phillip Warren is a 62 y.o. male who presents for follow-up visit regarding coronary artery disease. He has known history of liver cirrhosis with prior GI bleed, prior alcohol abuse, chronic anorexia, anxiety, severe peripheral neuropathy, previous liver cancer which was treated, previous cocaine use, COPD with tobacco use and hepatitis C. He had inferior ST elevation myocardial infarction in February of this year.   Emergent cardiac catheterization showed subtotal occlusion of the midright coronary artery with heavy thrombus burden and heavily calcified vessels.  There was also severe disease affecting the LAD and ostial left circumflex.  I performed successful angioplasty and 2 overlapped drug-eluting stent placement to the RCA.  The patient returned with acute stent thrombosis few days after hospital discharge due to not taking any of his cardiac medications including aspirin and Plavix.  He was found to have an occluded distal stent which was treated successfully with balloon angioplasty. Echocardiogram showed an EF of 25-30% with severe hypokinesis of anteroseptal, anterior and apical myocardium.  There was also severe hypokinesis of the inferior and inferoseptal myocardium.  There was moderate to severe mitral regurgitation.  Surprisingly, the patient has actually been doing very well with no reported chest pain or significant dyspnea.  He is now compliant with all his medications.  He continues to smoke but reports no recent drug use.    Past Medical History:  Diagnosis Date  . Alcohol abuse   . Anorexia    has trouble eating due to poor appetite  . Anxiety    trouble sleeping; lost two sisters and his mother close together  . Arthritis    hands, feet (burning, stinging)  . Cancer [facility])    liver lesion initial staging.  . CHF (congestive heart failure) ([place])   . Cirrhosis of liver ([place])   . Cocaine abuse ([place])   . COPD (chronic obstructive pulmonary disease) ([place])   . Coronary artery disease    Inferior ST elevation myocardial infarction in February 2019.  Cardiac catheterization showed subtotal occlusion of mid right coronary artery with severe disease affecting the LAD and left circumflex.  Successful PCI and 2 drug-eluting stent placement to the RCA.  He returned a few days after hospital discharge with stent thrombosis due to not taking any of his cardiac medications.   Marland Kitchen GERD (gastroesophageal reflux disease)   . H/O dizziness   . Headache   . Hepatitis    B and C (chronic)  . Marijuana use   . Myocardial infarction ([place])    massive MI at age 55  . Neuropathy   . Numbness and tingling    legs and feet bilat   . Pneumonia   . Repeated falls   . Shortness of breath dyspnea   . Tinnitus   . Tobacco abuse     Past Surgical History:  Procedure Laterality Date  . CARDIAC SURGERY    . COLONOSCOPY WITH PROPOFOL N/A 08/31/2014   Procedure: COLONOSCOPY WITH PROPOFOL;  Surgeon: Josefine Class, MD;  Location: [facility] ENDOSCOPY;  Service: Endoscopy;  Laterality: N/A;  . CORONARY ANGIOPLASTY  at age 83 secondary to massive MI  . CORONARY/GRAFT ACUTE MI REVASCULARIZATION N/A 03/09/2017   Procedure: Coronary/Graft Acute MI Revascularization;  Surgeon: Wellington Hampshire, MD;  Location: [place] CV LAB;  Service: Cardiovascular;  Laterality: N/A;  . CORONARY/GRAFT ACUTE MI REVASCULARIZATION N/A 03/16/2017   Procedure: Coronary/Graft Acute MI Revascularization;  Surgeon: Wellington Hampshire, MD;  Location: [place] CV LAB;  Service: Cardiovascular;  Laterality: N/A;  balloon only RCA  . ESOPHAGOGASTRODUODENOSCOPY (EGD) WITH PROPOFOL N/A  08/31/2014   Procedure: ESOPHAGOGASTRODUODENOSCOPY (EGD) WITH PROPOFOL;  Surgeon: Josefine Class, MD;  Location: [facility] ENDOSCOPY;  Service: Endoscopy;  Laterality: N/A;  . ESOPHAGOGASTRODUODENOSCOPY (EGD) WITH PROPOFOL N/A 01/02/2017   Procedure: ESOPHAGOGASTRODUODENOSCOPY (EGD) WITH PROPOFOL;  Surgeon: Toledo, Benay Pike, MD;  Location: [facility] ENDOSCOPY;  Service: Gastroenterology;  Laterality: N/A;  . LEFT HEART CATH AND CORONARY ANGIOGRAPHY N/A 03/09/2017   Procedure: LEFT HEART CATH AND CORONARY ANGIOGRAPHY;  Surgeon: Wellington Hampshire, MD;  Location: [place] CV LAB;  Service: Cardiovascular;  Laterality: N/A;  . LEFT HEART CATH AND CORONARY ANGIOGRAPHY N/A 03/16/2017   Procedure: LEFT HEART CATH AND CORONARY ANGIOGRAPHY;  Surgeon: Wellington Hampshire, MD;  Location: [place] CV LAB;  Service: Cardiovascular;  Laterality: N/A;     Current Outpatient Medications  Medication Sig Dispense Refill  . aspirin 81 MG chewable tablet Chew 1 tablet (81 mg total) by mouth daily. 30 tablet 0  . atorvastatin (LIPITOR) 40 MG tablet Take 1 tablet (40 mg total) by mouth daily at 6 PM. 30 tablet 5  . clopidogrel (PLAVIX) 75 MG tablet Take 1 tablet (75 mg total) by mouth daily with breakfast. 30 tablet 5  . gabapentin (NEURONTIN) 800 MG tablet Take 800 mg by mouth 4 (four) times daily.    Marland Kitchen lisinopril (PRINIVIL,ZESTRIL) 2.5 MG tablet Take 1 tablet (2.5 mg total) by mouth daily. 30 tablet 5  . metoprolol succinate (TOPROL-XL) 25 MG 24 hr tablet Take 0.5 tablets (12.5 mg total) by mouth daily. 30 tablet 6  . nitroGLYCERIN (NITROSTAT) 0.4 MG SL tablet Place 0.4 mg under the tongue every 5 (five) minutes as needed for chest pain.     . pantoprazole (PROTONIX) 40 MG tablet Take 1 tablet (40 mg total) by mouth daily. 30 tablet 11   No current facility-administered medications for this visit.     Allergies:   Multihance [gadobenate]; Hydrocodone; Other; and Vicodin [hydrocodone-acetaminophen]    Social  History:  The patient  reports that he has been smoking cigarettes.  He has a 15.00 pack-year smoking history. He has never used smokeless tobacco. He reports that he has current or past drug history. Drugs: Marijuana and Cocaine. He reports that he does not drink alcohol.   Family History:  The patient's family history includes CAD in his brother and father; Heart attack in his brother; Heart disease in his brother; Lung cancer in his sister.    ROS:  Please see the history of present illness.   Otherwise, review of systems are positive for none.   All other systems are reviewed and negative.    PHYSICAL EXAM: VS:  BP 100/62 (BP Location: Left Arm, Patient Position: Sitting, Cuff Size: Normal)   Pulse 73   Ht 6' (1.829 m)   Wt 144 lb (65.3 kg)   BMI 19.53 kg/m  , BMI Body mass index is 19.53 kg/m. GEN: Well nourished, well developed, in no acute distress  HEENT: normal  Neck: no JVD, carotid bruits, or masses  Cardiac: RRR; no murmurs, rubs, or gallops,no edema  Respiratory:  clear to auscultation bilaterally, normal work of breathing GI: soft, nontender, nondistended, + BS MS: no deformity or atrophy  Skin: warm and dry, no rash Neuro:  Strength and sensation are intact Psych: euthymic mood, full affect  EKG:  EKG is ordered today. The ekg ordered today demonstrates normal sinus rhythm with incomplete right bundle branch block.  Possible old septal infarct.  Recent Labs: 03/16/2017: ALT 75 03/18/2017: BUN 12; Creatinine, Ser 1.13; Hemoglobin 11.9; Platelets 129; Potassium 4.2; Sodium 136    Lipid Panel    Component Value Date/Time   CHOL 192 03/09/2017 1900   TRIG 105 03/09/2017 1900   HDL 36 (L) 03/09/2017 1900   CHOLHDL 5.3 03/09/2017 1900   VLDL 21 03/09/2017 1900   LDLCALC 135 (H) 03/09/2017 1900      Wt Readings from Last 3 Encounters:  07/24/17 144 lb (65.3 kg)  04/20/17 140 lb 12 oz (63.8 kg)  03/24/17 146 lb 4 oz (66.3 kg)       No flowsheet data  found.    ASSESSMENT AND PLAN:  1.  Coronary artery disease involving native coronary arteries without angina: He is doing well overall with no anginal symptoms.  I recommend continuing lifelong dual antiplatelet therapy if tolerated.   He continues to have borderline significant disease affecting the LAD and ostial left circumflex.  Both vessels are heavily calcified.   Given his comorbidities and the lack of anginal symptoms, I recommend continued medical therapy for now.  Revascularization can be considered for symptoms.  2.  Chronic systolic heart failure: He appears to be euvolemic.  Continue treatment with Toprol and lisinopril.  Not able to increase the dose due to low blood pressure.  I will consider repeat echocardiogram in the next 6 months.  3.  Hyperlipidemia: Continue treatment with atorvastatin.  I requested lipid and liver profile.  4.  Tobacco use: I discussed with him the importance of smoking cessation.    Disposition:   FU with me in 4 months  Signed,  Kathlyn Sacramento, MD  07/24/2017 11:47 AM    [place]

## 2017-07-25 LAB — HEPATIC FUNCTION PANEL
ALBUMIN: 3.8 g/dL (ref 3.5–5.5)
ALK PHOS: 136 IU/L — AB (ref 39–117)
ALT: 41 IU/L (ref 0–44)
AST: 67 IU/L — ABNORMAL HIGH (ref 0–40)
BILIRUBIN TOTAL: 0.5 mg/dL (ref 0.0–1.2)
Bilirubin, Direct: 0.18 mg/dL (ref 0.00–0.40)
Total Protein: 7.7 g/dL (ref 6.0–8.5)

## 2017-07-25 LAB — LIPID PANEL
CHOL/HDL RATIO: 3.9 ratio (ref 0.0–5.0)
Cholesterol, Total: 147 mg/dL (ref 100–199)
HDL: 38 mg/dL — ABNORMAL LOW (ref >39)
LDL Calculated: 100 mg/dL — ABNORMAL HIGH (ref 0–99)
TRIGLYCERIDES: 46 mg/dL (ref 0–149)
VLDL Cholesterol Cal: 9 mg/dL (ref 5–40)

## 2017-07-31 ENCOUNTER — Telehealth: Payer: Self-pay | Admitting: *Deleted

## 2017-07-31 DIAGNOSIS — E785 Hyperlipidemia, unspecified: Secondary | ICD-10-CM

## 2017-07-31 DIAGNOSIS — I10 Essential (primary) hypertension: Secondary | ICD-10-CM

## 2017-07-31 MED ORDER — ATORVASTATIN CALCIUM 10 MG PO TABS: 10.0000 mg | ORAL_TABLET | Freq: Every day | ORAL | 3 refills | Status: AC

## 2017-07-31 NOTE — Telephone Encounter (Signed)
-----   Message from Wellington Hampshire, MD sent at 07/31/2017  3:00 PM EDT ----- We should resume atorvastatin but at a lower dose of 10 mg once daily which is less likely to cause any leg pain.  No need to add Zetia at this time unless he does not tolerate low-dose atorvastatin.

## 2017-07-31 NOTE — Telephone Encounter (Signed)
Patient made aware of results and verbalized understanding. Atorvastatin 10 mg daily has been sent into the pharmacy. Hepatic and Liver have been ordered for 2 months. Patient will call back if he experiences any leg pain and cannot tolerate the atorvastatin.

## 2017-10-22 ENCOUNTER — Inpatient Hospital Stay: Payer: Medicare Other

## 2017-10-22 ENCOUNTER — Emergency Department: Payer: Medicare Other

## 2017-10-22 ENCOUNTER — Encounter: Payer: Self-pay | Admitting: Emergency Medicine

## 2017-10-22 ENCOUNTER — Other Ambulatory Visit: Payer: Self-pay

## 2017-10-22 ENCOUNTER — Inpatient Hospital Stay
Admission: EM | Admit: 2017-10-22 | Discharge: 2017-10-25 | DRG: 442 | Disposition: A | Discharge status: Home / Self Care | Payer: Medicare Other | Attending: Internal Medicine | Admitting: Internal Medicine

## 2017-10-22 DIAGNOSIS — I110 Hypertensive heart disease with heart failure: Secondary | ICD-10-CM | POA: Diagnosis present

## 2017-10-22 DIAGNOSIS — Z888 Allergy status to other drugs, medicaments and biological substances status: Secondary | ICD-10-CM | POA: Diagnosis not present

## 2017-10-22 DIAGNOSIS — Z885 Allergy status to narcotic agent status: Secondary | ICD-10-CM

## 2017-10-22 DIAGNOSIS — I714 Abdominal aortic aneurysm, without rupture: Secondary | ICD-10-CM | POA: Diagnosis present

## 2017-10-22 DIAGNOSIS — I10 Essential (primary) hypertension: Secondary | ICD-10-CM | POA: Diagnosis not present

## 2017-10-22 DIAGNOSIS — I2510 Atherosclerotic heart disease of native coronary artery without angina pectoris: Secondary | ICD-10-CM | POA: Diagnosis present

## 2017-10-22 DIAGNOSIS — Z79899 Other long term (current) drug therapy: Secondary | ICD-10-CM

## 2017-10-22 DIAGNOSIS — R188 Other ascites: Secondary | ICD-10-CM | POA: Diagnosis present

## 2017-10-22 DIAGNOSIS — F17210 Nicotine dependence, cigarettes, uncomplicated: Secondary | ICD-10-CM | POA: Diagnosis present

## 2017-10-22 DIAGNOSIS — C220 Liver cell carcinoma: Secondary | ICD-10-CM | POA: Diagnosis present

## 2017-10-22 DIAGNOSIS — R1011 Right upper quadrant pain: Secondary | ICD-10-CM

## 2017-10-22 DIAGNOSIS — Z951 Presence of aortocoronary bypass graft: Secondary | ICD-10-CM

## 2017-10-22 DIAGNOSIS — Z7902 Long term (current) use of antithrombotics/antiplatelets: Secondary | ICD-10-CM

## 2017-10-22 DIAGNOSIS — K7460 Unspecified cirrhosis of liver: Secondary | ICD-10-CM | POA: Diagnosis present

## 2017-10-22 DIAGNOSIS — B1920 Unspecified viral hepatitis C without hepatic coma: Secondary | ICD-10-CM | POA: Diagnosis present

## 2017-10-22 DIAGNOSIS — I81 Portal vein thrombosis: Principal | ICD-10-CM | POA: Diagnosis present

## 2017-10-22 DIAGNOSIS — K219 Gastro-esophageal reflux disease without esophagitis: Secondary | ICD-10-CM | POA: Diagnosis present

## 2017-10-22 DIAGNOSIS — B1910 Unspecified viral hepatitis B without hepatic coma: Secondary | ICD-10-CM | POA: Diagnosis present

## 2017-10-22 DIAGNOSIS — J449 Chronic obstructive pulmonary disease, unspecified: Secondary | ICD-10-CM | POA: Diagnosis present

## 2017-10-22 DIAGNOSIS — R1010 Upper abdominal pain, unspecified: Secondary | ICD-10-CM

## 2017-10-22 DIAGNOSIS — I5022 Chronic systolic (congestive) heart failure: Secondary | ICD-10-CM | POA: Diagnosis present

## 2017-10-22 DIAGNOSIS — F1410 Cocaine abuse, uncomplicated: Secondary | ICD-10-CM | POA: Diagnosis present

## 2017-10-22 DIAGNOSIS — F1010 Alcohol abuse, uncomplicated: Secondary | ICD-10-CM | POA: Diagnosis present

## 2017-10-22 DIAGNOSIS — Z7982 Long term (current) use of aspirin: Secondary | ICD-10-CM | POA: Diagnosis not present

## 2017-10-22 DIAGNOSIS — K8050 Calculus of bile duct without cholangitis or cholecystitis without obstruction: Secondary | ICD-10-CM

## 2017-10-22 DIAGNOSIS — K8020 Calculus of gallbladder without cholecystitis without obstruction: Secondary | ICD-10-CM | POA: Diagnosis present

## 2017-10-22 DIAGNOSIS — I252 Old myocardial infarction: Secondary | ICD-10-CM | POA: Diagnosis not present

## 2017-10-22 DIAGNOSIS — K8000 Calculus of gallbladder with acute cholecystitis without obstruction: Secondary | ICD-10-CM

## 2017-10-22 DIAGNOSIS — K769 Liver disease, unspecified: Secondary | ICD-10-CM | POA: Diagnosis not present

## 2017-10-22 DIAGNOSIS — I255 Ischemic cardiomyopathy: Secondary | ICD-10-CM | POA: Diagnosis not present

## 2017-10-22 DIAGNOSIS — R748 Abnormal levels of other serum enzymes: Secondary | ICD-10-CM

## 2017-10-22 DIAGNOSIS — R109 Unspecified abdominal pain: Secondary | ICD-10-CM | POA: Diagnosis present

## 2017-10-22 LAB — COMPREHENSIVE METABOLIC PANEL
ALBUMIN: 3.2 g/dL — AB (ref 3.5–5.0)
ALT: 59 U/L — ABNORMAL HIGH (ref 0–44)
ANION GAP: 8 (ref 5–15)
AST: 186 U/L — ABNORMAL HIGH (ref 15–41)
Alkaline Phosphatase: 209 U/L — ABNORMAL HIGH (ref 38–126)
BUN: 13 mg/dL (ref 6–20)
CO2: 23 mmol/L (ref 22–32)
Calcium: 8.8 mg/dL — ABNORMAL LOW (ref 8.9–10.3)
Chloride: 101 mmol/L (ref 98–111)
Creatinine, Ser: 1.04 mg/dL (ref 0.61–1.24)
GFR calc Af Amer: >60 mL/min (ref >60)
GFR calc non Af Amer: >60 mL/min (ref >60)
GLUCOSE: 125 mg/dL — AB (ref 70–99)
Potassium: 4.3 mmol/L (ref 3.5–5.1)
Sodium: 132 mmol/L — ABNORMAL LOW (ref 135–145)
Total Bilirubin: 1.3 mg/dL — ABNORMAL HIGH (ref 0.3–1.2)
Total Protein: 7.9 g/dL (ref 6.5–8.1)

## 2017-10-22 LAB — URINALYSIS, COMPLETE (UACMP) WITH MICROSCOPIC
BACTERIA UA: NONE SEEN
Bilirubin Urine: NEGATIVE
Glucose, UA: NEGATIVE mg/dL
Hgb urine dipstick: NEGATIVE
KETONES UR: NEGATIVE mg/dL
Leukocytes, UA: NEGATIVE
NITRITE: NEGATIVE
PH: 7 (ref 5.0–8.0)
Protein, ur: NEGATIVE mg/dL
Specific Gravity, Urine: 1.004 — ABNORMAL LOW (ref 1.005–1.030)

## 2017-10-22 LAB — CBC
HEMATOCRIT: 36.2 % — AB (ref 40.0–52.0)
HEMOGLOBIN: 12.2 g/dL — AB (ref 13.0–18.0)
MCH: 28.7 pg (ref 26.0–34.0)
MCHC: 33.6 g/dL (ref 32.0–36.0)
MCV: 85.4 fL (ref 80.0–100.0)
Platelets: 167 10*3/uL (ref 150–440)
RBC: 4.24 MIL/uL — ABNORMAL LOW (ref 4.40–5.90)
RDW: 19.7 % — ABNORMAL HIGH (ref 11.5–14.5)
WBC: 4 10*3/uL (ref 3.8–10.6)

## 2017-10-22 LAB — DIFFERENTIAL
BASOS ABS: 0 10*3/uL (ref 0–0.1)
BASOS PCT: 1 %
Eosinophils Absolute: 0.4 10*3/uL (ref 0–0.7)
Eosinophils Relative: 10 %
Lymphocytes Relative: 25 %
Lymphs Abs: 1.1 10*3/uL (ref 1.0–3.6)
MONOS PCT: 10 %
Monocytes Absolute: 0.4 10*3/uL (ref 0.2–1.0)
Neutro Abs: 2.4 10*3/uL (ref 1.4–6.5)
Neutrophils Relative %: 54 %

## 2017-10-22 LAB — PROTIME-INR
INR: 1.25
Prothrombin Time: 15.6 seconds — ABNORMAL HIGH (ref 11.4–15.2)

## 2017-10-22 LAB — APTT: APTT: 32 s (ref 24–36)

## 2017-10-22 LAB — LIPASE, BLOOD: Lipase: 52 U/L — ABNORMAL HIGH (ref 11–51)

## 2017-10-22 MED ORDER — HEPARIN (PORCINE) IN NACL 100-0.45 UNIT/ML-% IJ SOLN
1400.0000 [IU]/h | INTRAMUSCULAR | Status: DC
  Administered 2017-10-22: 1150 [IU]/h via INTRAVENOUS
  Administered 2017-10-23 – 2017-10-25 (×3): 1400 [IU]/h via INTRAVENOUS
  Filled 2017-10-22 (×4): qty 250

## 2017-10-22 MED ORDER — HYDROMORPHONE HCL 1 MG/ML IJ SOLN
0.5000 mg | Freq: Once | INTRAMUSCULAR | Status: AC
  Administered 2017-10-22: 0.5 mg via INTRAVENOUS
  Filled 2017-10-22: qty 1

## 2017-10-22 MED ORDER — SODIUM CHLORIDE 0.9 % IV SOLN
1.0000 g | Freq: Once | INTRAVENOUS | Status: DC
  Filled 2017-10-22: qty 1

## 2017-10-22 MED ORDER — ONDANSETRON HCL 4 MG/2ML IJ SOLN
4.0000 mg | Freq: Once | INTRAMUSCULAR | Status: AC
  Administered 2017-10-22: 4 mg via INTRAVENOUS
  Filled 2017-10-22: qty 2

## 2017-10-22 MED ORDER — MORPHINE SULFATE (PF) 2 MG/ML IV SOLN
2.0000 mg | Freq: Once | INTRAVENOUS | Status: AC
  Administered 2017-10-22: 2 mg via INTRAVENOUS
  Filled 2017-10-22: qty 1

## 2017-10-22 MED ORDER — PIPERACILLIN-TAZOBACTAM 3.375 G IVPB
3.3750 g | Freq: Three times a day (TID) | INTRAVENOUS | Status: DC
  Administered 2017-10-22 – 2017-10-24 (×5): 3.375 g via INTRAVENOUS
  Filled 2017-10-22 (×5): qty 50

## 2017-10-22 MED ORDER — HEPARIN SODIUM (PORCINE) 5000 UNIT/ML IJ SOLN
4000.0000 [IU] | Freq: Once | INTRAMUSCULAR | Status: AC
  Administered 2017-10-22: 4000 [IU] via INTRAVENOUS

## 2017-10-22 NOTE — ED Notes (Signed)
Patient transported to MRI 

## 2017-10-22 NOTE — ED Notes (Signed)
Patient transported to Ultrasound 

## 2017-10-22 NOTE — Progress Notes (Signed)
ANTICOAGULATION CONSULT NOTE - Initial Consult  Pharmacy Consult for Heparin Drip Indication: Portal Vein Thrombosis  Allergies  Allergen Reactions  . Multihance [Gadobenate] Hives and Shortness Of Breath  . Hydrocodone Itching and Nausea And Vomiting  . Other Itching    multihance MRI dye  . Vicodin [Hydrocodone-Acetaminophen] Nausea Only and Nausea And Vomiting    Vomiting     Patient Measurements: Height: 6' (182.9 cm) Weight: 150 lb (68 kg) IBW/kg (Calculated) : 77.6 Heparin Dosing Weight: 68 kg  Vital Signs: Temp: 97.8 F (36.6 C) (09/26 1118) Temp Source: Oral (09/26 1118) BP: 123/79 (09/26 1945) Pulse Rate: 70 (09/26 1945)  Labs: Recent Labs    10/22/17 1131  HGB 12.2*  HCT 36.2*  PLT 167  CREATININE 1.04    Estimated Creatinine Clearance: 73.6 mL/min (by C-G formula based on SCr of 1.04 mg/dL).   Medical History: Past Medical History:  Diagnosis Date  . Alcohol abuse   . Anorexia    has trouble eating due to poor appetite  . Anxiety    trouble sleeping; lost two sisters and his mother close together  . Arthritis    hands, feet (burning, stinging)  . Cancer [facility])    liver lesion initial staging.  . CHF (congestive heart failure) ([place])   . Cirrhosis of liver ([place])   . Cocaine abuse ([place])   . COPD (chronic obstructive pulmonary disease) ([place])   . Coronary artery disease    Inferior ST elevation myocardial infarction in February 2019.  Cardiac catheterization showed subtotal occlusion of mid right coronary artery with severe disease affecting the LAD and left circumflex.  Successful PCI and 2 drug-eluting stent placement to the RCA.  He returned a few days after hospital discharge with stent thrombosis due to not taking any of his cardiac medications.   Marland Kitchen GERD (gastroesophageal reflux disease)   . H/O dizziness   . Headache   . Hepatitis    B and C (chronic)  . Marijuana use   . Myocardial infarction ([place])    massive MI at age 80  . Neuropathy    . Numbness and tingling    legs and feet bilat   . Pneumonia   . Repeated falls   . Shortness of breath dyspnea   . Tinnitus   . Tobacco abuse     Assessment: Patient is a 62yo male admitted with possible portal vein thrombosis. Pharmacy consulted for Heparin dosing.  Goal of Therapy:  Heparin level 0.3-0.7 units/ml Monitor platelets by anticoagulation protocol: Yes   Plan:  Give 4000 units bolus x 1 Start heparin infusion at 1150 units/hr Check anti-Xa level in 6 hours and daily while on heparin Continue to monitor H&H and platelets  Paulina Fusi, PharmD, BCPS 10/22/2017 9:26 PM

## 2017-10-22 NOTE — ED Notes (Signed)
Patient transported to X-ray 

## 2017-10-22 NOTE — ED Triage Notes (Addendum)
Patient reports worsening abdominal pain and swelling in abdomen x2 weeks. States he has gained almost 20 lbs in 3 weeks. States "I can't even button my pants anymore." Reports nausea and vomiting but denies diarrhea. Reports chills but unsure of fever. Patient also reports it is painful to breathe. States he noticed a rash on his abdomen last week. History of esophogeal varices.

## 2017-10-22 NOTE — ED Provider Notes (Addendum)
[facility] Emergency Department Provider Note   ____________________________________________   First MD Initiated Contact with Patient 10/22/17 1617     (approximate)  I have reviewed the triage vital signs and the nursing notes.   HISTORY  Chief Complaint Abdominal Pain and Shortness of Breath   HPI Phillip Warren is a 62 y.o. male comes in complaining of abdominal pain and distention shortness of breath.  He reports he cannot button his pants any longer.  Is been going on for about a week or 2.  He says he had a recent hospital admission for vomiting blood but I cannot find that in the computer.  He did have an admission for an MI back in February.  Patient reports sharp stabbing pain in his right upper quadrant of his abdomen.  His LFTs are elevated.  He has a history of cirrhosis alcoholism and hepatitis C.   Past Medical History:  Diagnosis Date  . Alcohol abuse   . Anorexia    has trouble eating due to poor appetite  . Anxiety    trouble sleeping; lost two sisters and his mother close together  . Arthritis    hands, feet (burning, stinging)  . Cancer [facility])    liver lesion initial staging.  . CHF (congestive heart failure) ([place])   . Cirrhosis of liver ([place])   . Cocaine abuse ([place])   . COPD (chronic obstructive pulmonary disease) ([place])   . Coronary artery disease    Inferior ST elevation myocardial infarction in February 2019.  Cardiac catheterization showed subtotal occlusion of mid right coronary artery with severe disease affecting the LAD and left circumflex.  Successful PCI and 2 drug-eluting stent placement to the RCA.  He returned a few days after hospital discharge with stent thrombosis due to not taking any of his cardiac medications.   Marland Kitchen GERD (gastroesophageal reflux disease)   . H/O dizziness   . Headache   . Hepatitis    B and C (chronic)  . Marijuana use   . Myocardial infarction ([place])    massive MI at age 47  . Neuropathy     . Numbness and tingling    legs and feet bilat   . Pneumonia   . Repeated falls   . Shortness of breath dyspnea   . Tinnitus   . Tobacco abuse     Patient Active Problem List   Diagnosis Date Noted  . COPD (chronic obstructive pulmonary disease) ([place]) 03/09/2017  . GERD (gastroesophageal reflux disease) 03/09/2017  . Alcohol abuse 03/09/2017  . History of GI bleed 03/09/2017  . Acute ST elevation myocardial infarction (STEMI) of inferior wall ([place]) 03/09/2017  . Acute ST elevation myocardial infarction (STEMI) involving right coronary artery ([place])   . GI bleed 01/02/2017  . Hepatic cirrhosis ([place])   . Hepatocellular carcinoma ([place])   . [place] (hepatocellular carcinoma) ([place])   . Hepatitis, viral   . Cancer, hepatocellular ([place]) 08/31/2014  . Absolute anemia 08/26/2014  . Cirrhosis ([place]) 08/26/2014  . Arteriosclerosis of coronary artery 08/26/2014  . HBV (hepatitis B virus) infection 08/26/2014  . HCV (hepatitis C virus) 08/26/2014  . Chronic hepatitis C ([place]) 04/13/2014  . Foot pain 04/13/2014    Past Surgical History:  Procedure Laterality Date  . CARDIAC SURGERY    . COLONOSCOPY WITH PROPOFOL N/A 08/31/2014   Procedure: COLONOSCOPY WITH PROPOFOL;  Surgeon: Josefine Class, MD;  [facility] ENDOSCOPY;  Service: Endoscopy;  Laterality: N/A;  . CORONARY  ANGIOPLASTY     at age 67 secondary to massive MI  . CORONARY/GRAFT ACUTE MI REVASCULARIZATION N/A 03/09/2017   Procedure: Coronary/Graft Acute MI Revascularization;  Surgeon: Wellington Hampshire, MD;  Location: [place] CV LAB;  Service: Cardiovascular;  Laterality: N/A;  . CORONARY/GRAFT ACUTE MI REVASCULARIZATION N/A 03/16/2017   Procedure: Coronary/Graft Acute MI Revascularization;  Surgeon: Wellington Hampshire, MD;  Location: [place] CV LAB;  Service: Cardiovascular;  Laterality: N/A;  balloon only RCA  . ESOPHAGOGASTRODUODENOSCOPY (EGD) WITH PROPOFOL N/A 08/31/2014   Procedure: ESOPHAGOGASTRODUODENOSCOPY (EGD)  WITH PROPOFOL;  Surgeon: Josefine Class, MD;  Location: [facility] ENDOSCOPY;  Service: Endoscopy;  Laterality: N/A;  . ESOPHAGOGASTRODUODENOSCOPY (EGD) WITH PROPOFOL N/A 01/02/2017   Procedure: ESOPHAGOGASTRODUODENOSCOPY (EGD) WITH PROPOFOL;  Surgeon: [place], Benay Pike, MD;  Location: [facility] ENDOSCOPY;  Service: Gastroenterology;  Laterality: N/A;  . LEFT HEART CATH AND CORONARY ANGIOGRAPHY N/A 03/09/2017   Procedure: LEFT HEART CATH AND CORONARY ANGIOGRAPHY;  Surgeon: Wellington Hampshire, MD;  Location: [place] CV LAB;  Service: Cardiovascular;  Laterality: N/A;  . LEFT HEART CATH AND CORONARY ANGIOGRAPHY N/A 03/16/2017   Procedure: LEFT HEART CATH AND CORONARY ANGIOGRAPHY;  Surgeon: Wellington Hampshire, MD;  Location: [place] CV LAB;  Service: Cardiovascular;  Laterality: N/A;    Prior to Admission medications   Medication Sig Start Date End Date Taking? Authorizing Provider  aspirin 81 MG chewable tablet Chew 1 tablet (81 mg total) by mouth daily. 03/19/17  Yes [name], MD  atorvastatin (LIPITOR) 10 MG tablet Take 1 tablet (10 mg total) by mouth daily at 6 PM. 07/31/17  Yes Wellington Hampshire, MD  clopidogrel (PLAVIX) 75 MG tablet Take 1 tablet (75 mg total) by mouth daily with breakfast. 03/24/17  Yes Wellington Hampshire, MD  gabapentin (NEURONTIN) 800 MG tablet Take 800 mg by mouth 4 (four) times daily.   Yes [provider]  lisinopril (PRINIVIL,ZESTRIL) 2.5 MG tablet Take 1 tablet (2.5 mg total) by mouth daily. 03/24/17  Yes Wellington Hampshire, MD  metoprolol succinate (TOPROL-XL) 25 MG 24 hr tablet Take 0.5 tablets (12.5 mg total) by mouth daily. 04/20/17  Yes Strader, [place] M, PA-C  pantoprazole (PROTONIX) 40 MG tablet Take 1 tablet (40 mg total) by mouth daily. 03/24/17  Yes Wellington Hampshire, MD  nitroGLYCERIN (NITROSTAT) 0.4 MG SL tablet Place 0.4 mg under the tongue every 5 (five) minutes as needed for chest pain.  05/10/13   [provider]    Allergies Multihance  [gadobenate]; Hydrocodone; Other; and Vicodin [hydrocodone-acetaminophen]  Family History  Problem Relation Age of Onset  . CAD Father   . Lung cancer Sister   . CAD Brother   . Heart disease Brother        CABG x 3  . Heart attack Brother        nine heart attacks  . Prostate cancer Neg Hx   . Bladder Cancer Neg Hx   . Kidney cancer Neg Hx     Social History Social History   Tobacco Use  . Smoking status: Current Every Day Smoker    Packs/day: 0.50    Years: 30.00    Pack years: 15.00    Types: Cigarettes  . Smokeless tobacco: Never Used  Substance Use Topics  . Alcohol use: Yes    Frequency: Never    Comment: "very little"  . Drug use: Yes    Types: Marijuana, Cocaine    Comment: pt states has not used  cocaine in 20 years     Review of Systems  Constitutional: No fever/chills Eyes: No visual changes. ENT: No sore throat. Cardiovascular: Denies chest pain. Respiratory: Denies shortness of breath. Gastrointestinal:  abdominal pain.   nausea,  vomiting.  No diarrhea.  No constipation. Genitourinary: Negative for dysuria. Musculoskeletal: Negative for back pain. Skin: Negative for rash. Neurological: Negative for headaches, focal weakness   ____________________________________________   PHYSICAL EXAM:  VITAL SIGNS: ED Triage Vitals  Enc Vitals Group     BP 10/22/17 1118 120/74     Pulse Rate 10/22/17 1118 97     Resp 10/22/17 1118 18     Temp 10/22/17 1118 97.8 F (36.6 C)     Temp Source 10/22/17 1118 Oral     SpO2 10/22/17 1118 97 %     Weight 10/22/17 1114 150 lb (68 kg)     Height 10/22/17 1114 6' (1.829 m)     Head Circumference --      Peak Flow --      Pain Score 10/22/17 1118 8     Pain Loc --      Pain Edu? --      Excl. in [place]? --    Constitutional: Alert and oriented.  Cachectic and chronically ill-appearing but in no acute distress. Eyes: Conjunctivae are normal.  Head: Atraumatic. Nose: No congestion/rhinnorhea. Mouth/Throat:  Mucous membranes are moist.  Oropharynx non-erythematous. Neck: No stridor. Cardiovascular: Normal rate, regular rhythm. Grossly normal heart sounds.  Good peripheral circulation. Respiratory: Normal respiratory effort.  No retractions. Lungs CTAB. Gastrointestinal: Distended and soft slightly tender in the right upper quadrant. No distention. No abdominal bruits. No CVA tenderness. Musculoskeletal: No lower extremity tenderness nor edema.   Neurologic:  Normal speech and language. No gross focal neurologic deficits are appreciated. No gait instability. Skin:  Skin is warm, dry and intact. No rash noted. Psychiatric: Mood and affect are normal. Speech and behavior are normal.  ____________________________________________   LABS (all labs ordered are listed, but only abnormal results are displayed)  Labs Reviewed  LIPASE, BLOOD - Abnormal; Notable for the following components:      Result Value   Lipase 52 (*)    All other components within normal limits  COMPREHENSIVE METABOLIC PANEL - Abnormal; Notable for the following components:   Sodium 132 (*)    Glucose, Bld 125 (*)    Calcium 8.8 (*)    Albumin 3.2 (*)    AST 186 (*)    ALT 59 (*)    Alkaline Phosphatase 209 (*)    Total Bilirubin 1.3 (*)    All other components within normal limits  CBC - Abnormal; Notable for the following components:   RBC 4.24 (*)    Hemoglobin 12.2 (*)    HCT 36.2 (*)    RDW 19.7 (*)    All other components within normal limits  URINALYSIS, COMPLETE (UACMP) WITH MICROSCOPIC  DIFFERENTIAL  PROTIME-INR  APTT   ____________________________________________  EKG  EKG read interpreted by me shows sinus tachycardia rate of 102 normal axis no acute changes seen ____________________________________________  RADIOLOGY  ED MD interpretation: Ultrasound shows gallstones and sludge filling the gallbladder with a thickened bowel wall.  There is no ultrasound Murphy sign however patient is having pain  in that area regularly.  Forward vein is also thrombosed and there is some ascites.  Official radiology report(s): Dg Chest 2 View  Result Date: 10/22/2017 CLINICAL DATA:  Abdominal pain and swelling 2 weeks with  20 pound weight gain. Pleuritic chest pain. EXAM: CHEST - 2 VIEW COMPARISON:  03/16/2017 and 01/02/2017 FINDINGS: Lungs are adequately inflated without consolidation or effusion. Cardiomediastinal silhouette and remainder of the exam is unchanged. IMPRESSION: No active cardiopulmonary disease. Electronically Signed   By: Marin Olp M.D.   On: 10/22/2017 17:20   US Abdomen Limited Ruq  Result Date: 10/22/2017 CLINICAL DATA:  Elevated LFTs, right upper quadrant pain EXAM: ULTRASOUND ABDOMEN LIMITED RIGHT UPPER QUADRANT COMPARISON:  CT 11/11/2014 FINDINGS: Gallbladder: Multiple stones within the gallbladder, the largest 7 mm. Sludge fills the gallbladder. Wall is thickened at 9 mm. No tenderness reported over the gallbladder during the study. Common bile duct: Diameter: Normal caliber, 4 mm Liver: The main portal vein is distended and appears thrombosed. Focal hepatic lesion posteriorly within the right hepatic lobe measures 5.2 x 3.3 x 5.0 cm. Heterogeneous echotexture throughout the liver. Small amount of adjacent ascites. IMPRESSION: Cholelithiasis and sludge within the gallbladder. Gallbladder wall is markedly thickened measuring 9 mm. Cannot exclude cholecystitis. Portal vein thrombosis. Heterogeneous echotexture throughout the liver with focal 5.2 cm hypoechoic solid lesion in the posterior right hepatic lobe. Recommend further evaluation with liver protocol MRI. Small amount of perihepatic ascites. Electronically Signed   By: Rolm Baptise M.D.   On: 10/22/2017 18:31    ____________________________________________   PROCEDURES  Procedure(s) performed:   Procedures  Critical Care performed: Critical care time 45 minutes.  I discussed the patient with Dr. Adora Fridge who requested an  ERCP.  I called on-call GI.  Dr. Vicente Males is on-call now he says that the patient with a normal bile duct diameter does not need an ERCP but he can get an MRCP.  This was ordered.  I then discussed the patient with vascular surgery as Dr. Vicente Males also suggested.  Vascular surgery said only heparin for this nothing else to do for the portal vein thrombosis.  This is been ordered.  I then discussed the patient with hospitalist who will admit the patient.  Dr. Adora Fridge is still in surgery at this time.  ____________________________________________   INITIAL IMPRESSION / ASSESSMENT AND PLAN / ED COURSE  Please see critical care note for the medical decision making.        ____________________________________________   FINAL CLINICAL IMPRESSION(S) / ED DIAGNOSES  Final diagnoses:  Pain of upper abdomen  Portal vein thrombosis  Biliary colic  Patient has a very good chance of actually having cholecystitis not biliary colic.   ED Discharge Orders    None       Note:  This document was prepared using Dragon voice recognition software and may include unintentional dictation errors.    Nena Polio, MD 10/22/17 2050    Nena Polio, MD 11/10/17 1003

## 2017-10-22 NOTE — H&P (Signed)
[place] [facility] [place] NAME: Phillip Warren    MR#:  [id number]  DATE OF BIRTH:  1955-04-30  DATE OF ADMISSION:  10/22/2017  PRIMARY CARE PHYSICIAN: Glendon Axe, MD   REQUESTING/REFERRING PHYSICIAN:   CHIEF COMPLAINT:   Chief Complaint  Patient presents with  . Abdominal Pain  . Shortness of Breath    HISTORY OF PRESENT ILLNESS: Phillip Warren  is a 62 y.o. male with a known history of liver cirrhosis, HCC s/p percutaneous thermal ablation in 10/2014, hepatitis B and C, tobacco abuse, cocaine abuse and alcohol abuse. Patient presented to the hospital for abdominal pain and distention associated with shortness of breath, going on for 1 to 2 weeks, gradually getting worse.  He complains of severe, sharp, stabbing pain in his right upper quadrant, without radiation.  No fever, chills, bleeding, no similar episodes in the past. Blood test done emergency room show elevated AST at 186 and ALT at 59, alk phos is 209. Abdominal ultrasound shows cholelithiasis and sludge within the gallbladder. Gallbladder wall is markedly thickened measuring 9 mm. Cannot exclude cholecystitis. Portal vein thrombosis. Heterogeneous echotexture throughout the liver with focal 5.2 cm hypoechoic solid lesion in the posterior right hepatic lobe. Small amount of perihepatic ascites.  Patient is admitted for further evaluation and treatment.  PAST MEDICAL HISTORY:   Past Medical History:  Diagnosis Date  . Alcohol abuse   . Anorexia    has trouble eating due to poor appetite  . Anxiety    trouble sleeping; lost two sisters and his mother close together  . Arthritis    hands, feet (burning, stinging)  . Cancer [facility])    liver lesion initial staging.  . CHF (congestive heart failure) ([place])   . Cirrhosis of liver ([place])   . Cocaine abuse ([place])   . COPD (chronic obstructive pulmonary disease) ([place])   . Coronary artery disease    Inferior ST elevation myocardial  infarction in February 2019.  Cardiac catheterization showed subtotal occlusion of mid right coronary artery with severe disease affecting the LAD and left circumflex.  Successful PCI and 2 drug-eluting stent placement to the RCA.  He returned a few days after hospital discharge with stent thrombosis due to not taking any of his cardiac medications.   Marland Kitchen GERD (gastroesophageal reflux disease)   . H/O dizziness   . Headache   . Hepatitis    B and C (chronic)  . Marijuana use   . Myocardial infarction ([place])    massive MI at age 33  . Neuropathy   . Numbness and tingling    legs and feet bilat   . Pneumonia   . Repeated falls   . Shortness of breath dyspnea   . Tinnitus   . Tobacco abuse     PAST SURGICAL HISTORY:  Past Surgical History:  Procedure Laterality Date  . CARDIAC SURGERY    . COLONOSCOPY WITH PROPOFOL N/A 08/31/2014   Procedure: COLONOSCOPY WITH PROPOFOL;  Surgeon: Josefine Class, MD;  Location: [facility] ENDOSCOPY;  Service: Endoscopy;  Laterality: N/A;  . CORONARY ANGIOPLASTY     at age 42 secondary to massive MI  . CORONARY/GRAFT ACUTE MI REVASCULARIZATION N/A 03/09/2017   Procedure: Coronary/Graft Acute MI Revascularization;  Surgeon: Wellington Hampshire, MD;  Location: [place] CV LAB;  Service: Cardiovascular;  Laterality: N/A;  . CORONARY/GRAFT ACUTE MI REVASCULARIZATION N/A 03/16/2017   Procedure: Coronary/Graft Acute MI Revascularization;  Surgeon: Wellington Hampshire, MD;  Location: [employer] CV LAB;  Service: Cardiovascular;  Laterality: N/A;  balloon only RCA  . ESOPHAGOGASTRODUODENOSCOPY (EGD) WITH PROPOFOL N/A 08/31/2014   Procedure: ESOPHAGOGASTRODUODENOSCOPY (EGD) WITH PROPOFOL;  Surgeon: Josefine Class, MD;  Location[facility] ENDOSCOPY;  Service: Endoscopy;  Laterality: N/A;  . ESOPHAGOGASTRODUODENOSCOPY (EGD) WITH PROPOFOL N/A 01/02/2017   Procedure: ESOPHAGOGASTRODUODENOSCOPY (EGD) WITH PROPOFOL;  Surgeon: Toledo, Benay Pike, MD;  Location: [facility] ENDOSCOPY;   Service: Gastroenterology;  Laterality: N/A;  . LEFT HEART CATH AND CORONARY ANGIOGRAPHY N/A 03/09/2017   Procedure: LEFT HEART CATH AND CORONARY ANGIOGRAPHY;  Surgeon: Wellington Hampshire, MD;  Location: [place] CV LAB;  Service: Cardiovascular;  Laterality: N/A;  . LEFT HEART CATH AND CORONARY ANGIOGRAPHY N/A 03/16/2017   Procedure: LEFT HEART CATH AND CORONARY ANGIOGRAPHY;  Surgeon: Wellington Hampshire, MD;  Location: [place] CV LAB;  Service: Cardiovascular;  Laterality: N/A;    SOCIAL HISTORY:  Social History   Tobacco Use  . Smoking status: Current Every Day Smoker    Packs/day: 0.50    Years: 30.00    Pack years: 15.00    Types: Cigarettes  . Smokeless tobacco: Never Used  Substance Use Topics  . Alcohol use: Yes    Frequency: Never    Comment: "very little"    FAMILY HISTORY:  Family History  Problem Relation Age of Onset  . CAD Father   . Lung cancer Sister   . CAD Brother   . Heart disease Brother        CABG x 3  . Heart attack Brother        nine heart attacks  . Prostate cancer Neg Hx   . Bladder Cancer Neg Hx   . Kidney cancer Neg Hx     DRUG ALLERGIES:  Allergies  Allergen Reactions  . Multihance [Gadobenate] Hives and Shortness Of Breath  . Hydrocodone Itching and Nausea And Vomiting  . Other Itching    multihance MRI dye  . Vicodin [Hydrocodone-Acetaminophen] Nausea Only and Nausea And Vomiting    Vomiting     REVIEW OF SYSTEMS:   CONSTITUTIONAL: No fever, fatigue or weakness.  EYES: No changes in vision.  EARS, NOSE, AND THROAT: No tinnitus or ear pain.  RESPIRATORY: Positive for shortness of breath with exertion.  No cough, wheezing or hemoptysis.  CARDIOVASCULAR: No chest pain, orthopnea, edema.  GASTROINTESTINAL: Positive for right upper quadrant pain and abdominal distention.  No nausea, vomiting, diarrhea.  GENITOURINARY: No dysuria, hematuria.  ENDOCRINE: No polyuria, nocturia. HEMATOLOGY: No bleeding. SKIN: No rash or  lesion. MUSCULOSKELETAL: No joint pain at this time.   NEUROLOGIC: No focal weakness.  PSYCHIATRY: No anxiety or depression.   MEDICATIONS AT HOME:  Prior to Admission medications   Medication Sig Start Date End Date Taking? Authorizing Provider  aspirin 81 MG chewable tablet Chew 1 tablet (81 mg total) by mouth daily. 03/19/17  Yes [name], MD  atorvastatin (LIPITOR) 10 MG tablet Take 1 tablet (10 mg total) by mouth daily at 6 PM. 07/31/17  Yes Wellington Hampshire, MD  clopidogrel (PLAVIX) 75 MG tablet Take 1 tablet (75 mg total) by mouth daily with breakfast. 03/24/17  Yes Wellington Hampshire, MD  gabapentin (NEURONTIN) 800 MG tablet Take 800 mg by mouth 4 (four) times daily.   Yes [provider]  lisinopril (PRINIVIL,ZESTRIL) 2.5 MG tablet Take 1 tablet (2.5 mg total) by mouth daily. 03/24/17  Yes Wellington Hampshire, MD  metoprolol succinate (TOPROL-XL) 25 MG 24 hr  tablet Take 0.5 tablets (12.5 mg total) by mouth daily. 04/20/17  Yes Strader, [place] M, PA-C  pantoprazole (PROTONIX) 40 MG tablet Take 1 tablet (40 mg total) by mouth daily. 03/24/17  Yes Wellington Hampshire, MD  nitroGLYCERIN (NITROSTAT) 0.4 MG SL tablet Place 0.4 mg under the tongue every 5 (five) minutes as needed for chest pain.  05/10/13   [provider]      PHYSICAL EXAMINATION:   VITAL SIGNS: Blood pressure 123/79, pulse 70, temperature 97.8 F (36.6 C), temperature source Oral, resp. rate 18, height 6' (1.829 m), weight 68 kg, SpO2 98 %.  GENERAL:  62 y.o.-year-old patient lying in the bed with moderate distress, secondary to abdominal pain.  EYES: Pupils equal, round, reactive to light and accommodation. Scleral icterus noted. Extraocular muscles intact.  HEENT: Head atraumatic, normocephalic. Oropharynx and nasopharynx clear.  NECK:  Supple, no jugular venous distention. No thyroid enlargement, no tenderness.  LUNGS: Reduced breath sounds bilaterally, no wheezing, rales,rhonchi or crepitation. No use  of accessory muscles of respiration.  CARDIOVASCULAR: S1, S2 normal. No S3/S4.  ABDOMEN: Soft, tender in the right upper quadrant and diffusely distended.  No rebound/guarding.  Bowel sounds present.  EXTREMITIES: No pedal edema, cyanosis, or clubbing.  NEUROLOGIC: No focal weakness. PSYCHIATRIC: The patient is alert and oriented x 3.  SKIN: No obvious rash, lesion, or ulcer.   LABORATORY PANEL:   CBC Recent Labs  Lab 10/22/17 1131 10/22/17 2053  WBC 4.0  --   HGB 12.2*  --   HCT 36.2*  --   PLT 167  --   MCV 85.4  --   MCH 28.7  --   MCHC 33.6  --   RDW 19.7*  --   LYMPHSABS  --  1.1  MONOABS  --  0.4  EOSABS  --  0.4  BASOSABS  --  0.0   ------------------------------------------------------------------------------------------------------------------  Chemistries  Recent Labs  Lab 10/22/17 1131  NA 132*  K 4.3  CL 101  CO2 23  GLUCOSE 125*  BUN 13  CREATININE 1.04  CALCIUM 8.8*  AST 186*  ALT 59*  ALKPHOS 209*  BILITOT 1.3*   ------------------------------------------------------------------------------------------------------------------ estimated creatinine clearance is 73.6 mL/min (by C-G formula based on SCr of 1.04 mg/dL). ------------------------------------------------------------------------------------------------------------------ No results for input(s): TSH, T4TOTAL, T3FREE, THYROIDAB in the last 72 hours.  Invalid input(s): FREET3   Coagulation profile Recent Labs  Lab 10/22/17 2053  INR 1.25   ------------------------------------------------------------------------------------------------------------------- No results for input(s): DDIMER in the last 72 hours. -------------------------------------------------------------------------------------------------------------------  Cardiac Enzymes No results for input(s): CKMB, TROPONINI, MYOGLOBIN in the last 168 hours.  Invalid input(s):  CK ------------------------------------------------------------------------------------------------------------------ Invalid input(s): POCBNP  ---------------------------------------------------------------------------------------------------------------  Urinalysis    Component Value Date/Time   COLORURINE Yellow 05/14/2014 1606   APPEARANCEUR Clear 08/19/2016 0949   LABSPEC 1.019 05/14/2014 1606   PHURINE 6.0 05/14/2014 1606   GLUCOSEU Negative 08/19/2016 0949   GLUCOSEU Negative 05/14/2014 1606   HGBUR Negative 05/14/2014 1606   BILIRUBINUR Negative 08/19/2016 0949   BILIRUBINUR Negative 05/14/2014 1606   KETONESUR Negative 05/14/2014 1606   PROTEINUR Negative 08/19/2016 0949   PROTEINUR Negative 05/14/2014 1606   NITRITE Negative 08/19/2016 0949   NITRITE Negative 05/14/2014 1606   LEUKOCYTESUR Negative 08/19/2016 0949   LEUKOCYTESUR Negative 05/14/2014 1606     RADIOLOGY: Dg Chest 2 View  Result Date: 10/22/2017 CLINICAL DATA:  Abdominal pain and swelling 2 weeks with 20 pound weight gain. Pleuritic chest pain. EXAM: CHEST - 2 VIEW COMPARISON:  03/16/2017 and 01/02/2017 FINDINGS: Lungs are adequately inflated without consolidation or effusion. Cardiomediastinal silhouette and remainder of the exam is unchanged. IMPRESSION: No active cardiopulmonary disease. Electronically Signed   By: Marin Olp M.D.   On: 10/22/2017 17:20   US Abdomen Limited Ruq  Result Date: 10/22/2017 CLINICAL DATA:  Elevated LFTs, right upper quadrant pain EXAM: ULTRASOUND ABDOMEN LIMITED RIGHT UPPER QUADRANT COMPARISON:  CT 11/11/2014 FINDINGS: Gallbladder: Multiple stones within the gallbladder, the largest 7 mm. Sludge fills the gallbladder. Wall is thickened at 9 mm. No tenderness reported over the gallbladder during the study. Common bile duct: Diameter: Normal caliber, 4 mm Liver: The main portal vein is distended and appears thrombosed. Focal hepatic lesion posteriorly within the right hepatic  lobe measures 5.2 x 3.3 x 5.0 cm. Heterogeneous echotexture throughout the liver. Small amount of adjacent ascites. IMPRESSION: Cholelithiasis and sludge within the gallbladder. Gallbladder wall is markedly thickened measuring 9 mm. Cannot exclude cholecystitis. Portal vein thrombosis. Heterogeneous echotexture throughout the liver with focal 5.2 cm hypoechoic solid lesion in the posterior right hepatic lobe. Recommend further evaluation with liver protocol MRI. Small amount of perihepatic ascites. Electronically Signed   By: Rolm Baptise M.D.   On: 10/22/2017 18:31    EKG: Orders placed or performed during the hospital encounter of 10/22/17  . ED EKG  . ED EKG    IMPRESSION AND PLAN:  1.  Acute right upper quadrant pain, could be related to acute cholecystitis, or secondary to liver cirrhosis and malignancy.  We will start Zosyn IV for possible acute cholecystitis and check MRCP for further evaluation.  Gastroenterology is consulted.  2.  Portal vein thrombosis.  We will start heparin IV. 3.  Liver cirrhosis, with splenomegaly and moderate volume ascites.  Continue management per gastroenterology 4.  [place], s/p percutaneous thermal ablation in 10/2014, likely recurrent per abdominal ultrasound.  Will further evaluate with MRCP.  5.  Hypertension, stable, continue home medications. 6.  Alcohol abuse and tobacco abuse.  Patient was again advised to quit smoking and stop drinking alcohol.   All the records are reviewed and case discussed with ED provider. Management plans discussed with the patient, who is in agreement.  CODE STATUS: Full Code Status History    Date Active Date Inactive Code Status Order ID Comments User Context   03/16/2017 2024 03/18/2017 1420 Full Code [id number]  Gorden Harms, MD Inpatient   03/16/2017 2024 03/16/2017 2024 Full Code [id number]  Wellington Hampshire, MD Inpatient   03/09/2017 2103 03/12/2017 1725 Full Code [id number]  Wellington Hampshire, MD Inpatient   01/02/2017  1410 01/03/2017 1330 Full Code [id number]  Vaughan Basta, MD Inpatient       TOTAL TIME TAKING CARE OF THIS PATIENT: 50 minutes.    Amelia Jo M.D on 10/22/2017 at 9:54 PM  Between 7am to 6pm - Pager - [phone]  After 6pm go to [email or web address] - password [email or web address] [facility] [place] at [facility]  [phone]  CC: Primary care physician; Glendon Axe, MD

## 2017-10-22 NOTE — Consult Note (Addendum)
Patient ID: Phillip Warren, male   DOB: 14-May-1955, 62 y.o.   MRN: [id number]  HPI Phillip Warren is a 62 y.o. male seen in consultation at the request of Dr. Rip Harbour, case discussed with him in detail.  Been via the emergency room with shortness of breath on abdominal pain.  He reports that the pain is been going for several weeks but over the last few days is been getting worse.  He drinks chronically.  He said he had last time for beers.  He has a history of cirrhosis and hepatitis C.  Apparently had also hepatocellular carcinoma and receive microwave ablation in [place]. Also had a recent MI requiring stent and is on dual antiplatelet therapy. Denies any fevers or chills he denies any biliary obstruction or cholangitis Ultrasound personal review showing evidence of cholelithiasis.  There is also some thickening of the gallbladder wall.  Normal common bile duct.  He does have thrombosis of his portal vein and he has started on anticoagulation.  Did have elevation of the AST ALT alkaline phosphatase and his total bilirubin was mildly elevated to 1.3. Hemoglobin and his platelet count are reasonable.  Platelet count is 167.  Lipase is mildly elevated     HPI  Past Medical History:  Diagnosis Date  . Alcohol abuse   . Anorexia    has trouble eating due to poor appetite  . Anxiety    trouble sleeping; lost two sisters and his mother close together  . Arthritis    hands, feet (burning, stinging)  . Cancer [facility])    liver lesion initial staging.  . CHF (congestive heart failure) ([place])   . Cirrhosis of liver ([place])   . Cocaine abuse ([place])   . COPD (chronic obstructive pulmonary disease) ([place])   . Coronary artery disease    Inferior ST elevation myocardial infarction in February 2019.  Cardiac catheterization showed subtotal occlusion of mid right coronary artery with severe disease affecting the LAD and left circumflex.  Successful PCI and 2 drug-eluting stent placement to the RCA.  He  returned a few days after hospital discharge with stent thrombosis due to not taking any of his cardiac medications.   Marland Kitchen GERD (gastroesophageal reflux disease)   . H/O dizziness   . Headache   . Hepatitis    B and C (chronic)  . Marijuana use   . Myocardial infarction ([place])    massive MI at age 6  . Neuropathy   . Numbness and tingling    legs and feet bilat   . Pneumonia   . Repeated falls   . Shortness of breath dyspnea   . Tinnitus   . Tobacco abuse     Past Surgical History:  Procedure Laterality Date  . CARDIAC SURGERY    . COLONOSCOPY WITH PROPOFOL N/A 08/31/2014   Procedure: COLONOSCOPY WITH PROPOFOL;  Surgeon: Josefine Class, MD;  Location: [facility] ENDOSCOPY;  Service: Endoscopy;  Laterality: N/A;  . CORONARY ANGIOPLASTY     at age 75 secondary to massive MI  . CORONARY/GRAFT ACUTE MI REVASCULARIZATION N/A 03/09/2017   Procedure: Coronary/Graft Acute MI Revascularization;  Surgeon: Wellington Hampshire, MD;  Location: [place] CV LAB;  Service: Cardiovascular;  Laterality: N/A;  . CORONARY/GRAFT ACUTE MI REVASCULARIZATION N/A 03/16/2017   Procedure: Coronary/Graft Acute MI Revascularization;  Surgeon: Wellington Hampshire, MD;  Location: [place] CV LAB;  Service: Cardiovascular;  Laterality: N/A;  balloon only RCA  . ESOPHAGOGASTRODUODENOSCOPY (EGD) WITH PROPOFOL N/A 08/31/2014  Procedure: ESOPHAGOGASTRODUODENOSCOPY (EGD) WITH PROPOFOL;  Surgeon: Josefine Class, MD;  Location: [facility] ENDOSCOPY;  Service: Endoscopy;  Laterality: N/A;  . ESOPHAGOGASTRODUODENOSCOPY (EGD) WITH PROPOFOL N/A 01/02/2017   Procedure: ESOPHAGOGASTRODUODENOSCOPY (EGD) WITH PROPOFOL;  Surgeon: Toledo, Benay Pike, MD;  Location: [facility] ENDOSCOPY;  Service: Gastroenterology;  Laterality: N/A;  . LEFT HEART CATH AND CORONARY ANGIOGRAPHY N/A 03/09/2017   Procedure: LEFT HEART CATH AND CORONARY ANGIOGRAPHY;  Surgeon: Wellington Hampshire, MD;  Location: [place] CV LAB;  Service: Cardiovascular;   Laterality: N/A;  . LEFT HEART CATH AND CORONARY ANGIOGRAPHY N/A 03/16/2017   Procedure: LEFT HEART CATH AND CORONARY ANGIOGRAPHY;  Surgeon: Wellington Hampshire, MD;  Location: [place] CV LAB;  Service: Cardiovascular;  Laterality: N/A;    Family History  Problem Relation Age of Onset  . CAD Father   . Lung cancer Sister   . CAD Brother   . Heart disease Brother        CABG x 3  . Heart attack Brother        nine heart attacks  . Prostate cancer Neg Hx   . Bladder Cancer Neg Hx   . Kidney cancer Neg Hx     Social History Social History   Tobacco Use  . Smoking status: Current Every Day Smoker    Packs/day: 0.50    Years: 30.00    Pack years: 15.00    Types: Cigarettes  . Smokeless tobacco: Never Used  Substance Use Topics  . Alcohol use: Yes    Frequency: Never    Comment: "very little"  . Drug use: Yes    Types: Marijuana, Cocaine    Comment: pt states has not used cocaine in 20 years     Allergies  Allergen Reactions  . Multihance [Gadobenate] Hives and Shortness Of Breath  . Hydrocodone Itching and Nausea And Vomiting  . Other Itching    multihance MRI dye  . Vicodin [Hydrocodone-Acetaminophen] Nausea Only and Nausea And Vomiting    Vomiting     Current Facility-Administered Medications  Medication Dose Route Frequency Provider Last Rate Last Dose  . heparin ADULT infusion 100 units/mL (25000 units/257mL sodium chloride 0.45%)  1,150 Units/hr Intravenous Continuous Nena Polio, MD 11.5 mL/hr at 10/22/17 2125 1,150 Units/hr at 10/22/17 2125  . piperacillin-tazobactam (ZOSYN) IVPB 3.375 g  3.375 g Intravenous Q8H Amelia Jo, MD       Current Outpatient Medications  Medication Sig Dispense Refill  . aspirin 81 MG chewable tablet Chew 1 tablet (81 mg total) by mouth daily. 30 tablet 0  . atorvastatin (LIPITOR) 10 MG tablet Take 1 tablet (10 mg total) by mouth daily at 6 PM. 30 tablet 3  . clopidogrel (PLAVIX) 75 MG tablet Take 1 tablet (75 mg total)  by mouth daily with breakfast. 30 tablet 5  . gabapentin (NEURONTIN) 800 MG tablet Take 800 mg by mouth 4 (four) times daily.    Marland Kitchen lisinopril (PRINIVIL,ZESTRIL) 2.5 MG tablet Take 1 tablet (2.5 mg total) by mouth daily. 30 tablet 5  . metoprolol succinate (TOPROL-XL) 25 MG 24 hr tablet Take 0.5 tablets (12.5 mg total) by mouth daily. 30 tablet 6  . pantoprazole (PROTONIX) 40 MG tablet Take 1 tablet (40 mg total) by mouth daily. 30 tablet 11  . nitroGLYCERIN (NITROSTAT) 0.4 MG SL tablet Place 0.4 mg under the tongue every 5 (five) minutes as needed for chest pain.        Review of Systems Full ROS  was asked  and was negative except for the information on the HPI  Physical Exam Blood pressure 123/79, pulse 70, temperature 97.8 F (36.6 C), temperature source Oral, resp. rate 18, height 6' (1.829 m), weight 68 kg, SpO2 98 %. CONSTITUTIONAL: NAD, malnourihed EYES: Pupils are equal, round, and reactive to light, Sclera are non-icteric. EARS, NOSE, MOUTH AND THROAT: The oropharynx is clear. The oral mucosa is pink and moist. Hearing is intact to voice. LYMPH NODES:  Lymph nodes in the neck are normal. RESPIRATORY:  Lungs are clear. There is normal respiratory effort, with equal breath sounds bilaterally, and without pathologic use of accessory muscles. CARDIOVASCULAR: Heart is regular without murmurs, gallops, or rubs. GI: The abdomen is soft, nontender, and nondistended. There are no palpable masses. There is no hepatosplenomegaly. There are normal bowel sounds in all quadrants. GU: Rectal deferred.   MUSCULOSKELETAL: Normal muscle strength and tone. No cyanosis or edema.   SKIN: Turgor is good and there are no pathologic skin lesions or ulcers. NEUROLOGIC: Motor and sensation is grossly normal. Cranial nerves are grossly intact. PSYCH:  Oriented to person, place and time. Affect is normal.  Data Reviewed  I have personally reviewed the patient's imaging, laboratory findings and medical  records.    Assessment/ Plan [age]-year-old male with chronic abdominal pain and a history of cirrhosis and hepatocellular carcinoma as well as alcohol abuse.  He has significant comorbidities including coronary artery disease with a recent ST elevation MI and is on dual antiplatelet therapy to include Plavix.  He just started on anticoagulation for portal vein thrombosis.  Regarding his gallstones I am not clear whether or not this is his primary issue I do not suspect that he is gallbladder is causing any cholecystitis but rather all his comorbidities are having to do with his abdominal pain.  I agree with admission to the hospital and for evaluation via MRCP to rule out any evidence of choledocholithiasis.  I think at this time there is no need for any emergent surgical intervention and patient is certainly high risk given his recent MI and significant comorbidities to include cirrhosis and portal vein thrombosis as well as COPD. We will continue to follow him. I am unsure of the significance of his elevated lipase and he may be false positive.   Caroleen Hamman, MD FACS General Surgeon 10/22/2017, 10:47 PM

## 2017-10-22 NOTE — Progress Notes (Signed)
Pharmacy Antibiotic Note  Phillip Warren is a 62 y.o. male admitted on 10/22/2017 with intra-abdominal.  Pharmacy has been consulted for zosyn dosing.  Plan: Zosyn 3.375g IV q8h (4 hour infusion).  Height: 6' (182.9 cm) Weight: 150 lb (68 kg) IBW/kg (Calculated) : 77.6  Temp (24hrs), Avg:97.8 F (36.6 C), Min:97.8 F (36.6 C), Max:97.8 F (36.6 C)  Recent Labs  Lab 10/22/17 1131  WBC 4.0  CREATININE 1.04    Estimated Creatinine Clearance: 73.6 mL/min (by C-G formula based on SCr of 1.04 mg/dL).    Allergies  Allergen Reactions  . Multihance [Gadobenate] Hives and Shortness Of Breath  . Hydrocodone Itching and Nausea And Vomiting  . Other Itching    multihance MRI dye  . Vicodin [Hydrocodone-Acetaminophen] Nausea Only and Nausea And Vomiting    Vomiting     Thank you for allowing pharmacy to be a part of this patient's care.  Tobie Lords, PharmD, BCPS Clinical Pharmacist 10/22/2017

## 2017-10-23 ENCOUNTER — Other Ambulatory Visit: Payer: Self-pay

## 2017-10-23 DIAGNOSIS — I81 Portal vein thrombosis: Principal | ICD-10-CM

## 2017-10-23 DIAGNOSIS — F17210 Nicotine dependence, cigarettes, uncomplicated: Secondary | ICD-10-CM

## 2017-10-23 DIAGNOSIS — K8020 Calculus of gallbladder without cholecystitis without obstruction: Secondary | ICD-10-CM

## 2017-10-23 DIAGNOSIS — K769 Liver disease, unspecified: Secondary | ICD-10-CM

## 2017-10-23 DIAGNOSIS — I10 Essential (primary) hypertension: Secondary | ICD-10-CM

## 2017-10-23 LAB — BASIC METABOLIC PANEL
Anion gap: 7 (ref 5–15)
BUN: 12 mg/dL (ref 6–20)
CALCIUM: 8.7 mg/dL — AB (ref 8.9–10.3)
CO2: 27 mmol/L (ref 22–32)
Chloride: 101 mmol/L (ref 98–111)
Creatinine, Ser: 1.11 mg/dL (ref 0.61–1.24)
GFR calc Af Amer: >60 mL/min (ref >60)
GLUCOSE: 198 mg/dL — AB (ref 70–99)
Potassium: 3.9 mmol/L (ref 3.5–5.1)
Sodium: 135 mmol/L (ref 135–145)

## 2017-10-23 LAB — CBC
HCT: 33.9 % — ABNORMAL LOW (ref 40.0–52.0)
Hemoglobin: 11.5 g/dL — ABNORMAL LOW (ref 13.0–18.0)
MCH: 29.1 pg (ref 26.0–34.0)
MCHC: 33.9 g/dL (ref 32.0–36.0)
MCV: 85.9 fL (ref 80.0–100.0)
Platelets: 142 10*3/uL — ABNORMAL LOW (ref 150–440)
RBC: 3.94 MIL/uL — ABNORMAL LOW (ref 4.40–5.90)
RDW: 20 % — AB (ref 11.5–14.5)
WBC: 3.8 10*3/uL (ref 3.8–10.6)

## 2017-10-23 LAB — PROTIME-INR
INR: 1.29
PROTHROMBIN TIME: 16 s — AB (ref 11.4–15.2)

## 2017-10-23 LAB — HEPARIN LEVEL (UNFRACTIONATED)
Heparin Unfractionated: 0.13 IU/mL — ABNORMAL LOW (ref 0.30–0.70)
Heparin Unfractionated: 0.33 IU/mL (ref 0.30–0.70)
Heparin Unfractionated: 0.4 IU/mL (ref 0.30–0.70)

## 2017-10-23 LAB — GLUCOSE, CAPILLARY: GLUCOSE-CAPILLARY: 105 mg/dL — AB (ref 70–99)

## 2017-10-23 MED ORDER — ATORVASTATIN CALCIUM 20 MG PO TABS
10.0000 mg | ORAL_TABLET | Freq: Every day | ORAL | Status: DC
  Administered 2017-10-23 – 2017-10-24 (×2): 10 mg via ORAL
  Filled 2017-10-23 (×2): qty 1

## 2017-10-23 MED ORDER — WARFARIN SODIUM 10 MG PO TABS
10.0000 mg | ORAL_TABLET | Freq: Once | ORAL | Status: AC
  Administered 2017-10-23: 18:00:00 10 mg via ORAL
  Filled 2017-10-23: qty 1

## 2017-10-23 MED ORDER — ONDANSETRON HCL 4 MG/2ML IJ SOLN
4.0000 mg | Freq: Four times a day (QID) | INTRAMUSCULAR | Status: DC | PRN
  Administered 2017-10-23 (×2): 4 mg via INTRAVENOUS
  Filled 2017-10-23 (×2): qty 2

## 2017-10-23 MED ORDER — BISACODYL 5 MG PO TBEC: 5.0000 mg | DELAYED_RELEASE_TABLET | Freq: Every day | ORAL | Status: DC | PRN

## 2017-10-23 MED ORDER — HYDROMORPHONE HCL 1 MG/ML IJ SOLN
0.5000 mg | INTRAMUSCULAR | Status: DC | PRN
  Administered 2017-10-23 – 2017-10-25 (×9): 0.5 mg via INTRAVENOUS
  Filled 2017-10-23 (×10): qty 1

## 2017-10-23 MED ORDER — NICOTINE 21 MG/24HR TD PT24
21.0000 mg | MEDICATED_PATCH | Freq: Every day | TRANSDERMAL | Status: DC
  Administered 2017-10-23: 21 mg via TRANSDERMAL
  Filled 2017-10-23 (×3): qty 1

## 2017-10-23 MED ORDER — CLOPIDOGREL BISULFATE 75 MG PO TABS
75.0000 mg | ORAL_TABLET | Freq: Every day | ORAL | Status: DC
  Administered 2017-10-23 – 2017-10-25 (×3): 75 mg via ORAL
  Filled 2017-10-23 (×3): qty 1

## 2017-10-23 MED ORDER — GABAPENTIN 400 MG PO CAPS
800.0000 mg | ORAL_CAPSULE | Freq: Four times a day (QID) | ORAL | Status: DC
  Administered 2017-10-23 – 2017-10-25 (×9): 800 mg via ORAL
  Filled 2017-10-23: qty 2
  Filled 2017-10-23: qty 8
  Filled 2017-10-23 (×4): qty 2
  Filled 2017-10-23: qty 8
  Filled 2017-10-23 (×3): qty 2
  Filled 2017-10-23: qty 8
  Filled 2017-10-23 (×2): qty 2
  Filled 2017-10-23 (×4): qty 8
  Filled 2017-10-23 (×2): qty 2

## 2017-10-23 MED ORDER — ASPIRIN 81 MG PO CHEW
81.0000 mg | CHEWABLE_TABLET | Freq: Every day | ORAL | Status: DC
  Administered 2017-10-23 – 2017-10-24 (×2): 81 mg via ORAL
  Filled 2017-10-23 (×3): qty 1

## 2017-10-23 MED ORDER — ONDANSETRON HCL 4 MG PO TABS: 4.0000 mg | ORAL_TABLET | Freq: Four times a day (QID) | ORAL | Status: DC | PRN

## 2017-10-23 MED ORDER — HYDROCODONE-ACETAMINOPHEN 5-325 MG PO TABS: 1.0000 | ORAL_TABLET | ORAL | Status: DC | PRN

## 2017-10-23 MED ORDER — HEPARIN BOLUS VIA INFUSION
2000.0000 [IU] | Freq: Once | INTRAVENOUS | Status: AC
  Administered 2017-10-23: 09:00:00 2000 [IU] via INTRAVENOUS
  Filled 2017-10-23: qty 2000

## 2017-10-23 MED ORDER — PANTOPRAZOLE SODIUM 40 MG PO TBEC
40.0000 mg | DELAYED_RELEASE_TABLET | Freq: Every day | ORAL | Status: DC
  Administered 2017-10-23 – 2017-10-24 (×2): 40 mg via ORAL
  Filled 2017-10-23 (×2): qty 1

## 2017-10-23 MED ORDER — NITROGLYCERIN 0.4 MG SL SUBL: 0.4000 mg | SUBLINGUAL_TABLET | SUBLINGUAL | Status: DC | PRN

## 2017-10-23 MED ORDER — WARFARIN - PHARMACIST DOSING INPATIENT: Freq: Every day | Status: DC

## 2017-10-23 MED ORDER — METOPROLOL SUCCINATE ER 25 MG PO TB24
12.5000 mg | ORAL_TABLET | Freq: Every day | ORAL | Status: DC
  Administered 2017-10-25: 09:00:00 12.5 mg via ORAL
  Filled 2017-10-23 (×3): qty 1

## 2017-10-23 MED ORDER — LISINOPRIL 5 MG PO TABS
2.5000 mg | ORAL_TABLET | Freq: Every day | ORAL | Status: DC
  Administered 2017-10-25: 2.5 mg via ORAL
  Filled 2017-10-23 (×3): qty 1

## 2017-10-23 MED ORDER — DOCUSATE SODIUM 100 MG PO CAPS
100.0000 mg | ORAL_CAPSULE | Freq: Two times a day (BID) | ORAL | Status: DC
  Administered 2017-10-23 – 2017-10-25 (×5): 100 mg via ORAL
  Filled 2017-10-23 (×5): qty 1

## 2017-10-23 NOTE — Progress Notes (Signed)
Patient vomited undigested brown food. Patient said he became sick to his stomach after taking his medications. Zofran is given to patient. Will continue to monitor patient.

## 2017-10-23 NOTE — Progress Notes (Signed)
Pt seen and  D/w Mr Delena Bali. MRI pers reviewed. Recurrent HCC, portal thrombosis, GS  Likely incidental. No indication for surgical intervention. We will sign off

## 2017-10-23 NOTE — Progress Notes (Signed)
ANTICOAGULATION CONSULT NOTE - Follow up [facility] for Heparin Drip Indication: Portal Vein Thrombosis  Allergies  Allergen Reactions  . Multihance [Gadobenate] Hives and Shortness Of Breath  . Hydrocodone Itching and Nausea And Vomiting  . Other Itching    multihance MRI dye  . Vicodin [Hydrocodone-Acetaminophen] Nausea Only and Nausea And Vomiting    Vomiting     Patient Measurements: Height: 6' (182.9 cm) Weight: 152 lb 1.9 oz (69 kg) IBW/kg (Calculated) : 77.6 Heparin Dosing Weight: 68 kg  Vital Signs: Temp: 98 F (36.7 C) (09/27 1920) Temp Source: Oral (09/27 1920) BP: 133/76 (09/27 1920) Pulse Rate: 91 (09/27 1920)  Labs: Recent Labs    10/22/17 1131 10/22/17 2053 10/23/17 0405 10/23/17 1352 10/23/17 1944  HGB 12.2*  --  11.5*  --   --   HCT 36.2*  --  33.9*  --   --   PLT 167  --  142*  --   --   APTT  --  32  --   --   --   LABPROT  --  15.6*  --  16.0*  --   INR  --  1.25  --  1.29  --   HEPARINUNFRC  --   --  0.13* 0.33 0.40  CREATININE 1.04  --  1.11  --   --     Estimated Creatinine Clearance: 69.1 mL/min (by C-G formula based on SCr of 1.11 mg/dL).    Assessment: Patient is a 62 yo male admitted with possible portal vein thrombosis. Pharmacy consulted for Heparin dosing.  HEPARIN COURSE DATE TIME HL CHANGE 9/26 21:26 -- Initiate heparin - 4000 unit bolus and 1150 units/hr 9/27 04:05 0.13 RN confirms no interruption - 2000 unit bolus and increase rate to 1400 units/hr 9/27 13:52 0.33 No change 9/27  1944 0.40 No change  Goal of Therapy:  Heparin level 0.3-0.7 units/ml Monitor platelets by anticoagulation protocol: Yes   Plan:  HL therapeutic x 2. Continue current rate and recheck HL in AM. CBC in AM.   Pharmacy will continue to monitor per protocol.   Rayna Sexton, PharmD, BCPS Clinical Pharmacist 10/23/2017 8:39 PM

## 2017-10-23 NOTE — Progress Notes (Signed)
ANTICOAGULATION CONSULT NOTE - Initial Consult  Pharmacy Consult for warfarin Indication: portal vein thrombosis  Allergies  Allergen Reactions  . Multihance [Gadobenate] Hives and Shortness Of Breath  . Hydrocodone Itching and Nausea And Vomiting  . Other Itching    multihance MRI dye  . Vicodin [Hydrocodone-Acetaminophen] Nausea Only and Nausea And Vomiting    Vomiting     Patient Measurements: Height: 6' (182.9 cm) Weight: 152 lb 1.9 oz (69 kg) IBW/kg (Calculated) : 77.6 Heparin Dosing Weight:   Vital Signs: Temp: 97.7 F (36.5 C) (09/27 0830) Temp Source: Oral (09/27 0830) BP: 110/68 (09/27 0830) Pulse Rate: 79 (09/27 0830)  Labs: Recent Labs    10/22/17 1131 10/22/17 2053 10/23/17 0405  HGB 12.2*  --  11.5*  HCT 36.2*  --  33.9*  PLT 167  --  142*  APTT  --  32  --   LABPROT  --  15.6*  --   INR  --  1.25  --   HEPARINUNFRC  --   --  0.13*  CREATININE 1.04  --  1.11    Estimated Creatinine Clearance: 69.1 mL/min (by C-G formula based on SCr of 1.11 mg/dL).   Medical History: Past Medical History:  Diagnosis Date  . Alcohol abuse   . Anorexia    has trouble eating due to poor appetite  . Anxiety    trouble sleeping; lost two sisters and his mother close together  . Arthritis    hands, feet (burning, stinging)  . Cancer [facility])    liver lesion initial staging.  . CHF (congestive heart failure) ([place])   . Cirrhosis of liver ([place])   . Cocaine abuse ([place])   . COPD (chronic obstructive pulmonary disease) ([place])   . Coronary artery disease    Inferior ST elevation myocardial infarction in February 2019.  Cardiac catheterization showed subtotal occlusion of mid right coronary artery with severe disease affecting the LAD and left circumflex.  Successful PCI and 2 drug-eluting stent placement to the RCA.  He returned a few days after hospital discharge with stent thrombosis due to not taking any of his cardiac medications.   Marland Kitchen GERD (gastroesophageal reflux  disease)   . H/O dizziness   . Headache   . Hepatitis    B and C (chronic)  . Marijuana use   . Myocardial infarction ([place])    massive MI at age 71  . Neuropathy   . Numbness and tingling    legs and feet bilat   . Pneumonia   . Repeated falls   . Shortness of breath dyspnea   . Tinnitus   . Tobacco abuse     Medications:  Infusions:  . heparin 1,400 Units/hr (10/23/17 1004)  . piperacillin-tazobactam (ZOSYN)  IV 3.375 g (10/23/17 1313)    Assessment: 62 yom cc abdominal pain found to have portal vein thrombosis. Patient is currently on heparin infusion and is going to transition to VKA (since NOACs not indicated for portal vein thrombosis per vascular). Pharmacy consulted to manage VKA.  Goal of Therapy:  INR 2-3 Monitor platelets by anticoagulation protocol: Yes   Plan:  Warfarin 10 mg po x 1 this evening. Will base tomorrow's dose off INR with AM labs.   Laural Benes, Pharm.D., BCPS Clinical Pharmacist 10/23/2017,2:02 PM

## 2017-10-23 NOTE — Progress Notes (Addendum)
[facility] [facility] [place] NAME: Phillip Warren    MR#:  [id number]  DATE OF BIRTH:  18-Jun-1955  SUBJECTIVE:  CHIEF COMPLAINT:   Chief Complaint  Patient presents with  . Abdominal Pain  . Shortness of Breath  Patient seen and evaluated today Has decreased abdominal pain No vomiting Decreased shortness of breath  REVIEW OF SYSTEMS:    ROS  CONSTITUTIONAL: No documented fever. No fatigue, weakness. No weight gain, no weight loss.  EYES: No blurry or double vision.  ENT: No tinnitus. No postnasal drip. No redness of the oropharynx.  RESPIRATORY: No cough, no wheeze, no hemoptysis. No dyspnea.  CARDIOVASCULAR: No chest pain. No orthopnea. No palpitations. No syncope.  GASTROINTESTINAL: No nausea, no vomiting or diarrhea. Has abdominal pain. No melena or hematochezia.  GENITOURINARY: No dysuria or hematuria.  ENDOCRINE: No polyuria or nocturia. No heat or cold intolerance.  HEMATOLOGY: No anemia. No bruising. No bleeding.  INTEGUMENTARY: No rashes. No lesions.  MUSCULOSKELETAL: No arthritis. No swelling. No gout.  NEUROLOGIC: No numbness, tingling, or ataxia. No seizure-type activity.  PSYCHIATRIC: No anxiety. No insomnia. No ADD.   DRUG ALLERGIES:   Allergies  Allergen Reactions  . Multihance [Gadobenate] Hives and Shortness Of Breath  . Hydrocodone Itching and Nausea And Vomiting  . Other Itching    multihance MRI dye  . Vicodin [Hydrocodone-Acetaminophen] Nausea Only and Nausea And Vomiting    Vomiting     VITALS:  Blood pressure 110/68, pulse 79, temperature 97.7 F (36.5 C), temperature source Oral, resp. rate 16, height 6' (1.829 m), weight 69 kg, SpO2 97 %.  PHYSICAL EXAMINATION:   Physical Exam  GENERAL:  62 y.o.-year-old patient lying in the bed with no acute distress.  EYES: Pupils equal, round, reactive to light and accommodation. No scleral icterus. Extraocular muscles intact.  HEENT: Head atraumatic, normocephalic.  Oropharynx and nasopharynx clear.  NECK:  Supple, no jugular venous distention. No thyroid enlargement, no tenderness.  LUNGS: Normal breath sounds bilaterally, no wheezing, rales, rhonchi. No use of accessory muscles of respiration.  CARDIOVASCULAR: S1, S2 normal. No murmurs, rubs, or gallops.  ABDOMEN: Soft, mild tenderness right upper quadrant, nondistended. Bowel sounds present. No organomegaly or mass.  EXTREMITIES: No cyanosis, clubbing or edema b/l.    NEUROLOGIC: Cranial nerves II through XII are intact. No focal Motor or sensory deficits b/l.   PSYCHIATRIC: The patient is alert and oriented x 3.  SKIN: No obvious rash, lesion, or ulcer.   LABORATORY PANEL:   CBC Recent Labs  Lab 10/23/17 0405  WBC 3.8  HGB 11.5*  HCT 33.9*  PLT 142*   ------------------------------------------------------------------------------------------------------------------ Chemistries  Recent Labs  Lab 10/22/17 1131 10/23/17 0405  NA 132* 135  K 4.3 3.9  CL 101 101  CO2 23 27  GLUCOSE 125* 198*  BUN 13 12  CREATININE 1.04 1.11  CALCIUM 8.8* 8.7*  AST 186*  --   ALT 59*  --   ALKPHOS 209*  --   BILITOT 1.3*  --    ------------------------------------------------------------------------------------------------------------------  Cardiac Enzymes No results for input(s): TROPONINI in the last 168 hours. ------------------------------------------------------------------------------------------------------------------  RADIOLOGY:  Dg Chest 2 View  Result Date: 10/22/2017 CLINICAL DATA:  Abdominal pain and swelling 2 weeks with 20 pound weight gain. Pleuritic chest pain. EXAM: CHEST - 2 VIEW COMPARISON:  03/16/2017 and 01/02/2017 FINDINGS: Lungs are adequately inflated without consolidation or effusion. Cardiomediastinal silhouette and remainder of the exam is unchanged. IMPRESSION: No active cardiopulmonary  disease. Electronically Signed   By: Marin Olp M.D.   On: 10/22/2017 17:20    Mr Abdomen Mrcp Wo Contrast  Result Date: 10/23/2017 CLINICAL DATA:  Right upper quadrant abdominal pain. Elevated liver function tests. History of cirrhosis and hepatocellular carcinoma status post percutaneous thermal ablation 11/10/2014. Cholelithiasis and gallbladder wall thickening and portal vein thrombosis and posterior right liver mass on sonography performed earlier today. EXAM: MRI ABDOMEN WITHOUT CONTRAST  (INCLUDING MRCP) TECHNIQUE: Multiplanar multisequence MR imaging of the abdomen was performed. Heavily T2-weighted images of the biliary and pancreatic ducts were obtained, and three-dimensional MRCP images were rendered by post processing. COMPARISON:  10/22/2017 abdominal sonogram.  08/11/2014 MRI abdomen. FINDINGS: Lower chest: No acute abnormality at the lung bases. Hepatobiliary: Liver surface is diffusely irregular, compatible with hepatic cirrhosis. Mild diffuse hepatic steatosis. There is a large poorly marginated mass essentially replacing the right liver lobe, measuring up to 15.9 x 11.3 cm (series 3/image 13). There is expansile occlusive thrombosis of the main, right and left portal veins contiguous with this mass, most compatible with tumor thrombus. Gallbladder is filled with stones and sludge with marked diffuse gallbladder wall thickening. No biliary ductal dilatation. Common bile duct diameter 5 mm. No choledocholithiasis. Pancreas: No pancreatic mass or duct dilation.  No pancreas divisum. Spleen: Moderate splenomegaly (craniocaudal splenic length 18.1 cm). T2 hyperintense 1.5 cm superior splenic mass is not significantly changed since 2016 MRI, most compatible with a benign mass. No new splenic mass. Adrenals/Urinary Tract: Normal adrenals. No hydronephrosis. Normal kidneys with no renal mass. Stomach/Bowel: Normal non-distended stomach. Normal caliber visualized small and large bowel. Suggestion of mild wall thickening throughout the small bowel. Vascular/Lymphatic: Infrarenal  3.2 cm abdominal aortic aneurysm, increased from 2.6 cm on 08/11/2014 MRI. Mildly enlarged 1.7 cm portacaval node (series 16/image 55). Mildly enlarged right pericaval, aortocaval and left para-aortic nodes up to 1.1 cm in the left periaortic chain (series 16/image 70). Other: Moderate volume ascites.  No focal fluid collection. Musculoskeletal: No aggressive appearing focal osseous lesions. IMPRESSION: 1. Hepatic cirrhosis. Large poorly marginated liver mass essentially replacing the right liver lobe, contiguous with expansile occlusive thrombus within the main, right and left portal veins. Although this study was performed without IV contrast, the findings are compatible with advanced recurrent hepatocellular carcinoma. Repeat MRI abdomen without and with IV contrast is recommended for improved characterization of this process. 2. Moderate splenomegaly.  Moderate volume ascites. 3. Cholelithiasis. Marked diffuse gallbladder wall thickening, nonspecific but more likely noninflammatory. 4. Mild diffuse small bowel wall thickening, nonspecific probably due to noninflammatory edema. 5. Nonspecific portacaval and retroperitoneal adenopathy, cannot exclude metastatic disease. 6. Infrarenal 3.2 cm Abdominal Aortic Aneurysm (ICD10-I71.9). Recommend follow-up aortic ultrasound in 3 years. This recommendation follows ACR consensus guidelines: White Paper of the ACR Incidental Findings Committee II on Vascular Findings. J Am Coll Radiol 2013; 10:789-794. Electronically Signed   By: Ilona Sorrel M.D.   On: 10/23/2017 00:00   Mr 3d Recon At Scanner  Result Date: 10/23/2017 CLINICAL DATA:  Right upper quadrant abdominal pain. Elevated liver function tests. History of cirrhosis and hepatocellular carcinoma status post percutaneous thermal ablation 11/10/2014. Cholelithiasis and gallbladder wall thickening and portal vein thrombosis and posterior right liver mass on sonography performed earlier today. EXAM: MRI ABDOMEN  WITHOUT CONTRAST  (INCLUDING MRCP) TECHNIQUE: Multiplanar multisequence MR imaging of the abdomen was performed. Heavily T2-weighted images of the biliary and pancreatic ducts were obtained, and three-dimensional MRCP images were rendered by post processing. COMPARISON:  10/22/2017 abdominal  sonogram.  08/11/2014 MRI abdomen. FINDINGS: Lower chest: No acute abnormality at the lung bases. Hepatobiliary: Liver surface is diffusely irregular, compatible with hepatic cirrhosis. Mild diffuse hepatic steatosis. There is a large poorly marginated mass essentially replacing the right liver lobe, measuring up to 15.9 x 11.3 cm (series 3/image 13). There is expansile occlusive thrombosis of the main, right and left portal veins contiguous with this mass, most compatible with tumor thrombus. Gallbladder is filled with stones and sludge with marked diffuse gallbladder wall thickening. No biliary ductal dilatation. Common bile duct diameter 5 mm. No choledocholithiasis. Pancreas: No pancreatic mass or duct dilation.  No pancreas divisum. Spleen: Moderate splenomegaly (craniocaudal splenic length 18.1 cm). T2 hyperintense 1.5 cm superior splenic mass is not significantly changed since 2016 MRI, most compatible with a benign mass. No new splenic mass. Adrenals/Urinary Tract: Normal adrenals. No hydronephrosis. Normal kidneys with no renal mass. Stomach/Bowel: Normal non-distended stomach. Normal caliber visualized small and large bowel. Suggestion of mild wall thickening throughout the small bowel. Vascular/Lymphatic: Infrarenal 3.2 cm abdominal aortic aneurysm, increased from 2.6 cm on 08/11/2014 MRI. Mildly enlarged 1.7 cm portacaval node (series 16/image 55). Mildly enlarged right pericaval, aortocaval and left para-aortic nodes up to 1.1 cm in the left periaortic chain (series 16/image 70). Other: Moderate volume ascites.  No focal fluid collection. Musculoskeletal: No aggressive appearing focal osseous lesions. IMPRESSION: 1.  Hepatic cirrhosis. Large poorly marginated liver mass essentially replacing the right liver lobe, contiguous with expansile occlusive thrombus within the main, right and left portal veins. Although this study was performed without IV contrast, the findings are compatible with advanced recurrent hepatocellular carcinoma. Repeat MRI abdomen without and with IV contrast is recommended for improved characterization of this process. 2. Moderate splenomegaly.  Moderate volume ascites. 3. Cholelithiasis. Marked diffuse gallbladder wall thickening, nonspecific but more likely noninflammatory. 4. Mild diffuse small bowel wall thickening, nonspecific probably due to noninflammatory edema. 5. Nonspecific portacaval and retroperitoneal adenopathy, cannot exclude metastatic disease. 6. Infrarenal 3.2 cm Abdominal Aortic Aneurysm (ICD10-I71.9). Recommend follow-up aortic ultrasound in 3 years. This recommendation follows ACR consensus guidelines: White Paper of the ACR Incidental Findings Committee II on Vascular Findings. J Am Coll Radiol 2013; 10:789-794. Electronically Signed   By: Ilona Sorrel M.D.   On: 10/23/2017 00:00   US Abdomen Limited Ruq  Result Date: 10/22/2017 CLINICAL DATA:  Elevated LFTs, right upper quadrant pain EXAM: ULTRASOUND ABDOMEN LIMITED RIGHT UPPER QUADRANT COMPARISON:  CT 11/11/2014 FINDINGS: Gallbladder: Multiple stones within the gallbladder, the largest 7 mm. Sludge fills the gallbladder. Wall is thickened at 9 mm. No tenderness reported over the gallbladder during the study. Common bile duct: Diameter: Normal caliber, 4 mm Liver: The main portal vein is distended and appears thrombosed. Focal hepatic lesion posteriorly within the right hepatic lobe measures 5.2 x 3.3 x 5.0 cm. Heterogeneous echotexture throughout the liver. Small amount of adjacent ascites. IMPRESSION: Cholelithiasis and sludge within the gallbladder. Gallbladder wall is markedly thickened measuring 9 mm. Cannot exclude  cholecystitis. Portal vein thrombosis. Heterogeneous echotexture throughout the liver with focal 5.2 cm hypoechoic solid lesion in the posterior right hepatic lobe. Recommend further evaluation with liver protocol MRI. Small amount of perihepatic ascites. Electronically Signed   By: Rolm Baptise M.D.   On: 10/22/2017 18:31     ASSESSMENT AND PLAN:   62 year old male patient with history of cirrhosis of liver, hepatocellular carcinoma, alcohol abuse, tobacco abuse currently under hospitalist service for abdominal pain  -Portal vein thrombosis IV heparin drip for  anticoagulation Vascular surgery consult  -Cholelithiasis with sludge in gallbladder with gallbladder wall thickening Appreciate surgery evaluation On IV Zosyn antibiotic Did not recommend acute surgical intervention for now  -Infrarenal abdominal aortic aneurysm periodic imaging studies as outpatient  -Abnormal liver function test secondary to cirrhosis of liver Follow-up LFTs  -Cirrhosis of liver, hepatocellular cancer Gastroenterology follow-up  -Abdominal pain Symptomatic pain management  -Tobacco abuse Nicotine patch offered Tobacco cessation counseled to the patient for 6 minutes  All the records are reviewed and case discussed with Care Management/Social Worker. Management plans discussed with the patient, family and they are in agreement.  CODE STATUS: Full code  DVT Prophylaxis: SCDs  TOTAL TIME TAKING CARE OF THIS PATIENT: 34 minutes.   POSSIBLE D/C IN 2 to 3 DAYS, DEPENDING ON CLINICAL CONDITION.  Saundra Shelling M.D on 10/23/2017 at 11:25 AM  Between 7am to 6pm - Pager - [phone]  After 6pm go to [email or web address] - password [email or web address] [facility]  Office  [phone]  CC: Primary care physician; Glendon Axe, MD  Note: This dictation was prepared with Dragon dictation along with smaller phrase technology. Any transcriptional errors that result from this process are  unintentional.

## 2017-10-23 NOTE — Progress Notes (Signed)
ANTICOAGULATION CONSULT NOTE - Initial Consult  Pharmacy Consult for Heparin Drip Indication: Portal Vein Thrombosis  Allergies  Allergen Reactions  . Multihance [Gadobenate] Hives and Shortness Of Breath  . Hydrocodone Itching and Nausea And Vomiting  . Other Itching    multihance MRI dye  . Vicodin [Hydrocodone-Acetaminophen] Nausea Only and Nausea And Vomiting    Vomiting     Patient Measurements: Height: 6' (182.9 cm) Weight: 152 lb 1.9 oz (69 kg) IBW/kg (Calculated) : 77.6 Heparin Dosing Weight: 68 kg  Vital Signs: Temp: 98.2 F (36.8 C) (09/27 0536) Temp Source: Oral (09/27 0536) BP: 107/69 (09/27 0536) Pulse Rate: 82 (09/27 0033)  Labs: Recent Labs    10/22/17 1131 10/22/17 2053 10/23/17 0405  HGB 12.2*  --  11.5*  HCT 36.2*  --  33.9*  PLT 167  --  142*  APTT  --  32  --   LABPROT  --  15.6*  --   INR  --  1.25  --   HEPARINUNFRC  --   --  0.13*  CREATININE 1.04  --  1.11    Estimated Creatinine Clearance: 69.1 mL/min (by C-G formula based on SCr of 1.11 mg/dL).   Medical History: Past Medical History:  Diagnosis Date  . Alcohol abuse   . Anorexia    has trouble eating due to poor appetite  . Anxiety    trouble sleeping; lost two sisters and his mother close together  . Arthritis    hands, feet (burning, stinging)  . Cancer [facility])    liver lesion initial staging.  . CHF (congestive heart failure) ([place])   . Cirrhosis of liver ([place])   . Cocaine abuse ([place])   . COPD (chronic obstructive pulmonary disease) ([place])   . Coronary artery disease    Inferior ST elevation myocardial infarction in February 2019.  Cardiac catheterization showed subtotal occlusion of mid right coronary artery with severe disease affecting the LAD and left circumflex.  Successful PCI and 2 drug-eluting stent placement to the RCA.  He returned a few days after hospital discharge with stent thrombosis due to not taking any of his cardiac medications.   Marland Kitchen GERD (gastroesophageal  reflux disease)   . H/O dizziness   . Headache   . Hepatitis    B and C (chronic)  . Marijuana use   . Myocardial infarction ([place])    massive MI at age 66  . Neuropathy   . Numbness and tingling    legs and feet bilat   . Pneumonia   . Repeated falls   . Shortness of breath dyspnea   . Tinnitus   . Tobacco abuse     Assessment: Patient is a 62yo male admitted with possible portal vein thrombosis. Pharmacy consulted for Heparin dosing.  HEPARIN COURSE DATE TIME HL CHANGE 9/26 21:26 -- Initiate heparin - 4000 unit bolus and 1150 units/hr 9/27 04:05 0.13 RN confirms no interruption - 2000 unit bolus and increase rate to 1400 units/hr  Goal of Therapy:  Heparin level 0.3-0.7 units/ml Monitor platelets by anticoagulation protocol: Yes   Plan:  HL subtherapeutic. Give 2000 unit bolus and increase rate to 1400 units/hr per protocol. Recheck HL in 6 hours.  Akeria Hedstrom A. [place], [place].D., BCPS Clinical Pharmacist 10/23/2017 7:52 AM

## 2017-10-23 NOTE — Consult Note (Signed)
Phillip Warren , MD [address] [place], [place], [place], [address] [address], [place], [place], [place], [address] Phone: [phone]  Fax: [phone]  Consultation  Referring Provider:Dr Pyreddy Primary Care Physician:  Glendon Axe, MD Primary Gastroenterologist:  Dr. Alice Reichert          Reason for Consultation:     Abnormal LFT's   Date of Admission:  10/22/2017 Date of Consultation:  10/23/2017         HPI:   Phillip Warren is a 62 y.o. male who is known to [facility] and has been seen by Dr. Alice Reichert in the past.  The diagnosis of hepatitis C and hepatitis B infections in 2011.  Treatment nave.  History of illegal drug use in the past.  History of excess alcohol consumption in the past but had quit drinking. 2.1 cm HCC in 2016 and underwent percutaneous thermal ablation at [facility] in October 2016.  Known to have grade 2 esophageal varices in the past.  Plan in October 2018 was to determine his hepatitis B and C status and talk about treatment.  Looks like back in 2018 the plan was to obtain a CT scan of the abdomen but he really did not follow-up with the plan.  He was seen in December 2018 with hematemesis.  Upper endoscopy by Dr. Alice Reichert in December 2018.  For banding of bleeding varices.  No subsequent follow-up for endoscopy or outpatient visit.  He presented to the emergency room yesterday with abdominal pain and distention, shortness of breath.  Found to have elevated AST and ALT in the emergency room and I was called,I suggested since the common bile duct was of normal diameter to obtain an MRCP.  Abdominal ultrasound showed cholelithiasis.  Portal vein thrombosis noted in the ultrasound.  Solid lesion in the posterior right hepatic lobe noted.  MRI of the abdomen obtained today shows hepatic cirrhosis and a liver mass essentially replacing the right lobe of the liver contiguous with expansile occlusive thrombus within the main right and left portal veins.  Suggestive of  advanced recurrent hepatocellular carcinoma.  Moderate splenomegaly.  Cholelithiasis.  Mild diffuse small bowel wall thickening nonspecific probably due to noninflammatory edema.  Nonspecific retroperitoneal adenopathy.  Infrarenal 3.2 cm AAA  He says he has been having gradually worsening abdominal pain over the past 2 weeks.  Denies any consumption of alcohol or illegal drug use recently he lives by himself and states that he is not a candidate for liver transplantation due to lack of social support.  He says he has not been treated for hepatitis B or C but he follows up with hepatology at [facility].  Presently he states the abdominal pain is better after coming to the hospital.  Past Medical History:  Diagnosis Date  . Alcohol abuse   . Anorexia    has trouble eating due to poor appetite  . Anxiety    trouble sleeping; lost two sisters and his mother close together  . Arthritis    hands, feet (burning, stinging)  . Cancer [facility])    liver lesion initial staging.  . CHF (congestive heart failure) ([place])   . Cirrhosis of liver ([place])   . Cocaine abuse ([place])   . COPD (chronic obstructive pulmonary disease) ([place])   . Coronary artery disease    Inferior ST elevation myocardial infarction in February 2019.  Cardiac catheterization showed subtotal occlusion of mid right coronary artery with severe disease affecting the LAD and left  circumflex.  Successful PCI and 2 drug-eluting stent placement to the RCA.  He returned a few days after hospital discharge with stent thrombosis due to not taking any of his cardiac medications.   Marland Kitchen GERD (gastroesophageal reflux disease)   . H/O dizziness   . Headache   . Hepatitis    B and C (chronic)  . Marijuana use   . Myocardial infarction ([place])    massive MI at age 27  . Neuropathy   . Numbness and tingling    legs and feet bilat   . Pneumonia   . Repeated falls   . Shortness of breath dyspnea   . Tinnitus   . Tobacco abuse     Past Surgical History:    Procedure Laterality Date  . CARDIAC SURGERY    . COLONOSCOPY WITH PROPOFOL N/A 08/31/2014   Procedure: COLONOSCOPY WITH PROPOFOL;  Surgeon: Josefine Class, MD;  Location: [facility] ENDOSCOPY;  Service: Endoscopy;  Laterality: N/A;  . CORONARY ANGIOPLASTY     at age 10 secondary to massive MI  . CORONARY/GRAFT ACUTE MI REVASCULARIZATION N/A 03/09/2017   Procedure: Coronary/Graft Acute MI Revascularization;  Surgeon: Wellington Hampshire, MD;  Location: [place] CV LAB;  Service: Cardiovascular;  Laterality: N/A;  . CORONARY/GRAFT ACUTE MI REVASCULARIZATION N/A 03/16/2017   Procedure: Coronary/Graft Acute MI Revascularization;  Surgeon: Wellington Hampshire, MD;  Location: [place] CV LAB;  Service: Cardiovascular;  Laterality: N/A;  balloon only RCA  . ESOPHAGOGASTRODUODENOSCOPY (EGD) WITH PROPOFOL N/A 08/31/2014   Procedure: ESOPHAGOGASTRODUODENOSCOPY (EGD) WITH PROPOFOL;  Surgeon: Josefine Class, MD;  Location: [facility] ENDOSCOPY;  Service: Endoscopy;  Laterality: N/A;  . ESOPHAGOGASTRODUODENOSCOPY (EGD) WITH PROPOFOL N/A 01/02/2017   Procedure: ESOPHAGOGASTRODUODENOSCOPY (EGD) WITH PROPOFOL;  Surgeon: Toledo, Benay Pike, MD;  Location: [facility] ENDOSCOPY;  Service: Gastroenterology;  Laterality: N/A;  . LEFT HEART CATH AND CORONARY ANGIOGRAPHY N/A 03/09/2017   Procedure: LEFT HEART CATH AND CORONARY ANGIOGRAPHY;  Surgeon: Wellington Hampshire, MD;  Location: [place] CV LAB;  Service: Cardiovascular;  Laterality: N/A;  . LEFT HEART CATH AND CORONARY ANGIOGRAPHY N/A 03/16/2017   Procedure: LEFT HEART CATH AND CORONARY ANGIOGRAPHY;  Surgeon: Wellington Hampshire, MD;  Location: [place] CV LAB;  Service: Cardiovascular;  Laterality: N/A;    Prior to Admission medications   Medication Sig Start Date End Date Taking? Authorizing Provider  aspirin 81 MG chewable tablet Chew 1 tablet (81 mg total) by mouth daily. 03/19/17  Yes [name], MD  atorvastatin (LIPITOR) 10 MG tablet Take 1 tablet (10 mg  total) by mouth daily at 6 PM. 07/31/17  Yes Wellington Hampshire, MD  clopidogrel (PLAVIX) 75 MG tablet Take 1 tablet (75 mg total) by mouth daily with breakfast. 03/24/17  Yes Wellington Hampshire, MD  gabapentin (NEURONTIN) 800 MG tablet Take 800 mg by mouth 4 (four) times daily.   Yes [provider]  lisinopril (PRINIVIL,ZESTRIL) 2.5 MG tablet Take 1 tablet (2.5 mg total) by mouth daily. 03/24/17  Yes Wellington Hampshire, MD  metoprolol succinate (TOPROL-XL) 25 MG 24 hr tablet Take 0.5 tablets (12.5 mg total) by mouth daily. 04/20/17  Yes Strader, [place] M, PA-C  pantoprazole (PROTONIX) 40 MG tablet Take 1 tablet (40 mg total) by mouth daily. 03/24/17  Yes Wellington Hampshire, MD  nitroGLYCERIN (NITROSTAT) 0.4 MG SL tablet Place 0.4 mg under the tongue every 5 (five) minutes as needed for chest pain.  05/10/13   [provider]    Family  History  Problem Relation Age of Onset  . CAD Father   . Lung cancer Sister   . CAD Brother   . Heart disease Brother        CABG x 3  . Heart attack Brother        nine heart attacks  . Prostate cancer Neg Hx   . Bladder Cancer Neg Hx   . Kidney cancer Neg Hx      Social History   Tobacco Use  . Smoking status: Current Every Day Smoker    Packs/day: 0.50    Years: 30.00    Pack years: 15.00    Types: Cigarettes  . Smokeless tobacco: Never Used  Substance Use Topics  . Alcohol use: Yes    Frequency: Never    Comment: "very little"  . Drug use: Yes    Types: Marijuana, Cocaine    Comment: pt states has not used cocaine in 20 years     Allergies as of 10/22/2017 - Review Complete 10/22/2017  Allergen Reaction Noted  . Multihance [gadobenate] Hives and Shortness Of Breath 08/11/2014  . Hydrocodone Itching and Nausea And Vomiting 08/11/2014  . Other Itching 08/14/2014  . Vicodin [hydrocodone-acetaminophen] Nausea Only and Nausea And Vomiting 08/08/2014    Review of Systems:    All systems reviewed and negative except where noted  in HPI.   Physical Exam:  Vital signs in last 24 hours: Temp:  [97.7 F (36.5 C)-98.2 F (36.8 C)] 97.7 F (36.5 C) (09/27 0830) Pulse Rate:  [70-97] 79 (09/27 0830) Resp:  [16-18] 16 (09/27 0830) BP: (107-131)/(68-87) 110/68 (09/27 0830) SpO2:  [97 %-99 %] 97 % (09/27 0830) Weight:  [68 kg-69 kg] 69 kg (09/27 0536) Last BM Date: 10/21/17 General:   thin and cachectic. Head:  Normocephalic and atraumatic. Eyes:   No icterus.   Conjunctiva pink. PERRLA. Ears:  Normal auditory acuity. Neck:  Supple; no masses or thyroidomegaly Lungs: Respirations even and unlabored. Lungs clear to auscultation bilaterally.   No wheezes, crackles, or rhonchi.  Heart:  Regular rate and rhythm;  Without murmur, clicks, rubs or gallops Abdomen:  Soft, mild distention nontender particularly in the periumbilical area.. Normal bowel sounds. No appreciable masses or hepatomegaly.  No rebound or guarding.  Neurologic:  Alert and oriented x3;  grossly normal neurologically. Skin:  Intact without significant lesions or rashes. Cervical Nodes:  No significant cervical adenopathy. Psych:  Alert and cooperative. Normal affect.  LAB RESULTS: Recent Labs    10/22/17 1131 10/23/17 0405  WBC 4.0 3.8  HGB 12.2* 11.5*  HCT 36.2* 33.9*  PLT 167 142*   BMET Recent Labs    10/22/17 1131 10/23/17 0405  NA 132* 135  K 4.3 3.9  CL 101 101  CO2 23 27  GLUCOSE 125* 198*  BUN 13 12  CREATININE 1.04 1.11  CALCIUM 8.8* 8.7*   LFT Recent Labs    10/22/17 1131  PROT 7.9  ALBUMIN 3.2*  AST 186*  ALT 59*  ALKPHOS 209*  BILITOT 1.3*   PT/INR Recent Labs    10/22/17 2053  LABPROT 15.6*  INR 1.25    STUDIES: Dg Chest 2 View  Result Date: 10/22/2017 CLINICAL DATA:  Abdominal pain and swelling 2 weeks with 20 pound weight gain. Pleuritic chest pain. EXAM: CHEST - 2 VIEW COMPARISON:  03/16/2017 and 01/02/2017 FINDINGS: Lungs are adequately inflated without consolidation or effusion. Cardiomediastinal  silhouette and remainder of the exam is unchanged. IMPRESSION: No active cardiopulmonary disease.  Electronically Signed   By: Marin Olp M.D.   On: 10/22/2017 17:20   Mr Abdomen Mrcp Wo Contrast  Result Date: 10/23/2017 CLINICAL DATA:  Right upper quadrant abdominal pain. Elevated liver function tests. History of cirrhosis and hepatocellular carcinoma status post percutaneous thermal ablation 11/10/2014. Cholelithiasis and gallbladder wall thickening and portal vein thrombosis and posterior right liver mass on sonography performed earlier today. EXAM: MRI ABDOMEN WITHOUT CONTRAST  (INCLUDING MRCP) TECHNIQUE: Multiplanar multisequence MR imaging of the abdomen was performed. Heavily T2-weighted images of the biliary and pancreatic ducts were obtained, and three-dimensional MRCP images were rendered by post processing. COMPARISON:  10/22/2017 abdominal sonogram.  08/11/2014 MRI abdomen. FINDINGS: Lower chest: No acute abnormality at the lung bases. Hepatobiliary: Liver surface is diffusely irregular, compatible with hepatic cirrhosis. Mild diffuse hepatic steatosis. There is a large poorly marginated mass essentially replacing the right liver lobe, measuring up to 15.9 x 11.3 cm (series 3/image 13). There is expansile occlusive thrombosis of the main, right and left portal veins contiguous with this mass, most compatible with tumor thrombus. Gallbladder is filled with stones and sludge with marked diffuse gallbladder wall thickening. No biliary ductal dilatation. Common bile duct diameter 5 mm. No choledocholithiasis. Pancreas: No pancreatic mass or duct dilation.  No pancreas divisum. Spleen: Moderate splenomegaly (craniocaudal splenic length 18.1 cm). T2 hyperintense 1.5 cm superior splenic mass is not significantly changed since 2016 MRI, most compatible with a benign mass. No new splenic mass. Adrenals/Urinary Tract: Normal adrenals. No hydronephrosis. Normal kidneys with no renal mass. Stomach/Bowel:  Normal non-distended stomach. Normal caliber visualized small and large bowel. Suggestion of mild wall thickening throughout the small bowel. Vascular/Lymphatic: Infrarenal 3.2 cm abdominal aortic aneurysm, increased from 2.6 cm on 08/11/2014 MRI. Mildly enlarged 1.7 cm portacaval node (series 16/image 55). Mildly enlarged right pericaval, aortocaval and left para-aortic nodes up to 1.1 cm in the left periaortic chain (series 16/image 70). Other: Moderate volume ascites.  No focal fluid collection. Musculoskeletal: No aggressive appearing focal osseous lesions. IMPRESSION: 1. Hepatic cirrhosis. Large poorly marginated liver mass essentially replacing the right liver lobe, contiguous with expansile occlusive thrombus within the main, right and left portal veins. Although this study was performed without IV contrast, the findings are compatible with advanced recurrent hepatocellular carcinoma. Repeat MRI abdomen without and with IV contrast is recommended for improved characterization of this process. 2. Moderate splenomegaly.  Moderate volume ascites. 3. Cholelithiasis. Marked diffuse gallbladder wall thickening, nonspecific but more likely noninflammatory. 4. Mild diffuse small bowel wall thickening, nonspecific probably due to noninflammatory edema. 5. Nonspecific portacaval and retroperitoneal adenopathy, cannot exclude metastatic disease. 6. Infrarenal 3.2 cm Abdominal Aortic Aneurysm (ICD10-I71.9). Recommend follow-up aortic ultrasound in 3 years. This recommendation follows ACR consensus guidelines: White Paper of the ACR Incidental Findings Committee II on Vascular Findings. J Am Coll Radiol 2013; 10:789-794. Electronically Signed   By: Ilona Sorrel M.D.   On: 10/23/2017 00:00   Mr 3d Recon At Scanner  Result Date: 10/23/2017 CLINICAL DATA:  Right upper quadrant abdominal pain. Elevated liver function tests. History of cirrhosis and hepatocellular carcinoma status post percutaneous thermal ablation  11/10/2014. Cholelithiasis and gallbladder wall thickening and portal vein thrombosis and posterior right liver mass on sonography performed earlier today. EXAM: MRI ABDOMEN WITHOUT CONTRAST  (INCLUDING MRCP) TECHNIQUE: Multiplanar multisequence MR imaging of the abdomen was performed. Heavily T2-weighted images of the biliary and pancreatic ducts were obtained, and three-dimensional MRCP images were rendered by post processing. COMPARISON:  10/22/2017 abdominal sonogram.  08/11/2014 MRI abdomen. FINDINGS: Lower chest: No acute abnormality at the lung bases. Hepatobiliary: Liver surface is diffusely irregular, compatible with hepatic cirrhosis. Mild diffuse hepatic steatosis. There is a large poorly marginated mass essentially replacing the right liver lobe, measuring up to 15.9 x 11.3 cm (series 3/image 13). There is expansile occlusive thrombosis of the main, right and left portal veins contiguous with this mass, most compatible with tumor thrombus. Gallbladder is filled with stones and sludge with marked diffuse gallbladder wall thickening. No biliary ductal dilatation. Common bile duct diameter 5 mm. No choledocholithiasis. Pancreas: No pancreatic mass or duct dilation.  No pancreas divisum. Spleen: Moderate splenomegaly (craniocaudal splenic length 18.1 cm). T2 hyperintense 1.5 cm superior splenic mass is not significantly changed since 2016 MRI, most compatible with a benign mass. No new splenic mass. Adrenals/Urinary Tract: Normal adrenals. No hydronephrosis. Normal kidneys with no renal mass. Stomach/Bowel: Normal non-distended stomach. Normal caliber visualized small and large bowel. Suggestion of mild wall thickening throughout the small bowel. Vascular/Lymphatic: Infrarenal 3.2 cm abdominal aortic aneurysm, increased from 2.6 cm on 08/11/2014 MRI. Mildly enlarged 1.7 cm portacaval node (series 16/image 55). Mildly enlarged right pericaval, aortocaval and left para-aortic nodes up to 1.1 cm in the left  periaortic chain (series 16/image 70). Other: Moderate volume ascites.  No focal fluid collection. Musculoskeletal: No aggressive appearing focal osseous lesions. IMPRESSION: 1. Hepatic cirrhosis. Large poorly marginated liver mass essentially replacing the right liver lobe, contiguous with expansile occlusive thrombus within the main, right and left portal veins. Although this study was performed without IV contrast, the findings are compatible with advanced recurrent hepatocellular carcinoma. Repeat MRI abdomen without and with IV contrast is recommended for improved characterization of this process. 2. Moderate splenomegaly.  Moderate volume ascites. 3. Cholelithiasis. Marked diffuse gallbladder wall thickening, nonspecific but more likely noninflammatory. 4. Mild diffuse small bowel wall thickening, nonspecific probably due to noninflammatory edema. 5. Nonspecific portacaval and retroperitoneal adenopathy, cannot exclude metastatic disease. 6. Infrarenal 3.2 cm Abdominal Aortic Aneurysm (ICD10-I71.9). Recommend follow-up aortic ultrasound in 3 years. This recommendation follows ACR consensus guidelines: White Paper of the ACR Incidental Findings Committee II on Vascular Findings. J Am Coll Radiol 2013; 10:789-794. Electronically Signed   By: Ilona Sorrel M.D.   On: 10/23/2017 00:00   US Abdomen Limited Ruq  Result Date: 10/22/2017 CLINICAL DATA:  Elevated LFTs, right upper quadrant pain EXAM: ULTRASOUND ABDOMEN LIMITED RIGHT UPPER QUADRANT COMPARISON:  CT 11/11/2014 FINDINGS: Gallbladder: Multiple stones within the gallbladder, the largest 7 mm. Sludge fills the gallbladder. Wall is thickened at 9 mm. No tenderness reported over the gallbladder during the study. Common bile duct: Diameter: Normal caliber, 4 mm Liver: The main portal vein is distended and appears thrombosed. Focal hepatic lesion posteriorly within the right hepatic lobe measures 5.2 x 3.3 x 5.0 cm. Heterogeneous echotexture throughout the  liver. Small amount of adjacent ascites. IMPRESSION: Cholelithiasis and sludge within the gallbladder. Gallbladder wall is markedly thickened measuring 9 mm. Cannot exclude cholecystitis. Portal vein thrombosis. Heterogeneous echotexture throughout the liver with focal 5.2 cm hypoechoic solid lesion in the posterior right hepatic lobe. Recommend further evaluation with liver protocol MRI. Small amount of perihepatic ascites. Electronically Signed   By: Rolm Baptise M.D.   On: 10/22/2017 18:31      Impression / Plan:   JAESON MOLSTAD is a 62 y.o. y/o male with tree of hepatitis B/C cirrhosis of the liver history of alcohol abuse in the past that he has quit.  History  of hepatocellular carcinoma in the past.  Presented to the ER with abdominal pain.  Found of on USG abdomen  a hepatic lesion and subsequently MRI demonstrated portal vein thrombosis as well as a recurrence of his hepatocellular carcinoma in the right lobe of the liver.  It is likely that the malignancy contributed to the hypercoagulable state and led to portal vein thrombosis.  Cirrhosis by itself leads to a hypercoagulable state and can lead  to portal vein thrombosis often .  Does have a history of esophageal varices that have bled in the past . Anticoagulation in this setting is not truly a contraindication ,in fact it has been found that the bleeding rates in the  individuals with esophageal varices who are  Treated for  portal vein thrombosis with anticoagulation decreases risk  of bleeding due to lowering portal pressures.  His abdominal pain may be due to the portal vein thrombosis and there is a significant risk of the thrombosis extending into the SMA which could lead to a life-threatening condition like mesenteric ischemia.  I would think that he would need to be anticoagulated.  But I would also like the opinion of vascular surgery and probably hematology as well at the same time.  Could also consider changing his metoprolol to  nadolol or propranolol which would help decrease the pressures in his esophageal varices.  He would need work-up for the liver mass.  Goals of care would need to be discussed.   Thank you for involving me in the care of this patient.      LOS: 1 day   Phillip Bellows, MD  10/23/2017, 10:44 AM

## 2017-10-23 NOTE — Progress Notes (Signed)
ANTICOAGULATION CONSULT NOTE - Initial Consult  Pharmacy Consult for Heparin Drip Indication: Portal Vein Thrombosis  Allergies  Allergen Reactions  . Multihance [Gadobenate] Hives and Shortness Of Breath  . Hydrocodone Itching and Nausea And Vomiting  . Other Itching    multihance MRI dye  . Vicodin [Hydrocodone-Acetaminophen] Nausea Only and Nausea And Vomiting    Vomiting     Patient Measurements: Height: 6' (182.9 cm) Weight: 152 lb 1.9 oz (69 kg) IBW/kg (Calculated) : 77.6 Heparin Dosing Weight: 68 kg  Vital Signs: Temp: 97.7 F (36.5 C) (09/27 1410) Temp Source: Oral (09/27 1410) BP: 110/71 (09/27 1410) Pulse Rate: 85 (09/27 1410)  Labs: Recent Labs    10/22/17 1131 10/22/17 2053 10/23/17 0405 10/23/17 1352  HGB 12.2*  --  11.5*  --   HCT 36.2*  --  33.9*  --   PLT 167  --  142*  --   APTT  --  32  --   --   LABPROT  --  15.6*  --  16.0*  INR  --  1.25  --  1.29  HEPARINUNFRC  --   --  0.13* 0.33  CREATININE 1.04  --  1.11  --     Estimated Creatinine Clearance: 69.1 mL/min (by C-G formula based on SCr of 1.11 mg/dL).   Medical History: Past Medical History:  Diagnosis Date  . Alcohol abuse   . Anorexia    has trouble eating due to poor appetite  . Anxiety    trouble sleeping; lost two sisters and his mother close together  . Arthritis    hands, feet (burning, stinging)  . Cancer [facility])    liver lesion initial staging.  . CHF (congestive heart failure) ([place])   . Cirrhosis of liver ([place])   . Cocaine abuse ([place])   . COPD (chronic obstructive pulmonary disease) ([place])   . Coronary artery disease    Inferior ST elevation myocardial infarction in February 2019.  Cardiac catheterization showed subtotal occlusion of mid right coronary artery with severe disease affecting the LAD and left circumflex.  Successful PCI and 2 drug-eluting stent placement to the RCA.  He returned a few days after hospital discharge with stent thrombosis due to not taking any  of his cardiac medications.   Marland Kitchen GERD (gastroesophageal reflux disease)   . H/O dizziness   . Headache   . Hepatitis    B and C (chronic)  . Marijuana use   . Myocardial infarction ([place])    massive MI at age 37  . Neuropathy   . Numbness and tingling    legs and feet bilat   . Pneumonia   . Repeated falls   . Shortness of breath dyspnea   . Tinnitus   . Tobacco abuse     Assessment: Patient is a 62yo male admitted with possible portal vein thrombosis. Pharmacy consulted for Heparin dosing.  HEPARIN COURSE DATE TIME HL CHANGE 9/26 21:26 -- Initiate heparin - 4000 unit bolus and 1150 units/hr 9/27 04:05 0.13 RN confirms no interruption - 2000 unit bolus and increase rate to 1400 units/hr 9/27 13:52 0.33 No change  Goal of Therapy:  Heparin level 0.3-0.7 units/ml Monitor platelets by anticoagulation protocol: Yes   Plan:  HL therapeutic x 1. Continue current rate and recheck HL in 6 hours. Patient will also start on VKA this evening. Pharmacy will continue to monitor per protocol.   Cayle Cordoba A. Gage, [place].D., BCPS Clinical Pharmacist 10/23/2017 2:48 PM

## 2017-10-23 NOTE — Plan of Care (Signed)
  Problem: Education: Goal: Knowledge of General Education information will improve Description Including pain rating scale, medication(s)/side effects and non-pharmacologic comfort measures Outcome: Progressing   Problem: Health Behavior/Discharge Planning: Goal: Ability to manage health-related needs will improve Outcome: Progressing   Problem: Clinical Measurements: Goal: Ability to maintain clinical measurements within normal limits will improve Outcome: Progressing Goal: Cardiovascular complication will be avoided Outcome: Progressing   Problem: Nutrition: Goal: Adequate nutrition will be maintained Outcome: Progressing   Problem: Elimination: Goal: Will not experience complications related to urinary retention Outcome: Progressing   Problem: Pain Managment: Goal: General experience of comfort will improve Outcome: Progressing   Problem: Safety: Goal: Ability to remain free from injury will improve Outcome: Progressing   Problem: Skin Integrity: Goal: Risk for impaired skin integrity will decrease Outcome: Progressing

## 2017-10-23 NOTE — Progress Notes (Signed)
Advanced care plan.  Purpose of the Encounter: CODE STATUS  Parties in Attendance: Patient   Patient's Decision Capacity: Good  Subjective/Patient's story: Presented to emergency room for abdominal pain   Objective/Medical story Patient has portal vein thrombosis Cirrhosis of liver Needs IV heparin drip for anticoagulation, surgery and vascular surgery evaluation   Goals of care determination:  Advance care directives and goals of care and treatment plan discussed Patient wants everything done which includes CPR, intubation and ventilator if the need arises   CODE STATUS: Full code   Time spent discussing advanced care planning: 16 minutes

## 2017-10-23 NOTE — Consult Note (Signed)
[facility] Vascular Consult Note  MRN : [id number]  Phillip Warren is a 62 y.o. (12/20/1955) male who presents with chief complaint of  Chief Complaint  Patient presents with  . Abdominal Pain  . Shortness of Breath   History of Present Illness:  The patient is a 63 year old male with a past medical history of EtOH abuse, anxiety, arthritis congestive heart failure, cirrhosis of the liver, cocaine abuse, COPD, coronary artery disease, GERD, hepatitis B and C, liver cancer, marijuana use, MI, neuropathy, tobacco abuse who presented to the [facility] emergency department complaining of right upper quadrant pain.  The patient endorses a history of progressively worsening right upper quadrant pain which is what prompted him to seek medical attention.  The patient was also experiencing nausea but no vomiting.  The patient notes that his pain has progressively worsened over the last 2 weeks.  He states that his abdomen has become aggressively more distended as well.  The patient denies any changes in his urinary or bowel status.  The patient denies any blood or dark stools.  Patient denies any fever, nausea vomiting.  During his emergency room work-up the patient was found to have portal vein thrombosis.  The patient was admitted and IV anticoagulation has been started.  The vascular surgery service was consulted for further recommendations in regard to the patient's portal vein thrombosis.  Current Facility-Administered Medications  Medication Dose Route Frequency Provider Last Rate Last Dose  . aspirin chewable tablet 81 mg  81 mg Oral Daily Amelia Jo, MD   81 mg at 10/23/17 0839  . atorvastatin (LIPITOR) tablet 10 mg  10 mg Oral q1800 Amelia Jo, MD      . bisacodyl (DULCOLAX) EC tablet 5 mg  5 mg Oral Daily PRN Amelia Jo, MD      . clopidogrel (PLAVIX) tablet 75 mg  75 mg Oral Q breakfast Amelia Jo, MD   75 mg at 10/23/17 0839   . docusate sodium (COLACE) capsule 100 mg  100 mg Oral BID Amelia Jo, MD   100 mg at 10/23/17 0839  . gabapentin (NEURONTIN) capsule 800 mg  800 mg Oral QID Amelia Jo, MD   800 mg at 10/23/17 0840  . heparin ADULT infusion 100 units/mL (25000 units/245mL sodium chloride 0.45%)  1,400 Units/hr Intravenous Continuous Saundra Shelling, MD 14 mL/hr at 10/23/17 1004 1,400 Units/hr at 10/23/17 1004  . HYDROmorphone (DILAUDID) injection 0.5 mg  0.5 mg Intravenous Q4H PRN Amelia Jo, MD   0.5 mg at 10/23/17 0617  . lisinopril (PRINIVIL,ZESTRIL) tablet 2.5 mg  2.5 mg Oral Daily Amelia Jo, MD      . metoprolol succinate (TOPROL-XL) 24 hr tablet 12.5 mg  12.5 mg Oral Daily Amelia Jo, MD      . nicotine (NICODERM CQ - dosed in mg/24 hours) patch 21 mg  21 mg Transdermal Daily Pyreddy, Reatha Harps, MD   21 mg at 10/23/17 1008  . nitroGLYCERIN (NITROSTAT) SL tablet 0.4 mg  0.4 mg Sublingual Q5 min PRN Amelia Jo, MD      . ondansetron [facility]) tablet 4 mg  4 mg Oral Q6H PRN Amelia Jo, MD       Or  . ondansetron [facility]) injection 4 mg  4 mg Intravenous Q6H PRN Amelia Jo, MD      . pantoprazole (PROTONIX) EC tablet 40 mg  40 mg Oral Daily Amelia Jo, MD   40 mg at 10/23/17 2831  . piperacillin-tazobactam (ZOSYN)  IVPB 3.375 g  3.375 g Intravenous Tera Partridge, MD 12.5 mL/hr at 10/23/17 0606 3.375 g at 10/23/17 0606   Past Medical History:  Diagnosis Date  . Alcohol abuse   . Anorexia    has trouble eating due to poor appetite  . Anxiety    trouble sleeping; lost two sisters and his mother close together  . Arthritis    hands, feet (burning, stinging)  . Cancer [facility])    liver lesion initial staging.  . CHF (congestive heart failure) ([place])   . Cirrhosis of liver ([place])   . Cocaine abuse ([place])   . COPD (chronic obstructive pulmonary disease) ([place])   . Coronary artery disease    Inferior ST elevation myocardial infarction in February 2019.  Cardiac catheterization showed  subtotal occlusion of mid right coronary artery with severe disease affecting the LAD and left circumflex.  Successful PCI and 2 drug-eluting stent placement to the RCA.  He returned a few days after hospital discharge with stent thrombosis due to not taking any of his cardiac medications.   Marland Kitchen GERD (gastroesophageal reflux disease)   . H/O dizziness   . Headache   . Hepatitis    B and C (chronic)  . Marijuana use   . Myocardial infarction ([place])    massive MI at age 78  . Neuropathy   . Numbness and tingling    legs and feet bilat   . Pneumonia   . Repeated falls   . Shortness of breath dyspnea   . Tinnitus   . Tobacco abuse    Past Surgical History:  Procedure Laterality Date  . CARDIAC SURGERY    . COLONOSCOPY WITH PROPOFOL N/A 08/31/2014   Procedure: COLONOSCOPY WITH PROPOFOL;  Surgeon: Josefine Class, MD;  Location: [facility] ENDOSCOPY;  Service: Endoscopy;  Laterality: N/A;  . CORONARY ANGIOPLASTY     at age 6 secondary to massive MI  . CORONARY/GRAFT ACUTE MI REVASCULARIZATION N/A 03/09/2017   Procedure: Coronary/Graft Acute MI Revascularization;  Surgeon: Wellington Hampshire, MD;  Location: [place] CV LAB;  Service: Cardiovascular;  Laterality: N/A;  . CORONARY/GRAFT ACUTE MI REVASCULARIZATION N/A 03/16/2017   Procedure: Coronary/Graft Acute MI Revascularization;  Surgeon: Wellington Hampshire, MD;  Location: [place] [facility];  Service: Cardiovascular;  Laterality: N/A;  balloon only RCA  . ESOPHAGOGASTRODUODENOSCOPY (EGD) WITH PROPOFOL N/A 08/31/2014   Procedure: ESOPHAGOGASTRODUODENOSCOPY (EGD) WITH PROPOFOL;  Surgeon: Josefine Class, MD;  Location: [facility] ENDOSCOPY;  Service: Endoscopy;  Laterality: N/A;  . ESOPHAGOGASTRODUODENOSCOPY (EGD) WITH PROPOFOL N/A 01/02/2017   Procedure: ESOPHAGOGASTRODUODENOSCOPY (EGD) WITH PROPOFOL;  Surgeon: [place], Benay Pike, MD;  Location: [facility] ENDOSCOPY;  Service: Gastroenterology;  Laterality: N/A;  . LEFT HEART CATH AND CORONARY  ANGIOGRAPHY N/A 03/09/2017   Procedure: LEFT HEART CATH AND CORONARY ANGIOGRAPHY;  Surgeon: Wellington Hampshire, MD;  Location: [place] [facility];  Service: Cardiovascular;  Laterality: N/A;  . LEFT HEART CATH AND CORONARY ANGIOGRAPHY N/A 03/16/2017   Procedure: LEFT HEART CATH AND CORONARY ANGIOGRAPHY;  Surgeon: Wellington Hampshire, MD;  Location: [place] [facility];  Service: Cardiovascular;  Laterality: N/A;   Social History Social History   Tobacco Use  . Smoking status: Current Every Day Smoker    Packs/day: 0.50    Years: 30.00    Pack years: 15.00    Types: Cigarettes  . Smokeless tobacco: Never Used  Substance Use Topics  . Alcohol use: Yes    Frequency: Never    Comment: "very  little"  . Drug use: Yes    Types: Marijuana, Cocaine    Comment: pt states has not used cocaine in 20 years    Family History Family History  Problem Relation Age of Onset  . CAD Father   . Lung cancer Sister   . CAD Brother   . Heart disease Brother        CABG x 3  . Heart attack Brother        nine heart attacks  . Prostate cancer Neg Hx   . Bladder Cancer Neg Hx   . Kidney cancer Neg Hx   The patient denies any family history of peripheral artery disease, venous disease or bleeding / clotting disorders.  Allergies  Allergen Reactions  . Multihance [Gadobenate] Hives and Shortness Of Breath  . Hydrocodone Itching and Nausea And Vomiting  . Other Itching    multihance MRI dye  . Vicodin [Hydrocodone-Acetaminophen] Nausea Only and Nausea And Vomiting    Vomiting    REVIEW OF SYSTEMS (Negative unless checked)  Constitutional: [] Weight loss  [] Fever  [] Chills Cardiac: [] Chest pain   [] Chest pressure   [] Palpitations   [] Shortness of breath when laying flat   [] Shortness of breath at rest   [] Shortness of breath with exertion. Vascular:  [] Pain in legs with walking   [] Pain in legs at rest   [] Pain in legs when laying flat   [] Claudication   [] Pain in feet when walking  [] Pain in feet  at rest  [] Pain in feet when laying flat   [] History of DVT   [] Phlebitis   [] Swelling in legs   [] Varicose veins   [] Non-healing ulcers Pulmonary:   [] Uses home oxygen   [] Productive cough   [] Hemoptysis   [] Wheeze  [] COPD   [] Asthma Neurologic:  [] Dizziness  [] Blackouts   [] Seizures   [] History of stroke   [] History of TIA  [] Aphasia   [] Temporary blindness   [] Dysphagia   [] Weakness or numbness in arms   [] Weakness or numbness in legs Musculoskeletal:  [] Arthritis   [] Joint swelling   [] Joint pain   [] Low back pain Hematologic:  [] Easy bruising  [] Easy bleeding   [] Hypercoagulable state   [] Anemic  [] Hepatitis Gastrointestinal:  [] Blood in stool   [] Vomiting blood  [] Gastroesophageal reflux/heartburn   [] Difficulty swallowing. Genitourinary:  [] Chronic kidney disease   [] Difficult urination  [] Frequent urination  [] Burning with urination   [] Blood in urine Skin:  [] Rashes   [] Ulcers   [] Wounds Psychological:  [] History of anxiety   []  History of major depression.  Review of system is positive for abdominal pain, abdominal distention and nausea  Physical Examination  Vitals:   10/23/17 0000 10/23/17 0033 10/23/17 0536 10/23/17 0830  BP: 128/70 122/87 107/69 110/68  Pulse: 79 82  79  Resp: 17 18 18 16   Temp:  97.7 F (36.5 C) 98.2 F (36.8 C) 97.7 F (36.5 C)  TempSrc:  Oral Oral Oral  SpO2: 98% 97%  97%  Weight:   69 kg   Height:       Body mass index is 20.63 kg/m. Gen:  WD/WN, NAD Head: [place]/AT, No temporalis wasting. Prominent temp pulse not noted. Ear/Nose/Throat: Hearing grossly intact, nares w/o erythema or drainage, oropharynx w/o Erythema/Exudate Eyes: Sclera non-icteric, conjunctiva clear Neck: Trachea midline.  No JVD.  Pulmonary:  Good air movement, respirations not labored, equal bilaterally.  Cardiac: RRR, normal S1, S2. Vascular:  Vessel Right Left  Radial Palpable Palpable  Ulnar Palpable Palpable  Brachial  Palpable Palpable  Carotid Palpable, without bruit  Palpable, without bruit  Aorta Not palpable N/A  Femoral Palpable Palpable  Popliteal Palpable Palpable  PT Palpable Palpable  DP Palpable Palpable   Gastrointestinal: Distended, Soft, Mildly tender to palpation, (+) bowel sounds Musculoskeletal: M/S 5/5 throughout.  Extremities without ischemic changes.  No deformity or atrophy. No edema. Neurologic: Sensation grossly intact in extremities.  Symmetrical.  Speech is fluent. Motor exam as listed above. Psychiatric: Judgment intact, Mood & affect appropriate for pt's clinical situation. Dermatologic: No rashes or ulcers noted.  No cellulitis or open wounds. Lymph : No Cervical, Axillary, or Inguinal lymphadenopathy.  CBC Lab Results  Component Value Date   WBC 3.8 10/23/2017   HGB 11.5 (L) 10/23/2017   HCT 33.9 (L) 10/23/2017   MCV 85.9 10/23/2017   PLT 142 (L) 10/23/2017   BMET    Component Value Date/Time   NA 135 10/23/2017 0405   NA 135 05/14/2014 1606   K 3.9 10/23/2017 0405   K 4.2 05/14/2014 1606   CL 101 10/23/2017 0405   CL 99 (L) 05/14/2014 1606   CO2 27 10/23/2017 0405   CO2 26 05/14/2014 1606   GLUCOSE 198 (H) 10/23/2017 0405   GLUCOSE 109 (H) 05/14/2014 1606   BUN 12 10/23/2017 0405   BUN 17 05/14/2014 1606   CREATININE 1.11 10/23/2017 0405   CREATININE 1.14 05/14/2014 1606   CALCIUM 8.7 (L) 10/23/2017 0405   CALCIUM 9.0 05/14/2014 1606   GFRNONAA >60 10/23/2017 0405   GFRNONAA >60 05/14/2014 1606   GFRAA >60 10/23/2017 0405   GFRAA >60 05/14/2014 1606   Estimated Creatinine Clearance: 69.1 mL/min (by C-G formula based on SCr of 1.11 mg/dL).  COAG Lab Results  Component Value Date   INR 1.25 10/22/2017   INR 1.16 03/09/2017   INR 1.20 01/02/2017   Radiology Dg Chest 2 View  Result Date: 10/22/2017 CLINICAL DATA:  Abdominal pain and swelling 2 weeks with 20 pound weight gain. Pleuritic chest pain. EXAM: CHEST - 2 VIEW COMPARISON:  03/16/2017 and 01/02/2017 FINDINGS: Lungs are adequately inflated  without consolidation or effusion. Cardiomediastinal silhouette and remainder of the exam is unchanged. IMPRESSION: No active cardiopulmonary disease. Electronically Signed   By: Marin Olp M.D.   On: 10/22/2017 17:20   Mr Abdomen Mrcp Wo Contrast  Result Date: 10/23/2017 CLINICAL DATA:  Right upper quadrant abdominal pain. Elevated liver function tests. History of cirrhosis and hepatocellular carcinoma status post percutaneous thermal ablation 11/10/2014. Cholelithiasis and gallbladder wall thickening and portal vein thrombosis and posterior right liver mass on sonography performed earlier today. EXAM: MRI ABDOMEN WITHOUT CONTRAST  (INCLUDING MRCP) TECHNIQUE: Multiplanar multisequence MR imaging of the abdomen was performed. Heavily T2-weighted images of the biliary and pancreatic ducts were obtained, and three-dimensional MRCP images were rendered by post processing. COMPARISON:  10/22/2017 abdominal sonogram.  08/11/2014 MRI abdomen. FINDINGS: Lower chest: No acute abnormality at the lung bases. Hepatobiliary: Liver surface is diffusely irregular, compatible with hepatic cirrhosis. Mild diffuse hepatic steatosis. There is a large poorly marginated mass essentially replacing the right liver lobe, measuring up to 15.9 x 11.3 cm (series 3/image 13). There is expansile occlusive thrombosis of the main, right and left portal veins contiguous with this mass, most compatible with tumor thrombus. Gallbladder is filled with stones and sludge with marked diffuse gallbladder wall thickening. No biliary ductal dilatation. Common bile duct diameter 5 mm. No choledocholithiasis. Pancreas: No pancreatic mass or duct dilation.  No pancreas divisum. Spleen: Moderate  splenomegaly (craniocaudal splenic length 18.1 cm). T2 hyperintense 1.5 cm superior splenic mass is not significantly changed since 2016 MRI, most compatible with a benign mass. No new splenic mass. Adrenals/Urinary Tract: Normal adrenals. No hydronephrosis.  Normal kidneys with no renal mass. Stomach/Bowel: Normal non-distended stomach. Normal caliber visualized small and large bowel. Suggestion of mild wall thickening throughout the small bowel. Vascular/Lymphatic: Infrarenal 3.2 cm abdominal aortic aneurysm, increased from 2.6 cm on 08/11/2014 MRI. Mildly enlarged 1.7 cm portacaval node (series 16/image 55). Mildly enlarged right pericaval, aortocaval and left para-aortic nodes up to 1.1 cm in the left periaortic chain (series 16/image 70). Other: Moderate volume ascites.  No focal fluid collection. Musculoskeletal: No aggressive appearing focal osseous lesions. IMPRESSION: 1. Hepatic cirrhosis. Large poorly marginated liver mass essentially replacing the right liver lobe, contiguous with expansile occlusive thrombus within the main, right and left portal veins. Although this study was performed without IV contrast, the findings are compatible with advanced recurrent hepatocellular carcinoma. Repeat MRI abdomen without and with IV contrast is recommended for improved characterization of this process. 2. Moderate splenomegaly.  Moderate volume ascites. 3. Cholelithiasis. Marked diffuse gallbladder wall thickening, nonspecific but more likely noninflammatory. 4. Mild diffuse small bowel wall thickening, nonspecific probably due to noninflammatory edema. 5. Nonspecific portacaval and retroperitoneal adenopathy, cannot exclude metastatic disease. 6. Infrarenal 3.2 cm Abdominal Aortic Aneurysm (ICD10-I71.9). Recommend follow-up aortic ultrasound in 3 years. This recommendation follows ACR consensus guidelines: White Paper of the ACR Incidental Findings Committee II on Vascular Findings. J Am Coll Radiol 2013; 10:789-794. Electronically Signed   By: Ilona Sorrel M.D.   On: 10/23/2017 00:00   Mr 3d Recon At Scanner  Result Date: 10/23/2017 CLINICAL DATA:  Right upper quadrant abdominal pain. Elevated liver function tests. History of cirrhosis and hepatocellular carcinoma  status post percutaneous thermal ablation 11/10/2014. Cholelithiasis and gallbladder wall thickening and portal vein thrombosis and posterior right liver mass on sonography performed earlier today. EXAM: MRI ABDOMEN WITHOUT CONTRAST  (INCLUDING MRCP) TECHNIQUE: Multiplanar multisequence MR imaging of the abdomen was performed. Heavily T2-weighted images of the biliary and pancreatic ducts were obtained, and three-dimensional MRCP images were rendered by post processing. COMPARISON:  10/22/2017 abdominal sonogram.  08/11/2014 MRI abdomen. FINDINGS: Lower chest: No acute abnormality at the lung bases. Hepatobiliary: Liver surface is diffusely irregular, compatible with hepatic cirrhosis. Mild diffuse hepatic steatosis. There is a large poorly marginated mass essentially replacing the right liver lobe, measuring up to 15.9 x 11.3 cm (series 3/image 13). There is expansile occlusive thrombosis of the main, right and left portal veins contiguous with this mass, most compatible with tumor thrombus. Gallbladder is filled with stones and sludge with marked diffuse gallbladder wall thickening. No biliary ductal dilatation. Common bile duct diameter 5 mm. No choledocholithiasis. Pancreas: No pancreatic mass or duct dilation.  No pancreas divisum. Spleen: Moderate splenomegaly (craniocaudal splenic length 18.1 cm). T2 hyperintense 1.5 cm superior splenic mass is not significantly changed since 2016 MRI, most compatible with a benign mass. No new splenic mass. Adrenals/Urinary Tract: Normal adrenals. No hydronephrosis. Normal kidneys with no renal mass. Stomach/Bowel: Normal non-distended stomach. Normal caliber visualized small and large bowel. Suggestion of mild wall thickening throughout the small bowel. Vascular/Lymphatic: Infrarenal 3.2 cm abdominal aortic aneurysm, increased from 2.6 cm on 08/11/2014 MRI. Mildly enlarged 1.7 cm portacaval node (series 16/image 55). Mildly enlarged right pericaval, aortocaval and left  para-aortic nodes up to 1.1 cm in the left periaortic chain (series 16/image 70). Other: Moderate volume ascites.  No focal fluid collection. Musculoskeletal: No aggressive appearing focal osseous lesions. IMPRESSION: 1. Hepatic cirrhosis. Large poorly marginated liver mass essentially replacing the right liver lobe, contiguous with expansile occlusive thrombus within the main, right and left portal veins. Although this study was performed without IV contrast, the findings are compatible with advanced recurrent hepatocellular carcinoma. Repeat MRI abdomen without and with IV contrast is recommended for improved characterization of this process. 2. Moderate splenomegaly.  Moderate volume ascites. 3. Cholelithiasis. Marked diffuse gallbladder wall thickening, nonspecific but more likely noninflammatory. 4. Mild diffuse small bowel wall thickening, nonspecific probably due to noninflammatory edema. 5. Nonspecific portacaval and retroperitoneal adenopathy, cannot exclude metastatic disease. 6. Infrarenal 3.2 cm Abdominal Aortic Aneurysm (ICD10-I71.9). Recommend follow-up aortic ultrasound in 3 years. This recommendation follows ACR consensus guidelines: White Paper of the ACR Incidental Findings Committee II on Vascular Findings. J Am Coll Radiol 2013; 10:789-794. Electronically Signed   By: Ilona Sorrel M.D.   On: 10/23/2017 00:00   US Abdomen Limited Ruq  Result Date: 10/22/2017 CLINICAL DATA:  Elevated LFTs, right upper quadrant pain EXAM: ULTRASOUND ABDOMEN LIMITED RIGHT UPPER QUADRANT COMPARISON:  CT 11/11/2014 FINDINGS: Gallbladder: Multiple stones within the gallbladder, the largest 7 mm. Sludge fills the gallbladder. Wall is thickened at 9 mm. No tenderness reported over the gallbladder during the study. Common bile duct: Diameter: Normal caliber, 4 mm Liver: The main portal vein is distended and appears thrombosed. Focal hepatic lesion posteriorly within the right hepatic lobe measures 5.2 x 3.3 x 5.0 cm.  Heterogeneous echotexture throughout the liver. Small amount of adjacent ascites. IMPRESSION: Cholelithiasis and sludge within the gallbladder. Gallbladder wall is markedly thickened measuring 9 mm. Cannot exclude cholecystitis. Portal vein thrombosis. Heterogeneous echotexture throughout the liver with focal 5.2 cm hypoechoic solid lesion in the posterior right hepatic lobe. Recommend further evaluation with liver protocol MRI. Small amount of perihepatic ascites. Electronically Signed   By: Rolm Baptise M.D.   On: 10/22/2017 18:31   Assessment/Plan The patient is a 62 year old male with a past medical history of EtOH abuse, anxiety, arthritis congestive heart failure, cirrhosis of the liver, cocaine abuse, COPD, coronary artery disease, GERD, hepatitis B and C, liver cancer, marijuana use, MI, neuropathy, tobacco abuse who presented to the [facility] emergency department complaining of right upper quadrant pain - found to have portal vein thrombosis 1. Portal Vein Thrombosis: Patient with portal vein thrombosis.  Unfortunately, due to the anatomic location of the thrombosis endovascular intervention is not possible.  There is no access to the portal vein from the vena cava.  Agree with IV heparin at this time.  Would transition to oral Coumadin (as the newer anticoagulants are not indicated for portal vein thrombosis) and closely monitor his INR as an outpatient.  Vascular surgery will sign off at this time. 2. Tobacco Abuse: We had a discussion for approximately ten minutes regarding the absolute need for smoking cessation due to the deleterious nature of tobacco on the vascular system. We discussed the tobacco use would diminish patency of any intervention, and likely significantly worsen progressio of disease. We discussed multiple agents for quitting including replacement therapy or medications to reduce cravings such as Chantix. The patient voices their understanding of the  importance of smoking cessation. 3. Hypertension: On appropriate medications. Encouraged good control as its slows the progression of atherosclerotic disease. 4. Liver Disease / Hepatitis B&C / Liver Cancer: Possible etiology to portal vein thrombosis  Discussed with Dr. Francene Castle,  PA-C  10/23/2017 10:26 AM  This note was created with Dragon medical transcription system.  Any error is purely unintentional.

## 2017-10-23 NOTE — Progress Notes (Signed)
[facility] Progress Note     Subjective: Patient sitting up this morning eating breakfast. He notes that he continues to have diffuse abdominal pain worse in the RUQ. No nausea, emesis, fever, or chills.   Objective: Vital signs in last 24 hours: Temp:  [97.7 F (36.5 C)-98.2 F (36.8 C)] 97.7 F (36.5 C) (09/27 0830) Pulse Rate:  [70-97] 79 (09/27 0830) Resp:  [16-18] 16 (09/27 0830) BP: (107-131)/(68-87) 110/68 (09/27 0830) SpO2:  [97 %-99 %] 97 % (09/27 0830) Weight:  [68 kg-69 kg] 69 kg (09/27 0536) Last BM Date: 10/22/17  Intake/Output from previous day: 09/26 0701 - 09/27 0700 In: 120.9 [I.V.:90.2; IV Piggyback:30.7] Out: 600 [Urine:600] Intake/Output this shift: No intake/output data recorded.  PE: Gen:  Alert, NAD, pleasant Pulm:  Normal effort Abd: Soft, diffuse tenderness worse in RUQ, non-distended Skin: warm and dry, no rashes  Psych: A&Ox3   Lab Results:  Recent Labs    10/22/17 1131 10/23/17 0405  WBC 4.0 3.8  HGB 12.2* 11.5*  HCT 36.2* 33.9*  PLT 167 142*   BMET Recent Labs    10/22/17 1131 10/23/17 0405  NA 132* 135  K 4.3 3.9  CL 101 101  CO2 23 27  GLUCOSE 125* 198*  BUN 13 12  CREATININE 1.04 1.11  CALCIUM 8.8* 8.7*   PT/INR Recent Labs    10/22/17 2053  LABPROT 15.6*  INR 1.25   CMP     Component Value Date/Time   NA 135 10/23/2017 0405   NA 135 05/14/2014 1606   K 3.9 10/23/2017 0405   K 4.2 05/14/2014 1606   CL 101 10/23/2017 0405   CL 99 (L) 05/14/2014 1606   CO2 27 10/23/2017 0405   CO2 26 05/14/2014 1606   GLUCOSE 198 (H) 10/23/2017 0405   GLUCOSE 109 (H) 05/14/2014 1606   BUN 12 10/23/2017 0405   BUN 17 05/14/2014 1606   CREATININE 1.11 10/23/2017 0405   CREATININE 1.14 05/14/2014 1606   CALCIUM 8.7 (L) 10/23/2017 0405   CALCIUM 9.0 05/14/2014 1606   PROT 7.9 10/22/2017 1131   PROT 7.7 07/24/2017 1203   PROT 8.8 (H) 05/14/2014 1606   ALBUMIN 3.2 (L) 10/22/2017 1131   ALBUMIN 3.8  07/24/2017 1203   ALBUMIN 3.9 05/14/2014 1606   AST 186 (H) 10/22/2017 1131   AST 162 (H) 05/14/2014 1606   ALT 59 (H) 10/22/2017 1131   ALT 184 (H) 05/14/2014 1606   ALKPHOS 209 (H) 10/22/2017 1131   ALKPHOS 101 05/14/2014 1606   BILITOT 1.3 (H) 10/22/2017 1131   BILITOT 0.5 07/24/2017 1203   BILITOT 0.7 05/14/2014 1606   GFRNONAA >60 10/23/2017 0405   GFRNONAA >60 05/14/2014 1606   GFRAA >60 10/23/2017 0405   GFRAA >60 05/14/2014 1606   Lipase     Component Value Date/Time   LIPASE 52 (H) 10/22/2017 1131   LIPASE 36 05/14/2014 1606       Studies/Results: Dg Chest 2 View  Result Date: 10/22/2017 CLINICAL DATA:  Abdominal pain and swelling 2 weeks with 20 pound weight gain. Pleuritic chest pain. EXAM: CHEST - 2 VIEW COMPARISON:  03/16/2017 and 01/02/2017 FINDINGS: Lungs are adequately inflated without consolidation or effusion. Cardiomediastinal silhouette and remainder of the exam is unchanged. IMPRESSION: No active cardiopulmonary disease. Electronically Signed   By: Marin Olp M.D.   On: 10/22/2017 17:20   Mr Abdomen Mrcp Wo Contrast  Result Date: 10/23/2017 CLINICAL DATA:  Right upper quadrant abdominal pain. Elevated liver  function tests. History of cirrhosis and hepatocellular carcinoma status post percutaneous thermal ablation 11/10/2014. Cholelithiasis and gallbladder wall thickening and portal vein thrombosis and posterior right liver mass on sonography performed earlier today. EXAM: MRI ABDOMEN WITHOUT CONTRAST  (INCLUDING MRCP) TECHNIQUE: Multiplanar multisequence MR imaging of the abdomen was performed. Heavily T2-weighted images of the biliary and pancreatic ducts were obtained, and three-dimensional MRCP images were rendered by post processing. COMPARISON:  10/22/2017 abdominal sonogram.  08/11/2014 MRI abdomen. FINDINGS: Lower chest: No acute abnormality at the lung bases. Hepatobiliary: Liver surface is diffusely irregular, compatible with hepatic cirrhosis. Mild  diffuse hepatic steatosis. There is a large poorly marginated mass essentially replacing the right liver lobe, measuring up to 15.9 x 11.3 cm (series 3/image 13). There is expansile occlusive thrombosis of the main, right and left portal veins contiguous with this mass, most compatible with tumor thrombus. Gallbladder is filled with stones and sludge with marked diffuse gallbladder wall thickening. No biliary ductal dilatation. Common bile duct diameter 5 mm. No choledocholithiasis. Pancreas: No pancreatic mass or duct dilation.  No pancreas divisum. Spleen: Moderate splenomegaly (craniocaudal splenic length 18.1 cm). T2 hyperintense 1.5 cm superior splenic mass is not significantly changed since 2016 MRI, most compatible with a benign mass. No new splenic mass. Adrenals/Urinary Tract: Normal adrenals. No hydronephrosis. Normal kidneys with no renal mass. Stomach/Bowel: Normal non-distended stomach. Normal caliber visualized small and large bowel. Suggestion of mild wall thickening throughout the small bowel. Vascular/Lymphatic: Infrarenal 3.2 cm abdominal aortic aneurysm, increased from 2.6 cm on 08/11/2014 MRI. Mildly enlarged 1.7 cm portacaval node (series 16/image 55). Mildly enlarged right pericaval, aortocaval and left para-aortic nodes up to 1.1 cm in the left periaortic chain (series 16/image 70). Other: Moderate volume ascites.  No focal fluid collection. Musculoskeletal: No aggressive appearing focal osseous lesions. IMPRESSION: 1. Hepatic cirrhosis. Large poorly marginated liver mass essentially replacing the right liver lobe, contiguous with expansile occlusive thrombus within the main, right and left portal veins. Although this study was performed without IV contrast, the findings are compatible with advanced recurrent hepatocellular carcinoma. Repeat MRI abdomen without and with IV contrast is recommended for improved characterization of this process. 2. Moderate splenomegaly.  Moderate volume ascites.  3. Cholelithiasis. Marked diffuse gallbladder wall thickening, nonspecific but more likely noninflammatory. 4. Mild diffuse small bowel wall thickening, nonspecific probably due to noninflammatory edema. 5. Nonspecific portacaval and retroperitoneal adenopathy, cannot exclude metastatic disease. 6. Infrarenal 3.2 cm Abdominal Aortic Aneurysm (ICD10-I71.9). Recommend follow-up aortic ultrasound in 3 years. This recommendation follows ACR consensus guidelines: White Paper of the ACR Incidental Findings Committee II on Vascular Findings. J Am Coll Radiol 2013; 10:789-794. Electronically Signed   By: Ilona Sorrel M.D.   On: 10/23/2017 00:00   Mr 3d Recon At Scanner  Result Date: 10/23/2017 CLINICAL DATA:  Right upper quadrant abdominal pain. Elevated liver function tests. History of cirrhosis and hepatocellular carcinoma status post percutaneous thermal ablation 11/10/2014. Cholelithiasis and gallbladder wall thickening and portal vein thrombosis and posterior right liver mass on sonography performed earlier today. EXAM: MRI ABDOMEN WITHOUT CONTRAST  (INCLUDING MRCP) TECHNIQUE: Multiplanar multisequence MR imaging of the abdomen was performed. Heavily T2-weighted images of the biliary and pancreatic ducts were obtained, and three-dimensional MRCP images were rendered by post processing. COMPARISON:  10/22/2017 abdominal sonogram.  08/11/2014 MRI abdomen. FINDINGS: Lower chest: No acute abnormality at the lung bases. Hepatobiliary: Liver surface is diffusely irregular, compatible with hepatic cirrhosis. Mild diffuse hepatic steatosis. There is a large poorly marginated mass essentially  replacing the right liver lobe, measuring up to 15.9 x 11.3 cm (series 3/image 13). There is expansile occlusive thrombosis of the main, right and left portal veins contiguous with this mass, most compatible with tumor thrombus. Gallbladder is filled with stones and sludge with marked diffuse gallbladder wall thickening. No biliary  ductal dilatation. Common bile duct diameter 5 mm. No choledocholithiasis. Pancreas: No pancreatic mass or duct dilation.  No pancreas divisum. Spleen: Moderate splenomegaly (craniocaudal splenic length 18.1 cm). T2 hyperintense 1.5 cm superior splenic mass is not significantly changed since 2016 MRI, most compatible with a benign mass. No new splenic mass. Adrenals/Urinary Tract: Normal adrenals. No hydronephrosis. Normal kidneys with no renal mass. Stomach/Bowel: Normal non-distended stomach. Normal caliber visualized small and large bowel. Suggestion of mild wall thickening throughout the small bowel. Vascular/Lymphatic: Infrarenal 3.2 cm abdominal aortic aneurysm, increased from 2.6 cm on 08/11/2014 MRI. Mildly enlarged 1.7 cm portacaval node (series 16/image 55). Mildly enlarged right pericaval, aortocaval and left para-aortic nodes up to 1.1 cm in the left periaortic chain (series 16/image 70). Other: Moderate volume ascites.  No focal fluid collection. Musculoskeletal: No aggressive appearing focal osseous lesions. IMPRESSION: 1. Hepatic cirrhosis. Large poorly marginated liver mass essentially replacing the right liver lobe, contiguous with expansile occlusive thrombus within the main, right and left portal veins. Although this study was performed without IV contrast, the findings are compatible with advanced recurrent hepatocellular carcinoma. Repeat MRI abdomen without and with IV contrast is recommended for improved characterization of this process. 2. Moderate splenomegaly.  Moderate volume ascites. 3. Cholelithiasis. Marked diffuse gallbladder wall thickening, nonspecific but more likely noninflammatory. 4. Mild diffuse small bowel wall thickening, nonspecific probably due to noninflammatory edema. 5. Nonspecific portacaval and retroperitoneal adenopathy, cannot exclude metastatic disease. 6. Infrarenal 3.2 cm Abdominal Aortic Aneurysm (ICD10-I71.9). Recommend follow-up aortic ultrasound in 3 years.  This recommendation follows ACR consensus guidelines: White Paper of the ACR Incidental Findings Committee II on Vascular Findings. J Am Coll Radiol 2013; 10:789-794. Electronically Signed   By: Ilona Sorrel M.D.   On: 10/23/2017 00:00   US Abdomen Limited Ruq  Result Date: 10/22/2017 CLINICAL DATA:  Elevated LFTs, right upper quadrant pain EXAM: ULTRASOUND ABDOMEN LIMITED RIGHT UPPER QUADRANT COMPARISON:  CT 11/11/2014 FINDINGS: Gallbladder: Multiple stones within the gallbladder, the largest 7 mm. Sludge fills the gallbladder. Wall is thickened at 9 mm. No tenderness reported over the gallbladder during the study. Common bile duct: Diameter: Normal caliber, 4 mm Liver: The main portal vein is distended and appears thrombosed. Focal hepatic lesion posteriorly within the right hepatic lobe measures 5.2 x 3.3 x 5.0 cm. Heterogeneous echotexture throughout the liver. Small amount of adjacent ascites. IMPRESSION: Cholelithiasis and sludge within the gallbladder. Gallbladder wall is markedly thickened measuring 9 mm. Cannot exclude cholecystitis. Portal vein thrombosis. Heterogeneous echotexture throughout the liver with focal 5.2 cm hypoechoic solid lesion in the posterior right hepatic lobe. Recommend further evaluation with liver protocol MRI. Small amount of perihepatic ascites. Electronically Signed   By: Rolm Baptise M.D.   On: 10/22/2017 18:31    Anti-infectives: Anti-infectives (From admission, onward)   Start     Dose/Rate Route Frequency Ordered Stop   10/22/17 2215  piperacillin-tazobactam (ZOSYN) IVPB 3.375 g     3.375 g 12.5 mL/hr over 240 Minutes Intravenous Every 8 hours 10/22/17 2203     10/22/17 2045  cefOXitin (MEFOXIN) 1 g in sodium chloride 0.9 % 100 mL IVPB  Status:  Discontinued     1 g  200 mL/hr over 30 Minutes Intravenous  Once 10/22/17 2037 10/22/17 2204       Assessment/Plan  Chronic Abdominal Pain - Reviewed MRCP with morning with Dr. Dahlia Byes which is more concerning for  cirrhosis and recurrent [place]. This did show cholelithiasis and non-specific gall bladder wall thickening. However, the source of his pain and elevated LFTs is more likely from his cirrhosis and recurrent HCC rather than his gall bladder.  - Can consider outpatient follow up, however, he is a poor surgical candidate given chronic medical conditions and liver mass.  - General surgery will sign off, please re-consult as needed.    LOS: 1 day    Edison Simon , PA-C [facility] 10/23/2017, 8:37 AM [phone] M-F: 7am - 4pm

## 2017-10-24 LAB — CBC
HCT: 33.1 % — ABNORMAL LOW (ref 40.0–52.0)
Hemoglobin: 11.2 g/dL — ABNORMAL LOW (ref 13.0–18.0)
MCH: 28.8 pg (ref 26.0–34.0)
MCHC: 33.9 g/dL (ref 32.0–36.0)
MCV: 84.9 fL (ref 80.0–100.0)
PLATELETS: 141 10*3/uL — AB (ref 150–440)
RBC: 3.9 MIL/uL — ABNORMAL LOW (ref 4.40–5.90)
RDW: 19.7 % — ABNORMAL HIGH (ref 11.5–14.5)
WBC: 4 10*3/uL (ref 3.8–10.6)

## 2017-10-24 LAB — COMPREHENSIVE METABOLIC PANEL
ALBUMIN: 2.9 g/dL — AB (ref 3.5–5.0)
ALK PHOS: 175 U/L — AB (ref 38–126)
ALT: 52 U/L — AB (ref 0–44)
AST: 163 U/L — AB (ref 15–41)
Anion gap: 7 (ref 5–15)
BUN: 10 mg/dL (ref 6–20)
CALCIUM: 8.6 mg/dL — AB (ref 8.9–10.3)
CHLORIDE: 101 mmol/L (ref 98–111)
CO2: 26 mmol/L (ref 22–32)
Creatinine, Ser: 1.01 mg/dL (ref 0.61–1.24)
GFR calc Af Amer: >60 mL/min (ref >60)
GFR calc non Af Amer: >60 mL/min (ref >60)
GLUCOSE: 99 mg/dL (ref 70–99)
Potassium: 3.7 mmol/L (ref 3.5–5.1)
SODIUM: 134 mmol/L — AB (ref 135–145)
Total Bilirubin: 1.6 mg/dL — ABNORMAL HIGH (ref 0.3–1.2)
Total Protein: 7 g/dL (ref 6.5–8.1)

## 2017-10-24 LAB — PROTIME-INR
INR: 1.4
PROTHROMBIN TIME: 17 s — AB (ref 11.4–15.2)

## 2017-10-24 LAB — HEPARIN LEVEL (UNFRACTIONATED): Heparin Unfractionated: 0.42 IU/mL (ref 0.30–0.70)

## 2017-10-24 LAB — GLUCOSE, CAPILLARY
GLUCOSE-CAPILLARY: 184 mg/dL — AB (ref 70–99)
Glucose-Capillary: 96 mg/dL (ref 70–99)

## 2017-10-24 MED ORDER — ONDANSETRON HCL 4 MG/2ML IJ SOLN
INTRAMUSCULAR | Status: AC
  Filled 2017-10-24: qty 2

## 2017-10-24 MED ORDER — LACTULOSE 10 GM/15ML PO SOLN
30.0000 g | Freq: Two times a day (BID) | ORAL | Status: DC
  Administered 2017-10-24 (×2): 30 g via ORAL
  Filled 2017-10-24 (×3): qty 60

## 2017-10-24 MED ORDER — PANTOPRAZOLE SODIUM 40 MG PO TBEC
40.0000 mg | DELAYED_RELEASE_TABLET | Freq: Every day | ORAL | Status: DC
  Administered 2017-10-25: 09:00:00 40 mg via ORAL
  Filled 2017-10-24: qty 1

## 2017-10-24 MED ORDER — ONDANSETRON HCL 4 MG PO TABS: 4.0000 mg | ORAL_TABLET | ORAL | Status: DC | PRN

## 2017-10-24 MED ORDER — ONDANSETRON HCL 4 MG PO TABS: 4.0000 mg | ORAL_TABLET | Freq: Four times a day (QID) | ORAL | Status: DC | PRN

## 2017-10-24 MED ORDER — ONDANSETRON HCL 4 MG/2ML IJ SOLN
4.0000 mg | INTRAMUSCULAR | Status: DC | PRN
  Administered 2017-10-24 – 2017-10-25 (×5): 4 mg via INTRAVENOUS
  Filled 2017-10-24 (×5): qty 2

## 2017-10-24 MED ORDER — ONDANSETRON HCL 4 MG/2ML IJ SOLN: 4.0000 mg | INTRAMUSCULAR | Status: DC | PRN

## 2017-10-24 MED ORDER — WARFARIN SODIUM 10 MG PO TABS
10.0000 mg | ORAL_TABLET | Freq: Once | ORAL | Status: AC
  Administered 2017-10-24: 10 mg via ORAL
  Filled 2017-10-24: qty 1

## 2017-10-24 NOTE — Progress Notes (Signed)
Patient IV on the RFA was bleeding. IV removed and pressure dressing applied. Patient tolerated well. Will continue to monitor patient.

## 2017-10-24 NOTE — Progress Notes (Signed)
  GI progress note  Patient sleeping comfortably. No pain on dilaudid and zofran.  Vital signs stable  HEENT: [place]/AT Chest: CTA CV:RR Abd: distended with ascites and hepatomegaly. Ext: no edema.  Imp:  1. Hepatocellular carcinoma  2. Portal vein thrombosis - on coumadiine. 3. Cholelithiasis, no cholecystitis. 4. CAD - On plavix, coumadin, aspirin.  Rec:  1. Continue coumadin. 2. Following.

## 2017-10-24 NOTE — Progress Notes (Signed)
ANTICOAGULATION CONSULT NOTE - Follow up [place] for Heparin Drip Indication: Portal Vein Thrombosis  Allergies  Allergen Reactions  . Multihance [Gadobenate] Hives and Shortness Of Breath  . Hydrocodone Itching and Nausea And Vomiting  . Other Itching    multihance MRI dye  . Vicodin [Hydrocodone-Acetaminophen] Nausea Only and Nausea And Vomiting    Vomiting     Patient Measurements: Height: 6' (182.9 cm) Weight: (Unable to get accurate reading on PT bed) IBW/kg (Calculated) : 77.6 Heparin Dosing Weight: 68 kg  Vital Signs: Temp: 97.6 F (36.4 C) (09/28 0355) Temp Source: Oral (09/28 0355) BP: 114/79 (09/28 0355) Pulse Rate: 81 (09/28 0355)  Labs: Recent Labs    10/22/17 1131 10/22/17 2053  10/23/17 0405 10/23/17 1352 10/23/17 1944 10/24/17 0408  HGB 12.2*  --   --  11.5*  --   --  11.2*  HCT 36.2*  --   --  33.9*  --   --  33.1*  PLT 167  --   --  142*  --   --  141*  APTT  --  32  --   --   --   --   --   LABPROT  --  15.6*  --   --  16.0*  --  17.0*  INR  --  1.25  --   --  1.29  --  1.40  HEPARINUNFRC  --   --    < > 0.13* 0.33 0.40 0.42  CREATININE 1.04  --   --  1.11  --   --  1.01   < > = values in this interval not displayed.    Estimated Creatinine Clearance: 75.9 mL/min (by C-G formula based on SCr of 1.01 mg/dL).    Assessment: Patient is a 62 yo male admitted with possible portal vein thrombosis. Pharmacy consulted for Heparin dosing.  HEPARIN COURSE DATE TIME HL CHANGE 9/26 21:26 -- Initiate heparin - 4000 unit bolus and 1150 units/hr 9/27 04:05 0.13 RN confirms no interruption - 2000 unit bolus and increase rate to 1400 units/hr 9/27 13:52 0.33 No change 9/27  1944 0.40 No change  Goal of Therapy:  Heparin level 0.3-0.7 units/ml Monitor platelets by anticoagulation protocol: Yes   Plan:  09/28 @ 0500 HL 0.42 therapeutic. Will continue current regimen and will recheck HL w/ am labs. Will continue monitoring CBC/INR w/  am labs.  Tobie Lords, PharmD, BCPS Clinical Pharmacist 10/24/2017

## 2017-10-24 NOTE — Progress Notes (Signed)
[facility] NAME: Phillip Warren    MR#:  [id number]  DATE OF BIRTH:  1955/06/05  SUBJECTIVE:   patient here with portal vein thrombosis.  Denies chest pain.  Denies shortness of breath.  Abdominal pain improving.  He is constipated.  REVIEW OF SYSTEMS:    Review of Systems  Constitutional: Negative for fever, chills weight loss HENT: Negative for ear pain, nosebleeds, congestion, facial swelling, rhinorrhea, neck pain, neck stiffness and ear discharge.   Respiratory: Negative for cough, shortness of breath, wheezing  Cardiovascular: Negative for chest pain, palpitations and leg swelling.  Gastrointestinal: Negative for heartburn, abdominal pain, vomiting, diarrhea  ++consitpation Genitourinary: Negative for dysuria, urgency, frequency, hematuria Musculoskeletal: Negative for back pain or joint pain Neurological: Negative for dizziness, seizures, syncope, focal weakness,  numbness and headaches.  Hematological: Does not bruise/bleed easily.  Psychiatric/Behavioral: Negative for hallucinations, confusion, dysphoric mood    Tolerating Diet: yes      DRUG ALLERGIES:   Allergies  Allergen Reactions  . Multihance [Gadobenate] Hives and Shortness Of Breath  . Hydrocodone Itching and Nausea And Vomiting  . Other Itching    multihance MRI dye  . Vicodin [Hydrocodone-Acetaminophen] Nausea Only and Nausea And Vomiting    Vomiting     VITALS:  Blood pressure 106/73, pulse 86, temperature 98.2 F (36.8 C), temperature source Oral, resp. rate 18, height 6' (1.829 m), weight 69 kg, SpO2 91 %.  PHYSICAL EXAMINATION:  Constitutional: Appears well-developed and well-nourished. No distress. HENT: Normocephalic. Marland Kitchen Oropharynx is clear and moist.  Eyes: Conjunctivae and EOM are normal. PERRLA, no scleral icterus.  Neck: Normal ROM. Neck supple. No JVD. No tracheal deviation. CVS: RRR, S1/S2 +, no murmurs, no gallops, no carotid bruit.   Pulmonary: Effort and breath sounds normal, no stridor, rhonchi, wheezes, rales.  Abdominal: Soft. BS +,  no distension, tenderness, rebound or guarding.  Musculoskeletal: Normal range of motion. No edema and no tenderness.  Neuro: Alert. CN 2-12 grossly intact. No focal deficits. Skin: Skin is warm and dry. No rash noted. Psychiatric: Normal mood and affect.      LABORATORY PANEL:   CBC Recent Labs  Lab 10/24/17 0408  WBC 4.0  HGB 11.2*  HCT 33.1*  PLT 141*   ------------------------------------------------------------------------------------------------------------------  Chemistries  Recent Labs  Lab 10/24/17 0408  NA 134*  K 3.7  CL 101  CO2 26  GLUCOSE 99  BUN 10  CREATININE 1.01  CALCIUM 8.6*  AST 163*  ALT 52*  ALKPHOS 175*  BILITOT 1.6*   ------------------------------------------------------------------------------------------------------------------  Cardiac Enzymes No results for input(s): TROPONINI in the last 168 hours. ------------------------------------------------------------------------------------------------------------------  RADIOLOGY:  Dg Chest 2 View  Result Date: 10/22/2017 CLINICAL DATA:  Abdominal pain and swelling 2 weeks with 20 pound weight gain. Pleuritic chest pain. EXAM: CHEST - 2 VIEW COMPARISON:  03/16/2017 and 01/02/2017 FINDINGS: Lungs are adequately inflated without consolidation or effusion. Cardiomediastinal silhouette and remainder of the exam is unchanged. IMPRESSION: No active cardiopulmonary disease. Electronically Signed   By: Marin Olp M.D.   On: 10/22/2017 17:20   Mr Abdomen Mrcp Wo Contrast  Result Date: 10/23/2017 CLINICAL DATA:  Right upper quadrant abdominal pain. Elevated liver function tests. History of cirrhosis and hepatocellular carcinoma status post percutaneous thermal ablation 11/10/2014. Cholelithiasis and gallbladder wall thickening and portal vein thrombosis and posterior right liver mass on  sonography performed earlier today. EXAM: MRI ABDOMEN WITHOUT CONTRAST  (INCLUDING MRCP) TECHNIQUE: Multiplanar multisequence MR imaging  of the abdomen was performed. Heavily T2-weighted images of the biliary and pancreatic ducts were obtained, and three-dimensional MRCP images were rendered by post processing. COMPARISON:  10/22/2017 abdominal sonogram.  08/11/2014 MRI abdomen. FINDINGS: Lower chest: No acute abnormality at the lung bases. Hepatobiliary: Liver surface is diffusely irregular, compatible with hepatic cirrhosis. Mild diffuse hepatic steatosis. There is a large poorly marginated mass essentially replacing the right liver lobe, measuring up to 15.9 x 11.3 cm (series 3/image 13). There is expansile occlusive thrombosis of the main, right and left portal veins contiguous with this mass, most compatible with tumor thrombus. Gallbladder is filled with stones and sludge with marked diffuse gallbladder wall thickening. No biliary ductal dilatation. Common bile duct diameter 5 mm. No choledocholithiasis. Pancreas: No pancreatic mass or duct dilation.  No pancreas divisum. Spleen: Moderate splenomegaly (craniocaudal splenic length 18.1 cm). T2 hyperintense 1.5 cm superior splenic mass is not significantly changed since 2016 MRI, most compatible with a benign mass. No new splenic mass. Adrenals/Urinary Tract: Normal adrenals. No hydronephrosis. Normal kidneys with no renal mass. Stomach/Bowel: Normal non-distended stomach. Normal caliber visualized small and large bowel. Suggestion of mild wall thickening throughout the small bowel. Vascular/Lymphatic: Infrarenal 3.2 cm abdominal aortic aneurysm, increased from 2.6 cm on 08/11/2014 MRI. Mildly enlarged 1.7 cm portacaval node (series 16/image 55). Mildly enlarged right pericaval, aortocaval and left para-aortic nodes up to 1.1 cm in the left periaortic chain (series 16/image 70). Other: Moderate volume ascites.  No focal fluid collection. Musculoskeletal: No  aggressive appearing focal osseous lesions. IMPRESSION: 1. Hepatic cirrhosis. Large poorly marginated liver mass essentially replacing the right liver lobe, contiguous with expansile occlusive thrombus within the main, right and left portal veins. Although this study was performed without IV contrast, the findings are compatible with advanced recurrent hepatocellular carcinoma. Repeat MRI abdomen without and with IV contrast is recommended for improved characterization of this process. 2. Moderate splenomegaly.  Moderate volume ascites. 3. Cholelithiasis. Marked diffuse gallbladder wall thickening, nonspecific but more likely noninflammatory. 4. Mild diffuse small bowel wall thickening, nonspecific probably due to noninflammatory edema. 5. Nonspecific portacaval and retroperitoneal adenopathy, cannot exclude metastatic disease. 6. Infrarenal 3.2 cm Abdominal Aortic Aneurysm (ICD10-I71.9). Recommend follow-up aortic ultrasound in 3 years. This recommendation follows ACR consensus guidelines: White Paper of the ACR Incidental Findings Committee II on Vascular Findings. J Am Coll Radiol 2013; 10:789-794. Electronically Signed   By: Ilona Sorrel M.D.   On: 10/23/2017 00:00   Mr 3d Recon At Scanner  Result Date: 10/23/2017 CLINICAL DATA:  Right upper quadrant abdominal pain. Elevated liver function tests. History of cirrhosis and hepatocellular carcinoma status post percutaneous thermal ablation 11/10/2014. Cholelithiasis and gallbladder wall thickening and portal vein thrombosis and posterior right liver mass on sonography performed earlier today. EXAM: MRI ABDOMEN WITHOUT CONTRAST  (INCLUDING MRCP) TECHNIQUE: Multiplanar multisequence MR imaging of the abdomen was performed. Heavily T2-weighted images of the biliary and pancreatic ducts were obtained, and three-dimensional MRCP images were rendered by post processing. COMPARISON:  10/22/2017 abdominal sonogram.  08/11/2014 MRI abdomen. FINDINGS: Lower chest: No  acute abnormality at the lung bases. Hepatobiliary: Liver surface is diffusely irregular, compatible with hepatic cirrhosis. Mild diffuse hepatic steatosis. There is a large poorly marginated mass essentially replacing the right liver lobe, measuring up to 15.9 x 11.3 cm (series 3/image 13). There is expansile occlusive thrombosis of the main, right and left portal veins contiguous with this mass, most compatible with tumor thrombus. Gallbladder is filled with stones and sludge with marked  diffuse gallbladder wall thickening. No biliary ductal dilatation. Common bile duct diameter 5 mm. No choledocholithiasis. Pancreas: No pancreatic mass or duct dilation.  No pancreas divisum. Spleen: Moderate splenomegaly (craniocaudal splenic length 18.1 cm). T2 hyperintense 1.5 cm superior splenic mass is not significantly changed since 2016 MRI, most compatible with a benign mass. No new splenic mass. Adrenals/Urinary Tract: Normal adrenals. No hydronephrosis. Normal kidneys with no renal mass. Stomach/Bowel: Normal non-distended stomach. Normal caliber visualized small and large bowel. Suggestion of mild wall thickening throughout the small bowel. Vascular/Lymphatic: Infrarenal 3.2 cm abdominal aortic aneurysm, increased from 2.6 cm on 08/11/2014 MRI. Mildly enlarged 1.7 cm portacaval node (series 16/image 55). Mildly enlarged right pericaval, aortocaval and left para-aortic nodes up to 1.1 cm in the left periaortic chain (series 16/image 70). Other: Moderate volume ascites.  No focal fluid collection. Musculoskeletal: No aggressive appearing focal osseous lesions. IMPRESSION: 1. Hepatic cirrhosis. Large poorly marginated liver mass essentially replacing the right liver lobe, contiguous with expansile occlusive thrombus within the main, right and left portal veins. Although this study was performed without IV contrast, the findings are compatible with advanced recurrent hepatocellular carcinoma. Repeat MRI abdomen without and  with IV contrast is recommended for improved characterization of this process. 2. Moderate splenomegaly.  Moderate volume ascites. 3. Cholelithiasis. Marked diffuse gallbladder wall thickening, nonspecific but more likely noninflammatory. 4. Mild diffuse small bowel wall thickening, nonspecific probably due to noninflammatory edema. 5. Nonspecific portacaval and retroperitoneal adenopathy, cannot exclude metastatic disease. 6. Infrarenal 3.2 cm Abdominal Aortic Aneurysm (ICD10-I71.9). Recommend follow-up aortic ultrasound in 3 years. This recommendation follows ACR consensus guidelines: White Paper of the ACR Incidental Findings Committee II on Vascular Findings. J Am Coll Radiol 2013; 10:789-794. Electronically Signed   By: Ilona Sorrel M.D.   On: 10/23/2017 00:00   US Abdomen Limited Ruq  Result Date: 10/22/2017 CLINICAL DATA:  Elevated LFTs, right upper quadrant pain EXAM: ULTRASOUND ABDOMEN LIMITED RIGHT UPPER QUADRANT COMPARISON:  CT 11/11/2014 FINDINGS: Gallbladder: Multiple stones within the gallbladder, the largest 7 mm. Sludge fills the gallbladder. Wall is thickened at 9 mm. No tenderness reported over the gallbladder during the study. Common bile duct: Diameter: Normal caliber, 4 mm Liver: The main portal vein is distended and appears thrombosed. Focal hepatic lesion posteriorly within the right hepatic lobe measures 5.2 x 3.3 x 5.0 cm. Heterogeneous echotexture throughout the liver. Small amount of adjacent ascites. IMPRESSION: Cholelithiasis and sludge within the gallbladder. Gallbladder wall is markedly thickened measuring 9 mm. Cannot exclude cholecystitis. Portal vein thrombosis. Heterogeneous echotexture throughout the liver with focal 5.2 cm hypoechoic solid lesion in the posterior right hepatic lobe. Recommend further evaluation with liver protocol MRI. Small amount of perihepatic ascites. Electronically Signed   By: Rolm Baptise M.D.   On: 10/22/2017 18:31     ASSESSMENT AND PLAN:    62 year old male with a history of liver cirrhosis, HCC, EtOH abuse and hepatitis who presented to the emergency room with abdominal pain.   1.  Portal vein thrombosis: Continue heparin and Coumadin.  Await INR to be therapeutic for discharge.  Appreciate GI and vascular surgery consultation. Monitor CBC closely  2.  History of CAD/stent: Cardiology consultation as patient is on aspirin, Plavix and Coumadin Continue PPI Continue metoprolol  3.  Liver cirrhosis/HCC: Gi and oncology follow up MRCP shows cholelithiasis and no evidence of cholecystitis.  Discontinue IV antibiotics.  Surgery consultation appreciated  4. 3.2 cm Abdominal Aortic Aneurysm (ICD10-I71.9). Recommend follow-up aortic ultrasound in 3 years  5.Tobacco dependence: Patient is encouraged to quit smoking. Counseling was provided for 4 minutes.   Management plans discussed with the patient and he is in agreement.  CODE STATUS: full  TOTAL TIME TAKING CARE OF THIS PATIENT: 24 minutes.     POSSIBLE D/C tuesday, DEPENDING ON CLINICAL CONDITION.   Kaedance Magos M.D on 10/24/2017 at 11:26 AM  Between 7am to 6pm - Pager - [phone] After 6pm go to [email or web address] - password [email or web address] [facility]  Office  [phone]  CC: Primary care physician; Glendon Axe, MD  Note: This dictation was prepared with Dragon dictation along with smaller phrase technology. Any transcriptional errors that result from this process are unintentional.

## 2017-10-24 NOTE — Plan of Care (Signed)
  Problem: Education: Goal: Knowledge of General Education information will improve Description Including pain rating scale, medication(s)/side effects and non-pharmacologic comfort measures Outcome: Progressing   Problem: Clinical Measurements: Goal: Ability to maintain clinical measurements within normal limits will improve Outcome: Progressing   Problem: Activity: Goal: Risk for activity intolerance will decrease Outcome: Progressing   Problem: Elimination: Goal: Will not experience complications related to bowel motility Outcome: Progressing   Problem: Safety: Goal: Ability to remain free from injury will improve Outcome: Progressing   Problem: Skin Integrity: Goal: Risk for impaired skin integrity will decrease Outcome: Progressing   

## 2017-10-24 NOTE — Progress Notes (Signed)
ANTICOAGULATION CONSULT NOTE - Initial Consult  Pharmacy Consult for warfarin Indication: portal vein thrombosis  Allergies  Allergen Reactions  . Multihance [Gadobenate] Hives and Shortness Of Breath  . Hydrocodone Itching and Nausea And Vomiting  . Other Itching    multihance MRI dye  . Vicodin [Hydrocodone-Acetaminophen] Nausea Only and Nausea And Vomiting    Vomiting     Patient Measurements: Height: 6' (182.9 cm) Weight: (Unable to get accurate reading on PT bed) IBW/kg (Calculated) : 77.6 Heparin Dosing Weight:   Vital Signs: Temp: 97.6 F (36.4 C) (09/28 0355) Temp Source: Oral (09/28 0355) BP: 114/79 (09/28 0355) Pulse Rate: 81 (09/28 0355)  Labs: Recent Labs    10/22/17 1131 10/22/17 2053  10/23/17 0405 10/23/17 1352 10/23/17 1944 10/24/17 0408  HGB 12.2*  --   --  11.5*  --   --  11.2*  HCT 36.2*  --   --  33.9*  --   --  33.1*  PLT 167  --   --  142*  --   --  141*  APTT  --  32  --   --   --   --   --   LABPROT  --  15.6*  --   --  16.0*  --  17.0*  INR  --  1.25  --   --  1.29  --  1.40  HEPARINUNFRC  --   --    < > 0.13* 0.33 0.40 0.42  CREATININE 1.04  --   --  1.11  --   --  1.01   < > = values in this interval not displayed.    Estimated Creatinine Clearance: 75.9 mL/min (by C-G formula based on SCr of 1.01 mg/dL).   Medical History: Past Medical History:  Diagnosis Date  . Alcohol abuse   . Anorexia    has trouble eating due to poor appetite  . Anxiety    trouble sleeping; lost two sisters and his mother close together  . Arthritis    hands, feet (burning, stinging)  . Cancer [facility])    liver lesion initial staging.  . CHF (congestive heart failure) ([place])   . Cirrhosis of liver ([place])   . Cocaine abuse ([place])   . COPD (chronic obstructive pulmonary disease) ([place])   . Coronary artery disease    Inferior ST elevation myocardial infarction in February 2019.  Cardiac catheterization showed subtotal occlusion of mid right coronary artery  with severe disease affecting the LAD and left circumflex.  Successful PCI and 2 drug-eluting stent placement to the RCA.  He returned a few days after hospital discharge with stent thrombosis due to not taking any of his cardiac medications.   Marland Kitchen GERD (gastroesophageal reflux disease)   . H/O dizziness   . Headache   . Hepatitis    B and C (chronic)  . Marijuana use   . Myocardial infarction ([place])    massive MI at age 4  . Neuropathy   . Numbness and tingling    legs and feet bilat   . Pneumonia   . Repeated falls   . Shortness of breath dyspnea   . Tinnitus   . Tobacco abuse     Medications:  Infusions:  . heparin 1,400 Units/hr (10/24/17 0558)    Assessment: 62 yom cc abdominal pain found to have portal vein thrombosis. Patient is currently on heparin infusion and is going to transition to VKA (since NOACs not indicated for portal vein thrombosis per  vascular). Pharmacy consulted to manage VKA.  DATE INR DOSE 9/27 1.29 10 MG 9/28 1.4  Goal of Therapy:  INR 2-3 Monitor platelets by anticoagulation protocol: Yes   Plan:  INR increased appropriately - will give additional warfarin 10 mg po x 1 this evening and recheck INR daily until therapeutic x 2.  Laural Benes, Pharm.D., BCPS Clinical Pharmacist 10/24/2017,7:43 AM

## 2017-10-25 DIAGNOSIS — I2510 Atherosclerotic heart disease of native coronary artery without angina pectoris: Secondary | ICD-10-CM

## 2017-10-25 DIAGNOSIS — I255 Ischemic cardiomyopathy: Secondary | ICD-10-CM

## 2017-10-25 DIAGNOSIS — I5022 Chronic systolic (congestive) heart failure: Secondary | ICD-10-CM

## 2017-10-25 LAB — CBC
HCT: 35.7 % — ABNORMAL LOW (ref 40.0–52.0)
Hemoglobin: 12 g/dL — ABNORMAL LOW (ref 13.0–18.0)
MCH: 28.7 pg (ref 26.0–34.0)
MCHC: 33.6 g/dL (ref 32.0–36.0)
MCV: 85.3 fL (ref 80.0–100.0)
PLATELETS: 158 10*3/uL (ref 150–440)
RBC: 4.18 MIL/uL — AB (ref 4.40–5.90)
RDW: 20 % — ABNORMAL HIGH (ref 11.5–14.5)
WBC: 5.4 10*3/uL (ref 3.8–10.6)

## 2017-10-25 LAB — HEPARIN LEVEL (UNFRACTIONATED): HEPARIN UNFRACTIONATED: 0.5 [IU]/mL (ref 0.30–0.70)

## 2017-10-25 LAB — PROTIME-INR
INR: 2.19
Prothrombin Time: 24.2 seconds — ABNORMAL HIGH (ref 11.4–15.2)

## 2017-10-25 LAB — GLUCOSE, CAPILLARY: GLUCOSE-CAPILLARY: 120 mg/dL — AB (ref 70–99)

## 2017-10-25 MED ORDER — NICOTINE 21 MG/24HR TD PT24: 21.0000 mg | MEDICATED_PATCH | Freq: Every day | TRANSDERMAL | 0 refills | Status: DC

## 2017-10-25 MED ORDER — LACTULOSE 10 GM/15ML PO SOLN: 30.0000 g | Freq: Two times a day (BID) | ORAL | 0 refills | Status: AC

## 2017-10-25 MED ORDER — WARFARIN SODIUM 2.5 MG PO TABS
2.5000 mg | ORAL_TABLET | Freq: Once | ORAL | Status: DC
  Filled 2017-10-25: qty 1

## 2017-10-25 MED ORDER — WARFARIN SODIUM 2.5 MG PO TABS: 2.5000 mg | ORAL_TABLET | Freq: Every day | ORAL | 0 refills | Status: DC

## 2017-10-25 MED ORDER — ONDANSETRON HCL 4 MG PO TABS: 4.0000 mg | ORAL_TABLET | ORAL | 0 refills | Status: DC | PRN

## 2017-10-25 NOTE — Discharge Summary (Signed)
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 Alcohol abuse   . Anorexia    has trouble eating due to poor appetite  . Anxiety    trouble sleeping; lost two sisters and his mother close together  . Arthritis    hands, feet (burning, stinging)  . Cancer [facility])    liver lesion initial staging.  . CHF (congestive heart failure) ([place])   . Cirrhosis of liver ([place])   . Cocaine abuse ([place])   . COPD (chronic obstructive pulmonary disease) ([place])   . Coronary artery disease    Inferior ST elevation myocardial infarction in February 2019.  Cardiac catheterization showed subtotal occlusion of mid right coronary artery with severe disease affecting the LAD and left circumflex.  Successful PCI and 2 drug-eluting stent placement to the RCA.  He returned a few days after hospital discharge with stent thrombosis due to not taking any of his cardiac medications.   Marland Kitchen GERD (gastroesophageal reflux disease)   . H/O dizziness   . Headache   . Hepatitis    B and C (chronic)  . Marijuana use   . Myocardial infarction ([place])    massive MI at age 27  . Neuropathy   . Numbness and tingling    legs and feet bilat   . Pneumonia   . Repeated falls   . Shortness of breath dyspnea   . Tinnitus   . Tobacco abuse     HOSPITAL COURSE:  62 year old male with a history of liver cirrhosis, HCC, EtOH abuse and hepatitis who presented to the emergency room with abdominal  pain.   1.  Portal vein thrombosis: Patient was evaluated by GI and vascular surgery.  Recommendations are to start anticoagulation.  He was on heparin and Coumadin.  Patient will be discharged on oral Coumadin.  INR needs to be checked on Tuesday.  Goal INR is 2-3. INR should be monitored by PCPs office until he is established by oncology.. 2.  History of CAD/stent: She was eval by cardiology given the fact the patient is on aspirin, Plavix and Coumadin.  Recommendations are for only Plavix and Coumadin.  He needs periodic monitoring of CBC.  He will follow-up with cardiology in a few weeks. If bleeding becomes a problem Plavix may need to be discontinued.  He had a recent STEMI February 2019.   3.  Liver cirrhosis/HCC: Patient will need outpatient follow-up with oncology.  MRCP shows cholelithiasis and no evidence of cholecystitis.  IV antibiotics were discontinued.  Patient was evaluated by surgery while patient was in the hospital.  4. 3.2 cm Abdominal Aortic Aneurysm (ICD10-I71.9). Recommend follow-up aortic ultrasound in 3 years He will require outpatient vascular surgery follow-up.   5.Tobacco dependence: Patient is encouraged to quit smoking. Counseling was provided for 4 minutes  6.  Chronic systolic heart failure due to ischemic cardiomyopathy: Patient has been euvolemic and well compensated.  Patient will continue on metoprolol and lisinopril.  Further titration  deferred to outpatient follow-up with Dr. Fletcher Anon.   DISCHARGE CONDITIONS AND DIET:  Stable regular diet  CONSULTS OBTAINED:  Treatment Team:  Jonathon Bellows, MD Schnier, Dolores Lory, MD  DRUG ALLERGIES:   Allergies  Allergen Reactions  . Multihance [Gadobenate] Hives and Shortness Of Breath  . Hydrocodone Itching and Nausea And Vomiting  . Other Itching    multihance MRI dye  . Vicodin [Hydrocodone-Acetaminophen] Nausea Only and Nausea And Vomiting    Vomiting     DISCHARGE MEDICATIONS:   Allergies as of  10/25/2017      Reactions   Multihance [gadobenate] Hives, Shortness Of Breath   Hydrocodone Itching, Nausea And Vomiting   Other Itching   multihance MRI dye   Vicodin [hydrocodone-acetaminophen] Nausea Only, Nausea And Vomiting   Vomiting      Medication List    STOP taking these medications   aspirin 81 MG chewable tablet     TAKE these medications   atorvastatin 10 MG tablet Commonly known as:  LIPITOR Take 1 tablet (10 mg total) by mouth daily at 6 PM.   clopidogrel 75 MG tablet Commonly known as:  PLAVIX Take 1 tablet (75 mg total) by mouth daily with breakfast.   gabapentin 800 MG tablet Commonly known as:  NEURONTIN Take 800 mg by mouth 4 (four) times daily.   lactulose 10 GM/15ML solution Commonly known as:  CHRONULAC Take 45 mLs (30 g total) by mouth 2 (two) times daily.   lisinopril 2.5 MG tablet Commonly known as:  PRINIVIL,ZESTRIL Take 1 tablet (2.5 mg total) by mouth daily.   metoprolol succinate 25 MG 24 hr tablet Commonly known as:  TOPROL-XL Take 0.5 tablets (12.5 mg total) by mouth daily.   nicotine 21 mg/24hr patch Commonly known as:  NICODERM CQ - dosed in mg/24 hours Place 1 patch (21 mg total) onto the skin daily. Start taking on:  10/26/2017   nitroGLYCERIN 0.4 MG SL tablet Commonly known as:  NITROSTAT Place 0.4 mg under the tongue every 5 (five) minutes as needed for chest pain.   ondansetron 4 MG tablet Commonly known as:  ZOFRAN Take 1 tablet (4 mg total) by mouth every 4 (four) hours as needed for nausea.   pantoprazole 40 MG tablet Commonly known as:  PROTONIX Take 1 tablet (40 mg total) by mouth daily.   warfarin 2.5 MG tablet Commonly known as:  COUMADIN Take 1 tablet (2.5 mg total) by mouth daily.         Today   CHIEF COMPLAINT:  Doing well ready to go home toelrating diet  VITAL SIGNS:  Blood pressure 123/79, pulse 83, temperature 98.2 F (36.8 C), temperature source Oral, resp. rate 18, height 6' (1.829 m),  weight 74.8 kg, SpO2 98 %.   REVIEW OF SYSTEMS:  Review of Systems  Constitutional: Negative.  Negative for chills, fever and malaise/fatigue.  HENT: Negative.  Negative for ear discharge, ear pain, hearing loss, nosebleeds and sore throat.   Eyes: Negative.  Negative for blurred vision and pain.  Respiratory: Negative.  Negative for cough, hemoptysis, shortness of breath and wheezing.   Cardiovascular: Negative.  Negative for chest pain, palpitations and leg swelling.  Gastrointestinal: Negative.  Negative for abdominal pain, blood in stool, diarrhea, nausea and vomiting.  Genitourinary: Negative.  Negative for dysuria.  Musculoskeletal: Negative.  Negative for back pain.  Skin: Negative.   Neurological: Negative for dizziness, tremors, speech change, focal weakness, seizures and headaches.  Endo/Heme/Allergies: Negative.  Does not bruise/bleed  easily.  Psychiatric/Behavioral: Negative.  Negative for depression, hallucinations and suicidal ideas.     PHYSICAL EXAMINATION:  GENERAL:  62 y.o.-year-old patient lying in the bed with no acute distress.  NECK:  Supple, no jugular venous distention. No thyroid enlargement, no tenderness.  LUNGS: Normal breath sounds bilaterally, no wheezing, rales,rhonchi  No use of accessory muscles of respiration.  CARDIOVASCULAR: S1, S2 normal. No murmurs, rubs, or gallops.  ABDOMEN: Soft, non-tender, non-distended. Bowel sounds present. No organomegaly or mass.  EXTREMITIES: No pedal edema, cyanosis, or clubbing.  PSYCHIATRIC: The patient is alert and oriented x 3.  SKIN: No obvious rash, lesion, or ulcer.   DATA REVIEW:   CBC Recent Labs  Lab 10/25/17 0401  WBC 5.4  HGB 12.0*  HCT 35.7*  PLT 158    Chemistries  Recent Labs  Lab 10/24/17 0408  NA 134*  K 3.7  CL 101  CO2 26  GLUCOSE 99  BUN 10  CREATININE 1.01  CALCIUM 8.6*  AST 163*  ALT 52*  ALKPHOS 175*  BILITOT 1.6*    Cardiac Enzymes No results for input(s): TROPONINI  in the last 168 hours.  Microbiology Results  @MICRORSLT48 @  RADIOLOGY:  No results found.    Allergies as of 10/25/2017      Reactions   Multihance [gadobenate] Hives, Shortness Of Breath   Hydrocodone Itching, Nausea And Vomiting   Other Itching   multihance MRI dye   Vicodin [hydrocodone-acetaminophen] Nausea Only, Nausea And Vomiting   Vomiting      Medication List    STOP taking these medications   aspirin 81 MG chewable tablet     TAKE these medications   atorvastatin 10 MG tablet Commonly known as:  LIPITOR Take 1 tablet (10 mg total) by mouth daily at 6 PM.   clopidogrel 75 MG tablet Commonly known as:  PLAVIX Take 1 tablet (75 mg total) by mouth daily with breakfast.   gabapentin 800 MG tablet Commonly known as:  NEURONTIN Take 800 mg by mouth 4 (four) times daily.   lactulose 10 GM/15ML solution Commonly known as:  CHRONULAC Take 45 mLs (30 g total) by mouth 2 (two) times daily.   lisinopril 2.5 MG tablet Commonly known as:  PRINIVIL,ZESTRIL Take 1 tablet (2.5 mg total) by mouth daily.   metoprolol succinate 25 MG 24 hr tablet Commonly known as:  TOPROL-XL Take 0.5 tablets (12.5 mg total) by mouth daily.   nicotine 21 mg/24hr patch Commonly known as:  NICODERM CQ - dosed in mg/24 hours Place 1 patch (21 mg total) onto the skin daily. Start taking on:  10/26/2017   nitroGLYCERIN 0.4 MG SL tablet Commonly known as:  NITROSTAT Place 0.4 mg under the tongue every 5 (five) minutes as needed for chest pain.   ondansetron 4 MG tablet Commonly known as:  ZOFRAN Take 1 tablet (4 mg total) by mouth every 4 (four) hours as needed for nausea.   pantoprazole 40 MG tablet Commonly known as:  PROTONIX Take 1 tablet (40 mg total) by mouth daily.   warfarin 2.5 MG tablet Commonly known as:  COUMADIN Take 1 tablet (2.5 mg total) by mouth daily.          Management plans discussed with the patient and he is in agreement. Stable for discharge home    Patient should follow up with pcp  CODE STATUS:     Code Status Orders  (From admission, onward)         Start  Ordered   10/23/17 0043  Full code  Continuous     10/23/17 0043        Code Status History    Date Active Date Inactive Code Status Order ID Comments User Context   03/16/2017 2024 03/18/2017 1420 Full Code [id number]  Gorden Harms, MD Inpatient   03/16/2017 2024 03/16/2017 2024 Full Code [id number]  Wellington Hampshire, MD Inpatient   03/09/2017 2103 03/12/2017 1725 Full Code [id number]  Wellington Hampshire, MD Inpatient   01/02/2017 1410 01/03/2017 1330 Full Code [id number]  Vaughan Basta, MD Inpatient      TOTAL TIME TAKING CARE OF THIS PATIENT: 38 minutes.    Note: This dictation was prepared with Dragon dictation along with smaller phrase technology. Any transcriptional errors that result from this process are unintentional.  Sharyn Brilliant M.D on 10/25/2017 at 9:27 AM  Between 7am to 6pm - Pager - [phone] After 6pm go to [email or web address] - password [email or web address] [facility]  Office  [phone]  CC: Primary care physician; Glendon Axe, MD

## 2017-10-25 NOTE — Progress Notes (Signed)
ANTICOAGULATION CONSULT NOTE - Follow up [place] for Heparin Drip Indication: Portal Vein Thrombosis  Allergies  Allergen Reactions  . Multihance [Gadobenate] Hives and Shortness Of Breath  . Hydrocodone Itching and Nausea And Vomiting  . Other Itching    multihance MRI dye  . Vicodin [Hydrocodone-Acetaminophen] Nausea Only and Nausea And Vomiting    Vomiting     Patient Measurements: Height: 6' (182.9 cm) Weight: 165 lb (74.8 kg) IBW/kg (Calculated) : 77.6 Heparin Dosing Weight: 68 kg  Vital Signs: Temp: 98.4 F (36.9 C) (09/29 0545) Temp Source: Oral (09/29 0545) BP: 115/82 (09/29 0545) Pulse Rate: 96 (09/29 0545)  Labs: Recent Labs    10/22/17 1131  10/22/17 2053  10/23/17 0405 10/23/17 1352 10/23/17 1944 10/24/17 0408 10/25/17 0401  HGB 12.2*  --   --   --  11.5*  --   --  11.2* 12.0*  HCT 36.2*  --   --   --  33.9*  --   --  33.1* 35.7*  PLT 167  --   --   --  142*  --   --  141* 158  APTT  --   --  32  --   --   --   --   --   --   LABPROT  --    < > 15.6*  --   --  16.0*  --  17.0* 24.2*  INR  --    < > 1.25  --   --  1.29  --  1.40 2.19  HEPARINUNFRC  --   --   --    < > 0.13* 0.33 0.40 0.42 0.50  CREATININE 1.04  --   --   --  1.11  --   --  1.01  --    < > = values in this interval not displayed.    Estimated Creatinine Clearance: 82.3 mL/min (by C-G formula based on SCr of 1.01 mg/dL).    Assessment: Patient is a 62 yo male admitted with possible portal vein thrombosis. Pharmacy consulted for Heparin dosing.  HEPARIN COURSE DATE TIME HL CHANGE 9/26 21:26 -- Initiate heparin - 4000 unit bolus and 1150 units/hr 9/27 04:05 0.13 RN confirms no interruption - 2000 unit bolus and increase rate to 1400 units/hr 9/27 13:52 0.33 No change 9/27  1944 0.40 No change  Goal of Therapy:  Heparin level 0.3-0.7 units/ml Monitor platelets by anticoagulation protocol: Yes   Plan:  09/29 @ 0500 HL 0.50 therapeutic. Will continue current  rate and will recheck w/ am labs. INR therapeutic x 1.  Tobie Lords, PharmD, BCPS Clinical Pharmacist 10/25/2017

## 2017-10-25 NOTE — Progress Notes (Signed)
Pt has been discharged home. Discharge papers given and explained to pt. Pt verbalized understanding. Meds and f/u appointments reviewed. Rx given. 

## 2017-10-25 NOTE — Progress Notes (Signed)
Family Meeting Note  Advance Directive:no  Today a meeting took place with the Patient.  The following clinical team members were present during this meeting:MD  The following were discussed:Patient's diagnosis: PV thrombosis cirrhopsis [place], Patient's progosis: Unable to determine and Goals for treatment: Full Code  Additional follow-up to be provided: chaplain consult advanced directives  Time spent during discussion:16 minutes  Aniello Christopoulos, MD

## 2017-10-25 NOTE — Care Management Note (Signed)
Case Management Note  Patient Details  Name: Phillip Warren MRN: [id number] Date of Birth: 1955-08-29  Subjective/Objective:    Patient to be discharged per MD order. Orders in place for home health services. Patient refuses home health. Expresses no need for DME. Provided info on obtaining Home health through his primary MD if needed. Transport is here now. Merrily Pew Townes Fuhs RN BSN RNCM [phone]                  Action/Plan:   Expected Discharge Date:  10/25/17               Expected Discharge Plan:     In-House Referral:     Discharge planning Services  CM Consult  Post Acute Care Choice:    Choice offered to:     DME Arranged:    DME Agency:     HH Arranged:  Patient Refused [place] Agency:     Status of Service:  Completed, signed off  If discussed at [employer] of Stay Meetings, dates discussed:    Additional Comments:  Latanya Maudlin, RN 10/25/2017, 10:35 AM

## 2017-10-25 NOTE — Progress Notes (Signed)
Pt is on a low bed.  Pt refuses bed to be in low position and refuses bed alarm and educated.

## 2017-10-25 NOTE — Consult Note (Signed)
Cardiology Consultation:   Patient ID: Phillip Warren MRN: [id number]; DOB: [birth year]  Admit date: 10/22/2017 Date of Consult: 10/25/2017  Primary Care Provider: Glendon Axe, MD Primary Cardiologist: Kathlyn Sacramento, MD  Primary Electrophysiologist:  None    Patient Profile:   Phillip Warren is a 61 y.o. male with a hx of coronary artery disease with inferior STEMI s/p PCI to the RCA in 07/8293 complicated by subacute stent thrombosis due to non-compliance with antiplatelet therapy requiring repeat PCI, chronic systolic heart failure due to ischemic cardiomyopathy, liver cirrhosis with prior GI bleed, prior alcohol abuse, chronic anorexia, anxiety, severe peripheral neuropathy, previous liver cancer which was treated, previous cocaine use, COPD with tobacco use and hepatitis C who is being seen today for the evaluation of antiplatelet therapy in the setting of portal vein thrombosis at the request of Dr. Benjie Karvonen.  History of Present Illness:   Phillip Warren Phillip Warren reports several weeks constant epigastric and mid abdominal discomfort and frequent nausea.  This led him to come to the emergency department on 10/22/2017.  Work-up was notable for mild transaminitis and elevated alkaline phosphatase.  Portal vein thrombosis well as cholelithiasis and gallbladder sludge with accompanying gallbladder wall thickening were identified.  Patient has been evaluated by general surgery, vascular surgery, and gastroenterology.  Due to portal vein thrombosis, anticoagulation with warfarin has been recommended.  There is no plan for surgical intervention at this time.  From a heart standpoint, Phillip Warren has been doing well.  He denies chest pain, shortness of breath, palpitations, lightheadedness, and edema.  He reports being compliant with his medications, though he is unclear about whether or not he has actually been taking aspirin.  He remains compliant with clopidogrel.  He denies bleeding.  Patient  continues to smoke but would like to quit.  He has still been drinking intermittently but is adamant that he will stop.  Past Medical History:  Diagnosis Date  . Alcohol abuse   . Anorexia    has trouble eating due to poor appetite  . Anxiety    trouble sleeping; lost two sisters and his mother close together  . Arthritis    hands, feet (burning, stinging)  . Cancer [facility])    liver lesion initial staging.  . CHF (congestive heart failure) ([place])   . Cirrhosis of liver ([place])   . Cocaine abuse ([place])   . COPD (chronic obstructive pulmonary disease) ([place])   . Coronary artery disease    Inferior ST elevation myocardial infarction in February 2019.  Cardiac catheterization showed subtotal occlusion of mid right coronary artery with severe disease affecting the LAD and left circumflex.  Successful PCI and 2 drug-eluting stent placement to the RCA.  He returned a few days after hospital discharge with stent thrombosis due to not taking any of his cardiac medications.   Marland Kitchen GERD (gastroesophageal reflux disease)   . H/O dizziness   . Headache   . Hepatitis    B and C (chronic)  . Marijuana use   . Myocardial infarction ([place])    massive MI at age 15  . Neuropathy   . Numbness and tingling    legs and feet bilat   . Pneumonia   . Repeated falls   . Shortness of breath dyspnea   . Tinnitus   . Tobacco abuse     Past Surgical History:  Procedure Laterality Date  . CARDIAC SURGERY    . COLONOSCOPY WITH PROPOFOL N/A 08/31/2014   Procedure: COLONOSCOPY WITH  PROPOFOL;  Surgeon: Josefine Class, MD;  Location: [facility] ENDOSCOPY;  Service: Endoscopy;  Laterality: N/A;  . CORONARY ANGIOPLASTY     at age 74 secondary to massive MI  . CORONARY/GRAFT ACUTE MI REVASCULARIZATION N/A 03/09/2017   Procedure: Coronary/Graft Acute MI Revascularization;  Surgeon: Wellington Hampshire, MD;  Location: [place] CV LAB;  Service: Cardiovascular;  Laterality: N/A;  . CORONARY/GRAFT ACUTE MI REVASCULARIZATION  N/A 03/16/2017   Procedure: Coronary/Graft Acute MI Revascularization;  Surgeon: Wellington Hampshire, MD;  Location: [place] CV LAB;  Service: Cardiovascular;  Laterality: N/A;  balloon only RCA  . ESOPHAGOGASTRODUODENOSCOPY (EGD) WITH PROPOFOL N/A 08/31/2014   Procedure: ESOPHAGOGASTRODUODENOSCOPY (EGD) WITH PROPOFOL;  Surgeon: Josefine Class, MD;  Location: [facility] ENDOSCOPY;  Service: Endoscopy;  Laterality: N/A;  . ESOPHAGOGASTRODUODENOSCOPY (EGD) WITH PROPOFOL N/A 01/02/2017   Procedure: ESOPHAGOGASTRODUODENOSCOPY (EGD) WITH PROPOFOL;  Surgeon: Toledo, Benay Pike, MD;  Location: [facility] ENDOSCOPY;  Service: Gastroenterology;  Laterality: N/A;  . LEFT HEART CATH AND CORONARY ANGIOGRAPHY N/A 03/09/2017   Procedure: LEFT HEART CATH AND CORONARY ANGIOGRAPHY;  Surgeon: Wellington Hampshire, MD;  Location: [place] CV LAB;  Service: Cardiovascular;  Laterality: N/A;  . LEFT HEART CATH AND CORONARY ANGIOGRAPHY N/A 03/16/2017   Procedure: LEFT HEART CATH AND CORONARY ANGIOGRAPHY;  Surgeon: Wellington Hampshire, MD;  Location: [place] CV LAB;  Service: Cardiovascular;  Laterality: N/A;     Home Medications:  Prior to Admission medications   Medication Sig Start Date [place] Date Taking? Authorizing Provider  aspirin 81 MG chewable tablet Chew 1 tablet (81 mg total) by mouth daily. 03/19/17  Yes [name], MD  atorvastatin (LIPITOR) 10 MG tablet Take 1 tablet (10 mg total) by mouth daily at 6 PM. 07/31/17  Yes Wellington Hampshire, MD  clopidogrel (PLAVIX) 75 MG tablet Take 1 tablet (75 mg total) by mouth daily with breakfast. 03/24/17  Yes Wellington Hampshire, MD  gabapentin (NEURONTIN) 800 MG tablet Take 800 mg by mouth 4 (four) times daily.   Yes [provider]  lisinopril (PRINIVIL,ZESTRIL) 2.5 MG tablet Take 1 tablet (2.5 mg total) by mouth daily. 03/24/17  Yes Wellington Hampshire, MD  metoprolol succinate (TOPROL-XL) 25 MG 24 hr tablet Take 0.5 tablets (12.5 mg total) by mouth daily. 04/20/17  Yes  Strader, [place] M, PA-C  pantoprazole (PROTONIX) 40 MG tablet Take 1 tablet (40 mg total) by mouth daily. 03/24/17  Yes Wellington Hampshire, MD  nitroGLYCERIN (NITROSTAT) 0.4 MG SL tablet Place 0.4 mg under the tongue every 5 (five) minutes as needed for chest pain.  05/10/13   [provider]    Inpatient Medications: Scheduled Meds: . aspirin  81 mg Oral Daily  . atorvastatin  10 mg Oral q1800  . clopidogrel  75 mg Oral Q breakfast  . docusate sodium  100 mg Oral BID  . gabapentin  800 mg Oral QID  . lactulose  30 g Oral BID  . lisinopril  2.5 mg Oral Daily  . metoprolol succinate  12.5 mg Oral Daily  . nicotine  21 mg Transdermal Daily  . pantoprazole  40 mg Oral Daily  . Warfarin - Pharmacist Dosing Inpatient   Does not apply q1800   Continuous Infusions: . heparin 1,400 Units/hr (10/25/17 0500)   PRN Meds: bisacodyl, HYDROmorphone (DILAUDID) injection, nitroGLYCERIN, ondansetron **OR** ondansetron (ZOFRAN) IV  Allergies:    Allergies  Allergen Reactions  . Multihance [Gadobenate] Hives and Shortness Of Breath  . Hydrocodone Itching and  Nausea And Vomiting  . Other Itching    multihance MRI dye  . Vicodin [Hydrocodone-Acetaminophen] Nausea Only and Nausea And Vomiting    Vomiting     Social History:   Social History   Socioeconomic History  . Marital status: Widowed    Spouse name: Not on file  . Number of children: Not on file  . Years of education: Not on file  . Highest education level: Not on file  Occupational History  . Not on file  Social Needs  . Financial resource strain: Not on file  . Food insecurity:    Worry: Not on file    Inability: Not on file  . Transportation needs:    Medical: Not on file    Non-medical: Not on file  Tobacco Use  . Smoking status: Current Every Day Smoker    Packs/day: 0.50    Years: 30.00    Pack years: 15.00    Types: Cigarettes  . Smokeless tobacco: Never Used  Substance and Sexual Activity  . Alcohol  use: Yes    Frequency: Never    Comment: "very little"  . Drug use: Yes    Types: Marijuana, Cocaine    Comment: pt states has not used cocaine in 20 years   . Sexual activity: Not on file  Lifestyle  . Physical activity:    Days per week: Not on file    Minutes per session: Not on file  . Stress: Not on file  Relationships  . Social connections:    Talks on phone: Not on file    Gets together: Not on file    Attends religious service: Not on file    Active member of club or organization: Not on file    Attends meetings of clubs or organizations: Not on file    Relationship status: Not on file  . Intimate partner violence:    Fear of current or ex partner: Not on file    Emotionally abused: Not on file    Physically abused: Not on file    Forced sexual activity: Not on file  Other Topics Concern  . Not on file  Social History Narrative  . Not on file    Family History:   Family History  Problem Relation Age of Onset  . CAD Father   . Lung cancer Sister   . CAD Brother   . Heart disease Brother        CABG x 3  . Heart attack Brother        nine heart attacks  . Prostate cancer Neg Hx   . Bladder Cancer Neg Hx   . Kidney cancer Neg Hx      ROS:  Please see the history of present illness. All other ROS reviewed and negative.     Physical Exam/Data:   Vitals:   10/24/17 0931 10/24/17 1242 10/24/17 2020 10/25/17 0545  BP: 106/73 100/89 115/85 115/82  Pulse: 86 79 77 96  Resp: 18 18 18 18   Temp: 98.2 F (36.8 C) 97.6 F (36.4 C) 98.2 F (36.8 C) 98.4 F (36.9 C)  TempSrc: Oral Oral Oral Oral  SpO2: 91% 95% 98% 97%  Weight:    74.8 kg  Height:        Intake/Output Summary (Last 24 hours) at 10/25/2017 0744 Last data filed at 10/25/2017 0500 Gross per 24 hour  Intake 1415.24 ml  Output 1300 ml  Net 115.24 ml   Filed Weights   10/22/17  1114 10/23/17 0536 10/25/17 0545  Weight: 68 kg 69 kg 74.8 kg   Body mass index is 22.38 kg/m.  General:  Well  nourished, well developed, in no acute distress HEENT: normal Lymph: no adenopathy Neck: no JVD Endocrine:  No thryomegaly Vascular: No carotid bruits; 1+ radial pulses bilaterally. Cardiac: Regular rate and rhythm without murmurs. Lungs:  clear to auscultation bilaterally, no wheezing, rhonchi or rales  Abd: Mildly distended with diffuse tenderness.  No rebound or guarding. Ext: no edema Musculoskeletal:  No deformities, BUE and BLE strength normal and equal Skin: warm and dry  Neuro:  CNs 2-12 intact, no focal abnormalities noted Psych:  Normal affect   EKG:  The EKG performed on 10/22/2017 was personally reviewed and demonstrates: Sinus tachycardia with septal Q waves.  Otherwise, no significant abnormality. Telemetry: Patient not currently on telemetry.  Relevant CV Studies: Limited echo (03/17/17): - Limited study for to evaluate for pericardial effusion. - Left ventricle: The cavity size was normal. Wall thickness was   increased in a pattern of mild LVH. Systolic function was   severely reduced. The estimated ejection fraction was in the   range of 25% to 30%. There is severe hypokinesis of the   apicalanteroseptal, anterior, and apical myocardium. There is   severe hypokinesis of the inferior and inferoseptal myocardium. - Mitral valve: There was moderate to severe regurgitation. - Pericardium, extracardiac: There was no pericardial effusion.  Echo (03/10/17): - Left ventricle: The cavity size was normal. There was mild focal   basal hypertrophy of the septum. Systolic function was mildly to   moderately reduced. The estimated ejection fraction was in the   range of 40% to 45%. There is severe hypokinesis of the   mid-apicalanteroseptal, anterior, and apical myocardium. Cannot   exclude hypokinesis of the inferior myocardium. Left ventricular   diastolic function parameters were normal. - Aortic valve: A bicuspid morphology cannot be excluded; mildly   thickened,  moderately calcified leaflets. - Mitral valve: Mildly thickened leaflets . There was mild   regurgitation. - Right ventricle: The cavity size was normal. Systolic function   was normal.  LHC/PCI (03/16/17): 1.  Severe underlying heavily calcified three-vessel coronary artery disease with acute RCA stent thrombosis likely due to noncompliance with dual antiplatelet therapy. 2.  Moderately elevated left ventricular Renesme Kerrigan-diastolic pressure. 3.  Successful balloon angioplasty to the right coronary artery.  Difficult procedure due to heavy calcifications and difficulty advancing balloons.  LHC/PCI (03/09/17): 1.  Severe heavily calcified three-vessel coronary artery disease with the culprit being subtotal occlusion of the right coronary artery with heavy thrombus burden. 2.  Mildly elevated left ventricular Desi Carby-diastolic pressure.  Left ventricular angiography was not performed. 3.  Successful angioplasty and drug-eluting stent placement x2 to the mid right coronary artery.  Very difficult procedure due to heavy calcifications.  This required a buddy wire and multiple balloon inflations.  Laboratory Data:  Chemistry Recent Labs  Lab 10/22/17 1131 10/23/17 0405 10/24/17 0408  NA 132* 135 134*  K 4.3 3.9 3.7  CL 101 101 101  CO2 23 27 26   GLUCOSE 125* 198* 99  BUN 13 12 10   CREATININE 1.04 1.11 1.01  CALCIUM 8.8* 8.7* 8.6*  GFRNONAA >60 >60 >60  GFRAA >60 >60 >60  ANIONGAP 8 7 7     Recent Labs  Lab 10/22/17 1131 10/24/17 0408  PROT 7.9 7.0  ALBUMIN 3.2* 2.9*  AST 186* 163*  ALT 59* 52*  ALKPHOS 209* 175*  BILITOT 1.3* 1.6*  Hematology Recent Labs  Lab 10/23/17 0405 10/24/17 0408 10/25/17 0401  WBC 3.8 4.0 5.4  RBC 3.94* 3.90* 4.18*  HGB 11.5* 11.2* 12.0*  HCT 33.9* 33.1* 35.7*  MCV 85.9 84.9 85.3  MCH 29.1 28.8 28.7  MCHC 33.9 33.9 33.6  RDW 20.0* 19.7* 20.0*  PLT 142* 141* 158   Cardiac EnzymesNo results for input(s): TROPONINI in the last 168 hours. No results  for input(s): TROPIPOC in the last 168 hours.  BNPNo results for input(s): BNP, PROBNP in the last 168 hours.  DDimer No results for input(s): DDIMER in the last 168 hours.  Radiology/Studies:  Dg Chest 2 View  Result Date: 10/22/2017 CLINICAL DATA:  Abdominal pain and swelling 2 weeks with 20 pound weight gain. Pleuritic chest pain. EXAM: CHEST - 2 VIEW COMPARISON:  03/16/2017 and 01/02/2017 FINDINGS: Lungs are adequately inflated without consolidation or effusion. Cardiomediastinal silhouette and remainder of the exam is unchanged. IMPRESSION: No active cardiopulmonary disease. Electronically Signed   By: Marin Olp M.D.   On: 10/22/2017 17:20   Mr Abdomen Mrcp Wo Contrast  Result Date: 10/23/2017 CLINICAL DATA:  Right upper quadrant abdominal pain. Elevated liver function tests. History of cirrhosis and hepatocellular carcinoma status post percutaneous thermal ablation 11/10/2014. Cholelithiasis and gallbladder wall thickening and portal vein thrombosis and posterior right liver mass on sonography performed earlier today. EXAM: MRI ABDOMEN WITHOUT CONTRAST  (INCLUDING MRCP) TECHNIQUE: Multiplanar multisequence MR imaging of the abdomen was performed. Heavily T2-weighted images of the biliary and pancreatic ducts were obtained, and three-dimensional MRCP images were rendered by post processing. COMPARISON:  10/22/2017 abdominal sonogram.  08/11/2014 MRI abdomen. FINDINGS: Lower chest: No acute abnormality at the lung bases. Hepatobiliary: Liver surface is diffusely irregular, compatible with hepatic cirrhosis. Mild diffuse hepatic steatosis. There is a large poorly marginated mass essentially replacing the right liver lobe, measuring up to 15.9 x 11.3 cm (series 3/image 13). There is expansile occlusive thrombosis of the main, right and left portal veins contiguous with this mass, most compatible with tumor thrombus. Gallbladder is filled with stones and sludge with marked diffuse gallbladder wall  thickening. No biliary ductal dilatation. Common bile duct diameter 5 mm. No choledocholithiasis. Pancreas: No pancreatic mass or duct dilation.  No pancreas divisum. Spleen: Moderate splenomegaly (craniocaudal splenic length 18.1 cm). T2 hyperintense 1.5 cm superior splenic mass is not significantly changed since 2016 MRI, most compatible with a benign mass. No new splenic mass. Adrenals/Urinary Tract: Normal adrenals. No hydronephrosis. Normal kidneys with no renal mass. Stomach/Bowel: Normal non-distended stomach. Normal caliber visualized small and large bowel. Suggestion of mild wall thickening throughout the small bowel. Vascular/Lymphatic: Infrarenal 3.2 cm abdominal aortic aneurysm, increased from 2.6 cm on 08/11/2014 MRI. Mildly enlarged 1.7 cm portacaval node (series 16/image 55). Mildly enlarged right pericaval, aortocaval and left para-aortic nodes up to 1.1 cm in the left periaortic chain (series 16/image 70). Other: Moderate volume ascites.  No focal fluid collection. Musculoskeletal: No aggressive appearing focal osseous lesions. IMPRESSION: 1. Hepatic cirrhosis. Large poorly marginated liver mass essentially replacing the right liver lobe, contiguous with expansile occlusive thrombus within the main, right and left portal veins. Although this study was performed without IV contrast, the findings are compatible with advanced recurrent hepatocellular carcinoma. Repeat MRI abdomen without and with IV contrast is recommended for improved characterization of this process. 2. Moderate splenomegaly.  Moderate volume ascites. 3. Cholelithiasis. Marked diffuse gallbladder wall thickening, nonspecific but more likely noninflammatory. 4. Mild diffuse small bowel wall thickening, nonspecific probably due  to noninflammatory edema. 5. Nonspecific portacaval and retroperitoneal adenopathy, cannot exclude metastatic disease. 6. Infrarenal 3.2 cm Abdominal Aortic Aneurysm (ICD10-I71.9). Recommend follow-up aortic  ultrasound in 3 years. This recommendation follows ACR consensus guidelines: White Paper of the ACR Incidental Findings Committee II on Vascular Findings. J Am Coll Radiol 2013; 10:789-794. Electronically Signed   By: Ilona Sorrel M.D.   On: 10/23/2017 00:00   Mr 3d Recon At Scanner  Result Date: 10/23/2017 CLINICAL DATA:  Right upper quadrant abdominal pain. Elevated liver function tests. History of cirrhosis and hepatocellular carcinoma status post percutaneous thermal ablation 11/10/2014. Cholelithiasis and gallbladder wall thickening and portal vein thrombosis and posterior right liver mass on sonography performed earlier today. EXAM: MRI ABDOMEN WITHOUT CONTRAST  (INCLUDING MRCP) TECHNIQUE: Multiplanar multisequence MR imaging of the abdomen was performed. Heavily T2-weighted images of the biliary and pancreatic ducts were obtained, and three-dimensional MRCP images were rendered by post processing. COMPARISON:  10/22/2017 abdominal sonogram.  08/11/2014 MRI abdomen. FINDINGS: Lower chest: No acute abnormality at the lung bases. Hepatobiliary: Liver surface is diffusely irregular, compatible with hepatic cirrhosis. Mild diffuse hepatic steatosis. There is a large poorly marginated mass essentially replacing the right liver lobe, measuring up to 15.9 x 11.3 cm (series 3/image 13). There is expansile occlusive thrombosis of the main, right and left portal veins contiguous with this mass, most compatible with tumor thrombus. Gallbladder is filled with stones and sludge with marked diffuse gallbladder wall thickening. No biliary ductal dilatation. Common bile duct diameter 5 mm. No choledocholithiasis. Pancreas: No pancreatic mass or duct dilation.  No pancreas divisum. Spleen: Moderate splenomegaly (craniocaudal splenic length 18.1 cm). T2 hyperintense 1.5 cm superior splenic mass is not significantly changed since 2016 MRI, most compatible with a benign mass. No new splenic mass. Adrenals/Urinary Tract:  Normal adrenals. No hydronephrosis. Normal kidneys with no renal mass. Stomach/Bowel: Normal non-distended stomach. Normal caliber visualized small and large bowel. Suggestion of mild wall thickening throughout the small bowel. Vascular/Lymphatic: Infrarenal 3.2 cm abdominal aortic aneurysm, increased from 2.6 cm on 08/11/2014 MRI. Mildly enlarged 1.7 cm portacaval node (series 16/image 55). Mildly enlarged right pericaval, aortocaval and left para-aortic nodes up to 1.1 cm in the left periaortic chain (series 16/image 70). Other: Moderate volume ascites.  No focal fluid collection. Musculoskeletal: No aggressive appearing focal osseous lesions. IMPRESSION: 1. Hepatic cirrhosis. Large poorly marginated liver mass essentially replacing the right liver lobe, contiguous with expansile occlusive thrombus within the main, right and left portal veins. Although this study was performed without IV contrast, the findings are compatible with advanced recurrent hepatocellular carcinoma. Repeat MRI abdomen without and with IV contrast is recommended for improved characterization of this process. 2. Moderate splenomegaly.  Moderate volume ascites. 3. Cholelithiasis. Marked diffuse gallbladder wall thickening, nonspecific but more likely noninflammatory. 4. Mild diffuse small bowel wall thickening, nonspecific probably due to noninflammatory edema. 5. Nonspecific portacaval and retroperitoneal adenopathy, cannot exclude metastatic disease. 6. Infrarenal 3.2 cm Abdominal Aortic Aneurysm (ICD10-I71.9). Recommend follow-up aortic ultrasound in 3 years. This recommendation follows ACR consensus guidelines: White Paper of the ACR Incidental Findings Committee II on Vascular Findings. J Am Coll Radiol 2013; 10:789-794. Electronically Signed   By: Ilona Sorrel M.D.   On: 10/23/2017 00:00   US Abdomen Limited Ruq  Result Date: 10/22/2017 CLINICAL DATA:  Elevated LFTs, right upper quadrant pain EXAM: ULTRASOUND ABDOMEN LIMITED RIGHT  UPPER QUADRANT COMPARISON:  CT 11/11/2014 FINDINGS: Gallbladder: Multiple stones within the gallbladder, the largest 7 mm. Sludge fills the gallbladder.  Wall is thickened at 9 mm. No tenderness reported over the gallbladder during the study. Common bile duct: Diameter: Normal caliber, 4 mm Liver: The main portal vein is distended and appears thrombosed. Focal hepatic lesion posteriorly within the right hepatic lobe measures 5.2 x 3.3 x 5.0 cm. Heterogeneous echotexture throughout the liver. Small amount of adjacent ascites. IMPRESSION: Cholelithiasis and sludge within the gallbladder. Gallbladder wall is markedly thickened measuring 9 mm. Cannot exclude cholecystitis. Portal vein thrombosis. Heterogeneous echotexture throughout the liver with focal 5.2 cm hypoechoic solid lesion in the posterior right hepatic lobe. Recommend further evaluation with liver protocol MRI. Small amount of perihepatic ascites. Electronically Signed   By: Rolm Baptise M.D.   On: 10/22/2017 18:31    Assessment and Plan:   Portal vein thrombus Incidentally noted during work-up of abdominal pain and nausea.  Anticoagulation with warfarin has been recommended by gastroenterology.  Patient is currently on heparin infusion.  INR is therapeutic today.  I think it is reasonable to anticoagulate with warfarin, as patient has not had any significant bleeding.  Once INR has maintained therapeutic range (2-3), aspirin can be discontinued.  I favor continuing clopidogrel for at least 12 months from the time of most recent STEMI on 03/17/2017.  Dr. Genia Harold indicates that INR will be followed by Mr. Dallman PCP (Dr. Candiss Norse)  Coronary artery disease without angina No symptoms to suggest worsening coronary insufficiency.  Continue secondary prevention.  As above, discontinue aspirin when INR has remained therapeutic.  Continue warfarin and clopidogrel through 02/2018, if possible.  If bleeding becomes a problem, clopidogrel may need to be  discontinued sooner though I am reluctant to do this given history of stent subacute thrombosis.  Chronic systolic heart failure secondary to ischemic cardiomyopathy Mr. Williamsen appears euvolemic and is reasonably well compensated with NYHA class II-III symptoms.  Continue current doses of metoprolol succinate and lisinopril.  Further dose titration deferred to outpatient follow-up with Dr. Fletcher Anon.  [facility] will sign off.   Medication Recommendations: Discontinue aspirin when INR therapeutic.  Continue warfarin and clopidogrel. Other recommendations (labs, testing, etc): INR check early next week with PCP. Follow up as an outpatient: Return to see Dr. Fletcher Anon as planned on 12/03/17.  For questions or updates, please contact [place] Please consult [email or web address] for contact info under [facility].  Signed, Nelva Bush, MD  10/25/2017 7:44 AM

## 2017-10-25 NOTE — Progress Notes (Addendum)
ANTICOAGULATION CONSULT NOTE - Initial Consult  Pharmacy Consult for warfarin Indication: portal vein thrombosis  Allergies  Allergen Reactions  . Multihance [Gadobenate] Hives and Shortness Of Breath  . Hydrocodone Itching and Nausea And Vomiting  . Other Itching    multihance MRI dye  . Vicodin [Hydrocodone-Acetaminophen] Nausea Only and Nausea And Vomiting    Vomiting     Patient Measurements: Height: 6' (182.9 cm) Weight: 165 lb (74.8 kg) IBW/kg (Calculated) : 77.6 Heparin Dosing Weight:   Vital Signs: Temp: 98.4 F (36.9 C) (09/29 0545) Temp Source: Oral (09/29 0545) BP: 115/82 (09/29 0545) Pulse Rate: 96 (09/29 0545)  Labs: Recent Labs    10/22/17 1131  10/22/17 2053  10/23/17 0405 10/23/17 1352 10/23/17 1944 10/24/17 0408 10/25/17 0401  HGB 12.2*  --   --   --  11.5*  --   --  11.2* 12.0*  HCT 36.2*  --   --   --  33.9*  --   --  33.1* 35.7*  PLT 167  --   --   --  142*  --   --  141* 158  APTT  --   --  32  --   --   --   --   --   --   LABPROT  --    < > 15.6*  --   --  16.0*  --  17.0* 24.2*  INR  --    < > 1.25  --   --  1.29  --  1.40 2.19  HEPARINUNFRC  --   --   --    < > 0.13* 0.33 0.40 0.42 0.50  CREATININE 1.04  --   --   --  1.11  --   --  1.01  --    < > = values in this interval not displayed.    Estimated Creatinine Clearance: 82.3 mL/min (by C-G formula based on SCr of 1.01 mg/dL).   Medical History: Past Medical History:  Diagnosis Date  . Alcohol abuse   . Anorexia    has trouble eating due to poor appetite  . Anxiety    trouble sleeping; lost two sisters and his mother close together  . Arthritis    hands, feet (burning, stinging)  . Cancer [facility])    liver lesion initial staging.  . CHF (congestive heart failure) ([place])   . Cirrhosis of liver ([place])   . Cocaine abuse ([place])   . COPD (chronic obstructive pulmonary disease) ([place])   . Coronary artery disease    Inferior ST elevation myocardial infarction in February 2019.   Cardiac catheterization showed subtotal occlusion of mid right coronary artery with severe disease affecting the LAD and left circumflex.  Successful PCI and 2 drug-eluting stent placement to the RCA.  He returned a few days after hospital discharge with stent thrombosis due to not taking any of his cardiac medications.   Marland Kitchen GERD (gastroesophageal reflux disease)   . H/O dizziness   . Headache   . Hepatitis    B and C (chronic)  . Marijuana use   . Myocardial infarction ([place])    massive MI at age 89  . Neuropathy   . Numbness and tingling    legs and feet bilat   . Pneumonia   . Repeated falls   . Shortness of breath dyspnea   . Tinnitus   . Tobacco abuse     Medications:  Infusions:  . heparin 1,400 Units/hr (10/25/17 0500)  Assessment: 62 yom cc abdominal pain found to have portal vein thrombosis. Patient is currently on heparin infusion and is going to transition to VKA (since NOACs not indicated for portal vein thrombosis per vascular). Pharmacy consulted to manage VKA.  DATE INR DOSE 9/27 1.29 10 MG 9/28 1.4 10 MG 9/29 2.19  Goal of Therapy:  INR 2-3 Monitor platelets by anticoagulation protocol: Yes   Plan:  INR jumped to 2.19 this morning. - will give  warfarin 2.5 mg po x 1 this evening and recheck INR daily until therapeutic x 2. Plan to discontinue heparin in the AM if INR therapeutic x 2 days.   Jaylynne Birkhead M Charae Depaolis, Pharm.D., BCPS Clinical Pharmacist 10/25/2017,7:59 AM

## 2017-10-25 NOTE — Plan of Care (Signed)
Patient in bed, not in any form of distress. Complaints of abdominal discomfort and pain intermittently, medicated with PRN pain medicine. Heparin drip infusing at 1,400U per hour based on pharmacy dosing. Kept safe and comfortable, on bleeding precautions. We'll continue to monitor.

## 2017-11-10 ENCOUNTER — Other Ambulatory Visit: Payer: Self-pay

## 2017-11-10 ENCOUNTER — Other Ambulatory Visit: Payer: Self-pay | Admitting: Oncology

## 2017-11-10 ENCOUNTER — Emergency Department
Admission: EM | Admit: 2017-11-10 | Discharge: 2017-11-10 | Disposition: A | Discharge status: Home / Self Care | Payer: Medicare Other | Attending: Emergency Medicine | Admitting: Emergency Medicine

## 2017-11-10 ENCOUNTER — Encounter: Payer: Self-pay | Admitting: Emergency Medicine

## 2017-11-10 DIAGNOSIS — I2510 Atherosclerotic heart disease of native coronary artery without angina pectoris: Secondary | ICD-10-CM | POA: Insufficient documentation

## 2017-11-10 DIAGNOSIS — M545 Low back pain, unspecified: Secondary | ICD-10-CM

## 2017-11-10 DIAGNOSIS — Z7901 Long term (current) use of anticoagulants: Secondary | ICD-10-CM | POA: Insufficient documentation

## 2017-11-10 DIAGNOSIS — C220 Liver cell carcinoma: Secondary | ICD-10-CM | POA: Diagnosis not present

## 2017-11-10 DIAGNOSIS — Z79899 Other long term (current) drug therapy: Secondary | ICD-10-CM | POA: Insufficient documentation

## 2017-11-10 DIAGNOSIS — J449 Chronic obstructive pulmonary disease, unspecified: Secondary | ICD-10-CM | POA: Insufficient documentation

## 2017-11-10 DIAGNOSIS — K7031 Alcoholic cirrhosis of liver with ascites: Secondary | ICD-10-CM | POA: Insufficient documentation

## 2017-11-10 LAB — COMPREHENSIVE METABOLIC PANEL
ALBUMIN: 3.3 g/dL — AB (ref 3.5–5.0)
ALT: 43 U/L (ref 0–44)
ANION GAP: 9 (ref 5–15)
AST: 186 U/L — ABNORMAL HIGH (ref 15–41)
Alkaline Phosphatase: 183 U/L — ABNORMAL HIGH (ref 38–126)
BILIRUBIN TOTAL: 2.3 mg/dL — AB (ref 0.3–1.2)
BUN: 11 mg/dL (ref 6–20)
CO2: 25 mmol/L (ref 22–32)
Calcium: 9.5 mg/dL (ref 8.9–10.3)
Chloride: 99 mmol/L (ref 98–111)
Creatinine, Ser: 1.1 mg/dL (ref 0.61–1.24)
GFR calc non Af Amer: >60 mL/min (ref >60)
GLUCOSE: 144 mg/dL — AB (ref 70–99)
POTASSIUM: 4.6 mmol/L (ref 3.5–5.1)
Sodium: 133 mmol/L — ABNORMAL LOW (ref 135–145)
TOTAL PROTEIN: 8.5 g/dL — AB (ref 6.5–8.1)

## 2017-11-10 LAB — URINALYSIS, COMPLETE (UACMP) WITH MICROSCOPIC
BACTERIA UA: NONE SEEN
BILIRUBIN URINE: NEGATIVE
Glucose, UA: NEGATIVE mg/dL
Ketones, ur: 5 mg/dL — AB
LEUKOCYTES UA: NEGATIVE
Nitrite: NEGATIVE
PH: 5 (ref 5.0–8.0)
Protein, ur: NEGATIVE mg/dL
RBC / HPF: >50 RBC/hpf — ABNORMAL HIGH (ref 0–5)
SPECIFIC GRAVITY, URINE: 1.02 (ref 1.005–1.030)
Squamous Epithelial / LPF: NONE SEEN (ref 0–5)
Waxy Casts, UA: 2

## 2017-11-10 LAB — APTT: APTT: 34 s (ref 24–36)

## 2017-11-10 LAB — LIPASE, BLOOD: Lipase: 27 U/L (ref 11–51)

## 2017-11-10 LAB — CBC
HCT: 38.3 % — ABNORMAL LOW (ref 39.0–52.0)
Hemoglobin: 12.6 g/dL — ABNORMAL LOW (ref 13.0–17.0)
MCH: 27 pg (ref 26.0–34.0)
MCHC: 32.9 g/dL (ref 30.0–36.0)
MCV: 82 fL (ref 80.0–100.0)
Platelets: 211 10*3/uL (ref 150–400)
RBC: 4.67 MIL/uL (ref 4.22–5.81)
RDW: 18.2 % — AB (ref 11.5–15.5)
WBC: 5.2 10*3/uL (ref 4.0–10.5)
nRBC: 0 % (ref 0.0–0.2)

## 2017-11-10 LAB — PROTIME-INR
INR: 1.78
Prothrombin Time: 20.5 seconds — ABNORMAL HIGH (ref 11.4–15.2)

## 2017-11-10 MED ORDER — FENTANYL CITRATE (PF) 100 MCG/2ML IJ SOLN
75.0000 ug | Freq: Once | INTRAMUSCULAR | Status: AC
  Administered 2017-11-10: 75 ug via INTRAVENOUS
  Filled 2017-11-10: qty 2

## 2017-11-10 MED ORDER — MORPHINE SULFATE (PF) 4 MG/ML IV SOLN
4.0000 mg | Freq: Once | INTRAVENOUS | Status: AC
  Administered 2017-11-10: 4 mg via INTRAVENOUS
  Filled 2017-11-10: qty 1

## 2017-11-10 MED ORDER — OXYCODONE HCL 5 MG PO TABS: 5.0000 mg | ORAL_TABLET | ORAL | 0 refills | Status: DC | PRN

## 2017-11-10 MED ORDER — SODIUM CHLORIDE 0.9 % IV BOLUS: 1000.0000 mL | Freq: Once | INTRAVENOUS | Status: DC

## 2017-11-10 MED ORDER — ONDANSETRON 4 MG PO TBDP: 4.0000 mg | ORAL_TABLET | Freq: Three times a day (TID) | ORAL | 0 refills | Status: DC | PRN

## 2017-11-10 MED ORDER — ONDANSETRON HCL 4 MG/2ML IJ SOLN
4.0000 mg | Freq: Once | INTRAMUSCULAR | Status: AC
  Administered 2017-11-10: 4 mg via INTRAVENOUS
  Filled 2017-11-10: qty 2

## 2017-11-10 NOTE — ED Provider Notes (Signed)
[facility] Emergency Department Provider Note  ____________________________________________  Time seen: Approximately 10:47 AM  I have reviewed the triage vital signs and the nursing notes.   HISTORY  Chief Complaint Abdominal Pain    HPI MIRL HILLERY is a 62 y.o. male with a history of alcoholic cirrhosis, hepatitis B and C, with hepatocellular carcinoma, CAD status post STEMI, presenting for back pain, nausea and vomiting, and inability to urinate with abdominal distention.  The patient was discharged 10/25/2017 after admission for biliary colic with portal venous thrombosis and abdominal pain and since discharge has gotten progressively worse.  He reports increasing abdominal distention, inability to urinate due to pain, and multiple daily episodes of nausea and vomiting.  His chronic bilateral and central back pain has been worsening in severity but has not changed in character; he tried some "patches," which did not improve his pain.  He has not had any bleeding including hematemesis, melena or bright red blood per rectum.  He denies any fevers or chills.  He is not currently under treatment for his hepatocellular carcinoma.  Past Medical History:  Diagnosis Date  . Alcohol abuse   . Anorexia    has trouble eating due to poor appetite  . Anxiety    trouble sleeping; lost two sisters and his mother close together  . Arthritis    hands, feet (burning, stinging)  . Cancer [facility])    liver lesion initial staging.  . CHF (congestive heart failure) ([place])   . Cirrhosis of liver ([place])   . Cocaine abuse ([place])   . COPD (chronic obstructive pulmonary disease) ([place])   . Coronary artery disease    Inferior ST elevation myocardial infarction in February 2019.  Cardiac catheterization showed subtotal occlusion of mid right coronary artery with severe disease affecting the LAD and left circumflex.  Successful PCI and 2 drug-eluting stent placement to the RCA.  He  returned a few days after hospital discharge with stent thrombosis due to not taking any of his cardiac medications.   Marland Kitchen GERD (gastroesophageal reflux disease)   . H/O dizziness   . Headache   . Hepatitis    B and C (chronic)  . Marijuana use   . Myocardial infarction ([place])    massive MI at age 70  . Neuropathy   . Numbness and tingling    legs and feet bilat   . Pneumonia   . Repeated falls   . Shortness of breath dyspnea   . Tinnitus   . Tobacco abuse     Patient Active Problem List   Diagnosis Date Noted  . Abdominal pain 10/22/2017  . Cholelithiasis   . COPD (chronic obstructive pulmonary disease) ([place]) 03/09/2017  . GERD (gastroesophageal reflux disease) 03/09/2017  . Alcohol abuse 03/09/2017  . History of GI bleed 03/09/2017  . Acute ST elevation myocardial infarction (STEMI) of inferior wall ([place]) 03/09/2017  . Acute ST elevation myocardial infarction (STEMI) involving right coronary artery ([place])   . GI bleed 01/02/2017  . Hepatic cirrhosis ([place])   . Hepatocellular carcinoma ([name])   . [place] (hepatocellular carcinoma) ([place])   . Hepatitis, viral   . Cancer, hepatocellular ([place]) 08/31/2014  . Absolute anemia 08/26/2014  . Cirrhosis ([place]) 08/26/2014  . Arteriosclerosis of coronary artery 08/26/2014  . HBV (hepatitis B virus) infection 08/26/2014  . HCV (hepatitis C virus) 08/26/2014  . Chronic hepatitis C ([place]) 04/13/2014  . Foot pain 04/13/2014    Past Surgical History:  Procedure Laterality Date  .  CARDIAC SURGERY    . COLONOSCOPY WITH PROPOFOL N/A 08/31/2014   Procedure: COLONOSCOPY WITH PROPOFOL;  Surgeon: Josefine Class, MD;  Location: [facility] ENDOSCOPY;  Service: Endoscopy;  Laterality: N/A;  . CORONARY ANGIOPLASTY     at age 53 secondary to massive MI  . CORONARY/GRAFT ACUTE MI REVASCULARIZATION N/A 03/09/2017   Procedure: Coronary/Graft Acute MI Revascularization;  Surgeon: Wellington Hampshire, MD;  Location: [place] CV LAB;  Service: Cardiovascular;   Laterality: N/A;  . CORONARY/GRAFT ACUTE MI REVASCULARIZATION N/A 03/16/2017   Procedure: Coronary/Graft Acute MI Revascularization;  Surgeon: Wellington Hampshire, MD;  Location: [place] CV LAB;  Service: Cardiovascular;  Laterality: N/A;  balloon only RCA  . ESOPHAGOGASTRODUODENOSCOPY (EGD) WITH PROPOFOL N/A 08/31/2014   Procedure: ESOPHAGOGASTRODUODENOSCOPY (EGD) WITH PROPOFOL;  Surgeon: Josefine Class, MD;  Location: [facility] ENDOSCOPY;  Service: Endoscopy;  Laterality: N/A;  . ESOPHAGOGASTRODUODENOSCOPY (EGD) WITH PROPOFOL N/A 01/02/2017   Procedure: ESOPHAGOGASTRODUODENOSCOPY (EGD) WITH PROPOFOL;  Surgeon: [place], Benay Pike, MD;  Location: [facility] ENDOSCOPY;  Service: Gastroenterology;  Laterality: N/A;  . LEFT HEART CATH AND CORONARY ANGIOGRAPHY N/A 03/09/2017   Procedure: LEFT HEART CATH AND CORONARY ANGIOGRAPHY;  Surgeon: Wellington Hampshire, MD;  Location: [place] CV LAB;  Service: Cardiovascular;  Laterality: N/A;  . LEFT HEART CATH AND CORONARY ANGIOGRAPHY N/A 03/16/2017   Procedure: LEFT HEART CATH AND CORONARY ANGIOGRAPHY;  Surgeon: Wellington Hampshire, MD;  Location: [place] CV LAB;  Service: Cardiovascular;  Laterality: N/A;    Current Outpatient Rx  . Order #: [id number] Class: Normal  . Order #: [id number] Class: Normal  . Order #: [id number] Class: Historical Med  . Order #: [id number] Class: Normal  . Order #: [id number] Class: Normal  . Order #: [id number] Class: Normal  . Order #: [id number] Class: Print  . Order #: [id number] Class: Historical Med  . Order #: [id number] Class: Print  . Order #: [id number] Class: Print  . Order #: [id number] Class: Print  . Order #: [id number] Class: Normal  . Order #: [id number] Class: Print    Allergies Multihance [gadobenate]; Hydrocodone; Other; and Vicodin [hydrocodone-acetaminophen]  Family History  Problem Relation Age of Onset  . CAD Father   . Lung cancer Sister   . CAD Brother   . Heart disease Brother        CABG x 3  . Heart attack  Brother        nine heart attacks  . Prostate cancer Neg Hx   . Bladder Cancer Neg Hx   . Kidney cancer Neg Hx     Social History Social History   Tobacco Use  . Smoking status: Current Every Day Smoker    Packs/day: 0.50    Years: 30.00    Pack years: 15.00    Types: Cigarettes  . Smokeless tobacco: Never Used  Substance Use Topics  . Alcohol use: Yes    Frequency: Never    Comment: "very little"  . Drug use: Yes    Types: Marijuana    Comment: pt states has not used cocaine in 20 years     Review of Systems Constitutional: No fever/chills.  No lightheadedness or syncope.  Positive general malaise. Eyes: No visual changes. ENT: No sore throat. No congestion or rhinorrhea. Cardiovascular: Denies chest pain. Denies palpitations. Respiratory: Denies shortness of breath.  No cough. Gastrointestinal: Positive abdominal distention and diffuse abdominal pain.  +nausea, +vomiting.  No diarrhea.  No constipation. Genitourinary: Negative for dysuria.  Positive for difficulty with urination. Musculoskeletal: Positive for diffuse back pain. Skin: Negative for rash.  Neurological: Negative for headaches. No focal numbness, tingling or weakness.     ____________________________________________   PHYSICAL EXAM:  VITAL SIGNS: ED Triage Vitals  Enc Vitals Group     BP 11/10/17 1000 125/81     Pulse Rate 11/10/17 1000 (!) 127     Resp 11/10/17 1000 (!) 24     Temp 11/10/17 1000 97.6 F (36.4 C)     Temp Source 11/10/17 1000 Oral     SpO2 11/10/17 1000 99 %     Weight 11/10/17 1001 147 lb (66.7 kg)     Height 11/10/17 1001 6' (1.829 m)     Head Circumference --      Peak Flow --      Pain Score 11/10/17 1001 8     Pain Loc --      Pain Edu? --      Excl. in [place]? --     Constitutional: Alert and oriented. Answers questions appropriately.  Likely ill-appearing and very uncomfortable, moaning with positional changes. Eyes: Conjunctivae are normal.  EOMI. positive mild  scleral icterus. Head: Atraumatic. Nose: No congestion/rhinnorhea. Mouth/Throat: Mucous membranes are very dry.  Neck: No stridor.  Supple.  Positive JVD.  No meningismus. Cardiovascular: Normal rate, regular rhythm. No murmurs, rubs or gallops.  Respiratory: Normal respiratory effort.  No accessory muscle use or retractions. Lungs CTAB.  No wheezes, rales or ronchi. Gastrointestinal: The patient has a distended abdomen with fluid wave and diffuse mild tenderness to palpation..  No guarding or rebound.  No peritoneal signs. Musculoskeletal: Diffuse tenderness on the bilateral sides of the back in the midline over the lumbar spine without focality.  Pain is worse with positional changes.  No LE edema. No ttp in the calves or palpable cords.  Negative Homan's sign. Neurologic:  A&Ox3.  Speech is clear.  Face and smile are symmetric.  EOMI.  Moves all extremities well. Skin:  Skin is warm, dry and intact. No rash noted. Psychiatric: Mood and affect are normal.   ____________________________________________   LABS (all labs ordered are listed, but only abnormal results are displayed)  Labs Reviewed  CBC - Abnormal; Notable for the following components:      Result Value   Hemoglobin 12.6 (*)    HCT 38.3 (*)    RDW 18.2 (*)    All other components within normal limits  COMPREHENSIVE METABOLIC PANEL - Abnormal; Notable for the following components:   Sodium 133 (*)    Glucose, Bld 144 (*)    Total Protein 8.5 (*)    Albumin 3.3 (*)    AST 186 (*)    Alkaline Phosphatase 183 (*)    Total Bilirubin 2.3 (*)    All other components within normal limits  URINALYSIS, COMPLETE (UACMP) WITH MICROSCOPIC - Abnormal; Notable for the following components:   Color, Urine AMBER (*)    APPearance CLEAR (*)    Hgb urine dipstick MODERATE (*)    Ketones, ur 5 (*)    RBC / HPF >50 (*)    All other components within normal limits  PROTIME-INR - Abnormal; Notable for the following components:    Prothrombin Time 20.5 (*)    All other components within normal limits  LIPASE, BLOOD  APTT   ____________________________________________  EKG  ED ECG REPORT I, Anne-Caroline Mariea Clonts, the attending physician, personally viewed and interpreted this ECG.   Date: 11/10/2017  EKG Time: 1009  Rate: 115  Rhythm: sinus tachycardia  Axis: normal  Intervals:none  ST&T Change: No STEMI  ____________________________________________  RADIOLOGY  No results found.  ____________________________________________   PROCEDURES  Procedure(s) performed: None  Procedures  Critical Care performed: No ____________________________________________   INITIAL IMPRESSION / ASSESSMENT AND PLAN / ED COURSE  Pertinent labs & imaging results that were available during my care of the patient were reviewed by me and considered in my medical decision making (see chart for details).  62 y.o. male with cirrhotic liver disease and hepatocellular carcinoma not currently under treatment, CAD history, presenting for diffuse back pain, nausea and vomiting, increasing abdominal distention with diffuse pain.  Overall, the patient does have sinus tachycardia and signs and symptoms consistent with dehydration given his dry mucous membranes.  He does have abdominal distention concerning for increased ascites.  SBP is considered although the patient has not been having fever; he is on Plavix and Coumadin so immediate paracentesis is deferred at this time.  I will follow his laboratory results and have he has an elevated white blood cell count I will give him empiric antibiotics for SBP but this fluid is more likely a septic ascites.  The patient's back pain is nonfocal; aortic pathology is unlikely.  We will check his urine given his urinary retention for UTI.  At this time, symptomatic treatment has been ordered.  We will continue to monitor his clinical progression as well as his laboratory results for ongoing  evaluation.  ----------------------------------------- 12:36 PM on 11/10/2017 -----------------------------------------  The patient's white blood cell count is normal and he continues to be hemo-dynamically stable and afebrile; infection likelihood is very low.  His pain has significantly improved with a single dose of fentanyl.  His laboratory studies are at baseline with an elevated AST and and alk phos as well as an elevated bilirubin which today is 2.3 (last 1.5).  His lipase is normal.  He has not had any GI or oncology follow-up; he has been instructed to set this up for outpatient and has not done so yet.  Today, I am not seeing any evidence of new pathology.  ----------------------------------------- 12:49 PM on 11/10/2017 -----------------------------------------  I have spoken with Dr. Rogers Blocker, the GI physician on-call, who agrees that the patient does not need to be admitted for any further evaluation or work-up.  His recommendation is immediate oncology follow-up although he is also able to see the patient in clinic as well.  I have then spoken with Dr. Janese Banks, the oncologist on-call, who will get the patient an appointment in her clinic on Friday.  In the meantime, we will make sure the patient has pain control options at home, he understands the importance of follow-up at this time.  ____________________________________________  FINAL CLINICAL IMPRESSION(S) / ED DIAGNOSES  Final diagnoses:  Ascites due to alcoholic cirrhosis ([place])  Hepatocellular carcinoma (HCC)  Acute bilateral low back pain without sciatica         NEW MEDICATIONS STARTED DURING THIS VISIT:  New Prescriptions   ONDANSETRON (ZOFRAN ODT) 4 MG DISINTEGRATING TABLET    Take 1 tablet (4 mg total) by mouth every 8 (eight) hours as needed for nausea or vomiting.   OXYCODONE (ROXICODONE) 5 MG IMMEDIATE RELEASE TABLET    Take 1 tablet (5 mg total) by mouth every 4 (four) hours as needed for severe pain.       Eula Listen, MD 11/10/17 1253

## 2017-11-10 NOTE — ED Notes (Signed)
Unsuccessful attempt at [facility] catheter insertion

## 2017-11-10 NOTE — ED Triage Notes (Signed)
Pt states he was discharged from inpatient about 10 days ago with a known blood clot in his intestines and states the pain is worse and in more the right side since discharge with N/V.Marland Kitchen

## 2017-11-10 NOTE — ED Notes (Signed)
Pt is calling his son to provide transportation home.

## 2017-11-10 NOTE — Discharge Instructions (Addendum)
Please return to the emergency department if you develop severe pain, lightheadedness or fainting, fever, inability to keep down fluids, or any other symptoms concerning to you.

## 2017-11-13 ENCOUNTER — Telehealth: Payer: Self-pay

## 2017-11-13 ENCOUNTER — Other Ambulatory Visit: Payer: Self-pay

## 2017-11-13 ENCOUNTER — Inpatient Hospital Stay: Payer: Medicare Other

## 2017-11-13 ENCOUNTER — Encounter: Payer: Self-pay | Admitting: Oncology

## 2017-11-13 ENCOUNTER — Telehealth: Payer: Self-pay | Admitting: Pharmacist

## 2017-11-13 ENCOUNTER — Other Ambulatory Visit: Payer: Self-pay | Admitting: *Deleted

## 2017-11-13 ENCOUNTER — Inpatient Hospital Stay: Payer: Medicare Other | Attending: Oncology | Admitting: Oncology

## 2017-11-13 ENCOUNTER — Telehealth: Payer: Self-pay | Admitting: Pharmacy Technician

## 2017-11-13 VITALS — BP 125/82 | HR 116 | Temp 97.8°F | Resp 18 | Ht 72.0 in | Wt 161.5 lb

## 2017-11-13 DIAGNOSIS — Z66 Do not resuscitate: Secondary | ICD-10-CM | POA: Insufficient documentation

## 2017-11-13 DIAGNOSIS — R5381 Other malaise: Secondary | ICD-10-CM | POA: Insufficient documentation

## 2017-11-13 DIAGNOSIS — I252 Old myocardial infarction: Secondary | ICD-10-CM | POA: Insufficient documentation

## 2017-11-13 DIAGNOSIS — R531 Weakness: Secondary | ICD-10-CM | POA: Insufficient documentation

## 2017-11-13 DIAGNOSIS — Z515 Encounter for palliative care: Secondary | ICD-10-CM | POA: Diagnosis not present

## 2017-11-13 DIAGNOSIS — B1910 Unspecified viral hepatitis B without hepatic coma: Secondary | ICD-10-CM | POA: Diagnosis not present

## 2017-11-13 DIAGNOSIS — R180 Malignant ascites: Secondary | ICD-10-CM | POA: Diagnosis not present

## 2017-11-13 DIAGNOSIS — I2510 Atherosclerotic heart disease of native coronary artery without angina pectoris: Secondary | ICD-10-CM | POA: Diagnosis not present

## 2017-11-13 DIAGNOSIS — I509 Heart failure, unspecified: Secondary | ICD-10-CM | POA: Insufficient documentation

## 2017-11-13 DIAGNOSIS — Z7902 Long term (current) use of antithrombotics/antiplatelets: Secondary | ICD-10-CM | POA: Diagnosis not present

## 2017-11-13 DIAGNOSIS — J449 Chronic obstructive pulmonary disease, unspecified: Secondary | ICD-10-CM | POA: Insufficient documentation

## 2017-11-13 DIAGNOSIS — C220 Liver cell carcinoma: Secondary | ICD-10-CM | POA: Diagnosis not present

## 2017-11-13 DIAGNOSIS — F17210 Nicotine dependence, cigarettes, uncomplicated: Secondary | ICD-10-CM | POA: Diagnosis not present

## 2017-11-13 DIAGNOSIS — K7460 Unspecified cirrhosis of liver: Secondary | ICD-10-CM

## 2017-11-13 DIAGNOSIS — M1990 Unspecified osteoarthritis, unspecified site: Secondary | ICD-10-CM | POA: Insufficient documentation

## 2017-11-13 DIAGNOSIS — R590 Localized enlarged lymph nodes: Secondary | ICD-10-CM | POA: Diagnosis not present

## 2017-11-13 DIAGNOSIS — K219 Gastro-esophageal reflux disease without esophagitis: Secondary | ICD-10-CM | POA: Insufficient documentation

## 2017-11-13 DIAGNOSIS — R188 Other ascites: Secondary | ICD-10-CM

## 2017-11-13 DIAGNOSIS — F17200 Nicotine dependence, unspecified, uncomplicated: Secondary | ICD-10-CM

## 2017-11-13 DIAGNOSIS — Z7901 Long term (current) use of anticoagulants: Secondary | ICD-10-CM | POA: Insufficient documentation

## 2017-11-13 DIAGNOSIS — R5383 Other fatigue: Secondary | ICD-10-CM | POA: Diagnosis not present

## 2017-11-13 DIAGNOSIS — Z7189 Other specified counseling: Secondary | ICD-10-CM

## 2017-11-13 DIAGNOSIS — B182 Chronic viral hepatitis C: Secondary | ICD-10-CM | POA: Diagnosis not present

## 2017-11-13 DIAGNOSIS — Z79899 Other long term (current) drug therapy: Secondary | ICD-10-CM | POA: Insufficient documentation

## 2017-11-13 DIAGNOSIS — Z8719 Personal history of other diseases of the digestive system: Secondary | ICD-10-CM

## 2017-11-13 DIAGNOSIS — G893 Neoplasm related pain (acute) (chronic): Secondary | ICD-10-CM | POA: Diagnosis not present

## 2017-11-13 LAB — COMPREHENSIVE METABOLIC PANEL
ALT: 37 U/L (ref 0–44)
AST: 178 U/L — AB (ref 15–41)
Albumin: 3.1 g/dL — ABNORMAL LOW (ref 3.5–5.0)
Alkaline Phosphatase: 167 U/L — ABNORMAL HIGH (ref 38–126)
Anion gap: 11 (ref 5–15)
BUN: 12 mg/dL (ref 6–20)
CHLORIDE: 96 mmol/L — AB (ref 98–111)
CO2: 25 mmol/L (ref 22–32)
CREATININE: 1 mg/dL (ref 0.61–1.24)
Calcium: 9.2 mg/dL (ref 8.9–10.3)
Glucose, Bld: 134 mg/dL — ABNORMAL HIGH (ref 70–99)
POTASSIUM: 3.9 mmol/L (ref 3.5–5.1)
Sodium: 132 mmol/L — ABNORMAL LOW (ref 135–145)
Total Bilirubin: 2.4 mg/dL — ABNORMAL HIGH (ref 0.3–1.2)
Total Protein: 7.9 g/dL (ref 6.5–8.1)

## 2017-11-13 LAB — CBC WITH DIFFERENTIAL/PLATELET
ABS IMMATURE GRANULOCYTES: 0.01 10*3/uL (ref 0.00–0.07)
BASOS ABS: 0.1 10*3/uL (ref 0.0–0.1)
Basophils Relative: 1 %
EOS PCT: 7 %
Eosinophils Absolute: 0.4 10*3/uL (ref 0.0–0.5)
HCT: 33.7 % — ABNORMAL LOW (ref 39.0–52.0)
HEMOGLOBIN: 11.1 g/dL — AB (ref 13.0–17.0)
Immature Granulocytes: 0 %
LYMPHS ABS: 0.9 10*3/uL (ref 0.7–4.0)
LYMPHS PCT: 17 %
MCH: 26.6 pg (ref 26.0–34.0)
MCHC: 32.9 g/dL (ref 30.0–36.0)
MCV: 80.8 fL (ref 80.0–100.0)
Monocytes Absolute: 0.4 10*3/uL (ref 0.1–1.0)
Monocytes Relative: 8 %
NEUTROS ABS: 3.4 10*3/uL (ref 1.7–7.7)
NRBC: 0 % (ref 0.0–0.2)
Neutrophils Relative %: 67 %
Platelets: 184 10*3/uL (ref 150–400)
RBC: 4.17 MIL/uL — AB (ref 4.22–5.81)
RDW: 18.1 % — ABNORMAL HIGH (ref 11.5–15.5)
WBC: 5.2 10*3/uL (ref 4.0–10.5)

## 2017-11-13 LAB — PROTIME-INR
INR: 1.85
Prothrombin Time: 21.1 seconds — ABNORMAL HIGH (ref 11.4–15.2)

## 2017-11-13 LAB — TSH: TSH: 10.741 u[IU]/mL — ABNORMAL HIGH (ref 0.350–4.500)

## 2017-11-13 MED ORDER — OXYCODONE HCL 5 MG PO TABS: 5.0000 mg | ORAL_TABLET | Freq: Four times a day (QID) | ORAL | 0 refills | Status: DC | PRN

## 2017-11-13 NOTE — Telephone Encounter (Signed)
Oral Oncology Patient Advocate Encounter  Prior Authorization for Phillip Warren has been approved.    PA# [id number]  Effective dates: 08/15/17 through 11/12/2020  Patients co-pay is $3.80  Oral Oncology Clinic will continue to follow.   [name] Patient [place] Phone [phone] Fax [phone] 11/13/2017 2:42 PM

## 2017-11-13 NOTE — Telephone Encounter (Signed)
Oral Oncology Pharmacist Encounter  Received new prescription for Lenvima (lenvatinib) for the treatment of [place] in, planned duration until disease progression or unacceptable drug toxicity.  CMP from 11/13/17 assessed, no relevant lab abnormalities. TSH pending at time of note. UA from 11/10/17 was negative for protein. BP from 11/13/17 well controlled.   Current medication list in Epic reviewed, one DDIs with lenvatinib identified: - Lenvatinib  may enhance the QTc-prolonging effect of ondansetron. Ondansetron is prescribed prn for Mr. Hoogendoorn. If he starts to need the ondansetron more regularly, recommend monitoring his QTc occasionally.    Patient education Counseled patient following his OV on administration, dosing, side effects, monitoring, drug-food interactions, safe handling, storage, and disposal.  Side effects include but not limited to: HTN, N/V, diarrhea, fatigue, hand-foot syndrome.   Reviewed with patient importance of keeping a medication schedule and plan for any missed doses.  Mr. Heist voiced understanding and appreciation. All questions answered. Medication handout provided and consent obtained.  Oral Oncology Clinic will continue to follow for insurance authorization, copayment issues, and start date.  Provided patient with [facility] [place] [facility] phone number. Patient knows to call the office with questions or concerns. Oral Chemotherapy Navigation Clinic will continue to follow.  Darl Pikes, PharmD, BCPS, [facility] Hematology/Oncology Clinical Pharmacist [facility]/HP/AP [facility] [place] [facility] [phone]  11/13/2017 12:12 PM

## 2017-11-13 NOTE — Telephone Encounter (Signed)
Oral Oncology Patient Advocate Encounter  Received notification from [place] that prior authorization for Phillip Warren is required.  PA submitted on CoverMyMeds Key [id number]  Status is pending  Oral Oncology Clinic will continue to follow.  [place] Patient [place] Phone [phone] Fax [phone] 11/13/2017 2:41 PM

## 2017-11-13 NOTE — Telephone Encounter (Signed)
Called and confirmed appointment with Mr. Rhew for U/S guided paracentesis on 10/21 with arrival time at the medical mall entrance at 1030. Read back performed. Oncology Nurse Navigator Documentation  Navigator Location: [facility]-Med Onc (11/13/17 1500)   )Navigator Encounter Type: Telephone (11/13/17 1500) Telephone: Lahoma Crocker Call;Appt Confirmation/Clarification (11/13/17 1500)                                                  Time Spent with Patient: 15 (11/13/17 1500)

## 2017-11-13 NOTE — Progress Notes (Signed)
Previosuly met Mr. Spurgeon in 2016 when he was diagnosed with [facility] and underwent liver ablation. Reintroduced nurse navigator services and provided him and his daughter, Alyse Low, my contact information for future needs. Provided contact information for [place] meals on wheels. Appointment arranged with [facility] to reestablish with Dr. Alice Reichert. He will see Cherylynn Ridges PA, 11/22 at 1530. Went over this appointment and provided in writing. He is aware of location of appointment. Checklist for paracentesis faxed to special scheduling. Oncology Nurse Navigator Documentation  Navigator Location: [facility] Onc (11/13/17 1100)   )Navigator Encounter Type: Initial MedOnc (11/13/17 1100)                                          Acuity: Level 2 (11/13/17 1100)   Acuity Level 2: Initial guidance, education and coordination as needed;Educational needs;Assistance expediting appointments;Ongoing guidance and education throughout treatment as needed (11/13/17 1100)     Time Spent with Patient: 30 (11/13/17 1100)

## 2017-11-14 ENCOUNTER — Other Ambulatory Visit: Payer: Self-pay | Admitting: Oncology

## 2017-11-14 LAB — HCV RNA QUANT
HCV QUANT: 15800 [IU]/mL (ref >50)
HCV Quantitative Log: 4.199 log10 IU/mL (ref >1.70)

## 2017-11-14 LAB — HEPATITIS B DNA, ULTRAQUANTITATIVE, PCR
HBV DNA SERPL PCR-ACNC: 230 [IU]/mL
HBV DNA SERPL PCR-LOG IU: 2.362 {Log_IU}/mL

## 2017-11-14 LAB — AFP TUMOR MARKER: AFP, Serum, Tumor Marker: 42257 ng/mL — ABNORMAL HIGH (ref 0.0–8.3)

## 2017-11-15 ENCOUNTER — Other Ambulatory Visit: Payer: Self-pay | Admitting: *Deleted

## 2017-11-15 MED ORDER — ALBUMIN HUMAN 25 % IV SOLN: 25.0000 g | Freq: Once | INTRAVENOUS | Status: DC

## 2017-11-16 ENCOUNTER — Ambulatory Visit
Admission: RE | Admit: 2017-11-16 | Discharge: 2017-11-16 | Disposition: A | Discharge status: Home / Self Care | Payer: Medicare Other | Source: Ambulatory Visit | Attending: Oncology | Admitting: Oncology

## 2017-11-16 ENCOUNTER — Encounter: Payer: Self-pay | Admitting: Oncology

## 2017-11-16 VITALS — BP 119/66 | HR 98

## 2017-11-16 DIAGNOSIS — R188 Other ascites: Secondary | ICD-10-CM | POA: Diagnosis not present

## 2017-11-16 DIAGNOSIS — K7460 Unspecified cirrhosis of liver: Secondary | ICD-10-CM | POA: Insufficient documentation

## 2017-11-16 DIAGNOSIS — C220 Liver cell carcinoma: Secondary | ICD-10-CM | POA: Insufficient documentation

## 2017-11-16 LAB — BODY FLUID CELL COUNT WITH DIFFERENTIAL
EOS FL: 2 %
Lymphs, Fluid: 80 %
Monocyte-Macrophage-Serous Fluid: 0 %
Neutrophil Count, Fluid: 17 %
OTHER CELLS FL: 1 %
WBC FLUID: 119 uL

## 2017-11-16 LAB — AMYLASE, PLEURAL OR PERITONEAL FLUID: AMYLASE FL: 9 U/L

## 2017-11-16 LAB — LACTATE DEHYDROGENASE, PLEURAL OR PERITONEAL FLUID: LD, Fluid: 32 U/L — ABNORMAL HIGH (ref 3–23)

## 2017-11-16 LAB — PROTEIN, PLEURAL OR PERITONEAL FLUID

## 2017-11-16 LAB — ALBUMIN, PLEURAL OR PERITONEAL FLUID: Albumin, Fluid: <1 g/dL

## 2017-11-16 MED ORDER — ALBUMIN HUMAN 25 % IV SOLN
25.0000 g | Freq: Once | INTRAVENOUS | Status: AC
  Administered 2017-11-16: 25 g via INTRAVENOUS

## 2017-11-16 MED ORDER — ALBUMIN HUMAN 25 % IV SOLN
INTRAVENOUS | Status: AC
  Administered 2017-11-16: 25 g via INTRAVENOUS
  Filled 2017-11-16: qty 100

## 2017-11-16 NOTE — Procedures (Signed)
Pre Procedural Dx: History of infiltrative HCC, now with symptomatic Ascites Post Procedural Dx: Same  Successful US guided paracentesis yielding 6.9 L of serous ascitic fluid. Sample sent to laboratory as requested.  EBL: None  Complications: None immediate  Ronny Bacon, MD Pager #: [phone]

## 2017-11-16 NOTE — Progress Notes (Addendum)
Hematology/Oncology Consult note [facility] [phone] [phone]  Patient Care Team: Glendon Axe, MD as PCP - General (Internal Medicine) Wellington Hampshire, MD as PCP - Cardiology (Cardiology) Clent Jacks, RN as Registered Nurse   Name of the patient: Phillip Warren  [id number]  [birth year]    Reason for referral- new diagnosis of [facility]   Referring physician- ER Dr. Mariea Clonts  Date of visit: 11/16/17   History of presenting illness-patient is a 62 year old male with who recently presented to the ER with symptoms of abdominal pain.  He has a known history of cirrhosis and hepatocellular carcinoma since 2016.  At that time he underwent IR guided ablation but since then has been lost to follow-up.  He also has history of active hepatitis B and hepatitis C but has not followed up with GI recently.  He was admitted to the hospital in September 2019 and at that point there was a concern for portal vein thrombosis for which she was started on Coumadin.  He also had MI possibly secondary to heroine overdose and his EF at that time was 25 to 30%.  During his recent ER admission on 10/22/2017 ultrasound of the abdomen showed main portal vein is distended and appears thrombosed.  Focal hepatic lesion in the right hepatic lobe measuring 5.2 x 3.3 x 5 cm.  MRI of the abdomen showed large poorly marginated mass replacing the right liver lobe measuring up to 15.9 x 11.3 cm.  Expansile occlusive thrombosis of the main right and left portal veins contiguous with this mass most compatible with tumor thrombus.  Moderate splenomegaly 18 cm.  Moderate volume ascites.  Nonspecific portocaval and retroperitoneal adenopathy cannot exclude metastatic disease.  Patient referred to [place] for further management.  Currently patient reports feeling poorly.  His abdominal pain is getting worse and his belly feels more distended.  His appetite is poor.  He is here with his  daughter today  ECOG PS- 2  Pain scale- 8   Review of systems- Review of Systems  Constitutional: Positive for malaise/fatigue and weight loss. Negative for chills and fever.  HENT: Negative for congestion, ear discharge and nosebleeds.   Eyes: Negative for blurred vision.  Respiratory: Negative for cough, hemoptysis, sputum production, shortness of breath and wheezing.   Cardiovascular: Negative for chest pain, palpitations, orthopnea and claudication.  Gastrointestinal: Positive for abdominal pain. Negative for blood in stool, constipation, diarrhea, heartburn, melena, nausea and vomiting.       Abdominal distention  Genitourinary: Negative for dysuria, flank pain, frequency, hematuria and urgency.  Musculoskeletal: Negative for back pain, joint pain and myalgias.  Skin: Negative for rash.  Neurological: Negative for dizziness, tingling, focal weakness, seizures, weakness and headaches.  Endo/Heme/Allergies: Does not bruise/bleed easily.  Psychiatric/Behavioral: Negative for depression and suicidal ideas. The patient does not have insomnia.     Allergies  Allergen Reactions  . Multihance [Gadobenate] Hives and Shortness Of Breath  . Hydrocodone Itching and Nausea And Vomiting  . Other Itching    multihance MRI dye  . Vicodin [Hydrocodone-Acetaminophen] Nausea Only and Nausea And Vomiting    Vomiting     Patient Active Problem List   Diagnosis Date Noted  . Abdominal pain 10/22/2017  . Cholelithiasis   . COPD (chronic obstructive pulmonary disease) ([place]) 03/09/2017  . GERD (gastroesophageal reflux disease) 03/09/2017  . Alcohol abuse 03/09/2017  . History of GI bleed 03/09/2017  . Acute ST elevation myocardial infarction (STEMI) of inferior wall (HCC)  03/09/2017  . Acute ST elevation myocardial infarction (STEMI) involving right coronary artery ([place])   . GI bleed 01/02/2017  . Hepatic cirrhosis ([place])   . Hepatocellular carcinoma ([place])   . Comern­o (hepatocellular carcinoma)  ([place])   . Hepatitis, viral   . Cancer, hepatocellular ([place]) 08/31/2014  . Absolute anemia 08/26/2014  . Cirrhosis ([place]) 08/26/2014  . Arteriosclerosis of coronary artery 08/26/2014  . HBV (hepatitis B virus) infection 08/26/2014  . HCV (hepatitis C virus) 08/26/2014  . Chronic hepatitis C ([place]) 04/13/2014  . Foot pain 04/13/2014     Past Medical History:  Diagnosis Date  . Alcohol abuse   . Anorexia    has trouble eating due to poor appetite  . Anxiety    trouble sleeping; lost two sisters and his mother close together  . Arthritis    hands, feet (burning, stinging)  . Cancer [facility])    liver lesion initial staging.  . CHF (congestive heart failure) ([place])   . Cirrhosis of liver ([place])   . Cocaine abuse ([place])   . COPD (chronic obstructive pulmonary disease) ([place])   . Coronary artery disease    Inferior ST elevation myocardial infarction in February 2019.  Cardiac catheterization showed subtotal occlusion of mid right coronary artery with severe disease affecting the LAD and left circumflex.  Successful PCI and 2 drug-eluting stent placement to the RCA.  He returned a few days after hospital discharge with stent thrombosis due to not taking any of his cardiac medications.   Marland Kitchen GERD (gastroesophageal reflux disease)   . H/O dizziness   . Headache   . Hepatitis    B and C (chronic)  . Marijuana use   . Myocardial infarction ([place])    massive MI at age 39  . Neuropathy   . Numbness and tingling    legs and feet bilat   . Pneumonia   . Repeated falls   . Shortness of breath dyspnea   . Tinnitus   . Tobacco abuse      Past Surgical History:  Procedure Laterality Date  . CARDIAC SURGERY    . COLONOSCOPY WITH PROPOFOL N/A 08/31/2014   Procedure: COLONOSCOPY WITH PROPOFOL;  Surgeon: Josefine Class, MD;  Location[facility] ENDOSCOPY;  Service: Endoscopy;  Laterality: N/A;  . CORONARY ANGIOPLASTY     at age 79 secondary to massive MI  . CORONARY/GRAFT ACUTE MI REVASCULARIZATION  N/A 03/09/2017   Procedure: Coronary/Graft Acute MI Revascularization;  Surgeon: Wellington Hampshire, MD;  Location: [place] CV LAB;  Service: Cardiovascular;  Laterality: N/A;  . CORONARY/GRAFT ACUTE MI REVASCULARIZATION N/A 03/16/2017   Procedure: Coronary/Graft Acute MI Revascularization;  Surgeon: Wellington Hampshire, MD;  Location: [place] CV LAB;  Service: Cardiovascular;  Laterality: N/A;  balloon only RCA  . ESOPHAGOGASTRODUODENOSCOPY (EGD) WITH PROPOFOL N/A 08/31/2014   Procedure: ESOPHAGOGASTRODUODENOSCOPY (EGD) WITH PROPOFOL;  Surgeon: Josefine Class, MD;  Location: [facility] ENDOSCOPY;  Service: Endoscopy;  Laterality: N/A;  . ESOPHAGOGASTRODUODENOSCOPY (EGD) WITH PROPOFOL N/A 01/02/2017   Procedure: ESOPHAGOGASTRODUODENOSCOPY (EGD) WITH PROPOFOL;  Surgeon: [place], Benay Pike, MD;  Location: [facility] ENDOSCOPY;  Service: Gastroenterology;  Laterality: N/A;  . LEFT HEART CATH AND CORONARY ANGIOGRAPHY N/A 03/09/2017   Procedure: LEFT HEART CATH AND CORONARY ANGIOGRAPHY;  Surgeon: Wellington Hampshire, MD;  Location: [place] CV LAB;  Service: Cardiovascular;  Laterality: N/A;  . LEFT HEART CATH AND CORONARY ANGIOGRAPHY N/A 03/16/2017   Procedure: LEFT HEART CATH AND CORONARY ANGIOGRAPHY;  Surgeon: Wellington Hampshire,  MD;  Location: [place] CV LAB;  Service: Cardiovascular;  Laterality: N/A;    Social History   Socioeconomic History  . Marital status: Widowed    Spouse name: Not on file  . Number of children: Not on file  . Years of education: Not on file  . Highest education level: Not on file  Occupational History  . Not on file  Social Needs  . Financial resource strain: Not on file  . Food insecurity:    Worry: Not on file    Inability: Not on file  . Transportation needs:    Medical: Not on file    Non-medical: Not on file  Tobacco Use  . Smoking status: Current Every Day Smoker    Packs/day: 0.50    Years: 30.00    Pack years: 15.00    Types: Cigarettes  .  Smokeless tobacco: Never Used  Substance and Sexual Activity  . Alcohol use: Yes    Frequency: Never    Comment: "very little"  . Drug use: Yes    Types: Marijuana    Comment: pt states has not used cocaine in 20 years   . Sexual activity: Not on file  Lifestyle  . Physical activity:    Days per week: Not on file    Minutes per session: Not on file  . Stress: Not on file  Relationships  . Social connections:    Talks on phone: Not on file    Gets together: Not on file    Attends religious service: Not on file    Active member of club or organization: Not on file    Attends meetings of clubs or organizations: Not on file    Relationship status: Not on file  . Intimate partner violence:    Fear of current or ex partner: Not on file    Emotionally abused: Not on file    Physically abused: Not on file    Forced sexual activity: Not on file  Other Topics Concern  . Not on file  Social History Narrative  . Not on file     Family History  Problem Relation Age of Onset  . CAD Father   . Lung cancer Sister   . CAD Brother   . Heart disease Brother        CABG x 3  . Heart attack Brother        nine heart attacks  . Prostate cancer Neg Hx   . Bladder Cancer Neg Hx   . Kidney cancer Neg Hx      Current Outpatient Medications:  .  atorvastatin (LIPITOR) 10 MG tablet, Take 1 tablet (10 mg total) by mouth daily at 6 PM., Disp: 30 tablet, Rfl: 3 .  clopidogrel (PLAVIX) 75 MG tablet, Take 1 tablet (75 mg total) by mouth daily with breakfast., Disp: 30 tablet, Rfl: 5 .  gabapentin (NEURONTIN) 800 MG tablet, Take 800 mg by mouth 4 (four) times daily., Disp: , Rfl:  .  lactulose (CHRONULAC) 10 GM/15ML solution, Take 45 mLs (30 g total) by mouth 2 (two) times daily., Disp: 240 mL, Rfl: 0 .  lisinopril (PRINIVIL,ZESTRIL) 2.5 MG tablet, Take 1 tablet (2.5 mg total) by mouth daily., Disp: 30 tablet, Rfl: 5 .  metoprolol succinate (TOPROL-XL) 25 MG 24 hr tablet, Take 0.5 tablets (12.5  mg total) by mouth daily., Disp: 30 tablet, Rfl: 6 .  ondansetron (ZOFRAN ODT) 4 MG disintegrating tablet, Take 1 tablet (4 mg total) by mouth  every 8 (eight) hours as needed for nausea or vomiting., Disp: 20 tablet, Rfl: 0 .  pantoprazole (PROTONIX) 40 MG tablet, Take 1 tablet (40 mg total) by mouth daily., Disp: 30 tablet, Rfl: 11 .  warfarin (COUMADIN) 2.5 MG tablet, Take 1 tablet (2.5 mg total) by mouth daily., Disp: 30 tablet, Rfl: 0 .  nicotine (NICODERM CQ - DOSED IN MG/24 HOURS) 21 mg/24hr patch, Place 1 patch (21 mg total) onto the skin daily. (Patient not taking: Reported on 11/13/2017), Disp: 28 patch, Rfl: 0 .  nitroGLYCERIN (NITROSTAT) 0.4 MG SL tablet, Place 0.4 mg under the tongue every 5 (five) minutes as needed for chest pain. , Disp: , Rfl:  .  ondansetron (ZOFRAN) 4 MG tablet, Take 1 tablet (4 mg total) by mouth every 4 (four) hours as needed for nausea. (Patient not taking: Reported on 11/13/2017), Disp: 20 tablet, Rfl: 0 .  oxyCODONE (ROXICODONE) 5 MG immediate release tablet, Take 1 tablet (5 mg total) by mouth every 6 (six) hours as needed for severe pain., Disp: 56 tablet, Rfl: 0 No current facility-administered medications for this visit.   Facility-Administered Medications Ordered in Other Visits:  .  albumin human 25 % solution 25 g, 25 g, Intravenous, Once, Sindy Guadeloupe, MD   Physical exam:  Vitals:   11/13/17 0930  BP: 125/82  Pulse: (!) 116  Resp: 18  Temp: 97.8 F (36.6 C)  TempSrc: Tympanic  SpO2: 97%  Weight: 161 lb 8 oz (73.3 kg)  Height: 6' (1.829 m)   Physical Exam  Constitutional: He is oriented to person, place, and time.  Thin cachectic frail man who appears in distress because of abdominal pain  HENT:  Head: Normocephalic and atraumatic.  Eyes: Pupils are equal, round, and reactive to light. EOM are normal.  Neck: Normal range of motion.  Cardiovascular: Normal rate, regular rhythm and normal heart sounds.  Pulmonary/Chest: Effort normal  and breath sounds normal.  Abdominal:  Grossly distended and ascites positive.  If difficult to appreciate hepatosplenomegaly because of underlying ascites  Musculoskeletal: He exhibits no edema.  Neurological: He is alert and oriented to person, place, and time.  Skin: Skin is warm and dry.       CMP Latest Ref Rng & Units 11/13/2017  Glucose 70 - 99 mg/dL 134(H)  BUN 6 - 20 mg/dL 12  Creatinine 0.61 - 1.24 mg/dL 1.00  Sodium 135 - 145 mmol/L 132(L)  Potassium 3.5 - 5.1 mmol/L 3.9  Chloride 98 - 111 mmol/L 96(L)  CO2 22 - 32 mmol/L 25  Calcium 8.9 - 10.3 mg/dL 9.2  Total Protein 6.5 - 8.1 g/dL 7.9  Total Bilirubin 0.3 - 1.2 mg/dL 2.4(H)  Alkaline Phos 38 - 126 U/L 167(H)  AST 15 - 41 U/L 178(H)  ALT 0 - 44 U/L 37   CBC Latest Ref Rng & Units 11/13/2017  WBC 4.0 - 10.5 K/uL 5.2  Hemoglobin 13.0 - 17.0 g/dL 11.1(L)  Hematocrit 39.0 - 52.0 % 33.7(L)  Platelets 150 - 400 K/uL 184    No images are attached to the encounter.  Dg Chest 2 View  Result Date: 10/22/2017 CLINICAL DATA:  Abdominal pain and swelling 2 weeks with 20 pound weight gain. Pleuritic chest pain. EXAM: CHEST - 2 VIEW COMPARISON:  03/16/2017 and 01/02/2017 FINDINGS: Lungs are adequately inflated without consolidation or effusion. Cardiomediastinal silhouette and remainder of the exam is unchanged. IMPRESSION: No active cardiopulmonary disease. Electronically Signed   By: Marin Olp M.D.  On: 10/22/2017 17:20   Mr Abdomen Mrcp Wo Contrast  Result Date: 10/23/2017 CLINICAL DATA:  Right upper quadrant abdominal pain. Elevated liver function tests. History of cirrhosis and hepatocellular carcinoma status post percutaneous thermal ablation 11/10/2014. Cholelithiasis and gallbladder wall thickening and portal vein thrombosis and posterior right liver mass on sonography performed earlier today. EXAM: MRI ABDOMEN WITHOUT CONTRAST  (INCLUDING MRCP) TECHNIQUE: Multiplanar multisequence MR imaging of the abdomen was  performed. Heavily T2-weighted images of the biliary and pancreatic ducts were obtained, and three-dimensional MRCP images were rendered by post processing. COMPARISON:  10/22/2017 abdominal sonogram.  08/11/2014 MRI abdomen. FINDINGS: Lower chest: No acute abnormality at the lung bases. Hepatobiliary: Liver surface is diffusely irregular, compatible with hepatic cirrhosis. Mild diffuse hepatic steatosis. There is a large poorly marginated mass essentially replacing the right liver lobe, measuring up to 15.9 x 11.3 cm (series 3/image 13). There is expansile occlusive thrombosis of the main, right and left portal veins contiguous with this mass, most compatible with tumor thrombus. Gallbladder is filled with stones and sludge with marked diffuse gallbladder wall thickening. No biliary ductal dilatation. Common bile duct diameter 5 mm. No choledocholithiasis. Pancreas: No pancreatic mass or duct dilation.  No pancreas divisum. Spleen: Moderate splenomegaly (craniocaudal splenic length 18.1 cm). T2 hyperintense 1.5 cm superior splenic mass is not significantly changed since 2016 MRI, most compatible with a benign mass. No new splenic mass. Adrenals/Urinary Tract: Normal adrenals. No hydronephrosis. Normal kidneys with no renal mass. Stomach/Bowel: Normal non-distended stomach. Normal caliber visualized small and large bowel. Suggestion of mild wall thickening throughout the small bowel. Vascular/Lymphatic: Infrarenal 3.2 cm abdominal aortic aneurysm, increased from 2.6 cm on 08/11/2014 MRI. Mildly enlarged 1.7 cm portacaval node (series 16/image 55). Mildly enlarged right pericaval, aortocaval and left para-aortic nodes up to 1.1 cm in the left periaortic chain (series 16/image 70). Other: Moderate volume ascites.  No focal fluid collection. Musculoskeletal: No aggressive appearing focal osseous lesions. IMPRESSION: 1. Hepatic cirrhosis. Large poorly marginated liver mass essentially replacing the right liver lobe,  contiguous with expansile occlusive thrombus within the main, right and left portal veins. Although this study was performed without IV contrast, the findings are compatible with advanced recurrent hepatocellular carcinoma. Repeat MRI abdomen without and with IV contrast is recommended for improved characterization of this process. 2. Moderate splenomegaly.  Moderate volume ascites. 3. Cholelithiasis. Marked diffuse gallbladder wall thickening, nonspecific but more likely noninflammatory. 4. Mild diffuse small bowel wall thickening, nonspecific probably due to noninflammatory edema. 5. Nonspecific portacaval and retroperitoneal adenopathy, cannot exclude metastatic disease. 6. Infrarenal 3.2 cm Abdominal Aortic Aneurysm (ICD10-I71.9). Recommend follow-up aortic ultrasound in 3 years. This recommendation follows ACR consensus guidelines: White Paper of the ACR Incidental Findings Committee II on Vascular Findings. J Am Coll Radiol 2013; 10:789-794. Electronically Signed   By: Ilona Sorrel M.D.   On: 10/23/2017 00:00   Mr 3d Recon At Scanner  Result Date: 10/23/2017 CLINICAL DATA:  Right upper quadrant abdominal pain. Elevated liver function tests. History of cirrhosis and hepatocellular carcinoma status post percutaneous thermal ablation 11/10/2014. Cholelithiasis and gallbladder wall thickening and portal vein thrombosis and posterior right liver mass on sonography performed earlier today. EXAM: MRI ABDOMEN WITHOUT CONTRAST  (INCLUDING MRCP) TECHNIQUE: Multiplanar multisequence MR imaging of the abdomen was performed. Heavily T2-weighted images of the biliary and pancreatic ducts were obtained, and three-dimensional MRCP images were rendered by post processing. COMPARISON:  10/22/2017 abdominal sonogram.  08/11/2014 MRI abdomen. FINDINGS: Lower chest: No acute abnormality at the  lung bases. Hepatobiliary: Liver surface is diffusely irregular, compatible with hepatic cirrhosis. Mild diffuse hepatic steatosis.  There is a large poorly marginated mass essentially replacing the right liver lobe, measuring up to 15.9 x 11.3 cm (series 3/image 13). There is expansile occlusive thrombosis of the main, right and left portal veins contiguous with this mass, most compatible with tumor thrombus. Gallbladder is filled with stones and sludge with marked diffuse gallbladder wall thickening. No biliary ductal dilatation. Common bile duct diameter 5 mm. No choledocholithiasis. Pancreas: No pancreatic mass or duct dilation.  No pancreas divisum. Spleen: Moderate splenomegaly (craniocaudal splenic length 18.1 cm). T2 hyperintense 1.5 cm superior splenic mass is not significantly changed since 2016 MRI, most compatible with a benign mass. No new splenic mass. Adrenals/Urinary Tract: Normal adrenals. No hydronephrosis. Normal kidneys with no renal mass. Stomach/Bowel: Normal non-distended stomach. Normal caliber visualized small and large bowel. Suggestion of mild wall thickening throughout the small bowel. Vascular/Lymphatic: Infrarenal 3.2 cm abdominal aortic aneurysm, increased from 2.6 cm on 08/11/2014 MRI. Mildly enlarged 1.7 cm portacaval node (series 16/image 55). Mildly enlarged right pericaval, aortocaval and left para-aortic nodes up to 1.1 cm in the left periaortic chain (series 16/image 70). Other: Moderate volume ascites.  No focal fluid collection. Musculoskeletal: No aggressive appearing focal osseous lesions. IMPRESSION: 1. Hepatic cirrhosis. Large poorly marginated liver mass essentially replacing the right liver lobe, contiguous with expansile occlusive thrombus within the main, right and left portal veins. Although this study was performed without IV contrast, the findings are compatible with advanced recurrent hepatocellular carcinoma. Repeat MRI abdomen without and with IV contrast is recommended for improved characterization of this process. 2. Moderate splenomegaly.  Moderate volume ascites. 3. Cholelithiasis. Marked  diffuse gallbladder wall thickening, nonspecific but more likely noninflammatory. 4. Mild diffuse small bowel wall thickening, nonspecific probably due to noninflammatory edema. 5. Nonspecific portacaval and retroperitoneal adenopathy, cannot exclude metastatic disease. 6. Infrarenal 3.2 cm Abdominal Aortic Aneurysm (ICD10-I71.9). Recommend follow-up aortic ultrasound in 3 years. This recommendation follows ACR consensus guidelines: White Paper of the ACR Incidental Findings Committee II on Vascular Findings. J Am Coll Radiol 2013; 10:789-794. Electronically Signed   By: Ilona Sorrel M.D.   On: 10/23/2017 00:00   US Paracentesis  Result Date: 11/16/2017 INDICATION: History infiltrative hepatocellular carcinoma and malignant portal vein thrombosis, now with symptomatic intra-abdominal ascites. Please perform ultrasound-guided paracentesis for both therapeutic and diagnostic purposes. EXAM: ULTRASOUND-GUIDED PARACENTESIS COMPARISON:  MRCP-10/22/2017 MEDICATIONS: None. COMPLICATIONS: None immediate. TECHNIQUE: Informed written consent was obtained from the patient after a discussion of the risks, benefits and alternatives to treatment. A timeout was performed prior to the initiation of the procedure. Initial ultrasound scanning demonstrates a moderate to large amount of ascites within the right lower abdominal quadrant. The right lower abdomen was prepped and draped in the usual sterile fashion. 1% lidocaine with epinephrine was used for local anesthesia. An ultrasound image was saved for documentation purposed. An 8 Fr Safe-T-Centesis catheter was introduced. The paracentesis was performed. The catheter was removed and a dressing was applied. The patient tolerated the procedure well without immediate post procedural complication. FINDINGS: A total of approximately 6.9 liters of serous fluid was removed. Samples were sent to the laboratory as requested by the clinical team. IMPRESSION: Successful ultrasound-guided  paracentesis yielding 6.9 liters of peritoneal fluid. Electronically Signed   By: Sandi Mariscal M.D.   On: 11/16/2017 11:55   US Abdomen Limited Ruq  Result Date: 10/22/2017 CLINICAL DATA:  Elevated LFTs, right upper quadrant pain  EXAM: ULTRASOUND ABDOMEN LIMITED RIGHT UPPER QUADRANT COMPARISON:  CT 11/11/2014 FINDINGS: Gallbladder: Multiple stones within the gallbladder, the largest 7 mm. Sludge fills the gallbladder. Wall is thickened at 9 mm. No tenderness reported over the gallbladder during the study. Common bile duct: Diameter: Normal caliber, 4 mm Liver: The main portal vein is distended and appears thrombosed. Focal hepatic lesion posteriorly within the right hepatic lobe measures 5.2 x 3.3 x 5.0 cm. Heterogeneous echotexture throughout the liver. Small amount of adjacent ascites. IMPRESSION: Cholelithiasis and sludge within the gallbladder. Gallbladder wall is markedly thickened measuring 9 mm. Cannot exclude cholecystitis. Portal vein thrombosis. Heterogeneous echotexture throughout the liver with focal 5.2 cm hypoechoic solid lesion in the posterior right hepatic lobe. Recommend further evaluation with liver protocol MRI. Small amount of perihepatic ascites. Electronically Signed   By: Rolm Baptise M.D.   On: 10/22/2017 18:31    Assessment and plan- Patient is a 62 y.o. male with locally advanced hepatocellular carcinoma in the setting of cirrhosis from hepatitis B and hepatitis C  1.  Based on his prior labs done by GI in October 2018 patient was noted to have active hep B and hep C which is still untreated.  I would therefore like him to be seen by the clinic GI ASAP who he has seen in the past as it would be better to get him started on some treatment for his hepatitis if possible before considering treatment for his hepatocellular carcinoma.  2.  With regards to hepatocellular carcinoma he was found to have a large 15 L liver mass which is consistent with [place] and he therefore does not require  biopsy.  There is some evidence of periportal adenopathy seen which may represent metastatic disease.  I will obtain a CT chest without contrast to complete his staging work-up.  He is not a candidate for any IR guided therapies or resection or transplantation.  It is difficult to calculate his child Pugh score as he is on Coumadin.  He is likely a child Pugh B at this time  3.  I will discuss his case at tumor board next week to a certain if the thrombus is a blood clot or a tumor thrombus.  If this is a tumor thrombus and I would like to stop his Coumadin  4.  With regards to his hepatocellular carcinoma, I can consider treatment with lenvatinib but given his overall frailty and underlying chronic liver disease I would like to dose reduce it to 8 mg daily.  I will see him back in 1 week's time and discuss lenvatinib in more detail.  Treatment will be given with palliative intent  5.  Patient is in significant distress from his abdominal pain and ascites.  I will therefore arrange for a paracentesis and check fluid for Gram stain culture to rule out any spontaneous bacterial peritonitis as well as check cytology for metastatic disease.  I will also prescribe oxycodone 5 mg every 6 hours as needed for his pain.  I emphasized to the patient that he needs to take his pain medication strictly as prescribed and not more.  We would not be able to renew his pain medications ahead of time.  We will need to get a narcotic pain contract as well as urine studies done at our next visit.  I also explained to the patient that if I find any evidence of heroin or cocaine in his urine I will not be able to prescribe any further pain medicine.  Patient  verbalized understanding  6.  His overall prognosis from his underlying hepatocellular carcinoma as well as chronic liver disease with hep B and hep C is poor and I will refer him to NP Vonna Kotyk borders to discuss goals of care as well as to get his pain contract in place.  I  will check CBC CMP AFP hepatitis B and hepatitis C viral titers today as well as baseline TSH   Total face to face encounter time for this patient visit was 40 min. >50% of the time was  spent in counseling and coordination of care.     Visit Diagnosis 1. Smoker   2. Cancer, hepatocellular ([place])   3. Cirrhosis of liver with ascites, unspecified hepatic cirrhosis type ([place])   4. Goals of care, counseling/discussion   5. Neoplasm related pain     Dr. Randa Evens, MD, MPH [facility] at [facility] [phone] 11/16/2017 2:23 PM    Addendum: Upon further review lenvatinib has not been studied in patients with chronic liver dysfunction.  I will therefore plan to start him on sorafenib 200 mg twice daily after GI consultation and possible treatment of his hepatitis  Dr. Randa Evens, MD, MPH [facility] at [facility] [phone] 11/17/2017 8:18 AM

## 2017-11-17 LAB — CYTOLOGY - NON PAP

## 2017-11-17 LAB — PROTEIN, BODY FLUID (OTHER): Total Protein, Body Fluid Other: 0.7 g/dL

## 2017-11-19 LAB — BODY FLUID CULTURE: Culture: NO GROWTH

## 2017-11-20 ENCOUNTER — Encounter: Payer: Self-pay | Admitting: *Deleted

## 2017-11-20 LAB — LIPASE, FLUID: LIPASE-FLUID: 9 U/L

## 2017-11-21 LAB — AEROBIC/ANAEROBIC CULTURE W GRAM STAIN (SURGICAL/DEEP WOUND)
Culture: NO GROWTH
Gram Stain: NONE SEEN

## 2017-11-23 ENCOUNTER — Encounter: Payer: Self-pay | Admitting: *Deleted

## 2017-11-23 ENCOUNTER — Telehealth: Payer: Self-pay | Admitting: *Deleted

## 2017-11-23 ENCOUNTER — Encounter: Payer: Self-pay | Admitting: Hospice and Palliative Medicine

## 2017-11-23 ENCOUNTER — Ambulatory Visit
Admission: RE | Admit: 2017-11-23 | Discharge: 2017-11-23 | Disposition: A | Discharge status: Home / Self Care | Payer: Medicare Other | Source: Ambulatory Visit | Attending: Oncology | Admitting: Oncology

## 2017-11-23 ENCOUNTER — Inpatient Hospital Stay: Payer: Medicare Other

## 2017-11-23 ENCOUNTER — Other Ambulatory Visit: Payer: Self-pay | Admitting: *Deleted

## 2017-11-23 ENCOUNTER — Inpatient Hospital Stay (HOSPITAL_BASED_OUTPATIENT_CLINIC_OR_DEPARTMENT_OTHER): Payer: Medicare Other | Admitting: Hospice and Palliative Medicine

## 2017-11-23 VITALS — BP 123/71 | HR 118 | Temp 97.1°F | Resp 20 | Ht 72.0 in | Wt 154.7 lb

## 2017-11-23 DIAGNOSIS — R188 Other ascites: Secondary | ICD-10-CM | POA: Diagnosis not present

## 2017-11-23 DIAGNOSIS — B1910 Unspecified viral hepatitis B without hepatic coma: Secondary | ICD-10-CM

## 2017-11-23 DIAGNOSIS — F17210 Nicotine dependence, cigarettes, uncomplicated: Secondary | ICD-10-CM

## 2017-11-23 DIAGNOSIS — I2510 Atherosclerotic heart disease of native coronary artery without angina pectoris: Secondary | ICD-10-CM

## 2017-11-23 DIAGNOSIS — Z7901 Long term (current) use of anticoagulants: Secondary | ICD-10-CM

## 2017-11-23 DIAGNOSIS — R911 Solitary pulmonary nodule: Secondary | ICD-10-CM | POA: Diagnosis not present

## 2017-11-23 DIAGNOSIS — B182 Chronic viral hepatitis C: Secondary | ICD-10-CM

## 2017-11-23 DIAGNOSIS — Z7902 Long term (current) use of antithrombotics/antiplatelets: Secondary | ICD-10-CM

## 2017-11-23 DIAGNOSIS — J449 Chronic obstructive pulmonary disease, unspecified: Secondary | ICD-10-CM

## 2017-11-23 DIAGNOSIS — Z515 Encounter for palliative care: Secondary | ICD-10-CM | POA: Diagnosis not present

## 2017-11-23 DIAGNOSIS — K219 Gastro-esophageal reflux disease without esophagitis: Secondary | ICD-10-CM

## 2017-11-23 DIAGNOSIS — Z79899 Other long term (current) drug therapy: Secondary | ICD-10-CM

## 2017-11-23 DIAGNOSIS — F17200 Nicotine dependence, unspecified, uncomplicated: Secondary | ICD-10-CM | POA: Diagnosis not present

## 2017-11-23 DIAGNOSIS — C220 Liver cell carcinoma: Secondary | ICD-10-CM

## 2017-11-23 DIAGNOSIS — I509 Heart failure, unspecified: Secondary | ICD-10-CM

## 2017-11-23 DIAGNOSIS — K7460 Unspecified cirrhosis of liver: Secondary | ICD-10-CM

## 2017-11-23 DIAGNOSIS — M1990 Unspecified osteoarthritis, unspecified site: Secondary | ICD-10-CM

## 2017-11-23 DIAGNOSIS — J438 Other emphysema: Secondary | ICD-10-CM | POA: Diagnosis not present

## 2017-11-23 DIAGNOSIS — I252 Old myocardial infarction: Secondary | ICD-10-CM

## 2017-11-23 DIAGNOSIS — G893 Neoplasm related pain (acute) (chronic): Secondary | ICD-10-CM | POA: Diagnosis not present

## 2017-11-23 DIAGNOSIS — I700 Atherosclerosis of aorta: Secondary | ICD-10-CM | POA: Diagnosis not present

## 2017-11-23 DIAGNOSIS — J432 Centrilobular emphysema: Secondary | ICD-10-CM | POA: Insufficient documentation

## 2017-11-23 DIAGNOSIS — Z8719 Personal history of other diseases of the digestive system: Secondary | ICD-10-CM

## 2017-11-23 LAB — COMPREHENSIVE METABOLIC PANEL
ALT: 42 U/L (ref 0–44)
AST: 231 U/L — ABNORMAL HIGH (ref 15–41)
Albumin: 3.5 g/dL (ref 3.5–5.0)
Alkaline Phosphatase: 190 U/L — ABNORMAL HIGH (ref 38–126)
Anion gap: 8 (ref 5–15)
BUN: 17 mg/dL (ref 6–20)
CO2: 24 mmol/L (ref 22–32)
Calcium: 9.7 mg/dL (ref 8.9–10.3)
Chloride: 98 mmol/L (ref 98–111)
Creatinine, Ser: 1.05 mg/dL (ref 0.61–1.24)
GFR calc Af Amer: >60 mL/min (ref >60)
GFR calc non Af Amer: >60 mL/min (ref >60)
Glucose, Bld: 128 mg/dL — ABNORMAL HIGH (ref 70–99)
Potassium: 4.4 mmol/L (ref 3.5–5.1)
Sodium: 130 mmol/L — ABNORMAL LOW (ref 135–145)
Total Bilirubin: 3 mg/dL — ABNORMAL HIGH (ref 0.3–1.2)
Total Protein: 9.1 g/dL — ABNORMAL HIGH (ref 6.5–8.1)

## 2017-11-23 LAB — CBC WITH DIFFERENTIAL/PLATELET
Abs Immature Granulocytes: 0.02 10*3/uL (ref 0.00–0.07)
Basophils Absolute: 0.1 10*3/uL (ref 0.0–0.1)
Basophils Relative: 1 %
Eosinophils Absolute: 0.4 10*3/uL (ref 0.0–0.5)
Eosinophils Relative: 5 %
HCT: 35.2 % — ABNORMAL LOW (ref 39.0–52.0)
Hemoglobin: 11.8 g/dL — ABNORMAL LOW (ref 13.0–17.0)
Immature Granulocytes: 0 %
Lymphocytes Relative: 16 %
Lymphs Abs: 1.2 10*3/uL (ref 0.7–4.0)
MCH: 26.5 pg (ref 26.0–34.0)
MCHC: 33.5 g/dL (ref 30.0–36.0)
MCV: 78.9 fL — ABNORMAL LOW (ref 80.0–100.0)
Monocytes Absolute: 0.7 10*3/uL (ref 0.1–1.0)
Monocytes Relative: 9 %
Neutro Abs: 5.2 10*3/uL (ref 1.7–7.7)
Neutrophils Relative %: 69 %
Platelets: 145 10*3/uL — ABNORMAL LOW (ref 150–400)
RBC: 4.46 MIL/uL (ref 4.22–5.81)
RDW: 18.9 % — ABNORMAL HIGH (ref 11.5–15.5)
WBC: 7.6 10*3/uL (ref 4.0–10.5)
nRBC: 0 % (ref 0.0–0.2)

## 2017-11-23 LAB — PROTIME-INR
INR: 1.38
PROTHROMBIN TIME: 16.8 s — AB (ref 11.4–15.2)

## 2017-11-23 MED ORDER — OXYCODONE HCL 5 MG PO TABS: 10.0000 mg | ORAL_TABLET | Freq: Four times a day (QID) | ORAL | 0 refills | Status: DC | PRN

## 2017-11-23 MED ORDER — OXYCODONE HCL 10 MG PO TABS: 10.0000 mg | ORAL_TABLET | ORAL | 0 refills | Status: DC | PRN

## 2017-11-23 NOTE — Addendum Note (Signed)
Addended by: Irean Hong on: 11/23/2017 06:04 PM   Modules accepted: Orders

## 2017-11-23 NOTE — Telephone Encounter (Signed)
Oxycodone 10mg  tablets ordered  Take one tablet by mouth q4h prn for pain, #20, no refills

## 2017-11-23 NOTE — Telephone Encounter (Signed)
Patient insurance is only allowing 6 tabs if Oxy 5 mg per day without a prior authorization, Can we try to order the 10 mg tabs for him and see if they will pay for that?

## 2017-11-23 NOTE — Progress Notes (Addendum)
[place]  [phone] [phone]   Name: Phillip Warren Date: 11/23/2017 MRN: [id number]  DOB: 04/11/1955  Patient Care Team: Glendon Axe, MD as PCP - General (Internal Medicine) Wellington Hampshire, MD as PCP - Cardiology (Cardiology) Clent Jacks, RN as Registered Nurse    REASON FOR CONSULTATION: Palliative Care consult requested for this 62 y.o. male with multiple medical problems including hepatocellular carcinoma initially diagnosed in 2016.  Patient underwent ablation and then was subsequently lost to follow-up.  PMH is also notable for history of cirrhosis, active hepatitis B and C, CAD, ischemic cardiomyopathy with EF of 25 to 30% per ECHO in February 2019, history of polysubstance abuse with alcohol/THC and IV drugs, history of GI bleed, and COPD.  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  He underwent MRCP which revealed cholelithiasis but no evidence of cholecystitis.  Patient was evaluated by both GI and vascular surgery and was felt not to be a surgical candidate.  He was anticoagulated with warfarin.  Patient was referred to outpatient GI and has establish care with oncology.  He was referred to palliative care for symptom management and to help establish goals of care.   SOCIAL HISTORY:    Patient is widowed.  His wife died of cancer.  He lives alone in an apartment.  Patient has a daughter who lives nearby and is involved in his care.  Patient has 2 sons and 2 daughters.  Patient used to work as a Curator.  ADVANCE DIRECTIVES:  Does not have  CODE STATUS: Full code  PAST MEDICAL HISTORY: Past Medical History:  Diagnosis Date  . Alcohol abuse   . Anorexia    has trouble eating due to poor appetite  . Anxiety    trouble sleeping; lost two sisters and his mother close together    . Arthritis    hands, feet (burning, stinging)  . Cancer [facility])    liver lesion initial staging.  . CHF (congestive heart failure) ([place])   . Cirrhosis of liver ([place])   . Cocaine abuse ([place])   . COPD (chronic obstructive pulmonary disease) ([place])   . Coronary artery disease    Inferior ST elevation myocardial infarction in February 2019.  Cardiac catheterization showed subtotal occlusion of mid right coronary artery with severe disease affecting the LAD and left circumflex.  Successful PCI and 2 drug-eluting stent placement to the RCA.  He returned a few days after hospital discharge with stent thrombosis due to not taking any of his cardiac medications.   Marland Kitchen GERD (gastroesophageal reflux disease)   . H/O dizziness   . Headache   . Hepatitis    B and C (chronic)  . Marijuana use   . Myocardial infarction ([place])    massive MI at age 23  . Neuropathy   . Numbness and tingling    legs and feet bilat   . Pneumonia   . Repeated falls   . Shortness of breath dyspnea   . Tinnitus   . Tobacco abuse     PAST SURGICAL HISTORY:  Past Surgical History:  Procedure Laterality Date  . CARDIAC SURGERY    . COLONOSCOPY WITH PROPOFOL N/A 08/31/2014   Procedure: COLONOSCOPY WITH PROPOFOL;  Surgeon: Josefine Class, MD;  Location: [facility] ENDOSCOPY;  Service: Endoscopy;  Laterality: N/A;  . CORONARY ANGIOPLASTY  at age 57 secondary to massive MI  . CORONARY/GRAFT ACUTE MI REVASCULARIZATION N/A 03/09/2017   Procedure: Coronary/Graft Acute MI Revascularization;  Surgeon: Wellington Hampshire, MD;  Location: [place] CV LAB;  Service: Cardiovascular;  Laterality: N/A;  . CORONARY/GRAFT ACUTE MI REVASCULARIZATION N/A 03/16/2017   Procedure: Coronary/Graft Acute MI Revascularization;  Surgeon: Wellington Hampshire, MD;  Location: [place] CV LAB;  Service: Cardiovascular;  Laterality: N/A;  balloon only RCA  . ESOPHAGOGASTRODUODENOSCOPY (EGD) WITH PROPOFOL N/A 08/31/2014   Procedure:  ESOPHAGOGASTRODUODENOSCOPY (EGD) WITH PROPOFOL;  Surgeon: Josefine Class, MD;  Location: [facility] ENDOSCOPY;  Service: Endoscopy;  Laterality: N/A;  . ESOPHAGOGASTRODUODENOSCOPY (EGD) WITH PROPOFOL N/A 01/02/2017   Procedure: ESOPHAGOGASTRODUODENOSCOPY (EGD) WITH PROPOFOL;  Surgeon: [place], Benay Pike, MD;  Location: [facility] ENDOSCOPY;  Service: Gastroenterology;  Laterality: N/A;  . LEFT HEART CATH AND CORONARY ANGIOGRAPHY N/A 03/09/2017   Procedure: LEFT HEART CATH AND CORONARY ANGIOGRAPHY;  Surgeon: Wellington Hampshire, MD;  Location: [place] CV LAB;  Service: Cardiovascular;  Laterality: N/A;  . LEFT HEART CATH AND CORONARY ANGIOGRAPHY N/A 03/16/2017   Procedure: LEFT HEART CATH AND CORONARY ANGIOGRAPHY;  Surgeon: Wellington Hampshire, MD;  Location: [place] CV LAB;  Service: Cardiovascular;  Laterality: N/A;    HEMATOLOGY/ONCOLOGY HISTORY:   No history exists.    ALLERGIES:  is allergic to multihance [gadobenate]; hydrocodone; other; and vicodin [hydrocodone-acetaminophen].  MEDICATIONS:  Current Outpatient Medications  Medication Sig Dispense Refill  . atorvastatin (LIPITOR) 10 MG tablet Take 1 tablet (10 mg total) by mouth daily at 6 PM. 30 tablet 3  . clopidogrel (PLAVIX) 75 MG tablet Take 1 tablet (75 mg total) by mouth daily with breakfast. 30 tablet 5  . gabapentin (NEURONTIN) 800 MG tablet Take 800 mg by mouth 4 (four) times daily.    Marland Kitchen lactulose (CHRONULAC) 10 GM/15ML solution Take 45 mLs (30 g total) by mouth 2 (two) times daily. 240 mL 0  . lisinopril (PRINIVIL,ZESTRIL) 2.5 MG tablet Take 1 tablet (2.5 mg total) by mouth daily. 30 tablet 5  . metoprolol succinate (TOPROL-XL) 25 MG 24 hr tablet Take 0.5 tablets (12.5 mg total) by mouth daily. 30 tablet 6  . nicotine (NICODERM CQ - DOSED IN MG/24 HOURS) 21 mg/24hr patch Place 1 patch (21 mg total) onto the skin daily. (Patient not taking: Reported on 11/13/2017) 28 patch 0  . nitroGLYCERIN (NITROSTAT) 0.4 MG SL tablet Place 0.4  mg under the tongue every 5 (five) minutes as needed for chest pain.     Marland Kitchen ondansetron (ZOFRAN ODT) 4 MG disintegrating tablet Take 1 tablet (4 mg total) by mouth every 8 (eight) hours as needed for nausea or vomiting. 20 tablet 0  . ondansetron (ZOFRAN) 4 MG tablet Take 1 tablet (4 mg total) by mouth every 4 (four) hours as needed for nausea. (Patient not taking: Reported on 11/13/2017) 20 tablet 0  . oxyCODONE (ROXICODONE) 5 MG immediate release tablet Take 1 tablet (5 mg total) by mouth every 6 (six) hours as needed for severe pain. (Patient taking differently: Take 10 mg by mouth every 6 (six) hours as needed for severe pain. ) 56 tablet 0  . pantoprazole (PROTONIX) 40 MG tablet Take 1 tablet (40 mg total) by mouth daily. 30 tablet 11  . warfarin (COUMADIN) 2.5 MG tablet Take 1 tablet (2.5 mg total) by mouth daily. 30 tablet 0   No current facility-administered medications for this visit.     VITAL SIGNS: BP 123/71 (BP Location: Right  Arm, Patient Position: Sitting)   Pulse (!) 118   Temp (!) 97.1 F (36.2 C) (Tympanic)   Resp 20   Ht 6' (1.829 m)   Wt 154 lb 11.2 oz (70.2 kg)   SpO2 96%   BMI 20.98 kg/m  Filed Weights   11/23/17 1356  Weight: 154 lb 11.2 oz (70.2 kg)    Estimated body mass index is 20.98 kg/m as calculated from the following:   Height as of this encounter: 6' (1.829 m).   Weight as of this encounter: 154 lb 11.2 oz (70.2 kg).  LABS: CBC:    Component Value Date/Time   WBC 5.2 11/13/2017 1102   HGB 11.1 (L) 11/13/2017 1102   HGB 16.4 05/14/2014 1606   HCT 33.7 (L) 11/13/2017 1102   HCT 49.7 05/14/2014 1606   PLT 184 11/13/2017 1102   PLT 111 (L) 05/14/2014 1606   MCV 80.8 11/13/2017 1102   MCV 96 05/14/2014 1606   NEUTROABS 3.4 11/13/2017 1102   NEUTROABS 3.4 05/14/2014 1606   LYMPHSABS 0.9 11/13/2017 1102   LYMPHSABS 1.8 05/14/2014 1606   MONOABS 0.4 11/13/2017 1102   MONOABS 0.5 05/14/2014 1606   EOSABS 0.4 11/13/2017 1102   EOSABS 0.2  05/14/2014 1606   BASOSABS 0.1 11/13/2017 1102   BASOSABS 0.0 05/14/2014 1606   Comprehensive Metabolic Panel:    Component Value Date/Time   NA 132 (L) 11/13/2017 1102   NA 135 05/14/2014 1606   K 3.9 11/13/2017 1102   K 4.2 05/14/2014 1606   CL 96 (L) 11/13/2017 1102   CL 99 (L) 05/14/2014 1606   CO2 25 11/13/2017 1102   CO2 26 05/14/2014 1606   BUN 12 11/13/2017 1102   BUN 17 05/14/2014 1606   CREATININE 1.00 11/13/2017 1102   CREATININE 1.14 05/14/2014 1606   GLUCOSE 134 (H) 11/13/2017 1102   GLUCOSE 109 (H) 05/14/2014 1606   CALCIUM 9.2 11/13/2017 1102   CALCIUM 9.0 05/14/2014 1606   AST 178 (H) 11/13/2017 1102   AST 162 (H) 05/14/2014 1606   ALT 37 11/13/2017 1102   ALT 184 (H) 05/14/2014 1606   ALKPHOS 167 (H) 11/13/2017 1102   ALKPHOS 101 05/14/2014 1606   BILITOT 2.4 (H) 11/13/2017 1102   BILITOT 0.5 07/24/2017 1203   BILITOT 0.7 05/14/2014 1606   PROT 7.9 11/13/2017 1102   PROT 7.7 07/24/2017 1203   PROT 8.8 (H) 05/14/2014 1606   ALBUMIN 3.1 (L) 11/13/2017 1102   ALBUMIN 3.8 07/24/2017 1203   ALBUMIN 3.9 05/14/2014 1606    RADIOGRAPHIC STUDIES: US Paracentesis  Result Date: 11/16/2017 INDICATION: History infiltrative hepatocellular carcinoma and malignant portal vein thrombosis, now with symptomatic intra-abdominal ascites. Please perform ultrasound-guided paracentesis for both therapeutic and diagnostic purposes. EXAM: ULTRASOUND-GUIDED PARACENTESIS COMPARISON:  MRCP-10/22/2017 MEDICATIONS: None. COMPLICATIONS: None immediate. TECHNIQUE: Informed written consent was obtained from the patient after a discussion of the risks, benefits and alternatives to treatment. A timeout was performed prior to the initiation of the procedure. Initial ultrasound scanning demonstrates a moderate to large amount of ascites within the right lower abdominal quadrant. The right lower abdomen was prepped and draped in the usual sterile fashion. 1% lidocaine with epinephrine was used  for local anesthesia. An ultrasound image was saved for documentation purposed. An 8 Fr Safe-T-Centesis catheter was introduced. The paracentesis was performed. The catheter was removed and a dressing was applied. The patient tolerated the procedure well without immediate post procedural complication. FINDINGS: A total of approximately 6.9 liters of  serous fluid was removed. Samples were sent to the laboratory as requested by the clinical team. IMPRESSION: Successful ultrasound-guided paracentesis yielding 6.9 liters of peritoneal fluid. Electronically Signed   By: Sandi Mariscal M.D.   On: 11/16/2017 11:55    PERFORMANCE STATUS (ECOG) : 2 - Symptomatic, <50% confined to bed  Review of Systems As noted above. Otherwise, a complete review of systems is negative.  Physical Exam General: frail appearing, thin Cardiovascular: regular rate and rhythm Pulmonary: clear ant fields Abdomen: distended, tympanic, + fluid wave Extremities: no edema Skin: no rashes Neurological: Weakness but otherwise nonfocal  IMPRESSION: Patient was scheduled to see both me and Dr. Janese Banks tomorrow.  However, he called the triage line complaining of worsening abdominal pain.  Patient was scheduled as an add-on today for evaluation.  Patient underwent large-volume paracentesis on 11/16/2017 with 6.9 L of ascitic fluid withdrawn.  Ascitic fluid was cultured with no growth noted.  Patient has had rapidly progressive abdominal distention with associated increased pain.  Complains of generalized abdominal pain.  He denies fevers but says he has chronic chills.  He has had some nausea and vomiting but says this is associated primarily when the pain becomes severe.  He describes regular bowel movements.  Patient has been managing the pain with PRN oxycodone.  He says that he had already doubled the dose and was taking two 5 mg tablets every 4 hours around-the-clock.  He says that initially the oxycodone seem to be helping the pain but  is no longer as efficacious.  Note that it appears patient was tried on transdermal fentanyl by Dr. Glendon Axe.  However, patient says this was stopped as it worsened his nausea.  The [facility] was reviewed.  Patient says he used his last oxycodone today.  I suspect the patient has recurrent ascites.  The rapid reaccumulation might indicate future benefit from insertion of a Tenckhoff catheter.  However, for now we will pursue paracentesis.  Note that it does not appear the patient is currently on diuretics for management of ascites.  Will call GI to see if patient can be scheduled for follow-up.  I briefly discussed patient's general treatment goals and advance care planning.  Patient states that he is interested in treatment if it would improve his overall condition.  He says he recognizes that his medical problems will ultimately result in his death.  He says he has been told that he has about a year to live.  Patient does not currently have advance directives but is interested in establishing those.  We will have more in-depth conversations regarding these issues once he is symptomatically improved.  Case discussed with supervising physician.  PLAN: We will check CBC, CMET, PT/INR We will schedule paracentesis for tomorrow Oxycodone 10 mg tablets, take 1 tablets every 4 hours as needed for pain , #20, no refills Pain contract completed RTC tomorrow   Patient expressed understanding and was in agreement with this plan. He also understands that He can call clinic at any time with any questions, concerns, or complaints.    Time Total: 60 minutes  Visit consisted of counseling and education dealing with the complex and emotionally intense issues of symptom management and palliative care in the setting of serious and potentially life-threatening illness.Greater than 50%  of this time was spent counseling and coordinating care related to the above assessment and plan.  Signed  by: Altha Harm, [place], NP-C, [place] (Work Cell)

## 2017-11-23 NOTE — Telephone Encounter (Signed)
Call returned to patient and advised to take 2 tablets Oxycodone every 6 hours as needed and she will see him tomorrow. He said he didn't know if he can make it until tomorrow. I again stated that is why she said to take 2 tabs of your pain medicine. Josh borders offered to see patient today and patient accepted appointment after his Xray appointment

## 2017-11-23 NOTE — Telephone Encounter (Signed)
Called pt and the next of kin-Phillip Warren. Left messages that pt tohave paracentesis tom. And [facility] 1:30 procedure.  Will try calling tom. Also. We changed his appt to see Dr. Janese Banks til Thursday due to the appt for paracentesis is at the same time.

## 2017-11-23 NOTE — Telephone Encounter (Signed)
Can he double up his oxycodone and see if It helps? I see him tomorrow

## 2017-11-23 NOTE — Telephone Encounter (Signed)
Patient called reporting that his abdominal pain is unbearable and would like something to be done for it. Please advise

## 2017-11-24 ENCOUNTER — Inpatient Hospital Stay: Payer: Medicare Other | Admitting: Hospice and Palliative Medicine

## 2017-11-24 ENCOUNTER — Ambulatory Visit
Admission: RE | Admit: 2017-11-24 | Discharge: 2017-11-24 | Disposition: A | Discharge status: Home / Self Care | Payer: Medicare Other | Source: Ambulatory Visit | Attending: Oncology | Admitting: Oncology

## 2017-11-24 ENCOUNTER — Inpatient Hospital Stay: Payer: Medicare Other | Admitting: Oncology

## 2017-11-24 DIAGNOSIS — R188 Other ascites: Secondary | ICD-10-CM | POA: Insufficient documentation

## 2017-11-24 DIAGNOSIS — C220 Liver cell carcinoma: Secondary | ICD-10-CM | POA: Diagnosis present

## 2017-11-24 DIAGNOSIS — K7460 Unspecified cirrhosis of liver: Secondary | ICD-10-CM

## 2017-11-24 NOTE — Discharge Instructions (Signed)
Paracentesis, Care After °Refer to this sheet in the next few weeks. These instructions provide you with information about caring for yourself after your procedure. Your health care provider may also give you more specific instructions. Your treatment has been planned according to current medical practices, but problems sometimes occur. Call your health care provider if you have any problems or questions after your procedure. °What can I expect after the procedure? °After your procedure, it is common to have a small amount of clear fluid coming from the puncture site. °Follow these instructions at home: °· Return to your normal activities as told by your health care provider. Ask your health care provider what activities are safe for you. °· Take over-the-counter and prescription medicines only as told by your health care provider. °· Do not take baths, swim, or use a hot tub until your health care provider approves. °· Follow instructions from your health care provider about: °? How to take care of your puncture site. °? When and how you should change your bandage (dressing). °? When you should remove your dressing. °· Check your puncture area every day signs of infection. Watch for: °? Redness, swelling, or pain. °? Fluid, blood, or pus. °· Keep all follow-up visits as told by your health care provider. This is important. °Contact a health care provider if: °· You have redness, swelling, or pain at your puncture site. °· You start to have more clear fluid coming from your puncture site. °· You have blood or pus coming from your puncture site. °· You have chills. °· You have a fever. °Get help right away if: °· You develop chest pain or shortness of breath. °· You develop increasing pain, discomfort, or swelling in your abdomen. °· You feel dizzy or light-headed or you pass out. °This information is not intended to replace advice given to you by your health care provider. Make sure you discuss any questions you  have with your health care provider. °Document Released: 05/30/2014 Document Revised: 06/21/2015 Document Reviewed: 03/28/2014 °Elsevier Interactive Patient Education © 2018 [employer] ° °

## 2017-11-26 ENCOUNTER — Telehealth: Payer: Self-pay | Admitting: Hospice and Palliative Medicine

## 2017-11-26 ENCOUNTER — Inpatient Hospital Stay (HOSPITAL_BASED_OUTPATIENT_CLINIC_OR_DEPARTMENT_OTHER): Payer: Medicare Other | Admitting: Oncology

## 2017-11-26 ENCOUNTER — Encounter: Payer: Self-pay | Admitting: Oncology

## 2017-11-26 ENCOUNTER — Inpatient Hospital Stay (HOSPITAL_BASED_OUTPATIENT_CLINIC_OR_DEPARTMENT_OTHER): Payer: Medicare Other | Admitting: Hospice and Palliative Medicine

## 2017-11-26 VITALS — BP 119/75 | HR 107 | Temp 97.7°F | Resp 18 | Ht 72.0 in | Wt 149.6 lb

## 2017-11-26 DIAGNOSIS — G893 Neoplasm related pain (acute) (chronic): Secondary | ICD-10-CM

## 2017-11-26 DIAGNOSIS — M1990 Unspecified osteoarthritis, unspecified site: Secondary | ICD-10-CM

## 2017-11-26 DIAGNOSIS — R180 Malignant ascites: Secondary | ICD-10-CM

## 2017-11-26 DIAGNOSIS — F17210 Nicotine dependence, cigarettes, uncomplicated: Secondary | ICD-10-CM

## 2017-11-26 DIAGNOSIS — C220 Liver cell carcinoma: Secondary | ICD-10-CM | POA: Diagnosis not present

## 2017-11-26 DIAGNOSIS — Z7901 Long term (current) use of anticoagulants: Secondary | ICD-10-CM

## 2017-11-26 DIAGNOSIS — R5381 Other malaise: Secondary | ICD-10-CM

## 2017-11-26 DIAGNOSIS — B1910 Unspecified viral hepatitis B without hepatic coma: Secondary | ICD-10-CM

## 2017-11-26 DIAGNOSIS — I252 Old myocardial infarction: Secondary | ICD-10-CM

## 2017-11-26 DIAGNOSIS — K219 Gastro-esophageal reflux disease without esophagitis: Secondary | ICD-10-CM

## 2017-11-26 DIAGNOSIS — R531 Weakness: Secondary | ICD-10-CM

## 2017-11-26 DIAGNOSIS — I509 Heart failure, unspecified: Secondary | ICD-10-CM

## 2017-11-26 DIAGNOSIS — Z7902 Long term (current) use of antithrombotics/antiplatelets: Secondary | ICD-10-CM

## 2017-11-26 DIAGNOSIS — Z8719 Personal history of other diseases of the digestive system: Secondary | ICD-10-CM

## 2017-11-26 DIAGNOSIS — K7460 Unspecified cirrhosis of liver: Secondary | ICD-10-CM

## 2017-11-26 DIAGNOSIS — R188 Other ascites: Secondary | ICD-10-CM

## 2017-11-26 DIAGNOSIS — J449 Chronic obstructive pulmonary disease, unspecified: Secondary | ICD-10-CM

## 2017-11-26 DIAGNOSIS — Z66 Do not resuscitate: Secondary | ICD-10-CM

## 2017-11-26 DIAGNOSIS — Z515 Encounter for palliative care: Secondary | ICD-10-CM | POA: Diagnosis not present

## 2017-11-26 DIAGNOSIS — B182 Chronic viral hepatitis C: Secondary | ICD-10-CM

## 2017-11-26 DIAGNOSIS — I2510 Atherosclerotic heart disease of native coronary artery without angina pectoris: Secondary | ICD-10-CM

## 2017-11-26 DIAGNOSIS — Z79899 Other long term (current) drug therapy: Secondary | ICD-10-CM

## 2017-11-26 DIAGNOSIS — R5383 Other fatigue: Secondary | ICD-10-CM

## 2017-11-26 NOTE — Progress Notes (Signed)
Will arrange appointment with previous [facility] hepatologist with transportation.

## 2017-11-26 NOTE — Progress Notes (Addendum)
[place]  Telephone:([phone] [phone]   Name: Phillip Warren Date: 11/26/2017 MRN: [id number]  DOB: 09-Oct-1955  Patient Care Team: Glendon Axe, MD as PCP - General (Internal Medicine) Wellington Hampshire, MD as PCP - Cardiology (Cardiology) Efrain Sella, MD as PCP - Gastroenterology (Gastroenterology) Clent Jacks, RN as Registered Nurse    REASON FOR CONSULTATION: Palliative Care consult requested for this 62 y.o. male with multiple medical problems including hepatocellular carcinoma initially diagnosed in 2016.  Patient underwent ablation and then was subsequently lost to follow-up.  PMH is also notable for history of cirrhosis, active hepatitis B and C, CAD, ischemic cardiomyopathy with EF of 25 to 30% per ECHO in February 2019, history of polysubstance abuse with alcohol/THC and IV drugs, history of GI bleed, and COPD.  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  He underwent MRCP which revealed cholelithiasis but no evidence of cholecystitis.  Patient was evaluated by both GI and vascular surgery and was felt not to be a surgical candidate.  He was anticoagulated with warfarin.  Patient was referred to outpatient GI and has establish care with oncology.  He was referred to palliative care for symptom management and to help establish goals of care.   SOCIAL HISTORY:    Patient is widowed.  His wife died of cancer.  He lives alone in an apartment.  Patient has a daughter who lives nearby and is involved in his care.  Patient has 2 sons and 2 daughters.  Patient used to work as a Curator.  ADVANCE DIRECTIVES:  Does not have  CODE STATUS: DNR  PAST MEDICAL HISTORY: Past Medical History:  Diagnosis Date  . Alcohol abuse   . Anorexia    has trouble eating due to poor appetite  . Anxiety    trouble sleeping; lost two sisters and his mother close together  . Arthritis    hands, feet (burning, stinging)  . Cancer [facility])    liver lesion initial staging.  . CHF (congestive heart failure) ([place])   . Cirrhosis of liver ([place])   . Cocaine abuse ([place])   . COPD (chronic obstructive pulmonary disease) ([place])   . Coronary artery disease    Inferior ST elevation myocardial infarction in February 2019.  Cardiac catheterization showed subtotal occlusion of mid right coronary artery with severe disease affecting the LAD and left circumflex.  Successful PCI and 2 drug-eluting stent placement to the RCA.  He returned a few days after hospital discharge with stent thrombosis due to not taking any of his cardiac medications.   Marland Kitchen GERD (gastroesophageal reflux disease)   . H/O dizziness   . Headache   . Hepatitis    B and C (chronic)  . Marijuana use   . Myocardial infarction ([place])    massive MI at age 80  . Neuropathy   . Numbness and tingling    legs and feet bilat   . Pneumonia   . Repeated falls   . Shortness of breath dyspnea   . Tinnitus   . Tobacco abuse     PAST SURGICAL HISTORY:  Past Surgical History:  Procedure Laterality Date  . CARDIAC SURGERY    . COLONOSCOPY WITH PROPOFOL N/A 08/31/2014   Procedure: COLONOSCOPY WITH PROPOFOL;  Surgeon: Josefine Class, MD;  Location: [facility] ENDOSCOPY;  Service: Endoscopy;  Laterality:  N/A;  . CORONARY ANGIOPLASTY     at age 47 secondary to massive MI  . CORONARY/GRAFT ACUTE MI REVASCULARIZATION N/A 03/09/2017   Procedure: Coronary/Graft Acute MI Revascularization;  Surgeon: Wellington Hampshire, MD;  Location: [place] CV LAB;  Service: Cardiovascular;  Laterality: N/A;  . CORONARY/GRAFT ACUTE MI REVASCULARIZATION N/A 03/16/2017   Procedure: Coronary/Graft Acute MI Revascularization;  Surgeon: Wellington Hampshire, MD;  Location: [place] CV LAB;  Service: Cardiovascular;  Laterality: N/A;  balloon only RCA  . ESOPHAGOGASTRODUODENOSCOPY  (EGD) WITH PROPOFOL N/A 08/31/2014   Procedure: ESOPHAGOGASTRODUODENOSCOPY (EGD) WITH PROPOFOL;  Surgeon: Josefine Class, MD;  Location: [facility] ENDOSCOPY;  Service: Endoscopy;  Laterality: N/A;  . ESOPHAGOGASTRODUODENOSCOPY (EGD) WITH PROPOFOL N/A 01/02/2017   Procedure: ESOPHAGOGASTRODUODENOSCOPY (EGD) WITH PROPOFOL;  Surgeon: [place], Benay Pike, MD;  Location: [facility] ENDOSCOPY;  Service: Gastroenterology;  Laterality: N/A;  . LEFT HEART CATH AND CORONARY ANGIOGRAPHY N/A 03/09/2017   Procedure: LEFT HEART CATH AND CORONARY ANGIOGRAPHY;  Surgeon: Wellington Hampshire, MD;  Location: [place] CV LAB;  Service: Cardiovascular;  Laterality: N/A;  . LEFT HEART CATH AND CORONARY ANGIOGRAPHY N/A 03/16/2017   Procedure: LEFT HEART CATH AND CORONARY ANGIOGRAPHY;  Surgeon: Wellington Hampshire, MD;  Location: [place] CV LAB;  Service: Cardiovascular;  Laterality: N/A;    HEMATOLOGY/ONCOLOGY HISTORY:   No history exists.    ALLERGIES:  is allergic to multihance [gadobenate]; hydrocodone; other; and vicodin [hydrocodone-acetaminophen].  MEDICATIONS:  Current Outpatient Medications  Medication Sig Dispense Refill  . atorvastatin (LIPITOR) 10 MG tablet Take 1 tablet (10 mg total) by mouth daily at 6 PM. 30 tablet 3  . clopidogrel (PLAVIX) 75 MG tablet Take 1 tablet (75 mg total) by mouth daily with breakfast. 30 tablet 5  . gabapentin (NEURONTIN) 800 MG tablet Take 800 mg by mouth 4 (four) times daily.    Mar[place] Kitchen lactulose (CHRONULAC) 10 GM/15ML solution Take 45 mLs (30 g total) by mouth 2 (two) times daily. 240 mL 0  . lisinopril (PRINIVIL,ZESTRIL) 2.5 MG tablet Take 1 tablet (2.5 mg total) by mouth daily. 30 tablet 5  . metoprolol succinate (TOPROL-XL) 25 MG 24 hr tablet Take 0.5 tablets (12.5 mg total) by mouth daily. 30 tablet 6  . nicotine (NICODERM CQ - DOSED IN MG/24 HOURS) 21 mg/24hr patch Place 1 patch (21 mg total) onto the skin daily. (Patient not taking: Reported on 11/13/2017) 28 patch 0  .  nitroGLYCERIN (NITROSTAT) 0.4 MG SL tablet Place 0.4 mg under the tongue every 5 (five) minutes as needed for chest pain.     [place] Kitchen ondansetron (ZOFRAN ODT) 4 MG disintegrating tablet Take 1 tablet (4 mg total) by mouth every 8 (eight) hours as needed for nausea or vomiting. (Patient not taking: Reported on 11/26/2017) 20 tablet 0  . ondansetron (ZOFRAN) 4 MG tablet Take 1 tablet (4 mg total) by mouth every 4 (four) hours as needed for nausea. (Patient not taking: Reported on 11/13/2017) 20 tablet 0  . Oxycodone HCl 10 MG TABS Take 1 tablet (10 mg total) by mouth every 4 (four) hours as needed. 20 tablet 0  . pantoprazole (PROTONIX) 40 MG tablet Take 1 tablet (40 mg total) by mouth daily. 30 tablet 11  . warfarin (COUMADIN) 2.5 MG tablet Take 1 tablet (2.5 mg total) by mouth daily. 30 tablet 0   No current facility-administered medications for this visit.     VITAL SIGNS: There were no vitals taken for this visit. There were no vitals filed  for this visit.  Estimated body mass index is 20.29 kg/m as calculated from the following:   Height as of an earlier encounter on 11/26/17: 6' (1.829 m).   Weight as of an earlier encounter on 11/26/17: 149 lb 9.6 oz (67.9 kg).  LABS: CBC:    Component Value Date/Time   WBC 7.6 11/23/2017 1541   HGB 11.8 (L) 11/23/2017 1541   HGB 16.4 05/14/2014 1606   HCT 35.2 (L) 11/23/2017 1541   HCT 49.7 05/14/2014 1606   PLT 145 (L) 11/23/2017 1541   PLT 111 (L) 05/14/2014 1606   MCV 78.9 (L) 11/23/2017 1541   MCV 96 05/14/2014 1606   NEUTROABS 5.2 11/23/2017 1541   NEUTROABS 3.4 05/14/2014 1606   LYMPHSABS 1.2 11/23/2017 1541   LYMPHSABS 1.8 05/14/2014 1606   MONOABS 0.7 11/23/2017 1541   MONOABS 0.5 05/14/2014 1606   EOSABS 0.4 11/23/2017 1541   EOSABS 0.2 05/14/2014 1606   BASOSABS 0.1 11/23/2017 1541   BASOSABS 0.0 05/14/2014 1606   Comprehensive Metabolic Panel:    Component Value Date/Time   NA 130 (L) 11/23/2017 1541   NA 135 05/14/2014 1606     K 4.4 11/23/2017 1541   K 4.2 05/14/2014 1606   CL 98 11/23/2017 1541   CL 99 (L) 05/14/2014 1606   CO2 24 11/23/2017 1541   CO2 26 05/14/2014 1606   BUN 17 11/23/2017 1541   BUN 17 05/14/2014 1606   CREATININE 1.05 11/23/2017 1541   CREATININE 1.14 05/14/2014 1606   GLUCOSE 128 (H) 11/23/2017 1541   GLUCOSE 109 (H) 05/14/2014 1606   CALCIUM 9.7 11/23/2017 1541   CALCIUM 9.0 05/14/2014 1606   AST 231 (H) 11/23/2017 1541   AST 162 (H) 05/14/2014 1606   ALT 42 11/23/2017 1541   ALT 184 (H) 05/14/2014 1606   ALKPHOS 190 (H) 11/23/2017 1541   ALKPHOS 101 05/14/2014 1606   BILITOT 3.0 (H) 11/23/2017 1541   BILITOT 0.5 07/24/2017 1203   BILITOT 0.7 05/14/2014 1606   PROT 9.1 (H) 11/23/2017 1541   PROT 7.7 07/24/2017 1203   PROT 8.8 (H) 05/14/2014 1606   ALBUMIN 3.5 11/23/2017 1541   ALBUMIN 3.8 07/24/2017 1203   ALBUMIN 3.9 05/14/2014 1606    RADIOGRAPHIC STUDIES: Ct Chest Wo Contrast  Result Date: 11/23/2017 CLINICAL DATA:  Recurrent hepatocellular carcinoma. Staging. Chest pain and shortness of breath. Recent cardiac stent. Smoker. EXAM: CT CHEST WITHOUT CONTRAST TECHNIQUE: Multidetector CT imaging of the chest was performed following the standard protocol without IV contrast. COMPARISON:  Abdominal MRI 10/22/2017. Chest radiograph 10/22/2017. Most recent chest CT 08/30/2014. FINDINGS: Cardiovascular: Aortic and branch vessel atherosclerosis. Normal heart size, without pericardial effusion. Multivessel coronary artery atherosclerosis. Mediastinum/Nodes: No supraclavicular adenopathy. No mediastinal or definite hilar adenopathy, given limitations of unenhanced CT. Lungs/Pleura: No pleural fluid. Lower lobe predominant bronchial wall thickening. Minimal motion degradation in the lower chest. Mild centrilobular and paraseptal emphysema. Bibasilar scarring or subsegmental atelectasis. Interstitial thickening in the posterior left upper lobe with adjacent left major fissure thickening,  similar including on image 53/3 indicative of scarring. Minimal biapical pleuroparenchymal scarring. Suspect a 4 mm right middle lobe pulmonary nodule on image 87/3. Not readily apparent on the prior exam, possibly due to thinner slice collimation today. Upper Abdomen: Cirrhosis. Abdominal ascites. Suboptimal visualization of previously described dominant hepatic mass, secondary to noncontrast technique. Grossly normal imaged portions of the adrenal glands, left kidney, spleen, stomach. Musculoskeletal: No acute osseous abnormality. Mild right hemidiaphragm elevation. IMPRESSION: 1.  No acute process or evidence of metastatic disease in the chest. 2. Right middle lobe isolated 4 mm nodule, not readily apparent on the prior. Recommend attention on follow-up. 3. Aortic atherosclerosis (ICD10-I70.0), coronary artery atherosclerosis and emphysema (ICD10-J43.9). Electronically Signed   By: Abigail Miyamoto M.D.   On: 11/23/2017 16:06   US Paracentesis  Result Date: 11/24/2017 INDICATION: Ascites EXAM: ULTRASOUND GUIDED  PARACENTESIS MEDICATIONS: None. COMPLICATIONS: None immediate. PROCEDURE: Informed written consent was obtained from the patient after a discussion of the risks, benefits and alternatives to treatment. A timeout was performed prior to the initiation of the procedure. Initial ultrasound scanning demonstrates a large amount of ascites within the right lower abdominal quadrant. The right lower abdomen was prepped and draped in the usual sterile fashion. 1% lidocaine with epinephrine was used for local anesthesia. Following this, a 6 Fr Safe-T-Centesis catheter was introduced. An ultrasound image was saved for documentation purposes. The paracentesis was performed. The catheter was removed and a dressing was applied. The patient tolerated the procedure well without immediate post procedural complication. FINDINGS: A total of approximately 4.4 L of clear yellow fluid was removed. IMPRESSION: Successful  ultrasound-guided paracentesis yielding 4.4 liters of peritoneal fluid. Electronically Signed   By: Marybelle Killings M.D.   On: 11/24/2017 14:28   US Paracentesis  Result Date: 11/16/2017 INDICATION: History infiltrative hepatocellular carcinoma and malignant portal vein thrombosis, now with symptomatic intra-abdominal ascites. Please perform ultrasound-guided paracentesis for both therapeutic and diagnostic purposes. EXAM: ULTRASOUND-GUIDED PARACENTESIS COMPARISON:  MRCP-10/22/2017 MEDICATIONS: None. COMPLICATIONS: None immediate. TECHNIQUE: Informed written consent was obtained from the patient after a discussion of the risks, benefits and alternatives to treatment. A timeout was performed prior to the initiation of the procedure. Initial ultrasound scanning demonstrates a moderate to large amount of ascites within the right lower abdominal quadrant. The right lower abdomen was prepped and draped in the usual sterile fashion. 1% lidocaine with epinephrine was used for local anesthesia. An ultrasound image was saved for documentation purposed. An 8 Fr Safe-T-Centesis catheter was introduced. The paracentesis was performed. The catheter was removed and a dressing was applied. The patient tolerated the procedure well without immediate post procedural complication. FINDINGS: A total of approximately 6.9 liters of serous fluid was removed. Samples were sent to the laboratory as requested by the clinical team. IMPRESSION: Successful ultrasound-guided paracentesis yielding 6.9 liters of peritoneal fluid. Electronically Signed   By: Sandi Mariscal M.D.   On: 11/16/2017 11:55    PERFORMANCE STATUS (ECOG) : 2 - Symptomatic, <50% confined to bed  Review of Systems As noted above. Otherwise, a complete review of systems is negative.  Physical Exam General: frail appearing, thin Cardiovascular: regular rate and rhythm Pulmonary: clear ant fields Abdomen: distended, less distended, + fluid wave Extremities: no  edema Skin: no rashes Neurological: Weakness but otherwise nonfocal  IMPRESSION: Patient presents to the clinic today for follow-up.  He is status post paracentesis on 10/29 with 4.4 L of fluid withdrawn.  Patient reports the pain was markedly improved following the paracentesis.  Unfortunately, patient recognizes that the ascites will reaccumulate.  He is less distended today than he was earlier in the week.  However, he remains quite distended with positive fluid wave.  Patient normally fits in size 32 pants but is currently wearing size 36 pounds due to abdominal distention.  Future consideration could be given for insertion of a Tenckhoff catheter.  However patient probably would benefit first from management with diuretics.  Overall symptomatically, patient is improved.  He reports using less of the oxycodone and is needing only 1 tablet every 6 hours.  Case discussed with Dr. Janese Banks.  Patient is interested in seeking treatment for the cancer.  He recognizes that the cancer is not curative.  [facility] of hepatitis.  He was speak with social worker to try to arrange transportation.  I reviewed with patient advance directives and gave him documentation to complete healthcare power of attorney and a living will.  He thinks he would want his oldest son to be his healthcare decision-maker if needed but wants to speak with his son prior to making that decision.  We discussed CODE STATUS.  Patient says clearly that he would not want to be resuscitated or have his life prolonged artificially.  He cites the end of life care with 2 sisters, 1 of whom died of cancer.  Patient also tells me that his mother had a DNR order and he would want that for himself.   I completed a MOST form today. The patient and family outlined their wishes for the following treatment decisions:  Cardiopulmonary Resuscitation: Do Not Attempt Resuscitation (DNR/No CPR)   Medical Interventions: Limited Additional Interventions: Use medical treatment, IV fluids and cardiac monitoring as indicated, DO NOT USE intubation or mechanical ventilation. May consider use of less invasive airway support such as BiPAP or CPAP. Also provide comfort measures. Transfer to the hospital if indicated. Avoid intensive care.   Antibiotics: Antibiotics if indicated  IV Fluids: IV fluids if indicated  Feeding Tube: No feeding tube   PLAN: Refer to social work to assist with transportation Plan for follow-up with GI at academic medical center Recommend initiation of diuretics for management of ascites Will likely need repeat therapeutic paracentesis in the near future Consideration could be given for future Tenckhoff catheter insertion Continue oxycodone as needed for pain DNR MOST Form completed as outlined above    Patient expressed understanding and was in agreement with this plan. He also understands that He can call clinic at any time with any questions, concerns, or complaints.    Time Total: 30 minutes  Visit consisted of counseling and education dealing with the complex and emotionally intense issues of symptom management and palliative care in the setting of serious and potentially life-threatening illness.Greater than 50%  of this time was spent counseling and coordinating care related to the above assessment and plan.  Signed by: Altha Harm, [place], NP-C, [place] (Work Cell)

## 2017-11-26 NOTE — Telephone Encounter (Signed)
I spoke with Sharyn Lull, NP with GI. We discussed patient's complex management and recommendation of seeking care at an academic medical center. However, patient has verbalized an inability to go to such a facility due to transportation problems. In the interim, patient cannot be treated for his cancer due to active HCV/HBV. Additionally, patient has recurrent ascites that would benefit from GI management.  Sharyn Lull, NP, says that she will speak with Dr. Gustavo Lah to explore options for local management.  She will call me back.

## 2017-11-27 ENCOUNTER — Telehealth: Payer: Self-pay

## 2017-11-27 NOTE — Progress Notes (Signed)
Hematology/Oncology Consult note [facility]  Telephone:([phone] [phone]  Patient Care Team: Glendon Axe, MD as PCP - General (Internal Medicine) Wellington Hampshire, MD as PCP - Cardiology (Cardiology) Efrain Sella, MD as PCP - Gastroenterology (Gastroenterology) Clent Jacks, RN as Registered Nurse   Name of the patient: Phillip Warren  [id number]  February 15, 1955   Date of visit: 11/27/17  Diagnosis- locally advanced unresectable [facility]  Chief complaint/ Reason for visit- ongoing evaluation of abdominal pain/ ascites and HCC  Heme/Onc history: patient is a 62 year old male with who recently presented to the ER with symptoms of abdominal pain.  He has a known history of cirrhosis and hepatocellular carcinoma since 2016.  At that time he underwent IR guided ablation but since then has been lost to follow-up.  He also has history of active hepatitis B and hepatitis C but has not followed up with GI recently.  He was admitted to the hospital in September 2019 and at that point there was a concern for portal vein thrombosis for which she was started on Coumadin.  He also had MI possibly secondary to heroine overdose and his EF at that time was 25 to 30%.  During his recent ER admission on 10/22/2017 ultrasound of the abdomen showed main portal vein is distended and appears thrombosed.  Focal hepatic lesion in the right hepatic lobe measuring 5.2 x 3.3 x 5 cm.  MRI of the abdomen showed large poorly marginated mass replacing the right liver lobe measuring up to 15.9 x 11.3 cm.  Expansile occlusive thrombosis of the main right and left portal veins contiguous with this mass most compatible with tumor thrombus.  Moderate splenomegaly 18 cm.  Moderate volume ascites.  Nonspecific portocaval and retroperitoneal adenopathy cannot exclude metastatic disease.   Interval history- pain is better controlled with prn oxycodone 1-2 tab which he is using 4-5 doses  per day. Doe not report any significant constipation. He did have another paracentesis done last week. Appetite is fair  ECOG PS- 2 Pain scale- 6 Opioid associated constipation- no  Review of systems- Review of Systems  Constitutional: Positive for malaise/fatigue and weight loss. Negative for chills and fever.  HENT: Negative for congestion, ear discharge and nosebleeds.   Eyes: Negative for blurred vision.  Respiratory: Negative for cough, hemoptysis, sputum production, shortness of breath and wheezing.   Cardiovascular: Negative for chest pain, palpitations, orthopnea and claudication.  Gastrointestinal: Positive for abdominal pain. Negative for blood in stool, constipation, diarrhea, heartburn, melena, nausea and vomiting.       Abdominal distension  Genitourinary: Negative for dysuria, flank pain, frequency, hematuria and urgency.  Musculoskeletal: Negative for back pain, joint pain and myalgias.  Skin: Negative for rash.  Neurological: Negative for dizziness, tingling, focal weakness, seizures, weakness and headaches.  Endo/Heme/Allergies: Does not bruise/bleed easily.  Psychiatric/Behavioral: Negative for depression and suicidal ideas. The patient does not have insomnia.        Allergies  Allergen Reactions  . Multihance [Gadobenate] Hives and Shortness Of Breath  . Hydrocodone Itching and Nausea And Vomiting  . Other Itching    multihance MRI dye  . Vicodin [Hydrocodone-Acetaminophen] Nausea Only and Nausea And Vomiting    Vomiting      Past Medical History:  Diagnosis Date  . Alcohol abuse   . Anorexia    has trouble eating due to poor appetite  . Anxiety    trouble sleeping; lost two sisters and his mother close together  .  Arthritis    hands, feet (burning, stinging)  . Cancer [facility])    liver lesion initial staging.  . CHF (congestive heart failure) ([place])   . Cirrhosis of liver ([place])   . Cocaine abuse ([place])   . COPD (chronic obstructive pulmonary disease)  ([place])   . Coronary artery disease    Inferior ST elevation myocardial infarction in February 2019.  Cardiac catheterization showed subtotal occlusion of mid right coronary artery with severe disease affecting the LAD and left circumflex.  Successful PCI and 2 drug-eluting stent placement to the RCA.  He returned a few days after hospital discharge with stent thrombosis due to not taking any of his cardiac medications.   Marland Kitchen GERD (gastroesophageal reflux disease)   . H/O dizziness   . Headache   . Hepatitis    B and C (chronic)  . Marijuana use   . Myocardial infarction ([place])    massive MI at age 42  . Neuropathy   . Numbness and tingling    legs and feet bilat   . Pneumonia   . Repeated falls   . Shortness of breath dyspnea   . Tinnitus   . Tobacco abuse      Past Surgical History:  Procedure Laterality Date  . CARDIAC SURGERY    . COLONOSCOPY WITH PROPOFOL N/A 08/31/2014   Procedure: COLONOSCOPY WITH PROPOFOL;  Surgeon: Josefine Class, MD;  Location: [facility] ENDOSCOPY;  Service: Endoscopy;  Laterality: N/A;  . CORONARY ANGIOPLASTY     at age 42 secondary to massive MI  . CORONARY/GRAFT ACUTE MI REVASCULARIZATION N/A 03/09/2017   Procedure: Coronary/Graft Acute MI Revascularization;  Surgeon: Wellington Hampshire, MD;  Location: [place] CV LAB;  Service: Cardiovascular;  Laterality: N/A;  . CORONARY/GRAFT ACUTE MI REVASCULARIZATION N/A 03/16/2017   Procedure: Coronary/Graft Acute MI Revascularization;  Surgeon: Wellington Hampshire, MD;  Location: [place] CV LAB;  Service: Cardiovascular;  Laterality: N/A;  balloon only RCA  . ESOPHAGOGASTRODUODENOSCOPY (EGD) WITH PROPOFOL N/A 08/31/2014   Procedure: ESOPHAGOGASTRODUODENOSCOPY (EGD) WITH PROPOFOL;  Surgeon: Josefine Class, MD;  Location[facility] ENDOSCOPY;  Service: Endoscopy;  Laterality: N/A;  . ESOPHAGOGASTRODUODENOSCOPY (EGD) WITH PROPOFOL N/A 01/02/2017   Procedure: ESOPHAGOGASTRODUODENOSCOPY (EGD) WITH PROPOFOL;  Surgeon:  Toledo, Benay Pike, MD;  Location: [facility] ENDOSCOPY;  Service: Gastroenterology;  Laterality: N/A;  . LEFT HEART CATH AND CORONARY ANGIOGRAPHY N/A 03/09/2017   Procedure: LEFT HEART CATH AND CORONARY ANGIOGRAPHY;  Surgeon: Wellington Hampshire, MD;  Location: [place] CV LAB;  Service: Cardiovascular;  Laterality: N/A;  . LEFT HEART CATH AND CORONARY ANGIOGRAPHY N/A 03/16/2017   Procedure: LEFT HEART CATH AND CORONARY ANGIOGRAPHY;  Surgeon: Wellington Hampshire, MD;  Location: [place] CV LAB;  Service: Cardiovascular;  Laterality: N/A;    Social History   Socioeconomic History  . Marital status: Widowed    Spouse name: Not on file  . Number of children: Not on file  . Years of education: Not on file  . Highest education level: Not on file  Occupational History  . Not on file  Social Needs  . Financial resource strain: Not on file  . Food insecurity:    Worry: Not on file    Inability: Not on file  . Transportation needs:    Medical: Not on file    Non-medical: Not on file  Tobacco Use  . Smoking status: Current Every Day Smoker    Packs/day: 0.50    Years: 30.00    Pack years:  15.00    Types: Cigarettes  . Smokeless tobacco: Never Used  Substance and Sexual Activity  . Alcohol use: Yes    Frequency: Never    Comment: "very little"  . Drug use: Yes    Types: Marijuana    Comment: pt states has not used cocaine in 20 years   . Sexual activity: Not on file  Lifestyle  . Physical activity:    Days per week: Not on file    Minutes per session: Not on file  . Stress: Not on file  Relationships  . Social connections:    Talks on phone: Not on file    Gets together: Not on file    Attends religious service: Not on file    Active member of club or organization: Not on file    Attends meetings of clubs or organizations: Not on file    Relationship status: Not on file  . Intimate partner violence:    Fear of current or ex partner: Not on file    Emotionally abused: Not on  file    Physically abused: Not on file    Forced sexual activity: Not on file  Other Topics Concern  . Not on file  Social History Narrative  . Not on file    Family History  Problem Relation Age of Onset  . CAD Father   . Lung cancer Sister   . CAD Brother   . Heart disease Brother        CABG x 3  . Heart attack Brother        nine heart attacks  . Prostate cancer Neg Hx   . Bladder Cancer Neg Hx   . Kidney cancer Neg Hx      Current Outpatient Medications:  .  atorvastatin (LIPITOR) 10 MG tablet, Take 1 tablet (10 mg total) by mouth daily at 6 PM., Disp: 30 tablet, Rfl: 3 .  clopidogrel (PLAVIX) 75 MG tablet, Take 1 tablet (75 mg total) by mouth daily with breakfast., Disp: 30 tablet, Rfl: 5 .  gabapentin (NEURONTIN) 800 MG tablet, Take 800 mg by mouth 4 (four) times daily., Disp: , Rfl:  .  lactulose (CHRONULAC) 10 GM/15ML solution, Take 45 mLs (30 g total) by mouth 2 (two) times daily., Disp: 240 mL, Rfl: 0 .  lisinopril (PRINIVIL,ZESTRIL) 2.5 MG tablet, Take 1 tablet (2.5 mg total) by mouth daily., Disp: 30 tablet, Rfl: 5 .  metoprolol succinate (TOPROL-XL) 25 MG 24 hr tablet, Take 0.5 tablets (12.5 mg total) by mouth daily., Disp: 30 tablet, Rfl: 6 .  Oxycodone HCl 10 MG TABS, Take 1 tablet (10 mg total) by mouth every 4 (four) hours as needed., Disp: 20 tablet, Rfl: 0 .  pantoprazole (PROTONIX) 40 MG tablet, Take 1 tablet (40 mg total) by mouth daily., Disp: 30 tablet, Rfl: 11 .  warfarin (COUMADIN) 2.5 MG tablet, Take 1 tablet (2.5 mg total) by mouth daily., Disp: 30 tablet, Rfl: 0 .  nicotine (NICODERM CQ - DOSED IN MG/24 HOURS) 21 mg/24hr patch, Place 1 patch (21 mg total) onto the skin daily. (Patient not taking: Reported on 11/13/2017), Disp: 28 patch, Rfl: 0 .  nitroGLYCERIN (NITROSTAT) 0.4 MG SL tablet, Place 0.4 mg under the tongue every 5 (five) minutes as needed for chest pain. , Disp: , Rfl:  .  ondansetron (ZOFRAN ODT) 4 MG disintegrating tablet, Take 1 tablet  (4 mg total) by mouth every 8 (eight) hours as needed for nausea or vomiting. (Patient  not taking: Reported on 11/26/2017), Disp: 20 tablet, Rfl: 0 .  ondansetron (ZOFRAN) 4 MG tablet, Take 1 tablet (4 mg total) by mouth every 4 (four) hours as needed for nausea. (Patient not taking: Reported on 11/13/2017), Disp: 20 tablet, Rfl: 0  Physical exam:  Vitals:   11/26/17 1440  BP: 119/75  Pulse: (!) 107  Resp: 18  Temp: 97.7 F (36.5 C)  TempSrc: Tympanic  SpO2: 97%  Weight: 149 lb 9.6 oz (67.9 kg)  Height: 6' (1.829 m)   Physical Exam  Constitutional: He is oriented to person, place, and time.  Patient is thin and cachectic. Does not appear to be in acute distress  HENT:  Head: Normocephalic and atraumatic.  Eyes: Pupils are equal, round, and reactive to light. EOM are normal.  Neck: Normal range of motion.  Cardiovascular: Normal rate, regular rhythm and normal heart sounds.  Pulmonary/Chest: Effort normal and breath sounds normal.  Abdominal:  Distended. Ascites +  Neurological: He is alert and oriented to person, place, and time.  Skin: Skin is warm and dry.     CMP Latest Ref Rng & Units 11/23/2017  Glucose 70 - 99 mg/dL 128(H)  BUN 6 - 20 mg/dL 17  Creatinine 0.61 - 1.24 mg/dL 1.05  Sodium 135 - 145 mmol/L 130(L)  Potassium 3.5 - 5.1 mmol/L 4.4  Chloride 98 - 111 mmol/L 98  CO2 22 - 32 mmol/L 24  Calcium 8.9 - 10.3 mg/dL 9.7  Total Protein 6.5 - 8.1 g/dL 9.1(H)  Total Bilirubin 0.3 - 1.2 mg/dL 3.0(H)  Alkaline Phos 38 - 126 U/L 190(H)  AST 15 - 41 U/L 231(H)  ALT 0 - 44 U/L 42   CBC Latest Ref Rng & Units 11/23/2017  WBC 4.0 - 10.5 K/uL 7.6  Hemoglobin 13.0 - 17.0 g/dL 11.8(L)  Hematocrit 39.0 - 52.0 % 35.2(L)  Platelets 150 - 400 K/uL 145(L)    No images are attached to the encounter.  Ct Chest Wo Contrast  Result Date: 11/23/2017 CLINICAL DATA:  Recurrent hepatocellular carcinoma. Staging. Chest pain and shortness of breath. Recent cardiac stent. Smoker.  EXAM: CT CHEST WITHOUT CONTRAST TECHNIQUE: Multidetector CT imaging of the chest was performed following the standard protocol without IV contrast. COMPARISON:  Abdominal MRI 10/22/2017. Chest radiograph 10/22/2017. Most recent chest CT 08/30/2014. FINDINGS: Cardiovascular: Aortic and branch vessel atherosclerosis. Normal heart size, without pericardial effusion. Multivessel coronary artery atherosclerosis. Mediastinum/Nodes: No supraclavicular adenopathy. No mediastinal or definite hilar adenopathy, given limitations of unenhanced CT. Lungs/Pleura: No pleural fluid. Lower lobe predominant bronchial wall thickening. Minimal motion degradation in the lower chest. Mild centrilobular and paraseptal emphysema. Bibasilar scarring or subsegmental atelectasis. Interstitial thickening in the posterior left upper lobe with adjacent left major fissure thickening, similar including on image 53/3 indicative of scarring. Minimal biapical pleuroparenchymal scarring. Suspect a 4 mm right middle lobe pulmonary nodule on image 87/3. Not readily apparent on the prior exam, possibly due to thinner slice collimation today. Upper Abdomen: Cirrhosis. Abdominal ascites. Suboptimal visualization of previously described dominant hepatic mass, secondary to noncontrast technique. Grossly normal imaged portions of the adrenal glands, left kidney, spleen, stomach. Musculoskeletal: No acute osseous abnormality. Mild right hemidiaphragm elevation. IMPRESSION: 1.  No acute process or evidence of metastatic disease in the chest. 2. Right middle lobe isolated 4 mm nodule, not readily apparent on the prior. Recommend attention on follow-up. 3. Aortic atherosclerosis (ICD10-I70.0), coronary artery atherosclerosis and emphysema (ICD10-J43.9). Electronically Signed   By: Adria Devon.D.  On: 11/23/2017 16:06   US Paracentesis  Result Date: 11/24/2017 INDICATION: Ascites EXAM: ULTRASOUND GUIDED  PARACENTESIS MEDICATIONS: None. COMPLICATIONS: None  immediate. PROCEDURE: Informed written consent was obtained from the patient after a discussion of the risks, benefits and alternatives to treatment. A timeout was performed prior to the initiation of the procedure. Initial ultrasound scanning demonstrates a large amount of ascites within the right lower abdominal quadrant. The right lower abdomen was prepped and draped in the usual sterile fashion. 1% lidocaine with epinephrine was used for local anesthesia. Following this, a 6 Fr Safe-T-Centesis catheter was introduced. An ultrasound image was saved for documentation purposes. The paracentesis was performed. The catheter was removed and a dressing was applied. The patient tolerated the procedure well without immediate post procedural complication. FINDINGS: A total of approximately 4.4 L of clear yellow fluid was removed. IMPRESSION: Successful ultrasound-guided paracentesis yielding 4.4 liters of peritoneal fluid. Electronically Signed   By: Marybelle Killings M.D.   On: 11/24/2017 14:28   US Paracentesis  Result Date: 11/16/2017 INDICATION: History infiltrative hepatocellular carcinoma and malignant portal vein thrombosis, now with symptomatic intra-abdominal ascites. Please perform ultrasound-guided paracentesis for both therapeutic and diagnostic purposes. EXAM: ULTRASOUND-GUIDED PARACENTESIS COMPARISON:  MRCP-10/22/2017 MEDICATIONS: None. COMPLICATIONS: None immediate. TECHNIQUE: Informed written consent was obtained from the patient after a discussion of the risks, benefits and alternatives to treatment. A timeout was performed prior to the initiation of the procedure. Initial ultrasound scanning demonstrates a moderate to large amount of ascites within the right lower abdominal quadrant. The right lower abdomen was prepped and draped in the usual sterile fashion. 1% lidocaine with epinephrine was used for local anesthesia. An ultrasound image was saved for documentation purposed. An 8 Fr Safe-T-Centesis  catheter was introduced. The paracentesis was performed. The catheter was removed and a dressing was applied. The patient tolerated the procedure well without immediate post procedural complication. FINDINGS: A total of approximately 6.9 liters of serous fluid was removed. Samples were sent to the laboratory as requested by the clinical team. IMPRESSION: Successful ultrasound-guided paracentesis yielding 6.9 liters of peritoneal fluid. Electronically Signed   By: Sandi Mariscal M.D.   On: 11/16/2017 11:55     Assessment and plan- Patient is a 62 y.o. male locally advanced hepatocellular carcinoma in the setting of cirrhosis from hepatitis B and hepatitis C  1.  [facility] is unable to manage patient's underlying decompensated cirrhosis in the setting of hepatitis B and hepatitis C and they recommend that he should be seen at a tertiary center at [facility].  Patient has seen Dr. Lyndel Safe from [facility] in the past.  His main issue of going there is his transportation issue.  He is however agreeable at this time to go for a morning appointment as his daughter can potentially take him and if he is unable to do so we will try to find alternative transportation for him.  2.  Unless I get recommendations from GI regarding management of his hepatitis B and hepatitis C I am unable to proceed with [facility] treatment at this time.  He is a child Pugh B and could potentially benefit from sorafenib at 200 mg twice daily but I will wait on GI recommendations at this time before proceeding.  3. Recent paracentesis did not reveal any SBP. He will also need diuretic management with GI for his ascites  4. Continue prn oxycodone for pain  I will see him back in 2 weeks with labs    Visit  Diagnosis 1. Cancer, hepatocellular ([place])   2. Cirrhosis of liver with ascites, unspecified hepatic cirrhosis type ([place])      Dr. Randa Evens, MD, MPH [facility] [phone] 11/27/2017 2:31  PM

## 2017-11-27 NOTE — Telephone Encounter (Signed)
Called and spoke with [facility] hepatology. Mr Phillip Warren needs to see Dr. Charm Warren. Mr Phillip Warren requested an am appointment for easier transportation. They do not have any available appointments until February. Mr. Phillip Warren wanted this appointment. Educated that he needs to go see them soon. I have made him an appointment to see Dr. Posey Warren at his first available, which is 12/10/17 at 1530. I have given him this appointment as well as the address to the GI office. [address] in [place]. He is going to see if his daughter can take him. He was instrucuted to let me know if she cannot and I will attempt alternate transportation. He did not want his appointments here on 11/14 rescheduled until he decides for sure he is going to [facility]. Oncology Nurse Navigator Documentation  Navigator Location: [facility] (11/27/17 0900)   )Navigator Encounter Type: Telephone (11/27/17 0900) Telephone: Lahoma Crocker Call;Appt Confirmation/Clarification (11/27/17 0900)                                                  Time Spent with Patient: 15 (11/27/17 0900)

## 2017-11-30 ENCOUNTER — Other Ambulatory Visit: Payer: Self-pay | Admitting: Oncology

## 2017-11-30 ENCOUNTER — Other Ambulatory Visit: Payer: Self-pay | Admitting: *Deleted

## 2017-11-30 MED ORDER — OXYCODONE HCL 10 MG PO TABS: 10.0000 mg | ORAL_TABLET | Freq: Four times a day (QID) | ORAL | 0 refills | Status: DC | PRN

## 2017-11-30 NOTE — Progress Notes (Signed)
o

## 2017-11-30 NOTE — Telephone Encounter (Signed)
Patient called and reports that he is swelling in his abdominal again and is having pain, but not to point he needs it drawn off yet. It is causing increased pain and he is out of medicine

## 2017-12-03 ENCOUNTER — Ambulatory Visit: Payer: Medicare Other | Admitting: Cardiovascular Disease

## 2017-12-03 NOTE — Progress Notes (Deleted)
Cardiology Office Note   Date:  12/03/2017   ID:  Phillip Warren, DOB 07/27/1955, MRN [id number]  PCP:  Glendon Axe, MD  Cardiologist:   Kathlyn Sacramento, MD   No chief complaint on file.     History of Present Illness: Phillip Warren is a 62 y.o. male who presents for follow-up visit regarding coronary artery disease. He has known history of liver cirrhosis with prior GI bleed, prior alcohol abuse, chronic anorexia, anxiety, severe peripheral neuropathy, previous liver cancer which was treated, previous cocaine use, COPD with tobacco use and hepatitis C. He had inferior ST elevation myocardial infarction in February of this year.   Emergent cardiac catheterization showed subtotal occlusion of the mid right coronary artery with heavy thrombus burden and heavily calcified vessels.  There was also severe disease affecting the LAD and ostial left circumflex.  I performed successful angioplasty and 2 overlapped drug-eluting stent placement to the RCA.  The patient returned with acute stent thrombosis few days after hospital discharge due to not taking any of his cardiac medications including aspirin and Plavix.  He was found to have an occluded distal stent which was treated successfully with balloon angioplasty. Echocardiogram showed an EF of 25-30% with severe hypokinesis of anteroseptal, anterior and apical myocardium.  There was also severe hypokinesis of the inferior and inferoseptal myocardium.  There was moderate to severe mitral regurgitation.  Surprisingly, the patient has actually been doing very well with no reported chest pain or significant dyspnea.  He is now compliant with all his medications.  He continues to smoke but reports no recent drug use.    Past Medical History:  Diagnosis Date  . Alcohol abuse   . Anorexia    has trouble eating due to poor appetite  . Anxiety    trouble sleeping; lost two sisters and his mother close together  . Arthritis    hands, feet  (burning, stinging)  . [facility])    liver lesion initial staging.  . CHF (congestive heart failure) ([place])   . Cirrhosis of liver ([place])   . Cocaine abuse ([place])   . COPD (chronic obstructive pulmonary disease) ([place])   . Coronary artery disease    Inferior ST elevation myocardial infarction in February 2019.  Cardiac catheterization showed subtotal occlusion of mid right coronary artery with severe disease affecting the LAD and left circumflex.  Successful PCI and 2 drug-eluting stent placement to the RCA.  He returned a few days after hospital discharge with stent thrombosis due to not taking any of his cardiac medications.   Marland Kitchen GERD (gastroesophageal reflux disease)   . H/O dizziness   . Headache   . Hepatitis    B and C (chronic)  . Marijuana use   . Myocardial infarction ([place])    massive MI at age 33  . Neuropathy   . Numbness and tingling    legs and feet bilat   . Pneumonia   . Repeated falls   . Shortness of breath dyspnea   . Tinnitus   . Tobacco abuse     Past Surgical History:  Procedure Laterality Date  . CARDIAC SURGERY    . COLONOSCOPY WITH PROPOFOL N/A 08/31/2014   Procedure: COLONOSCOPY WITH PROPOFOL;  Surgeon: Josefine Class, MD;  Location: [facility] ENDOSCOPY;  Service: Endoscopy;  Laterality: N/A;  . CORONARY ANGIOPLASTY     at age 73 secondary to massive MI  . CORONARY/GRAFT ACUTE MI REVASCULARIZATION N/A 03/09/2017   Procedure:  Coronary/Graft Acute MI Revascularization;  Surgeon: Wellington Hampshire, MD;  Location: [place] CV LAB;  Service: Cardiovascular;  Laterality: N/A;  . CORONARY/GRAFT ACUTE MI REVASCULARIZATION N/A 03/16/2017   Procedure: Coronary/Graft Acute MI Revascularization;  Surgeon: Wellington Hampshire, MD;  Location: [place] CV LAB;  Service: Cardiovascular;  Laterality: N/A;  balloon only RCA  . ESOPHAGOGASTRODUODENOSCOPY (EGD) WITH PROPOFOL N/A 08/31/2014   Procedure: ESOPHAGOGASTRODUODENOSCOPY (EGD) WITH PROPOFOL;  Surgeon: Josefine Class, MD;  Location: [facility] ENDOSCOPY;  Service: Endoscopy;  Laterality: N/A;  . ESOPHAGOGASTRODUODENOSCOPY (EGD) WITH PROPOFOL N/A 01/02/2017   Procedure: ESOPHAGOGASTRODUODENOSCOPY (EGD) WITH PROPOFOL;  Surgeon: Toledo, Benay Pike, MD;  Location: [facility] ENDOSCOPY;  Service: Gastroenterology;  Laterality: N/A;  . LEFT HEART CATH AND CORONARY ANGIOGRAPHY N/A 03/09/2017   Procedure: LEFT HEART CATH AND CORONARY ANGIOGRAPHY;  Surgeon: Wellington Hampshire, MD;  Location: [place] CV LAB;  Service: Cardiovascular;  Laterality: N/A;  . LEFT HEART CATH AND CORONARY ANGIOGRAPHY N/A 03/16/2017   Procedure: LEFT HEART CATH AND CORONARY ANGIOGRAPHY;  Surgeon: Wellington Hampshire, MD;  Location: [place] CV LAB;  Service: Cardiovascular;  Laterality: N/A;     Current Outpatient Medications  Medication Sig Dispense Refill  . atorvastatin (LIPITOR) 10 MG tablet Take 1 tablet (10 mg total) by mouth daily at 6 PM. 30 tablet 3  . clopidogrel (PLAVIX) 75 MG tablet Take 1 tablet (75 mg total) by mouth daily with breakfast. 30 tablet 5  . gabapentin (NEURONTIN) 800 MG tablet Take 800 mg by mouth 4 (four) times daily.    Marland Kitchen lactulose (CHRONULAC) 10 GM/15ML solution Take 45 mLs (30 g total) by mouth 2 (two) times daily. 240 mL 0  . lisinopril (PRINIVIL,ZESTRIL) 2.5 MG tablet Take 1 tablet (2.5 mg total) by mouth daily. 30 tablet 5  . metoprolol succinate (TOPROL-XL) 25 MG 24 hr tablet Take 0.5 tablets (12.5 mg total) by mouth daily. 30 tablet 6  . nicotine (NICODERM CQ - DOSED IN MG/24 HOURS) 21 mg/24hr patch Place 1 patch (21 mg total) onto the skin daily. (Patient not taking: Reported on 11/13/2017) 28 patch 0  . nitroGLYCERIN (NITROSTAT) 0.4 MG SL tablet Place 0.4 mg under the tongue every 5 (five) minutes as needed for chest pain.     Marland Kitchen ondansetron (ZOFRAN ODT) 4 MG disintegrating tablet Take 1 tablet (4 mg total) by mouth every 8 (eight) hours as needed for nausea or vomiting. (Patient not taking: Reported on  11/26/2017) 20 tablet 0  . ondansetron (ZOFRAN) 4 MG tablet Take 1 tablet (4 mg total) by mouth every 4 (four) hours as needed for nausea. (Patient not taking: Reported on 11/13/2017) 20 tablet 0  . Oxycodone HCl 10 MG TABS Take 1 tablet (10 mg total) by mouth every 6 (six) hours as needed for up to 14 days. 56 tablet 0  . pantoprazole (PROTONIX) 40 MG tablet Take 1 tablet (40 mg total) by mouth daily. 30 tablet 11  . warfarin (COUMADIN) 2.5 MG tablet Take 1 tablet (2.5 mg total) by mouth daily. 30 tablet 0   No current facility-administered medications for this visit.     Allergies:   Multihance [gadobenate]; Hydrocodone; Other; and Vicodin [hydrocodone-acetaminophen]    Social History:  The patient  reports that he has been smoking cigarettes. He has a 15.00 pack-year smoking history. He has never used smokeless tobacco. He reports that he drinks alcohol. He reports that he has current or past drug history. Drug: Marijuana.   Family History:  The patient's family history includes CAD in his brother and father; Heart attack in his brother; Heart disease in his brother; Lung cancer in his sister.    ROS:  Please see the history of present illness.   Otherwise, review of systems are positive for none.   All other systems are reviewed and negative.    PHYSICAL EXAM: VS:  There were no vitals taken for this visit. , BMI There is no height or weight on file to calculate BMI. GEN: Well nourished, well developed, in no acute distress  HEENT: normal  Neck: no JVD, carotid bruits, or masses Cardiac: RRR; no murmurs, rubs, or gallops,no edema  Respiratory:  clear to auscultation bilaterally, normal work of breathing GI: soft, nontender, nondistended, + BS MS: no deformity or atrophy  Skin: warm and dry, no rash Neuro:  Strength and sensation are intact Psych: euthymic mood, full affect  EKG:  EKG is ordered today. The ekg ordered today demonstrates normal sinus rhythm with incomplete right  bundle branch block.  Possible old septal infarct.  Recent Labs: 11/13/2017: TSH 10.741 11/23/2017: ALT 42; BUN 17; Creatinine, Ser 1.05; Hemoglobin 11.8; Platelets 145; Potassium 4.4; Sodium 130    Lipid Panel    Component Value Date/Time   CHOL 147 07/24/2017 1203   TRIG 46 07/24/2017 1203   HDL 38 (L) 07/24/2017 1203   CHOLHDL 3.9 07/24/2017 1203   CHOLHDL 5.3 03/09/2017 1900   VLDL 21 03/09/2017 1900   LDLCALC 100 (H) 07/24/2017 1203      Wt Readings from Last 3 Encounters:  11/26/17 149 lb 9.6 oz (67.9 kg)  11/23/17 154 lb 11.2 oz (70.2 kg)  11/13/17 161 lb 8 oz (73.3 kg)       No flowsheet data found.    ASSESSMENT AND PLAN:  1.  Coronary artery disease involving native coronary arteries without angina: He is doing well overall with no anginal symptoms.  I recommend continuing lifelong dual antiplatelet therapy if tolerated.   He continues to have borderline significant disease affecting the LAD and ostial left circumflex.  Both vessels are heavily calcified.   Given his comorbidities and the lack of anginal symptoms, I recommend continued medical therapy for now.  Revascularization can be considered for symptoms.  2.  Chronic systolic heart failure: He appears to be euvolemic.  Continue treatment with Toprol and lisinopril.  Not able to increase the dose due to low blood pressure.  I will consider repeat echocardiogram in the next 6 months.  3.  Hyperlipidemia: Continue treatment with atorvastatin.  I requested lipid and liver profile.  4.  Tobacco use: I discussed with him the importance of smoking cessation.    Disposition:   FU with me in 4 months  Signed,  Kathlyn Sacramento, MD  12/03/2017 3:02 PM    [facility]

## 2017-12-04 ENCOUNTER — Encounter: Payer: Self-pay | Admitting: Cardiovascular Disease

## 2017-12-10 ENCOUNTER — Inpatient Hospital Stay: Payer: Medicare Other | Admitting: Hospice and Palliative Medicine

## 2017-12-10 ENCOUNTER — Inpatient Hospital Stay: Payer: Medicare Other

## 2017-12-10 ENCOUNTER — Inpatient Hospital Stay: Payer: Medicare Other | Admitting: Oncology

## 2017-12-11 ENCOUNTER — Telehealth: Payer: Self-pay | Admitting: *Deleted

## 2017-12-11 ENCOUNTER — Other Ambulatory Visit: Payer: Self-pay | Admitting: *Deleted

## 2017-12-11 ENCOUNTER — Other Ambulatory Visit: Payer: Self-pay | Admitting: Nurse Practitioner

## 2017-12-11 ENCOUNTER — Other Ambulatory Visit: Payer: Self-pay | Admitting: Oncology

## 2017-12-11 MED ORDER — OXYCODONE HCL 10 MG PO TABS: 10.0000 mg | ORAL_TABLET | Freq: Four times a day (QID) | ORAL | 0 refills | Status: DC | PRN

## 2017-12-11 MED ORDER — OXYCODONE HCL 10 MG PO TABS: 10.0000 mg | ORAL_TABLET | Freq: Four times a day (QID) | ORAL | 0 refills | Status: AC | PRN

## 2017-12-11 NOTE — Progress Notes (Signed)
Per Dr. Janese Banks, prescription for 3 day supply of oxycodone sent to pharmacy for patient to fill today.

## 2017-12-11 NOTE — Telephone Encounter (Signed)
Patient called Phillip Warren requesting refill of Oxycodone.   As mandated by the [place] STOP Act (Strengthen Opioid Misuse Prevention), the [employer] Controlled Substance Reporting System ([place]) was reviewed for this patient.  Below is the past 29-months of controlled substance prescriptions as displayed by the registry.    Per current prescribed dose, refill not appropriate until 12/14/17. Will send script for first fill on 12/14/17.   As supervising physician, Dr. Janese Banks, agrees that continuation of opiate therapy is medically appropriate at this time and agrees to provide continual monitoring, including urine/blood drug screens, as indicated. Prescription sent electronically using [employer] secure transmission to requested pharmacy.   [place] Reviewed:     Beckey Rutter, DNP, AGNP-C [place] [facility] [phone] (work cell) [phone] (office) 12/11/17 3:23 PM

## 2017-12-11 NOTE — Telephone Encounter (Signed)
Patient states he is going to be out of his Oxycodone before Monday when it is due to be refilled. He admits he has been taking 5 tabs per day and it is ordered for every 6 hours  as needed. Please advise

## 2017-12-11 NOTE — Telephone Encounter (Signed)
Call from [employer] that she is taking him to the ER for pain and swelling of the extremities. I spoke with her regarding his pain medicine and the fact that he is taking more than prescribed and that we sent in a 3 day supply for the weekend. I also advised that he did not show up for his appointments yesterday and she has requested that she be contacted regarding his appointments as she will be keeping up with them to get him to them from now on. I transferred her to scheduling.

## 2017-12-11 NOTE — Telephone Encounter (Signed)
I got a call from 1 of the nurses that she had gotten a call from the pharmacy there is a problem with the patient's pain medicine.  I called the pharmacy spoke to Phillip Warren she states that the prescription they have they can use but they cannot go through his insurance because Phillip Warren nurse practitioner is not contracted with his insurance.  They are more than willing to give the patient the medicine they would just have to pay cash for it.  I told the pharmacist that I will check with Phillip Warren and see if she will just go ahead and send a new prescription so that it could be ran through his insurance instead of him paying out of his pocket.  I told Phillip Warren I would call her back.  3 minutes later I have already called Phillip Warren she is sent a prescription in and I called Phillip Warren back at the pharmacy.  Now Phillip Warren is telling me that the daughter and patient came by the drive-through and picked up the 12 pills and pay the cash price of $7.  They did tell the patient that they cannot pick up any more narcotics until Monday.  So the patient has 12 pills that he can take 4 pills each days and then come back on Monday and get a refill.  Phillip Warren has already sent a refill in the pharmacy knows that it cannot be filled before Monday.  I asked Phillip Warren about running it through the insurance and she states that it was 2 days early in the insurance would probably not have paid for so the aids they had would have to pay cash.  Daughter told them at the window that they were going to the ER because the pharmacy would not give the medicine.  They tried to explain that they can give him the medicine but they would just have to pay cash for it.

## 2017-12-11 NOTE — Telephone Encounter (Signed)
Per pharmacy, the Oxycodone script can not be prescribed by Ander Purpura, NP. Needs new script due to insurance purposes for patient to rcv script.

## 2017-12-13 ENCOUNTER — Other Ambulatory Visit: Payer: Self-pay

## 2017-12-13 ENCOUNTER — Emergency Department: Payer: Medicare Other

## 2017-12-13 ENCOUNTER — Inpatient Hospital Stay
Admission: EM | Admit: 2017-12-13 | Discharge: 2017-12-17 | DRG: 871 | Discharge status: Against Medical Advice | Payer: Medicare Other | Attending: Specialist | Admitting: Specialist

## 2017-12-13 ENCOUNTER — Encounter: Payer: Self-pay | Admitting: Emergency Medicine

## 2017-12-13 DIAGNOSIS — K219 Gastro-esophageal reflux disease without esophagitis: Secondary | ICD-10-CM | POA: Diagnosis present

## 2017-12-13 DIAGNOSIS — R7881 Bacteremia: Secondary | ICD-10-CM | POA: Diagnosis not present

## 2017-12-13 DIAGNOSIS — J449 Chronic obstructive pulmonary disease, unspecified: Secondary | ICD-10-CM | POA: Diagnosis present

## 2017-12-13 DIAGNOSIS — I5022 Chronic systolic (congestive) heart failure: Secondary | ICD-10-CM | POA: Diagnosis present

## 2017-12-13 DIAGNOSIS — A403 Sepsis due to Streptococcus pneumoniae: Principal | ICD-10-CM | POA: Diagnosis present

## 2017-12-13 DIAGNOSIS — I81 Portal vein thrombosis: Secondary | ICD-10-CM | POA: Diagnosis present

## 2017-12-13 DIAGNOSIS — K7460 Unspecified cirrhosis of liver: Secondary | ICD-10-CM | POA: Diagnosis present

## 2017-12-13 DIAGNOSIS — B181 Chronic viral hepatitis B without delta-agent: Secondary | ICD-10-CM | POA: Diagnosis present

## 2017-12-13 DIAGNOSIS — B182 Chronic viral hepatitis C: Secondary | ICD-10-CM | POA: Diagnosis present

## 2017-12-13 DIAGNOSIS — C220 Liver cell carcinoma: Secondary | ICD-10-CM | POA: Diagnosis present

## 2017-12-13 DIAGNOSIS — I8510 Secondary esophageal varices without bleeding: Secondary | ICD-10-CM | POA: Diagnosis not present

## 2017-12-13 DIAGNOSIS — R188 Other ascites: Secondary | ICD-10-CM

## 2017-12-13 DIAGNOSIS — B1910 Unspecified viral hepatitis B without hepatic coma: Secondary | ICD-10-CM | POA: Diagnosis not present

## 2017-12-13 DIAGNOSIS — R180 Malignant ascites: Secondary | ICD-10-CM | POA: Diagnosis not present

## 2017-12-13 DIAGNOSIS — I820 Budd-Chiari syndrome: Secondary | ICD-10-CM | POA: Diagnosis present

## 2017-12-13 DIAGNOSIS — R64 Cachexia: Secondary | ICD-10-CM | POA: Diagnosis present

## 2017-12-13 DIAGNOSIS — E43 Unspecified severe protein-calorie malnutrition: Secondary | ICD-10-CM | POA: Diagnosis present

## 2017-12-13 DIAGNOSIS — Z955 Presence of coronary angioplasty implant and graft: Secondary | ICD-10-CM

## 2017-12-13 DIAGNOSIS — Z79899 Other long term (current) drug therapy: Secondary | ICD-10-CM

## 2017-12-13 DIAGNOSIS — Z888 Allergy status to other drugs, medicaments and biological substances status: Secondary | ICD-10-CM

## 2017-12-13 DIAGNOSIS — Z515 Encounter for palliative care: Secondary | ICD-10-CM | POA: Diagnosis not present

## 2017-12-13 DIAGNOSIS — I509 Heart failure, unspecified: Secondary | ICD-10-CM | POA: Diagnosis present

## 2017-12-13 DIAGNOSIS — R54 Age-related physical debility: Secondary | ICD-10-CM | POA: Diagnosis present

## 2017-12-13 DIAGNOSIS — I252 Old myocardial infarction: Secondary | ICD-10-CM | POA: Diagnosis not present

## 2017-12-13 DIAGNOSIS — Z66 Do not resuscitate: Secondary | ICD-10-CM | POA: Diagnosis present

## 2017-12-13 DIAGNOSIS — Z7189 Other specified counseling: Secondary | ICD-10-CM

## 2017-12-13 DIAGNOSIS — Z79891 Long term (current) use of opiate analgesic: Secondary | ICD-10-CM

## 2017-12-13 DIAGNOSIS — R1084 Generalized abdominal pain: Secondary | ICD-10-CM

## 2017-12-13 DIAGNOSIS — Z7902 Long term (current) use of antithrombotics/antiplatelets: Secondary | ICD-10-CM

## 2017-12-13 DIAGNOSIS — I2510 Atherosclerotic heart disease of native coronary artery without angina pectoris: Secondary | ICD-10-CM | POA: Diagnosis present

## 2017-12-13 DIAGNOSIS — F17200 Nicotine dependence, unspecified, uncomplicated: Secondary | ICD-10-CM | POA: Diagnosis present

## 2017-12-13 DIAGNOSIS — R600 Localized edema: Secondary | ICD-10-CM

## 2017-12-13 DIAGNOSIS — K652 Spontaneous bacterial peritonitis: Secondary | ICD-10-CM | POA: Diagnosis present

## 2017-12-13 DIAGNOSIS — B953 Streptococcus pneumoniae as the cause of diseases classified elsewhere: Secondary | ICD-10-CM | POA: Diagnosis not present

## 2017-12-13 DIAGNOSIS — Z6821 Body mass index (BMI) 21.0-21.9, adult: Secondary | ICD-10-CM

## 2017-12-13 DIAGNOSIS — Z8505 Personal history of malignant neoplasm of liver: Secondary | ICD-10-CM | POA: Diagnosis not present

## 2017-12-13 DIAGNOSIS — B1920 Unspecified viral hepatitis C without hepatic coma: Secondary | ICD-10-CM | POA: Diagnosis not present

## 2017-12-13 DIAGNOSIS — G629 Polyneuropathy, unspecified: Secondary | ICD-10-CM | POA: Diagnosis present

## 2017-12-13 DIAGNOSIS — R140 Abdominal distension (gaseous): Secondary | ICD-10-CM | POA: Diagnosis present

## 2017-12-13 DIAGNOSIS — F17210 Nicotine dependence, cigarettes, uncomplicated: Secondary | ICD-10-CM | POA: Diagnosis not present

## 2017-12-13 DIAGNOSIS — Z885 Allergy status to narcotic agent status: Secondary | ICD-10-CM

## 2017-12-13 DIAGNOSIS — I9589 Other hypotension: Secondary | ICD-10-CM | POA: Diagnosis present

## 2017-12-13 DIAGNOSIS — R7989 Other specified abnormal findings of blood chemistry: Secondary | ICD-10-CM

## 2017-12-13 DIAGNOSIS — Z7984 Long term (current) use of oral hypoglycemic drugs: Secondary | ICD-10-CM

## 2017-12-13 DIAGNOSIS — A419 Sepsis, unspecified organism: Secondary | ICD-10-CM | POA: Diagnosis present

## 2017-12-13 LAB — CBC WITH DIFFERENTIAL/PLATELET
Abs Immature Granulocytes: 0.12 10*3/uL — ABNORMAL HIGH (ref 0.00–0.07)
Basophils Absolute: 0.1 10*3/uL (ref 0.0–0.1)
Basophils Relative: 0 %
EOS ABS: 0 10*3/uL (ref 0.0–0.5)
EOS PCT: 0 %
HEMATOCRIT: 37.7 % — AB (ref 39.0–52.0)
Hemoglobin: 12.2 g/dL — ABNORMAL LOW (ref 13.0–17.0)
Immature Granulocytes: 1 %
LYMPHS ABS: 1.3 10*3/uL (ref 0.7–4.0)
LYMPHS PCT: 7 %
MCH: 27 pg (ref 26.0–34.0)
MCHC: 32.4 g/dL (ref 30.0–36.0)
MCV: 83.4 fL (ref 80.0–100.0)
MONO ABS: 0.8 10*3/uL (ref 0.1–1.0)
Monocytes Relative: 4 %
NRBC: 0 % (ref 0.0–0.2)
Neutro Abs: 15 10*3/uL — ABNORMAL HIGH (ref 1.7–7.7)
Neutrophils Relative %: 88 %
Platelets: 159 10*3/uL (ref 150–400)
RBC: 4.52 MIL/uL (ref 4.22–5.81)
RDW: 24.5 % — ABNORMAL HIGH (ref 11.5–15.5)
WBC: 17.3 10*3/uL — AB (ref 4.0–10.5)

## 2017-12-13 LAB — CG4 I-STAT (LACTIC ACID)
LACTIC ACID, VENOUS: 4.74 mmol/L — AB (ref 0.5–1.9)
Lactic Acid, Venous: 6.56 mmol/L (ref 0.5–1.9)

## 2017-12-13 LAB — COMPREHENSIVE METABOLIC PANEL
ALK PHOS: 175 U/L — AB (ref 38–126)
ALT: 36 U/L (ref 0–44)
AST: 190 U/L — AB (ref 15–41)
Albumin: 2.6 g/dL — ABNORMAL LOW (ref 3.5–5.0)
Anion gap: 15 (ref 5–15)
BUN: 28 mg/dL — AB (ref 6–20)
CALCIUM: 9.9 mg/dL (ref 8.9–10.3)
CHLORIDE: 97 mmol/L — AB (ref 98–111)
CO2: 20 mmol/L — AB (ref 22–32)
CREATININE: 1.35 mg/dL — AB (ref 0.61–1.24)
GFR, EST NON AFRICAN AMERICAN: 56 mL/min — AB (ref >60)
Glucose, Bld: 168 mg/dL — ABNORMAL HIGH (ref 70–99)
Potassium: 4.9 mmol/L (ref 3.5–5.1)
Sodium: 132 mmol/L — ABNORMAL LOW (ref 135–145)
Total Bilirubin: 9 mg/dL — ABNORMAL HIGH (ref 0.3–1.2)
Total Protein: 8.1 g/dL (ref 6.5–8.1)

## 2017-12-13 LAB — LACTIC ACID, PLASMA: LACTIC ACID, VENOUS: 6.3 mmol/L — AB (ref 0.5–1.9)

## 2017-12-13 LAB — PROTIME-INR
INR: 1.73
Prothrombin Time: 20 seconds — ABNORMAL HIGH (ref 11.4–15.2)

## 2017-12-13 MED ORDER — OXYCODONE HCL 5 MG PO TABS
10.0000 mg | ORAL_TABLET | Freq: Four times a day (QID) | ORAL | Status: DC | PRN
  Administered 2017-12-14 – 2017-12-17 (×12): 10 mg via ORAL
  Filled 2017-12-13 (×12): qty 2

## 2017-12-13 MED ORDER — LIDOCAINE-EPINEPHRINE 2 %-1:100000 IJ SOLN
INTRAMUSCULAR | Status: AC
  Administered 2017-12-13: 20 mL via INTRADERMAL
  Filled 2017-12-13: qty 1

## 2017-12-13 MED ORDER — SODIUM CHLORIDE 0.9 % IV BOLUS
1000.0000 mL | Freq: Once | INTRAVENOUS | Status: AC
  Administered 2017-12-13: 1000 mL via INTRAVENOUS

## 2017-12-13 MED ORDER — SODIUM CHLORIDE 0.9 % IV SOLN
2.0000 g | Freq: Once | INTRAVENOUS | Status: AC
  Administered 2017-12-13: 2 g via INTRAVENOUS
  Filled 2017-12-13: qty 2

## 2017-12-13 MED ORDER — VANCOMYCIN HCL IN DEXTROSE 1-5 GM/200ML-% IV SOLN
1000.0000 mg | Freq: Once | INTRAVENOUS | Status: AC
  Administered 2017-12-13: 1000 mg via INTRAVENOUS
  Filled 2017-12-13: qty 200

## 2017-12-13 MED ORDER — LIDOCAINE-EPINEPHRINE 2 %-1:100000 IJ SOLN
20.0000 mL | Freq: Once | INTRAMUSCULAR | Status: AC
  Administered 2017-12-13: 20 mL via INTRADERMAL

## 2017-12-13 NOTE — ED Triage Notes (Signed)
Pt arrives POV to triage with c/o leg swelling x 3-4 days and abdominal pain. Pt is a liver cancer pt and has +3 edema bilaterally in both feet at this time.

## 2017-12-13 NOTE — ED Notes (Signed)
Date and time results received: 12/13/17 11:12 PM (use smartphrase ".now" to insert current time)  Test: Lactic acid Critical Value: 4.74  Name of Provider Notified: Dr. Archie Balboa  Orders Received? Or Actions Taken?: No new orders at this time.

## 2017-12-13 NOTE — ED Provider Notes (Signed)
[facility] Emergency Department Provider Note _____________________________________   I have reviewed the triage vital signs and the nursing notes.   HISTORY  Chief Complaint Abdominal Pain and Leg Swelling   History limited by: Not Limited   HPI Phillip Warren is a 62 y.o. male who presents to the emergency department today because of concerns for lower extremity swelling, abdominal swelling and discomfort.  Patient does have a history of liver cancer and frequently gets abdominal swelling.  He has had fluid drained off of his stomach in the past.  Family states however that when this happens just re-accumulates.  They state that the new symptom form is the leg swelling.  Is noticed bilateral leg edema.  This is been going on for the past 4 days.  Says been accompanied by back pain.  Furthermore he has felt hot to the family.  He does complain of some cough and shortness of breath.    Per medical record review patient has a history of cirrhosis, liver cancer, cad  Past Medical History:  Diagnosis Date  . Alcohol abuse   . Anorexia    has trouble eating due to poor appetite  . Anxiety    trouble sleeping; lost two sisters and his mother close together  . Arthritis    hands, feet (burning, stinging)  . Cancer [facility])    liver lesion initial staging.  . CHF (congestive heart failure) ([place])   . Cirrhosis of liver ([place])   . Cocaine abuse ([place])   . COPD (chronic obstructive pulmonary disease) ([place])   . Coronary artery disease    Inferior ST elevation myocardial infarction in February 2019.  Cardiac catheterization showed subtotal occlusion of mid right coronary artery with severe disease affecting the LAD and left circumflex.  Successful PCI and 2 drug-eluting stent placement to the RCA.  He returned a few days after hospital discharge with stent thrombosis due to not taking any of his cardiac medications.   Marland Kitchen GERD (gastroesophageal reflux disease)   . H/O  dizziness   . Headache   . Hepatitis    B and C (chronic)  . Marijuana use   . Myocardial infarction ([place])    massive MI at age 42  . Neuropathy   . Numbness and tingling    legs and feet bilat   . Pneumonia   . Repeated falls   . Shortness of breath dyspnea   . Tinnitus   . Tobacco abuse     Patient Active Problem List   Diagnosis Date Noted  . Abdominal pain 10/22/2017  . Cholelithiasis   . COPD (chronic obstructive pulmonary disease) ([place]) 03/09/2017  . GERD (gastroesophageal reflux disease) 03/09/2017  . Alcohol abuse 03/09/2017  . History of GI bleed 03/09/2017  . Acute ST elevation myocardial infarction (STEMI) of inferior wall ([place]) 03/09/2017  . Acute ST elevation myocardial infarction (STEMI) involving right coronary artery ([place])   . GI bleed 01/02/2017  . Hepatic cirrhosis ([place])   . Hepatocellular carcinoma ([place])   . [place] (hepatocellular carcinoma) ([place])   . Hepatitis, viral   . Cancer, hepatocellular ([place]) 08/31/2014  . Absolute anemia 08/26/2014  . Cirrhosis ([place]) 08/26/2014  . Arteriosclerosis of coronary artery 08/26/2014  . HBV (hepatitis B virus) infection 08/26/2014  . HCV (hepatitis C virus) 08/26/2014  . Chronic hepatitis C ([place]) 04/13/2014  . Foot pain 04/13/2014    Past Surgical History:  Procedure Laterality Date  . CARDIAC SURGERY    . COLONOSCOPY  WITH PROPOFOL N/A 08/31/2014   Procedure: COLONOSCOPY WITH PROPOFOL;  Surgeon: Josefine Class, MD;  Location: [facility] ENDOSCOPY;  Service: Endoscopy;  Laterality: N/A;  . CORONARY ANGIOPLASTY     at age 39 secondary to massive MI  . CORONARY/GRAFT ACUTE MI REVASCULARIZATION N/A 03/09/2017   Procedure: Coronary/Graft Acute MI Revascularization;  Surgeon: Wellington Hampshire, MD;  Location: [place] CV LAB;  Service: Cardiovascular;  Laterality: N/A;  . CORONARY/GRAFT ACUTE MI REVASCULARIZATION N/A 03/16/2017   Procedure: Coronary/Graft Acute MI Revascularization;  Surgeon: Wellington Hampshire, MD;   Location: [place] CV LAB;  Service: Cardiovascular;  Laterality: N/A;  balloon only RCA  . ESOPHAGOGASTRODUODENOSCOPY (EGD) WITH PROPOFOL N/A 08/31/2014   Procedure: ESOPHAGOGASTRODUODENOSCOPY (EGD) WITH PROPOFOL;  Surgeon: Josefine Class, MD;  Location: [facility] ENDOSCOPY;  Service: Endoscopy;  Laterality: N/A;  . ESOPHAGOGASTRODUODENOSCOPY (EGD) WITH PROPOFOL N/A 01/02/2017   Procedure: ESOPHAGOGASTRODUODENOSCOPY (EGD) WITH PROPOFOL;  Surgeon: [place], Benay Pike, MD;  Location: [facility] ENDOSCOPY;  Service: Gastroenterology;  Laterality: N/A;  . LEFT HEART CATH AND CORONARY ANGIOGRAPHY N/A 03/09/2017   Procedure: LEFT HEART CATH AND CORONARY ANGIOGRAPHY;  Surgeon: Wellington Hampshire, MD;  Location: [place] CV LAB;  Service: Cardiovascular;  Laterality: N/A;  . LEFT HEART CATH AND CORONARY ANGIOGRAPHY N/A 03/16/2017   Procedure: LEFT HEART CATH AND CORONARY ANGIOGRAPHY;  Surgeon: Wellington Hampshire, MD;  Location: [place] CV LAB;  Service: Cardiovascular;  Laterality: N/A;    Prior to Admission medications   Medication Sig Start Date End Date Taking? Authorizing Provider  atorvastatin (LIPITOR) 10 MG tablet Take 1 tablet (10 mg total) by mouth daily at 6 PM. 07/31/17   Wellington Hampshire, MD  clopidogrel (PLAVIX) 75 MG tablet Take 1 tablet (75 mg total) by mouth daily with breakfast. 03/24/17   Wellington Hampshire, MD  gabapentin (NEURONTIN) 800 MG tablet Take 800 mg by mouth 4 (four) times daily.    [provider]  lactulose (CHRONULAC) 10 GM/15ML solution Take 45 mLs (30 g total) by mouth 2 (two) times daily. 10/25/17   Bettey Costa, MD  lisinopril (PRINIVIL,ZESTRIL) 2.5 MG tablet Take 1 tablet (2.5 mg total) by mouth daily. 03/24/17   Wellington Hampshire, MD  metoprolol succinate (TOPROL-XL) 25 MG 24 hr tablet Take 0.5 tablets (12.5 mg total) by mouth daily. 04/20/17   Strader, Fransisco Hertz, PA-C  nicotine (NICODERM CQ - DOSED IN MG/24 HOURS) 21 mg/24hr patch Place 1 patch (21 mg total)  onto the skin daily. Patient not taking: Reported on 11/13/2017 10/26/17   Bettey Costa, MD  nitroGLYCERIN (NITROSTAT) 0.4 MG SL tablet Place 0.4 mg under the tongue every 5 (five) minutes as needed for chest pain.  05/10/13   [provider]  ondansetron (ZOFRAN ODT) 4 MG disintegrating tablet Take 1 tablet (4 mg total) by mouth every 8 (eight) hours as needed for nausea or vomiting. Patient not taking: Reported on 11/26/2017 11/10/17   Eula Listen, MD  ondansetron (ZOFRAN) 4 MG tablet Take 1 tablet (4 mg total) by mouth every 4 (four) hours as needed for nausea. Patient not taking: Reported on 11/13/2017 10/25/17   Bettey Costa, MD  Oxycodone HCl 10 MG TABS Take 1 tablet (10 mg total) by mouth every 6 (six) hours as needed for up to 14 days. 12/11/17 12/25/17  Sindy Guadeloupe, MD  pantoprazole (PROTONIX) 40 MG tablet Take 1 tablet (40 mg total) by mouth daily. 03/24/17   Wellington Hampshire, MD  warfarin (COUMADIN)  2.5 MG tablet Take 1 tablet (2.5 mg total) by mouth daily. 10/25/17   Bettey Costa, MD    Allergies Multihance [gadobenate]; Hydrocodone; Other; and Vicodin [hydrocodone-acetaminophen]  Family History  Problem Relation Age of Onset  . CAD Father   . Lung cancer Sister   . CAD Brother   . Heart disease Brother        CABG x 3  . Heart attack Brother        nine heart attacks  . Prostate cancer Neg Hx   . Bladder Cancer Neg Hx   . Kidney cancer Neg Hx     Social History Social History   Tobacco Use  . Smoking status: Current Every Day Smoker    Packs/day: 0.50    Years: 30.00    Pack years: 15.00    Types: Cigarettes  . Smokeless tobacco: Never Used  Substance Use Topics  . Alcohol use: Yes    Frequency: Never    Comment: "very little"  . Drug use: Yes    Types: Marijuana    Comment: pt states has not used cocaine in 20 years     Review of Systems Constitutional: Positive for fevers.  Eyes: No visual changes. ENT: No sore throat. Cardiovascular:  Denies chest pain. Respiratory: Denies shortness of breath. Gastrointestinal: Positive for abdominal pain, swelling.  Genitourinary: Negative for dysuria. Musculoskeletal: Positive for bilateral lower extremity edema.  Skin: Negative for rash. Neurological: Negative for headaches, focal weakness or numbness.  ____________________________________________   PHYSICAL EXAM:  VITAL SIGNS: ED Triage Vitals  Enc Vitals Group     BP 12/13/17 2039 116/62     Pulse Rate 12/13/17 2039 (!) 128     Resp 12/13/17 2039 (!) 26     Temp 12/13/17 2039 97.8 F (36.6 C)     Temp Source 12/13/17 2039 Oral     SpO2 12/13/17 2039 94 %     Weight 12/13/17 2040 149 lb 9.6 oz (67.9 kg)     Height 12/13/17 2040 6' (1.829 m)     Head Circumference --      Peak Flow --      Pain Score 12/13/17 2039 8    Constitutional: Alert and oriented.  Eyes: Scleral icteras ENT      Head: Normocephalic and atraumatic.      Nose: No congestion/rhinnorhea.      Mouth/Throat: Mucous membranes are moist.      Neck: No stridor. Hematological/Lymphatic/Immunilogical: No cervical lymphadenopathy. Cardiovascular: Tachycardic,  regular rhythm.  No murmurs, rubs, or gallops. Respiratory: Normal respiratory effort without tachypnea nor retractions. Breath sounds are clear and equal bilaterally. No wheezes/rales/rhonchi. Gastrointestinal: Soft. Distended, diffusely tender.  Genitourinary: Deferred Musculoskeletal: Normal range of motion in all extremities. Positive bilateral lower extremity edema.  Neurologic:  Normal speech and language. No gross focal neurologic deficits are appreciated.  Skin:  Skin is warm, dry and intact. No rash noted. Psychiatric: Mood and affect are normal. Speech and behavior are normal. Patient exhibits appropriate insight and judgment.  ____________________________________________    LABS (pertinent positives/negatives)  Lactic 6.3 CBC wbc 17.3, hgb 12.2, plt 159 CMP na 132, glu 168, cr  1.35, t bili 9.0  ____________________________________________   EKG  None  ____________________________________________    RADIOLOGY  CXR Bibasilar atelectasis.   ____________________________________________   PROCEDURES  Procedures  CRITICAL CARE Performed by: Nance Pear   Total critical care time: 35 minutes  Critical care time was exclusive of separately billable procedures and treating other  patients.  Critical care was necessary to treat or prevent imminent or life-threatening deterioration.  Critical care was time spent personally by me on the following activities: development of treatment plan with patient and/or surrogate as well as nursing, discussions with consultants, evaluation of patient's response to treatment, examination of patient, obtaining history from patient or surrogate, ordering and performing treatments and interventions, ordering and review of laboratory studies, ordering and review of radiographic studies, pulse oximetry and re-evaluation of patient's condition.  Paracentesis Performed by: Nance Pear Consent obtained. Required items: required blood products, implants, devices, and special equipment available Patient identity confirmed: verbally with patient Time out: Immediately prior to procedure a "time out" was called to verify the correct patient, procedure, equipment, support staff and site/side marked as required. Preparation: Patient was prepped and draped in the usual sterile fashion. Patient tolerance: Patient tolerated the procedure well with no immediate complications.  Location of paracentesis: Left lower quadrant US guided: Yes Volume: 41ml Description of liquid: yellow, slightly hazy   ____________________________________________   INITIAL IMPRESSION / ASSESSMENT AND PLAN / ED COURSE  Pertinent labs & imaging results that were available during my care of the patient were reviewed by me and considered in my  medical decision making (see chart for details).   Patient presented to the emergency department today because of concerns for lower extremity edema, abdominal swelling and pain.  Initial vital signs were concerning given that the patient had significant tachycardia and tachypnea.  Bedside lactic acid was performed and this was elevated raising concerns for possible infection.  Given history of cirrhosis a diagnostic paracentesis was performed by myself.  After this was performed patient was started on broad-spectrum antibiotic.  Discussed plan of admission with patient.  ____________________________________________   FINAL CLINICAL IMPRESSION(S) / ED DIAGNOSES  Final diagnoses:  Generalized abdominal pain  Leg edema  Elevated lactic acid level     Note: This dictation was prepared with Dragon dictation. Any transcriptional errors that result from this process are unintentional     Nance Pear, MD 12/13/17 [phone]

## 2017-12-13 NOTE — ED Notes (Signed)
Date and time results received: 12/13/17 9:15 PM (use smartphrase ".now" to insert current time)  Test: Lactic Acid Critical Value: 6.56, 7.12  Name of Provider Notified: Dr. Archie Balboa  Orders Received? Or Actions Taken?: Sent lactic acid to lab.

## 2017-12-13 NOTE — H&P (Signed)
[place] [facility] [place] NAME: Phillip Warren    MR#:  [id number]  DATE OF BIRTH:  Jan 31, 1955  DATE OF ADMISSION:  12/13/2017  PRIMARY CARE PHYSICIAN: Glendon Axe, MD   REQUESTING/REFERRING PHYSICIAN: Archie Balboa, MD  CHIEF COMPLAINT:   Chief Complaint  Patient presents with  . Abdominal Pain  . Leg Swelling    HISTORY OF PRESENT ILLNESS:  Phillip Warren  is a 62 y.o. male who presents with chief complaint as above.  Presents with a couple days of abdominal pain and chills at home.  Patient has liver cancer and has ascites.  He meets sepsis criteria here in the ED today, and on work-up there is some suspicion for SBP.  Diagnostic paracentesis was performed in the ED with significantly elevated white blood cells.  Hospitalist were called for admission  PAST MEDICAL HISTORY:   Past Medical History:  Diagnosis Date  . Alcohol abuse   . Anorexia    has trouble eating due to poor appetite  . Anxiety    trouble sleeping; lost two sisters and his mother close together  . Arthritis    hands, feet (burning, stinging)  . Cancer [facility])    liver lesion initial staging.  . CHF (congestive heart failure) ([place])   . Cirrhosis of liver ([place])   . Cocaine abuse ([place])   . COPD (chronic obstructive pulmonary disease) ([place])   . Coronary artery disease    Inferior ST elevation myocardial infarction in February 2019.  Cardiac catheterization showed subtotal occlusion of mid right coronary artery with severe disease affecting the LAD and left circumflex.  Successful PCI and 2 drug-eluting stent placement to the RCA.  He returned a few days after hospital discharge with stent thrombosis due to not taking any of his cardiac medications.   Marland Kitchen GERD (gastroesophageal reflux disease)   . H/O dizziness   . Headache   . Hepatitis    B and C (chronic)  . Marijuana use   . Myocardial infarction ([place])    massive MI at age 71  . Neuropathy   . Numbness and  tingling    legs and feet bilat   . Pneumonia   . Repeated falls   . Shortness of breath dyspnea   . Tinnitus   . Tobacco abuse      PAST SURGICAL HISTORY:   Past Surgical History:  Procedure Laterality Date  . CARDIAC SURGERY    . COLONOSCOPY WITH PROPOFOL N/A 08/31/2014   Procedure: COLONOSCOPY WITH PROPOFOL;  Surgeon: Josefine Class, MD;  Location: [facility] ENDOSCOPY;  Service: Endoscopy;  Laterality: N/A;  . CORONARY ANGIOPLASTY     at age 54 secondary to massive MI  . CORONARY/GRAFT ACUTE MI REVASCULARIZATION N/A 03/09/2017   Procedure: Coronary/Graft Acute MI Revascularization;  Surgeon: Wellington Hampshire, MD;  Location: [place] CV LAB;  Service: Cardiovascular;  Laterality: N/A;  . CORONARY/GRAFT ACUTE MI REVASCULARIZATION N/A 03/16/2017   Procedure: Coronary/Graft Acute MI Revascularization;  Surgeon: Wellington Hampshire, MD;  Location: [place] CV LAB;  Service: Cardiovascular;  Laterality: N/A;  balloon only RCA  . ESOPHAGOGASTRODUODENOSCOPY (EGD) WITH PROPOFOL N/A 08/31/2014   Procedure: ESOPHAGOGASTRODUODENOSCOPY (EGD) WITH PROPOFOL;  Surgeon: Josefine Class, MD;  Location: [facility] ENDOSCOPY;  Service: Endoscopy;  Laterality: N/A;  . ESOPHAGOGASTRODUODENOSCOPY (EGD) WITH PROPOFOL N/A 01/02/2017   Procedure: ESOPHAGOGASTRODUODENOSCOPY (EGD) WITH PROPOFOL;  Surgeon: Toledo, Benay Pike, MD;  Location: [facility] ENDOSCOPY;  Service: Gastroenterology;  Laterality: N/A;  .  LEFT HEART CATH AND CORONARY ANGIOGRAPHY N/A 03/09/2017   Procedure: LEFT HEART CATH AND CORONARY ANGIOGRAPHY;  Surgeon: Wellington Hampshire, MD;  Location: [place] CV LAB;  Service: Cardiovascular;  Laterality: N/A;  . LEFT HEART CATH AND CORONARY ANGIOGRAPHY N/A 03/16/2017   Procedure: LEFT HEART CATH AND CORONARY ANGIOGRAPHY;  Surgeon: Wellington Hampshire, MD;  Location: [place] CV LAB;  Service: Cardiovascular;  Laterality: N/A;     SOCIAL HISTORY:   Social History   Tobacco Use  . Smoking  status: Current Every Day Smoker    Packs/day: 0.50    Years: 30.00    Pack years: 15.00    Types: Cigarettes  . Smokeless tobacco: Never Used  Substance Use Topics  . Alcohol use: Yes    Frequency: Never    Comment: "very little"     FAMILY HISTORY:   Family History  Problem Relation Age of Onset  . CAD Father   . Lung cancer Sister   . CAD Brother   . Heart disease Brother        CABG x 3  . Heart attack Brother        nine heart attacks  . Prostate cancer Neg Hx   . Bladder Cancer Neg Hx   . Kidney cancer Neg Hx      DRUG ALLERGIES:   Allergies  Allergen Reactions  . Multihance [Gadobenate] Hives and Shortness Of Breath  . Hydrocodone Itching and Nausea And Vomiting  . Other Itching    multihance MRI dye  . Vicodin [Hydrocodone-Acetaminophen] Nausea Only and Nausea And Vomiting    Vomiting     MEDICATIONS AT HOME:   Prior to Admission medications   Medication Sig Start Date End Date Taking? Authorizing Provider  clopidogrel (PLAVIX) 75 MG tablet Take 1 tablet (75 mg total) by mouth daily with breakfast. 03/24/17  Yes Wellington Hampshire, MD  gabapentin (NEURONTIN) 800 MG tablet Take 800 mg by mouth 4 (four) times daily.   Yes [provider]  Oxycodone HCl 10 MG TABS Take 1 tablet (10 mg total) by mouth every 6 (six) hours as needed for up to 14 days. 12/11/17 12/25/17 Yes Sindy Guadeloupe, MD  pantoprazole (PROTONIX) 40 MG tablet Take 1 tablet (40 mg total) by mouth daily. 03/24/17  Yes Wellington Hampshire, MD  atorvastatin (LIPITOR) 10 MG tablet Take 1 tablet (10 mg total) by mouth daily at 6 PM. 07/31/17   Wellington Hampshire, MD  lactulose (CHRONULAC) 10 GM/15ML solution Take 45 mLs (30 g total) by mouth 2 (two) times daily. 10/25/17   Bettey Costa, MD  lisinopril (PRINIVIL,ZESTRIL) 2.5 MG tablet Take 1 tablet (2.5 mg total) by mouth daily. 03/24/17   Wellington Hampshire, MD  metoprolol succinate (TOPROL-XL) 25 MG 24 hr tablet Take 0.5 tablets (12.5 mg total) by  mouth daily. 04/20/17   Strader, Fransisco Hertz, PA-C  nicotine (NICODERM CQ - DOSED IN MG/24 HOURS) 21 mg/24hr patch Place 1 patch (21 mg total) onto the skin daily. Patient not taking: Reported on 11/13/2017 10/26/17   Bettey Costa, MD  nitroGLYCERIN (NITROSTAT) 0.4 MG SL tablet Place 0.4 mg under the tongue every 5 (five) minutes as needed for chest pain.  05/10/13   [provider]  ondansetron (ZOFRAN ODT) 4 MG disintegrating tablet Take 1 tablet (4 mg total) by mouth every 8 (eight) hours as needed for nausea or vomiting. Patient not taking: Reported on 11/26/2017 11/10/17   Eula Listen, MD  ondansetron (ZOFRAN) 4 MG tablet Take 1 tablet (4 mg total) by mouth every 4 (four) hours as needed for nausea. Patient not taking: Reported on 11/13/2017 10/25/17   Bettey Costa, MD  warfarin (COUMADIN) 2.5 MG tablet Take 1 tablet (2.5 mg total) by mouth daily. 10/25/17   Bettey Costa, MD    REVIEW OF SYSTEMS:  Review of Systems  Constitutional: Positive for chills. Negative for fever, malaise/fatigue and weight loss.  HENT: Negative for ear pain, hearing loss and tinnitus.   Eyes: Negative for blurred vision, double vision, pain and redness.  Respiratory: Negative for cough, hemoptysis and shortness of breath.   Cardiovascular: Positive for leg swelling. Negative for chest pain, palpitations and orthopnea.  Gastrointestinal: Positive for abdominal pain and nausea. Negative for constipation, diarrhea and vomiting.  Genitourinary: Negative for dysuria, frequency and hematuria.  Musculoskeletal: Negative for back pain, joint pain and neck pain.  Skin:       No acne, rash, or lesions  Neurological: Negative for dizziness, tremors, focal weakness and weakness.  Endo/Heme/Allergies: Negative for polydipsia. Does not bruise/bleed easily.  Psychiatric/Behavioral: Negative for depression. The patient is not nervous/anxious and does not have insomnia.      VITAL SIGNS:   Vitals:   12/13/17  2040 12/13/17 2200 12/13/17 2203 12/13/17 2300  BP:  118/74 118/74 125/78  Pulse:   (!) 124 (!) 121  Resp:  18 (!) 24 16  Temp:   98.1 F (36.7 C)   TempSrc:   Oral   SpO2:   95% 92%  Weight: 67.9 kg     Height: 6' (1.829 m)      Wt Readings from Last 3 Encounters:  12/13/17 67.9 kg  11/26/17 67.9 kg  11/23/17 70.2 kg    PHYSICAL EXAMINATION:  Physical Exam  Vitals reviewed. Constitutional: He is oriented to person, place, and time. He appears well-developed and well-nourished. No distress.  HENT:  Head: Normocephalic and atraumatic.  Mouth/Throat: Oropharynx is clear and moist.  Eyes: Pupils are equal, round, and reactive to light. Conjunctivae and EOM are normal. No scleral icterus.  Neck: Normal range of motion. Neck supple. No JVD present. No thyromegaly present.  Cardiovascular: Regular rhythm and intact distal pulses. Exam reveals no gallop and no friction rub.  No murmur heard. Tachycardic  Respiratory: Effort normal and breath sounds normal. No respiratory distress. He has no wheezes. He has no rales.  GI: Soft. Bowel sounds are normal. He exhibits distension. There is tenderness.  Musculoskeletal: Normal range of motion. He exhibits edema.  No arthritis, no gout  Lymphadenopathy:    He has no cervical adenopathy.  Neurological: He is alert and oriented to person, place, and time. No cranial nerve deficit.  No dysarthria, no aphasia  Skin: Skin is warm and dry. No rash noted. No erythema.  Psychiatric: He has a normal mood and affect. His behavior is normal. Judgment and thought content normal.    LABORATORY PANEL:   CBC Recent Labs  Lab 12/13/17 2051  WBC 17.3*  HGB 12.2*  HCT 37.7*  PLT 159   ------------------------------------------------------------------------------------------------------------------  Chemistries  Recent Labs  Lab 12/13/17 2051  NA 132*  K 4.9  CL 97*  CO2 20*  GLUCOSE 168*  BUN 28*  CREATININE 1.35*  CALCIUM 9.9  AST  190*  ALT 36  ALKPHOS 175*  BILITOT 9.0*   ------------------------------------------------------------------------------------------------------------------  Cardiac Enzymes No results for input(s): TROPONINI in the last 168 hours. ------------------------------------------------------------------------------------------------------------------  RADIOLOGY:  Dg Chest 2 View  Result Date: 12/13/2017 CLINICAL DATA:  Leg swelling for 3-4 days.  Abdominal pain. EXAM: CHEST - 2 VIEW COMPARISON:  10/22/2017 FINDINGS: AP and lateral views of the chest show low volumes. There is basilar atelectasis bilaterally, left greater than right. The cardiopericardial silhouette is within normal limits for size. Bones are diffusely demineralized. Telemetry leads overlie the chest. IMPRESSION: Basilar atelectasis, left greater than right. Electronically Signed   By: Misty Stanley M.D.   On: 12/13/2017 21:36    EKG:   Orders placed or performed during the hospital encounter of 11/10/17  . EKG 12-Lead  . EKG 12-Lead  . EKG    IMPRESSION AND PLAN:  Principal Problem:   Sepsis ([place]) -IV antibiotics initiated, lactic acid was significantly elevated and was improving some with IV fluids in the ED, will continue IV fluid administration and continue to check lactic until within normal limits.  Blood pressure was stable.  Cultures sent. Active Problems:   Arteriosclerosis of coronary artery -continue home meds   Cancer, hepatocellular ([place]) -this is the cause of his ascites, he is requiring IV fluid due to his sepsis and lactic acidosis, he may require therapeutic paracentesis at some point prior to discharge   COPD (chronic obstructive pulmonary disease) (HCC) -home dose inhalers   GERD (gastroesophageal reflux disease) -Home dose PPI  Chart review performed and case discussed with ED provider. Labs, imaging and/or ECG reviewed by provider and discussed with patient/family. Management plans discussed with  the patient and/or family.  DVT PROPHYLAXIS: Subq lovenox  GI PROPHYLAXIS:  PPI   ADMISSION STATUS: Inpatient     CODE STATUS: Full Code Status History    Date Active Date Inactive Code Status Order ID Comments User Context   10/23/2017 0043 10/25/2017 1417 Full Code [id number]  Amelia Jo, MD Inpatient   03/16/2017 2024 03/18/2017 1420 Full Code [id number]  Gorden Harms, MD Inpatient   03/16/2017 2024 03/16/2017 2024 Full Code [id number]  Wellington Hampshire, MD Inpatient   03/09/2017 2103 03/12/2017 1725 Full Code [id number]  Wellington Hampshire, MD Inpatient   01/02/2017 1410 01/03/2017 1330 Full Code [id number]  Vaughan Basta, MD Inpatient      TOTAL TIME TAKING CARE OF THIS PATIENT: 45 minutes.   Rodell Marrs FIELDING 12/13/2017, 11:49 PM  [facility]  Office  [phone]  CC: Primary care physician; Glendon Axe, MD  Note:  This document was prepared using Dragon voice recognition software and may include unintentional dictation errors.

## 2017-12-14 ENCOUNTER — Inpatient Hospital Stay: Payer: Medicare Other

## 2017-12-14 LAB — BODY FLUID CELL COUNT WITH DIFFERENTIAL
EOS FL: 0 %
Eos, Fluid: 0 %
LYMPHS FL: 2 %
Lymphs, Fluid: 0 %
Monocyte-Macrophage-Serous Fluid: 30 %
Monocyte-Macrophage-Serous Fluid: 12 %
NEUTROPHIL FLUID: 68 %
NEUTROPHIL FLUID: 88 %
Other Cells, Fluid: 0 %
Other Cells, Fluid: 0 %
Total Nucleated Cell Count, Fluid: 2351 cu mm
WBC FLUID: 2028 uL

## 2017-12-14 LAB — BLOOD CULTURE ID PANEL (REFLEXED)
ACINETOBACTER BAUMANNII: NOT DETECTED
CANDIDA PARAPSILOSIS: NOT DETECTED
CANDIDA TROPICALIS: NOT DETECTED
Candida albicans: NOT DETECTED
Candida glabrata: NOT DETECTED
Candida krusei: NOT DETECTED
Enterobacter cloacae complex: NOT DETECTED
Enterobacteriaceae species: NOT DETECTED
Enterococcus species: NOT DETECTED
Escherichia coli: NOT DETECTED
HAEMOPHILUS INFLUENZAE: NOT DETECTED
KLEBSIELLA OXYTOCA: NOT DETECTED
Klebsiella pneumoniae: NOT DETECTED
Listeria monocytogenes: NOT DETECTED
Neisseria meningitidis: NOT DETECTED
Proteus species: NOT DETECTED
Pseudomonas aeruginosa: NOT DETECTED
STREPTOCOCCUS SPECIES: DETECTED — AB
Serratia marcescens: NOT DETECTED
Staphylococcus aureus (BCID): NOT DETECTED
Staphylococcus species: NOT DETECTED
Streptococcus agalactiae: NOT DETECTED
Streptococcus pneumoniae: DETECTED — AB
Streptococcus pyogenes: NOT DETECTED

## 2017-12-14 LAB — LACTATE DEHYDROGENASE, PLEURAL OR PERITONEAL FLUID: LD, Fluid: 34 U/L — ABNORMAL HIGH (ref 3–23)

## 2017-12-14 LAB — BASIC METABOLIC PANEL
Anion gap: 9 (ref 5–15)
BUN: 26 mg/dL — AB (ref 6–20)
CO2: 24 mmol/L (ref 22–32)
Calcium: 9.3 mg/dL (ref 8.9–10.3)
Chloride: 98 mmol/L (ref 98–111)
Creatinine, Ser: 1.31 mg/dL — ABNORMAL HIGH (ref 0.61–1.24)
GFR calc Af Amer: >60 mL/min (ref >60)
GFR calc non Af Amer: 58 mL/min — ABNORMAL LOW (ref >60)
GLUCOSE: 170 mg/dL — AB (ref 70–99)
POTASSIUM: 4.9 mmol/L (ref 3.5–5.1)
Sodium: 131 mmol/L — ABNORMAL LOW (ref 135–145)

## 2017-12-14 LAB — ALBUMIN, PLEURAL OR PERITONEAL FLUID

## 2017-12-14 LAB — PATHOLOGIST SMEAR REVIEW

## 2017-12-14 LAB — CBC
HEMATOCRIT: 35.9 % — AB (ref 39.0–52.0)
HEMOGLOBIN: 11.8 g/dL — AB (ref 13.0–17.0)
MCH: 27.4 pg (ref 26.0–34.0)
MCHC: 32.9 g/dL (ref 30.0–36.0)
MCV: 83.5 fL (ref 80.0–100.0)
Platelets: 134 10*3/uL — ABNORMAL LOW (ref 150–400)
RBC: 4.3 MIL/uL (ref 4.22–5.81)
RDW: 24.4 % — ABNORMAL HIGH (ref 11.5–15.5)
WBC: 15.2 10*3/uL — ABNORMAL HIGH (ref 4.0–10.5)
nRBC: 0 % (ref 0.0–0.2)

## 2017-12-14 LAB — GLUCOSE, PLEURAL OR PERITONEAL FLUID: Glucose, Fluid: 150 mg/dL

## 2017-12-14 LAB — CG4 I-STAT (LACTIC ACID): Lactic Acid, Venous: 7.12 mmol/L (ref 0.5–1.9)

## 2017-12-14 LAB — PROTEIN, PLEURAL OR PERITONEAL FLUID: Total protein, fluid: <3 g/dL

## 2017-12-14 LAB — LACTIC ACID, PLASMA
LACTIC ACID, VENOUS: 5.1 mmol/L — AB (ref 0.5–1.9)
LACTIC ACID, VENOUS: 2.4 mmol/L — AB (ref 0.5–1.9)
Lactic Acid, Venous: 3 mmol/L (ref 0.5–1.9)
Lactic Acid, Venous: 2.4 mmol/L (ref 0.5–1.9)

## 2017-12-14 MED ORDER — PANTOPRAZOLE SODIUM 40 MG PO TBEC
40.0000 mg | DELAYED_RELEASE_TABLET | Freq: Every day | ORAL | Status: DC
  Administered 2017-12-14 – 2017-12-15 (×2): 40 mg via ORAL
  Filled 2017-12-14 (×2): qty 1

## 2017-12-14 MED ORDER — VANCOMYCIN HCL IN DEXTROSE 1-5 GM/200ML-% IV SOLN
1000.0000 mg | INTRAVENOUS | Status: DC
  Administered 2017-12-14: 1000 mg via INTRAVENOUS
  Filled 2017-12-14: qty 200

## 2017-12-14 MED ORDER — SODIUM CHLORIDE 0.9 % IV SOLN: 2.0000 g | Freq: Two times a day (BID) | INTRAVENOUS | Status: DC

## 2017-12-14 MED ORDER — ENOXAPARIN SODIUM 40 MG/0.4ML ~~LOC~~ SOLN: 40.0000 mg | SUBCUTANEOUS | Status: DC

## 2017-12-14 MED ORDER — SODIUM CHLORIDE 0.9 % IV SOLN
2.0000 g | INTRAVENOUS | Status: DC
  Administered 2017-12-14 – 2017-12-17 (×4): 2 g via INTRAVENOUS
  Filled 2017-12-14 (×3): qty 2
  Filled 2017-12-14: qty 20
  Filled 2017-12-14: qty 2

## 2017-12-14 MED ORDER — ALBUMIN HUMAN 5 % IV SOLN
25.0000 g | Freq: Once | INTRAVENOUS | Status: AC
  Administered 2017-12-14: 25 g via INTRAVENOUS
  Filled 2017-12-14: qty 500

## 2017-12-14 MED ORDER — ONDANSETRON HCL 4 MG/2ML IJ SOLN
4.0000 mg | Freq: Four times a day (QID) | INTRAMUSCULAR | Status: DC | PRN
  Administered 2017-12-14 – 2017-12-17 (×7): 4 mg via INTRAVENOUS
  Filled 2017-12-14 (×7): qty 2

## 2017-12-14 MED ORDER — ONDANSETRON HCL 4 MG PO TABS
4.0000 mg | ORAL_TABLET | Freq: Four times a day (QID) | ORAL | Status: DC | PRN
  Administered 2017-12-15 – 2017-12-16 (×2): 4 mg via ORAL
  Filled 2017-12-14 (×2): qty 1

## 2017-12-14 MED ORDER — SODIUM CHLORIDE 0.9 % IV SOLN
INTRAVENOUS | Status: AC
  Administered 2017-12-14: 02:00:00 via INTRAVENOUS

## 2017-12-14 MED ORDER — IBUPROFEN 400 MG PO TABS: 400.0000 mg | ORAL_TABLET | Freq: Four times a day (QID) | ORAL | Status: DC | PRN

## 2017-12-14 MED ORDER — GABAPENTIN 400 MG PO CAPS
800.0000 mg | ORAL_CAPSULE | Freq: Four times a day (QID) | ORAL | Status: DC
  Administered 2017-12-14 – 2017-12-16 (×12): 800 mg via ORAL
  Filled 2017-12-14 (×13): qty 2

## 2017-12-14 NOTE — Progress Notes (Signed)
[place] at [place] NAME: Summit Arroyave    MR#:  [id number]  DATE OF BIRTH:  December 22, 1955  SUBJECTIVE:  CHIEF COMPLAINT:   Chief Complaint  Patient presents with  . Abdominal Pain  . Leg Swelling  Seen and evaluated today Has generalized weakness Discomfort secondary to abdominal distention Has swelling in the lower extremities REVIEW OF SYSTEMS:    ROS  CONSTITUTIONAL: No documented fever. Has fatigue, weakness. No weight gain, no weight loss.  EYES: No blurry or double vision.  ENT: No tinnitus. No postnasal drip. No redness of the oropharynx.  RESPIRATORY: No cough, no wheeze, no hemoptysis. No dyspnea.  CARDIOVASCULAR: No chest pain. No orthopnea. No palpitations. No syncope.  GASTROINTESTINAL: No nausea, no vomiting or diarrhea. Has abdominal pain. No melena or hematochezia.  Has abdominal distention GENITOURINARY: No dysuria or hematuria.  ENDOCRINE: No polyuria or nocturia. No heat or cold intolerance.  HEMATOLOGY: No anemia. No bruising. No bleeding.  INTEGUMENTARY: No rashes. No lesions.  MUSCULOSKELETAL: No arthritis. Has swelling in the lower extremities. No gout.  NEUROLOGIC: No numbness, tingling, or ataxia. No seizure-type activity.  PSYCHIATRIC: No anxiety. No insomnia. No ADD.   DRUG ALLERGIES:   Allergies  Allergen Reactions  . Multihance [Gadobenate] Hives and Shortness Of Breath  . Hydrocodone Itching and Nausea And Vomiting  . Other Itching    multihance MRI dye  . Vicodin [Hydrocodone-Acetaminophen] Nausea Only and Nausea And Vomiting    Vomiting     VITALS:  Blood pressure (!) 149/83, pulse (!) 118, temperature 98.1 F (36.7 C), temperature source Oral, resp. rate 19, height 6' (1.829 m), weight 67.9 kg, SpO2 97 %.  PHYSICAL EXAMINATION:   Physical Exam  GENERAL:  62 y.o.-year-old patient lying in the bed with no acute distress.  EYES: Pupils equal, round, reactive to light and accommodation. No  scleral icterus. Extraocular muscles intact.  HEENT: Head atraumatic, normocephalic. Oropharynx and nasopharynx clear.  NECK:  Supple, no jugular venous distention. No thyroid enlargement, no tenderness.  LUNGS: Normal breath sounds bilaterally, no wheezing, rales, rhonchi. No use of accessory muscles of respiration.  CARDIOVASCULAR: S1, S2 normal. No murmurs, rubs, or gallops.  ABDOMEN: Soft, mild tenderness noted, distended. Bowel sounds present. No organomegaly or mass.  EXTREMITIES: No cyanosis, clubbing  Has edema b/l.    NEUROLOGIC: Cranial nerves II through XII are intact. No focal Motor or sensory deficits b/l.   PSYCHIATRIC: The patient is alert and oriented x 3.  SKIN: No obvious rash, lesion, or ulcer.   LABORATORY PANEL:   CBC Recent Labs  Lab 12/14/17 0147  WBC 15.2*  HGB 11.8*  HCT 35.9*  PLT 134*   ------------------------------------------------------------------------------------------------------------------ Chemistries  Recent Labs  Lab 12/13/17 2051 12/14/17 0147  NA 132* 131*  K 4.9 4.9  CL 97* 98  CO2 20* 24  GLUCOSE 168* 170*  BUN 28* 26*  CREATININE 1.35* 1.31*  CALCIUM 9.9 9.3  AST 190*  --   ALT 36  --   ALKPHOS 175*  --   BILITOT 9.0*  --    ------------------------------------------------------------------------------------------------------------------  Cardiac Enzymes No results for input(s): TROPONINI in the last 168 hours. ------------------------------------------------------------------------------------------------------------------  RADIOLOGY:  Dg Chest 2 View  Result Date: 12/13/2017 CLINICAL DATA:  Leg swelling for 3-4 days.  Abdominal pain. EXAM: CHEST - 2 VIEW COMPARISON:  10/22/2017 FINDINGS: AP and lateral views of the chest show low volumes. There is basilar atelectasis bilaterally, left greater than right. The  cardiopericardial silhouette is within normal limits for size. Bones are diffusely demineralized. Telemetry leads  overlie the chest. IMPRESSION: Basilar atelectasis, left greater than right. Electronically Signed   By: Misty Stanley M.D.   On: 12/13/2017 21:36     ASSESSMENT AND PLAN:   62 year old male patient with history of COPD, cirrhosis of liver, congestive heart failure, hepatocellular cancer, chronic hepatitis B and C currently under hospitalist service for abdominal distention  -Sepsis IV fluids Continue IV antibiotic Cultures and sensitivities Follow-up lactic acid  -Streptococcus pneumonia bacteremia Antibiotics switched to IV Rocephin after discussion with pharmacy Follow-up sensitivities  -Decompensated liver disease with ascites Ultrasound-guided paracentesis  -Chronic congestive heart failure Currently receiving IV fluids secondary to sepsis and elevated lactic acid Once lactic acid comes down we will stop IV fluids  -Long-term prognosis poor  All the records are reviewed and case discussed with Care Management/Social Worker. Management plans discussed with the patient, family and they are in agreement.  CODE STATUS: Full code  DVT Prophylaxis: SCDs  TOTAL TIME TAKING CARE OF THIS PATIENT: 36 minutes.   POSSIBLE D/C IN 2 to 3 DAYS, DEPENDING ON CLINICAL CONDITION.  Saundra Shelling M.D on 12/14/2017 at 11:56 AM  Between 7am to 6pm - Pager - [phone]  After 6pm go to [email or web address] - password [email or web address] [facility]  Office  [phone]  CC: Primary care physician; Glendon Axe, MD  Note: This dictation was prepared with Dragon dictation along with smaller phrase technology. Any transcriptional errors that result from this process are unintentional.

## 2017-12-14 NOTE — Plan of Care (Signed)
  Problem: Education: Goal: Knowledge of General Education information will improve Description Including pain rating scale, medication(s)/side effects and non-pharmacologic comfort measures Outcome: Progressing   Problem: Health Behavior/Discharge Planning: Goal: Ability to manage health-related needs will improve Outcome: Progressing   

## 2017-12-14 NOTE — Progress Notes (Signed)
Patient went to [place]. NSL. Patient c/o pain. Getting pain meds Q6H. Notified MD. No new orders at this time.

## 2017-12-14 NOTE — Progress Notes (Signed)
CRITICAL VALUE ALERT  Critical Value:  Lactic acid 2.4  Date & Time Notied:  12/14/17 @ 1340  Provider Notified: Dr. Estanislado Pandy  Orders Received/Actions taken: No new orders

## 2017-12-14 NOTE — ED Notes (Signed)
Pt provided with po fluids.

## 2017-12-14 NOTE — ED Notes (Signed)
Pt up to urinate, missed urinal. Pt's feet and floor cleansed, will recollect urine when sample is available.

## 2017-12-14 NOTE — Progress Notes (Signed)
CRITICAL VALUE ALERT  Critical Value:  La 5.1   Date & Time Notied: 0227  Provider Notified:[id number]   Orders Received/Actions taken: Dr. Jannifer Franklin  No new orders giving

## 2017-12-14 NOTE — ED Notes (Signed)
Not able to take pt upstairs until telemetry box available on floor.

## 2017-12-14 NOTE — Progress Notes (Signed)
CRITICAL VALUE ALERT  Critical Value:  Lactic acid 2.4  Date & Time Notied:  12/14/17 @ 0800  Provider Notified: Dr. Estanislado Pandy  Orders Received/Actions taken: no new orders

## 2017-12-14 NOTE — Consult Note (Signed)
Pharmacy Antibiotic Note  Phillip Warren is a 62 y.o. male admitted on 12/13/2017 with sepsis.  Pharmacy has been consulted for Cefepime and Vancomycin dosing. Patient received vancomycin 1g IV and Cefepime 2g IV x 1 dose in ED.   Plan: Ke: 0.043  Vd: 47.53  T1/2: 16.12  Start Vancomycin 1000 IV every 24 hours with 6 hour stack dosing.  Goal trough 15-20 mcg/mL. Calculated trough @ Css is 15. Trough level ordered prior to 4th dose.   Start Cefepime 2g IV every 12 hours.   Height: 6' (182.9 cm) Weight: 149 lb 9.6 oz (67.9 kg) IBW/kg (Calculated) : 77.6  Temp (24hrs), Avg:98 F (36.7 C), Min:97.8 F (36.6 C), Max:98.1 F (36.7 C)  Recent Labs  Lab 12/13/17 2051 12/13/17 2101 12/13/17 2109 12/13/17 2114 12/13/17 2309  WBC 17.3*  --   --   --   --   CREATININE 1.35*  --   --   --   --   LATICACIDVEN  --  6.56* 7.12* 6.3* 4.74*    Estimated Creatinine Clearance: 55.9 mL/min (A) (by C-G formula based on SCr of 1.35 mg/dL (H)).    Allergies  Allergen Reactions  . Multihance [Gadobenate] Hives and Shortness Of Breath  . Hydrocodone Itching and Nausea And Vomiting  . Other Itching    multihance MRI dye  . Vicodin [Hydrocodone-Acetaminophen] Nausea Only and Nausea And Vomiting    Vomiting     Antimicrobials this admission: 11/17 Cefepime >>  11/17 vancomycin >>   Microbiology results: 11/17  BCx: pending 11/17 Body fluid Cx: pending    Thank you for allowing pharmacy to be a part of this patient's care.  Pernell Dupre, PharmD, BCPS Clinical Pharmacist 12/14/2017 12:32 AM

## 2017-12-14 NOTE — Progress Notes (Addendum)
Albumin ordered for 1200. No dose delivered as of 1341, text pharmacy. Pharmacy replied at 1357, med on the way. Has of 1520 no medication as been delivered. Notified pharmacy again.   Received it at 1615.

## 2017-12-14 NOTE — Progress Notes (Signed)
CRITICAL VALUE ALERT  Critical Value:  La 3.1   Date & Time Notied:  0450  Provider Notified: Dr. Marcille Blanco   Orders Received/Actions taken: no new orders given

## 2017-12-14 NOTE — Care Management Note (Signed)
Case Management Note  Patient Details  Name: Phillip Warren MRN: [id number] Date of Birth: March 07, 1955  Subjective/Objective:                  RNCM met with patient to discuss transition of care. He is from home alone but receives support from his daughter "that lives [place] two [place] over".  He states he depends on her for transportation.  He is interested in home health but wants to wait to "see how he does". This is his 3 presentation to hospital due to medical complications.  He is not currently receiving palliative in the community but may be interested when I presented it to him.  His [facility]CP [facility] with [facility]- Dr. Glendon Axe.  He obtains medications from [facility] without problems.  Action/Plan: Home health list left with patient for review.    Expected Discharge Date:                  Expected Discharge Plan:     In-House Referral:     Discharge planning Services  CM Consult  Post Acute Care Choice:  Home Health Choice offered to:  Patient  DME Arranged:    DME Agency:     HH Arranged:    [place] Agency:     Status of Service:  In process, will continue to follow  If discussed at Long Length of Stay Meetings, dates discussed:    Additional Comments:  Marshell Garfinkel, RN 12/14/2017, 9:45 AM

## 2017-12-15 DIAGNOSIS — Z885 Allergy status to narcotic agent status: Secondary | ICD-10-CM

## 2017-12-15 DIAGNOSIS — B182 Chronic viral hepatitis C: Secondary | ICD-10-CM

## 2017-12-15 DIAGNOSIS — F17210 Nicotine dependence, cigarettes, uncomplicated: Secondary | ICD-10-CM

## 2017-12-15 DIAGNOSIS — B953 Streptococcus pneumoniae as the cause of diseases classified elsewhere: Secondary | ICD-10-CM

## 2017-12-15 DIAGNOSIS — Z7902 Long term (current) use of antithrombotics/antiplatelets: Secondary | ICD-10-CM

## 2017-12-15 DIAGNOSIS — I8510 Secondary esophageal varices without bleeding: Secondary | ICD-10-CM

## 2017-12-15 DIAGNOSIS — K652 Spontaneous bacterial peritonitis: Secondary | ICD-10-CM

## 2017-12-15 DIAGNOSIS — E43 Unspecified severe protein-calorie malnutrition: Secondary | ICD-10-CM

## 2017-12-15 DIAGNOSIS — B181 Chronic viral hepatitis B without delta-agent: Secondary | ICD-10-CM

## 2017-12-15 DIAGNOSIS — Z7189 Other specified counseling: Secondary | ICD-10-CM

## 2017-12-15 DIAGNOSIS — C220 Liver cell carcinoma: Secondary | ICD-10-CM

## 2017-12-15 DIAGNOSIS — R188 Other ascites: Secondary | ICD-10-CM

## 2017-12-15 DIAGNOSIS — I2510 Atherosclerotic heart disease of native coronary artery without angina pectoris: Secondary | ICD-10-CM

## 2017-12-15 DIAGNOSIS — I820 Budd-Chiari syndrome: Secondary | ICD-10-CM

## 2017-12-15 DIAGNOSIS — Z91041 Radiographic dye allergy status: Secondary | ICD-10-CM

## 2017-12-15 DIAGNOSIS — R180 Malignant ascites: Secondary | ICD-10-CM

## 2017-12-15 DIAGNOSIS — Z888 Allergy status to other drugs, medicaments and biological substances status: Secondary | ICD-10-CM

## 2017-12-15 DIAGNOSIS — R7881 Bacteremia: Secondary | ICD-10-CM

## 2017-12-15 DIAGNOSIS — B1920 Unspecified viral hepatitis C without hepatic coma: Secondary | ICD-10-CM

## 2017-12-15 DIAGNOSIS — B1910 Unspecified viral hepatitis B without hepatic coma: Secondary | ICD-10-CM

## 2017-12-15 DIAGNOSIS — Z955 Presence of coronary angioplasty implant and graft: Secondary | ICD-10-CM

## 2017-12-15 DIAGNOSIS — Z515 Encounter for palliative care: Secondary | ICD-10-CM

## 2017-12-15 DIAGNOSIS — K7460 Unspecified cirrhosis of liver: Secondary | ICD-10-CM

## 2017-12-15 LAB — BASIC METABOLIC PANEL
ANION GAP: 9 (ref 5–15)
BUN: 26 mg/dL — ABNORMAL HIGH (ref 6–20)
CALCIUM: 9.1 mg/dL (ref 8.9–10.3)
CO2: 23 mmol/L (ref 22–32)
Chloride: 99 mmol/L (ref 98–111)
Creatinine, Ser: 1 mg/dL (ref 0.61–1.24)
Glucose, Bld: 147 mg/dL — ABNORMAL HIGH (ref 70–99)
Potassium: 4.2 mmol/L (ref 3.5–5.1)
Sodium: 131 mmol/L — ABNORMAL LOW (ref 135–145)

## 2017-12-15 LAB — CBC
HEMATOCRIT: 31.7 % — AB (ref 39.0–52.0)
Hemoglobin: 10.4 g/dL — ABNORMAL LOW (ref 13.0–17.0)
MCH: 27.4 pg (ref 26.0–34.0)
MCHC: 32.8 g/dL (ref 30.0–36.0)
MCV: 83.4 fL (ref 80.0–100.0)
NRBC: 0 % (ref 0.0–0.2)
PLATELETS: 121 10*3/uL — AB (ref 150–400)
RBC: 3.8 MIL/uL — ABNORMAL LOW (ref 4.22–5.81)
RDW: 24.4 % — ABNORMAL HIGH (ref 11.5–15.5)
WBC: 8.4 10*3/uL (ref 4.0–10.5)

## 2017-12-15 LAB — PROTEIN, BODY FLUID (OTHER): TOTAL PROTEIN, BODY FLUID OTHER: 0.9 g/dL

## 2017-12-15 MED ORDER — OCTREOTIDE ACETATE 100 MCG/ML IJ SOLN
100.0000 ug | Freq: Three times a day (TID) | INTRAMUSCULAR | Status: DC
  Administered 2017-12-16 – 2017-12-17 (×4): 100 ug via SUBCUTANEOUS
  Filled 2017-12-15 (×8): qty 1

## 2017-12-15 MED ORDER — ALBUMIN HUMAN 25 % IV SOLN
25.0000 g | Freq: Every day | INTRAVENOUS | Status: DC
  Administered 2017-12-15 – 2017-12-17 (×3): 25 g via INTRAVENOUS
  Filled 2017-12-15 (×3): qty 100

## 2017-12-15 MED ORDER — DIPHENHYDRAMINE HCL 25 MG PO CAPS
25.0000 mg | ORAL_CAPSULE | Freq: Three times a day (TID) | ORAL | Status: DC | PRN
  Administered 2017-12-15: 25 mg via ORAL
  Filled 2017-12-15: qty 1

## 2017-12-15 MED ORDER — ADULT MULTIVITAMIN W/MINERALS CH
1.0000 | ORAL_TABLET | Freq: Every day | ORAL | Status: DC
  Administered 2017-12-16: 1 via ORAL
  Filled 2017-12-15 (×2): qty 1

## 2017-12-15 MED ORDER — MIDODRINE HCL 5 MG PO TABS
5.0000 mg | ORAL_TABLET | Freq: Three times a day (TID) | ORAL | Status: DC
  Administered 2017-12-15 – 2017-12-17 (×7): 5 mg via ORAL
  Filled 2017-12-15 (×7): qty 1

## 2017-12-15 MED ORDER — VITAMIN K1 10 MG/ML IJ SOLN
10.0000 mg | Freq: Every day | INTRAMUSCULAR | Status: AC
  Administered 2017-12-15 – 2017-12-17 (×3): 10 mg via SUBCUTANEOUS
  Filled 2017-12-15 (×3): qty 1

## 2017-12-15 NOTE — Progress Notes (Signed)
ID Pt with liver cancer, cirrhosis SBP strep pneumo bacteremia Check sputum 2 d echo Currently on ceftriaxone Await repeat culture

## 2017-12-15 NOTE — Consult Note (Addendum)
Phillip Warren , MD [address] [place], [place], [place], [id number] [place], [place], [place], [address] Phone: [phone]  Fax: [phone]  Consultation  Referring Provider:   Dr Estanislado Pandy Primary Care Physician:  Glendon Axe, MD Primary Gastroenterologist:  [facility]         Reason for Consultation:     Liver disease decompensation  Date of Admission:  12/13/2017 Date of Consultation:  12/15/2017         HPI:   Phillip Warren is a 62 y.o. male is a patient who is known to [place] and has been seen by Dr. Alverda Skeans in the past. The diagnosis of hepatitis C and hepatitis B infections in 2011.  Treatment nave.  History of illegal drug use in the past.  History of excess alcohol consumption in the past but had quit drinking. 2.1 cm HCC in 2016 and underwent percutaneous thermal ablation at [facility] in October 2016.  Known to have grade 2 esophageal varices in the past.  Plan in October 2018 was to determine his hepatitis B and C status and talk about treatment.   He was seen in December 2018 with hematemesis.  Upper endoscopy by Dr. Alice Reichert in December 2018.  For banding of bleeding varices.  No subsequent follow-up for endoscopy or outpatient visit. Known to have portal vein thrombosis.   MRI of the abdomen obtained 09/2017 that  shows hepatic cirrhosis and a liver mass essentially replacing the right lobe of the liver contiguous with expansile occlusive thrombus within the main right and left portal veins.  Suggestive of advanced recurrent hepatocellular carcinoma.  Moderate splenomegaly.  Cholelithiasis.  Mild diffuse small bowel wall thickening nonspecific probably due to noninflammatory edema.  Nonspecific retroperitoneal adenopathy.  Infrarenal 3.2 cm AAA.  He has an appointment to see Dr. Alice Reichert on 12/31/2017.  He follows with Dr Janese Banks in oncology.Treament for [facility] pending treatment for Hep B.   He was admitted yesterday for abdominal pain.   Ascitic fluid tapped  yesterday : [place] of 2351 with 88% neutrophils. Neurtophil count of 2068 , AKI  He says he has been having abdominal pain for a few days, no fevers, still has abdominal pain. Rapid re accumulation of ascites.     Past Medical History:  Diagnosis Date  . Alcohol abuse   . Anorexia    has trouble eating due to poor appetite  . Anxiety    trouble sleeping; lost two sisters and his mother close together  . Arthritis    hands, feet (burning, stinging)  . Cancer [facility])    liver lesion initial staging.  . CHF (congestive heart failure) ([place])   . Cirrhosis of liver ([place])   . Cocaine abuse ([place])   . COPD (chronic obstructive pulmonary disease) ([place])   . Coronary artery disease    Inferior ST elevation myocardial infarction in February 2019.  Cardiac catheterization showed subtotal occlusion of mid right coronary artery with severe disease affecting the LAD and left circumflex.  Successful PCI and 2 drug-eluting stent placement to the RCA.  He returned a few days after hospital discharge with stent thrombosis due to not taking any of his cardiac medications.   Marland Kitchen GERD (gastroesophageal reflux disease)   . H/O dizziness   . Headache   . Hepatitis    B and C (chronic)  . Marijuana use   . Myocardial infarction ([place])    massive MI at age 41  . Neuropathy   .  Numbness and tingling    legs and feet bilat   . Pneumonia   . Repeated falls   . Shortness of breath dyspnea   . Tinnitus   . Tobacco abuse     Past Surgical History:  Procedure Laterality Date  . CARDIAC SURGERY    . COLONOSCOPY WITH PROPOFOL N/A 08/31/2014   Procedure: COLONOSCOPY WITH PROPOFOL;  Surgeon: Josefine Class, MD;  Location: [facility] ENDOSCOPY;  Service: Endoscopy;  Laterality: N/A;  . CORONARY ANGIOPLASTY     at age 35 secondary to massive MI  . CORONARY/GRAFT ACUTE MI REVASCULARIZATION N/A 03/09/2017   Procedure: Coronary/Graft Acute MI Revascularization;  Surgeon: Wellington Hampshire, MD;  Location: [place] CV  LAB;  Service: Cardiovascular;  Laterality: N/A;  . CORONARY/GRAFT ACUTE MI REVASCULARIZATION N/A 03/16/2017   Procedure: Coronary/Graft Acute MI Revascularization;  Surgeon: Wellington Hampshire, MD;  Location: [place] CV LAB;  Service: Cardiovascular;  Laterality: N/A;  balloon only RCA  . ESOPHAGOGASTRODUODENOSCOPY (EGD) WITH PROPOFOL N/A 08/31/2014   Procedure: ESOPHAGOGASTRODUODENOSCOPY (EGD) WITH PROPOFOL;  Surgeon: Josefine Class, MD;  Location: [facility] ENDOSCOPY;  Service: Endoscopy;  Laterality: N/A;  . ESOPHAGOGASTRODUODENOSCOPY (EGD) WITH PROPOFOL N/A 01/02/2017   Procedure: ESOPHAGOGASTRODUODENOSCOPY (EGD) WITH PROPOFOL;  Surgeon: [place], Benay Pike, MD;  Location: [facility] ENDOSCOPY;  Service: Gastroenterology;  Laterality: N/A;  . LEFT HEART CATH AND CORONARY ANGIOGRAPHY N/A 03/09/2017   Procedure: LEFT HEART CATH AND CORONARY ANGIOGRAPHY;  Surgeon: Wellington Hampshire, MD;  Location: [place] CV LAB;  Service: Cardiovascular;  Laterality: N/A;  . LEFT HEART CATH AND CORONARY ANGIOGRAPHY N/A 03/16/2017   Procedure: LEFT HEART CATH AND CORONARY ANGIOGRAPHY;  Surgeon: Wellington Hampshire, MD;  Location: [place] CV LAB;  Service: Cardiovascular;  Laterality: N/A;    Prior to Admission medications   Medication Sig Start Date End Date Taking? Authorizing Provider  gabapentin (NEURONTIN) 800 MG tablet Take 800 mg by mouth 4 (four) times daily.   Yes [provider]  Oxycodone HCl 10 MG TABS Take 1 tablet (10 mg total) by mouth every 6 (six) hours as needed for up to 14 days. 12/11/17 12/25/17 Yes Sindy Guadeloupe, MD  pantoprazole (PROTONIX) 40 MG tablet Take 1 tablet (40 mg total) by mouth daily. 03/24/17  Yes Wellington Hampshire, MD  atorvastatin (LIPITOR) 10 MG tablet Take 1 tablet (10 mg total) by mouth daily at 6 PM. Patient not taking: Reported on 12/13/2017 07/31/17   Wellington Hampshire, MD  clopidogrel (PLAVIX) 75 MG tablet Take 1 tablet (75 mg total) by mouth daily with  breakfast. Patient not taking: Reported on 12/13/2017 03/24/17   Wellington Hampshire, MD  lactulose (CHRONULAC) 10 GM/15ML solution Take 45 mLs (30 g total) by mouth 2 (two) times daily. Patient not taking: Reported on 12/13/2017 10/25/17   Bettey Costa, MD  lisinopril (PRINIVIL,ZESTRIL) 2.5 MG tablet Take 1 tablet (2.5 mg total) by mouth daily. Patient not taking: Reported on 12/13/2017 03/24/17   Wellington Hampshire, MD  metoprolol succinate (TOPROL-XL) 25 MG 24 hr tablet Take 0.5 tablets (12.5 mg total) by mouth daily. Patient not taking: Reported on 12/13/2017 04/20/17   Erma Heritage, PA-C    Family History  Problem Relation Age of Onset  . CAD Father   . Lung cancer Sister   . CAD Brother   . Heart disease Brother        CABG x 3  . Heart attack Brother  nine heart attacks  . Prostate cancer Neg Hx   . Bladder Cancer Neg Hx   . Kidney cancer Neg Hx      Social History   Tobacco Use  . Smoking status: Current Every Day Smoker    Packs/day: 0.50    Years: 30.00    Pack years: 15.00    Types: Cigarettes  . Smokeless tobacco: Never Used  Substance Use Topics  . Alcohol use: Yes    Frequency: Never    Comment: "very little"  . Drug use: Yes    Types: Marijuana    Comment: pt states has not used cocaine in 20 years     Allergies as of 12/13/2017 - Review Complete 12/13/2017  Allergen Reaction Noted  . Multihance [gadobenate] Hives and Shortness Of Breath 08/11/2014  . Hydrocodone Itching and Nausea And Vomiting 08/11/2014  . Other Itching 08/14/2014  . Vicodin [hydrocodone-acetaminophen] Nausea Only and Nausea And Vomiting 08/08/2014    Review of Systems:    All systems reviewed and negative except where noted in HPI.   Physical Exam:  Vital signs in last 24 hours: Temp:  [98 F (36.7 C)-98.5 F (36.9 C)] 98.1 F (36.7 C) (11/19 0727) Pulse Rate:  [108-115] 110 (11/19 0727) Resp:  [16-17] 16 (11/19 0727) BP: (100-120)/(61-75) 110/70 (11/19 0727) SpO2:   [92 %-95 %] 92 % (11/19 0727) Last BM Date: 12/13/17 General:   Thin , malnourished and cachectic Head:  Normocephalic and atraumatic. Eyes:   No icterus.   Conjunctiva pink. PERRLA. Ears:  Normal auditory acuity. Neck:  Supple; no masses or thyroidomegaly Lungs: Respirations even and unlabored. Lungs clear to auscultation bilaterally.   No wheezes, crackles, or rhonchi.  Heart:  Regular rate and rhythm;  Without murmur, clicks, rubs or gallops Abdomen:  Distended, generalized tenderness, free fluid +, tense ascites , BS+ Neurologic:  Alert and oriented x3;  grossly normal neurologically. Skin:  Intact without significant lesions or rashes. Cervical Nodes:  No significant cervical adenopathy. Psych:  Alert and cooperative. Normal affect.  LAB RESULTS: Recent Labs    12/13/17 2051 12/14/17 0147 12/15/17 0344  WBC 17.3* 15.2* 8.4  HGB 12.2* 11.8* 10.4*  HCT 37.7* 35.9* 31.7*  PLT 159 134* 121*   BMET Recent Labs    12/13/17 2051 12/14/17 0147 12/15/17 0344  NA 132* 131* 131*  K 4.9 4.9 4.2  CL 97* 98 99  CO2 20* 24 23  GLUCOSE 168* 170* 147*  BUN 28* 26* 26*  CREATININE 1.35* 1.31* 1.00  CALCIUM 9.9 9.3 9.1   LFT Recent Labs    12/13/17 2051  PROT 8.1  ALBUMIN 2.6*  AST 190*  ALT 36  ALKPHOS 175*  BILITOT 9.0*   PT/INR Recent Labs    12/13/17 2051  LABPROT 20.0*  INR 1.73    STUDIES: Dg Chest 2 View  Result Date: 12/13/2017 CLINICAL DATA:  Leg swelling for 3-4 days.  Abdominal pain. EXAM: CHEST - 2 VIEW COMPARISON:  10/22/2017 FINDINGS: AP and lateral views of the chest show low volumes. There is basilar atelectasis bilaterally, left greater than right. The cardiopericardial silhouette is within normal limits for size. Bones are diffusely demineralized. Telemetry leads overlie the chest. IMPRESSION: Basilar atelectasis, left greater than right. Electronically Signed   By: Misty Stanley M.D.   On: 12/13/2017 21:36   US Paracentesis  Result Date:  12/14/2017 INDICATION: Hepatocellular carcinoma and refractory ascites. EXAM: ULTRASOUND GUIDED PARACENTESIS MEDICATIONS: None. COMPLICATIONS: None immediate. PROCEDURE: Informed written consent  was obtained from the patient after a discussion of the risks, benefits and alternatives to treatment. A timeout was performed prior to the initiation of the procedure. Initial ultrasound was performed to localize ascites. The right lower abdomen was prepped and draped in the usual sterile fashion. 1% lidocaine was used for local anesthesia. Following this, a 6 Fr Safe-T-Centesis catheter was introduced. An ultrasound image was saved for documentation purposes. The paracentesis was performed. The catheter was removed and a dressing was applied. The patient tolerated the procedure well without immediate post procedural complication. FINDINGS: A total of approximately 5 L of yellowish fluid was removed. IMPRESSION: Successful ultrasound-guided paracentesis yielding 5 liters of peritoneal fluid. Electronically Signed   By: Aletta Edouard M.D.   On: 12/14/2017 14:05      Impression / Plan:   Phillip Warren is a 62 y.o. y/o male with decompensated liver disease seocndary to  hepatitis B/C ,  alcohol abuse in the past that he has quit.  History of hepatocellular carcinoma in the past.   MRI demonstrated portal vein thrombosis as well as a recurrence of his hepatocellular carcinoma in the right lobe of the liver in 09/2017 Presented to the ER with abdominal pain. Diagnostic paracentesis suggests SBP.In addition with INR >1.5 (1.7) has acute liver failure.  He does not have much social support, SBP has a very high mortality rate and poor prognostic marker.    Plan  1. Continue Ceftriaxone for SBP. If clinically not improving them may need to change. Continue for 5 days and reasses on day 5 clinically  2. Infuse albumin 25 grams of 25 % daily  3. Suggest Octreotide and midodrine as he has developed AKI and to help  prevent hepatorenal syndrome.  4. Follow up fluid culture results 5. At discharge needs to go on medications for SBP prophylaxsis - suggest daily Bactrim D.S once a day  6. Avoid diuretics/NSAID's at this time : I notice he has Ibuprofen on his MAR which I will stop in addition to PPI 7. He has a large qty of ascites and may benefit from repeat drainage in 1-2 days. Can drain to dryness - administer 8 grams of albumin 25% strength for every litre over 5 litre's drained.  8. Low salt diet  9. He is ready to consider hospice care, he does not have social support for a liver transplant hence would not be a candidate . He cannot have any chemo therapy I believe till his hepatitis B is treated. At this point with SBP and AKI/hepatorenal syndrome- very high mortality rate .  10. Would not suggest an indwelling catheter at this moment for comfort as he has an active infection, would need to be cleared before it is considered.   11. Vitamin K S.C 10 mg once daily for 3 days to correct any malnutrition related vitamin K deficiences.       LOS: 2 days   Phillip Bellows, MD  12/15/2017, 9:28 AM

## 2017-12-15 NOTE — Progress Notes (Signed)
[place] at [place] NAME: Phillip Warren    MR#:  [id number]  DATE OF BIRTH:  1955-06-23  SUBJECTIVE:  CHIEF COMPLAINT:   Chief Complaint  Patient presents with  . Abdominal Pain  . Leg Swelling  Seen and evaluated today Has generalized weakness Discomfort secondary to abdominal distention improved after paracentesis  swelling in the lower extremities better REVIEW OF SYSTEMS:    ROS  CONSTITUTIONAL: No documented fever. Has fatigue, weakness. No weight gain, no weight loss.  EYES: No blurry or double vision.  ENT: No tinnitus. No postnasal drip. No redness of the oropharynx.  RESPIRATORY: No cough, no wheeze, no hemoptysis. No dyspnea.  CARDIOVASCULAR: No chest pain. No orthopnea. No palpitations. No syncope.  GASTROINTESTINAL: No nausea, no vomiting or diarrhea. Has decreased abdominal pain. No melena or hematochezia.  Has abdominal distention GENITOURINARY: No dysuria or hematuria.  ENDOCRINE: No polyuria or nocturia. No heat or cold intolerance.  HEMATOLOGY: No anemia. No bruising. No bleeding.  INTEGUMENTARY: No rashes. No lesions.  MUSCULOSKELETAL: No arthritis. Has swelling in the lower extremities. No gout.  NEUROLOGIC: No numbness, tingling, or ataxia. No seizure-type activity.  PSYCHIATRIC: No anxiety. No insomnia. No ADD.   DRUG ALLERGIES:   Allergies  Allergen Reactions  . Multihance [Gadobenate] Hives and Shortness Of Breath  . Hydrocodone Itching and Nausea And Vomiting  . Other Itching    multihance MRI dye  . Vicodin [Hydrocodone-Acetaminophen] Nausea Only and Nausea And Vomiting    Vomiting     VITALS:  Blood pressure 110/70, pulse (!) 110, temperature 98.1 F (36.7 C), temperature source Oral, resp. rate 16, height 6' (1.829 m), weight 67.9 kg, SpO2 92 %.  PHYSICAL EXAMINATION:   Physical Exam  GENERAL:  62 y.o.-year-old patient lying in the bed with no acute distress.  EYES: Pupils equal, round,  reactive to light and accommodation. No scleral icterus. Extraocular muscles intact.  HEENT: Head atraumatic, normocephalic. Oropharynx and nasopharynx clear.  NECK:  Supple, no jugular venous distention. No thyroid enlargement, no tenderness.  LUNGS: Normal breath sounds bilaterally, no wheezing, rales, rhonchi. No use of accessory muscles of respiration.  CARDIOVASCULAR: S1, S2 normal. No murmurs, rubs, or gallops.  ABDOMEN: Soft, mild tenderness noted, distended. Bowel sounds present. No organomegaly or mass.  EXTREMITIES: No cyanosis, clubbing  Has edema b/l.    NEUROLOGIC: Cranial nerves II through XII are intact. No focal Motor or sensory deficits b/l.   PSYCHIATRIC: The patient is alert and oriented x 3.  SKIN: No obvious rash, lesion, or ulcer.   LABORATORY PANEL:   CBC Recent Labs  Lab 12/15/17 0344  WBC 8.4  HGB 10.4*  HCT 31.7*  PLT 121*   ------------------------------------------------------------------------------------------------------------------ Chemistries  Recent Labs  Lab 12/13/17 2051  12/15/17 0344  NA 132*   < > 131*  K 4.9   < > 4.2  CL 97*   < > 99  CO2 20*   < > 23  GLUCOSE 168*   < > 147*  BUN 28*   < > 26*  CREATININE 1.35*   < > 1.00  CALCIUM 9.9   < > 9.1  AST 190*  --   --   ALT 36  --   --   ALKPHOS 175*  --   --   BILITOT 9.0*  --   --    < > = values in this interval not displayed.   ------------------------------------------------------------------------------------------------------------------  Cardiac Enzymes No results  for input(s): TROPONINI in the last 168 hours. ------------------------------------------------------------------------------------------------------------------  RADIOLOGY:  Dg Chest 2 View  Result Date: 12/13/2017 CLINICAL DATA:  Leg swelling for 3-4 days.  Abdominal pain. EXAM: CHEST - 2 VIEW COMPARISON:  10/22/2017 FINDINGS: AP and lateral views of the chest show low volumes. There is basilar atelectasis  bilaterally, left greater than right. The cardiopericardial silhouette is within normal limits for size. Bones are diffusely demineralized. Telemetry leads overlie the chest. IMPRESSION: Basilar atelectasis, left greater than right. Electronically Signed   By: Misty Stanley M.D.   On: 12/13/2017 21:36   US Paracentesis  Result Date: 12/14/2017 INDICATION: Hepatocellular carcinoma and refractory ascites. EXAM: ULTRASOUND GUIDED PARACENTESIS MEDICATIONS: None. COMPLICATIONS: None immediate. PROCEDURE: Informed written consent was obtained from the patient after a discussion of the risks, benefits and alternatives to treatment. A timeout was performed prior to the initiation of the procedure. Initial ultrasound was performed to localize ascites. The right lower abdomen was prepped and draped in the usual sterile fashion. 1% lidocaine was used for local anesthesia. Following this, a 6 Fr Safe-T-Centesis catheter was introduced. An ultrasound image was saved for documentation purposes. The paracentesis was performed. The catheter was removed and a dressing was applied. The patient tolerated the procedure well without immediate post procedural complication. FINDINGS: A total of approximately 5 L of yellowish fluid was removed. IMPRESSION: Successful ultrasound-guided paracentesis yielding 5 liters of peritoneal fluid. Electronically Signed   By: Aletta Edouard M.D.   On: 12/14/2017 14:05     ASSESSMENT AND PLAN:   62 year old male patient with history of COPD, cirrhosis of liver, congestive heart failure, hepatocellular cancer, chronic hepatitis B and C currently under hospitalist service for abdominal distention  -Sepsis secondary to bacteremia IV fluids finished We do not want to fluid overload the patient Continue IV antibiotic Cultures and sensitivities to follow-up  -Streptococcus pneumonia bacteremia Continue IV Rocephin IV Rocephin antibiotic Follow-up sensitivities  -Decompensated liver  disease with ascites Ultrasound-guided paracentesis done yesterday Albumin infusion given Follow-up peritoneal fluid results Follow-up gastroenterology recommendations  -Chronic congestive heart failure Stable Not in overt heart failure  -Long-term prognosis poor  -Palliative medicine consult  All the records are reviewed and case discussed with Care Management/Social Worker. Management plans discussed with the patient, family and they are in agreement.  CODE STATUS: Full code  DVT Prophylaxis: SCDs  TOTAL TIME TAKING CARE OF THIS PATIENT: 32 minutes.   POSSIBLE D/C IN 2 to 3 DAYS, DEPENDING ON CLINICAL CONDITION.  Saundra Shelling M.D on 12/15/2017 at 1:21 PM  Between 7am to 6pm - Pager - [phone]  After 6pm go to [email or web address] - password [email or web address] [facility]  Office  [phone]  CC: Primary care physician; Glendon Axe, MD  Note: This dictation was prepared with Dragon dictation along with smaller phrase technology. Any transcriptional errors that result from this process are unintentional.

## 2017-12-15 NOTE — Consult Note (Signed)
NAME: Phillip Warren  DOB: 1955/03/14  MRN: [id number]  Date/Time: 12/15/2017 4:43 PM  pyreddy Subjective:  REASON FOR CONSULT: pneumococcus bacteremia ? Phillip Warren is a 62 y.o. male with a history of CAD s/p stent HEPB, HEPC, cirrhosis liver, [place] in 2016 fr which he has thermal ablation, grade 2 esophageal varices, recent hospitalization Sept 2019 when he was diagnosed with liver mass and portal vein thrombosis and was discharged on coumadin admitted on 12/13/17 from home with edema legs and abdominal pain and chills of a few days duration. In the ED he was afebrile, SBP was suspected, blood cultrues sent and he was started on vanco and cefepime - had paracentesis which showed neutrophilic predomonance. Blood cultrue came back as strep pneumo bacteremia and antibiotics deescalated to ceftriaxone and I am asked to see patient. Pt says he is feeling better Has abdominal pain and distension No dysuria Minimal cough No chest pain Poor appetite   Past Medical History:  Diagnosis Date  . Alcohol abuse   . Anorexia    has trouble eating due to poor appetite  . Anxiety    trouble sleeping; lost two sisters and his mother close together  . Arthritis    hands, feet (burning, stinging)  . Cancer [facility])    liver lesion initial staging.  . CHF (congestive heart failure) ([place])   . Cirrhosis of liver ([place])   . Cocaine abuse ([place])   . COPD (chronic obstructive pulmonary disease) ([place])   . Coronary artery disease    Inferior ST elevation myocardial infarction in February 2019.  Cardiac catheterization showed subtotal occlusion of mid right coronary artery with severe disease affecting the LAD and left circumflex.  Successful PCI and 2 drug-eluting stent placement to the RCA.  He returned a few days after hospital discharge with stent thrombosis due to not taking any of his cardiac medications.   Marland Kitchen GERD (gastroesophageal reflux disease)   . H/O dizziness   . Headache   . Hepatitis    B and  C (chronic)  . Marijuana use   . Myocardial infarction ([place])    massive MI at age 76  . Neuropathy   . Numbness and tingling    legs and feet bilat   . Pneumonia   . Repeated falls   . Shortness of breath dyspnea   . Tinnitus   . Tobacco abuse     Past Surgical History:  Procedure Laterality Date  . CARDIAC SURGERY    . COLONOSCOPY WITH PROPOFOL N/A 08/31/2014   Procedure: COLONOSCOPY WITH PROPOFOL;  Surgeon: Josefine Class, MD;  Location: [facility] ENDOSCOPY;  Service: Endoscopy;  Laterality: N/A;  . CORONARY ANGIOPLASTY     at age 45 secondary to massive MI  . CORONARY/GRAFT ACUTE MI REVASCULARIZATION N/A 03/09/2017   Procedure: Coronary/Graft Acute MI Revascularization;  Surgeon: Wellington Hampshire, MD;  Location: [place] CV LAB;  Service: Cardiovascular;  Laterality: N/A;  . CORONARY/GRAFT ACUTE MI REVASCULARIZATION N/A 03/16/2017   Procedure: Coronary/Graft Acute MI Revascularization;  Surgeon: Wellington Hampshire, MD;  Location: [place] CV LAB;  Service: Cardiovascular;  Laterality: N/A;  balloon only RCA  . ESOPHAGOGASTRODUODENOSCOPY (EGD) WITH PROPOFOL N/A 08/31/2014   Procedure: ESOPHAGOGASTRODUODENOSCOPY (EGD) WITH PROPOFOL;  Surgeon: Josefine Class, MD;  Location: [facility] ENDOSCOPY;  Service: Endoscopy;  Laterality: N/A;  . ESOPHAGOGASTRODUODENOSCOPY (EGD) WITH PROPOFOL N/A 01/02/2017   Procedure: ESOPHAGOGASTRODUODENOSCOPY (EGD) WITH PROPOFOL;  Surgeon: [place], Benay Pike, MD;  Location: [facility] ENDOSCOPY;  Service: Gastroenterology;  Laterality: N/A;  . LEFT HEART CATH AND CORONARY ANGIOGRAPHY N/A 03/09/2017   Procedure: LEFT HEART CATH AND CORONARY ANGIOGRAPHY;  Surgeon: Wellington Hampshire, MD;  Location: [place] CV LAB;  Service: Cardiovascular;  Laterality: N/A;  . LEFT HEART CATH AND CORONARY ANGIOGRAPHY N/A 03/16/2017   Procedure: LEFT HEART CATH AND CORONARY ANGIOGRAPHY;  Surgeon: Wellington Hampshire, MD;  Location: [place] CV LAB;  Service: Cardiovascular;   Laterality: N/A;    Social History   Socioeconomic History  . Marital status: Widowed    Spouse name: Not on file  . Number of children: Not on file  . Years of education: Not on file  . Highest education level: Not on file  Occupational History  . Not on file  Social Needs  . Financial resource strain: Not on file  . Food insecurity:    Worry: Not on file    Inability: Not on file  . Transportation needs:    Medical: Not on file    Non-medical: Not on file  Tobacco Use  . Smoking status: Current Every Day Smoker    Packs/day: 0.50    Years: 30.00    Pack years: 15.00    Types: Cigarettes  . Smokeless tobacco: Never Used  Substance and Sexual Activity  . Alcohol use: Yes    Frequency: Never    Comment: "very little"  . Drug use: Yes    Types: Marijuana    Comment: pt states has not used cocaine in 20 years   . Sexual activity: Not on file  Lifestyle  . Physical activity:    Days per week: Not on file    Minutes per session: Not on file  . Stress: Not on file  Relationships  . Social connections:    Talks on phone: Not on file    Gets together: Not on file    Attends religious service: Not on file    Active member of club or organization: Not on file    Attends meetings of clubs or organizations: Not on file    Relationship status: Not on file  . Intimate partner violence:    Fear of current or ex partner: Not on file    Emotionally abused: Not on file    Physically abused: Not on file    Forced sexual activity: Not on file  Other Topics Concern  . Not on file  Social History Narrative  . Not on file    Family History  Problem Relation Age of Onset  . CAD Father   . Lung cancer Sister   . CAD Brother   . Heart disease Brother        CABG x 3  . Heart attack Brother        nine heart attacks  . Prostate cancer Neg Hx   . Bladder Cancer Neg Hx   . Kidney cancer Neg Hx    Allergies  Allergen Reactions  . Multihance [Gadobenate] Hives and Shortness  Of Breath  . Hydrocodone Itching and Nausea And Vomiting  . Other Itching    multihance MRI dye  . Vicodin [Hydrocodone-Acetaminophen] Nausea Only and Nausea And Vomiting    Vomiting    Current Facility-Administered Medications  Medication Dose Route Frequency Provider Last Rate Last Dose  . albumin human 25 % solution 25 g  25 g Intravenous Daily Jonathon Bellows, MD      . cefTRIAXone (ROCEPHIN) 2 g in sodium chloride 0.9 % 100 mL IVPB  2 g Intravenous Q24H Saundra Shelling, MD 200 mL/hr at 12/15/17 1211 2 g at 12/15/17 1211  . diphenhydrAMINE (BENADRYL) capsule 25 mg  25 mg Oral Q8H PRN Saundra Shelling, MD   25 mg at 12/15/17 1301  . gabapentin (NEURONTIN) capsule 800 mg  800 mg Oral QID Lance Coon, MD   800 mg at 12/15/17 1301  . midodrine (PROAMATINE) tablet 5 mg  5 mg Oral TID WC Jonathon Bellows, MD      . Derrill Memo ON 12/16/2017] multivitamin with minerals tablet 1 tablet  1 tablet Oral Daily Pyreddy, Pavan, MD      . octreotide (SANDOSTATIN) injection 100 mcg  100 mcg Subcutaneous TID Jonathon Bellows, MD      . ondansetron [facility]) tablet 4 mg  4 mg Oral Q6H PRN Lance Coon, MD       Or  . ondansetron [facility]) injection 4 mg  4 mg Intravenous Q6H PRN Lance Coon, MD   4 mg at 12/15/17 1454  . oxyCODONE (Oxy IR/ROXICODONE) immediate release tablet 10 mg  10 mg Oral Q6H PRN Lance Coon, MD   10 mg at 12/15/17 1427  . phytonadione (VITAMIN K) SQ injection 10 mg  10 mg Subcutaneous Daily Jonathon Bellows, MD         Abtx:  Anti-infectives (From admission, onward)   Start     Dose/Rate Route Frequency Ordered Stop   12/15/17 1000  ceFEPIme (MAXIPIME) 2 g in sodium chloride 0.9 % 100 mL IVPB  Status:  Discontinued     2 g 200 mL/hr over 30 Minutes Intravenous Every 12 hours 12/14/17 0031 12/14/17 1140   12/14/17 1200  cefTRIAXone (ROCEPHIN) 2 g in sodium chloride 0.9 % 100 mL IVPB     2 g 200 mL/hr over 30 Minutes Intravenous Every 24 hours 12/14/17 1140     12/14/17 0600  vancomycin (VANCOCIN)  IVPB 1000 mg/200 mL premix  Status:  Discontinued     1,000 mg 200 mL/hr over 60 Minutes Intravenous Every 24 hours 12/14/17 0031 12/14/17 1140   12/13/17 2215  ceFEPIme (MAXIPIME) 2 g in sodium chloride 0.9 % 100 mL IVPB     2 g 200 mL/hr over 30 Minutes Intravenous  Once 12/13/17 2211 12/13/17 2329   12/13/17 2215  vancomycin (VANCOCIN) IVPB 1000 mg/200 mL premix     1,000 mg 200 mL/hr over 60 Minutes Intravenous  Once 12/13/17 2211 12/14/17 0032      REVIEW OF SYSTEMS:  Const: negative fever, + chills, ++weight loss Eyes: negative diplopia or visual changes, negative eye pain ENT: negative coryza, negative sore throat Resp: + cough, minimal sputum, no hemoptysis,has  dyspnea Cards: negative for chest pain, palpitations, has lower extremity edema GU: negative for frequency, dysuria and hematuria GI: + abdominal pain, no diarrhea, bleeding, constipation Skin: negative for rash and pruritus Heme: negative for easy bruising and gum/nose bleeding MS:  back pain and muscle weakness Neurolo:negative for headaches, dizziness, vertigo, memory problems  Psych: negative for feelings of anxiety, depression  Endocrine: negative for thyroid, diabetes issues Allergy/Immunology- allergic to contrast.   Objective:  VITALS:  BP 112/84 (BP Location: Left Arm)   Pulse (!) 120   Temp 98.1 F (36.7 C) (Oral)   Resp 18   Ht 6' (1.829 m)   Wt 71.3 kg Comment: bed scale  SpO2 94%   BMI 21.32 kg/m  PHYSICAL EXAM:  General: Alert, cooperative, no distress, severely emaciated Head: Normocephalic, without obvious abnormality, atraumatic. Eyes: Conjunctivae clear, icteric  sclerae. Pupils are equal ENT Nares normal. No drainage or sinus tenderness. Lips, mucosa, and tongue normal. No Thrush Neck: Supple, symmetrical, no adenopathy, thyroid: non tender no carotid bruit and no JVD. Back: No CVA tenderness. Lungs: b/l air entry decreased bases Heart: s1s2 Abdomen:  distended. Fluid thrill  present- mildly tender to palpation- stretched skin Extremities: edema legs Skin: spider nevi, bruises Lymph: Cervical, supraclavicular normal. Neurologic: Grossly non-focal Pertinent Labs Lab Results CBC    Component Value Date/Time   WBC 8.4 12/15/2017 0344   RBC 3.80 (L) 12/15/2017 0344   HGB 10.4 (L) 12/15/2017 0344   HGB 16.4 05/14/2014 1606   HCT 31.7 (L) 12/15/2017 0344   HCT 49.7 05/14/2014 1606   PLT 121 (L) 12/15/2017 0344   PLT 111 (L) 05/14/2014 1606   MCV 83.4 12/15/2017 0344   MCV 96 05/14/2014 1606   MCH 27.4 12/15/2017 0344   MCHC 32.8 12/15/2017 0344   RDW 24.4 (H) 12/15/2017 0344   RDW 14.7 (H) 05/14/2014 1606   LYMPHSABS 1.3 12/13/2017 2051   LYMPHSABS 1.8 05/14/2014 1606   MONOABS 0.8 12/13/2017 2051   MONOABS 0.5 05/14/2014 1606   EOSABS 0.0 12/13/2017 2051   EOSABS 0.2 05/14/2014 1606   BASOSABS 0.1 12/13/2017 2051   BASOSABS 0.0 05/14/2014 1606    CMP Latest Ref Rng & Units 12/15/2017 12/14/2017 12/13/2017  Glucose 70 - 99 mg/dL 147(H) 170(H) 168(H)  BUN 6 - 20 mg/dL 26(H) 26(H) 28(H)  Creatinine 0.61 - 1.24 mg/dL 1.00 1.31(H) 1.35(H)  Sodium 135 - 145 mmol/L 131(L) 131(L) 132(L)  Potassium 3.5 - 5.1 mmol/L 4.2 4.9 4.9  Chloride 98 - 111 mmol/L 99 98 97(L)  CO2 22 - 32 mmol/L 23 24 20(L)  Calcium 8.9 - 10.3 mg/dL 9.1 9.3 9.9  Total Protein 6.5 - 8.1 g/dL - - 8.1  Total Bilirubin 0.3 - 1.2 mg/dL - - 9.0(H)  Alkaline Phos 38 - 126 U/L - - 175(H)  AST 15 - 41 U/L - - 190(H)  ALT 0 - 44 U/L - - 36      Microbiology: Recent Results (from the past 240 hour(s))  Culture, blood (Routine x 2)     Status: Abnormal (Preliminary result)   Collection Time: 12/13/17  8:51 PM  Result Value Ref Range Status   Specimen Description   Final    BLOOD RIGHT ANTECUBITAL Performed at [facility], [address] [place], [place] [address]    Special Requests   Final    BOTTLES DRAWN AEROBIC AND ANAEROBIC Blood Culture results may not be optimal  due to an excessive volume of blood received in culture bottles Performed at [facility], [address] [place], [place] [address]    Culture  Setup Time   Final    [place].  VALUE IS CONSISTENT WITH PREVIOUSLY REPORTED AND CALLED VALUE. Performed at [facility], [address], [place], [place] [address]    Culture STREPTOCOCCUS PNEUMONIAE (A)  Final   Report Status PENDING  Incomplete  Culture, blood (Routine x 2)     Status: Abnormal (Preliminary result)   Collection Time: 12/13/17  8:51 PM  Result Value Ref Range Status   Specimen Description   Final    BLOOD LEFT ANTECUBITAL Performed at [facility], [address] [place], [place] [address]    Special Requests   Final    BOTTLES DRAWN AEROBIC ONLY Blood Culture adequate volume Performed  at [facility], [address], [place], [place] [address]    Culture  Setup Time   Final    GRAM POSITIVE COCCI AEROBIC BOTTLE ONLY CRITICAL RESULT CALLED TO, READ BACK BY AND VERIFIED WITH: KAREN HAYES AT 1126 12/14/17 SDR    Culture (A)  Final    STREPTOCOCCUS PNEUMONIAE SUSCEPTIBILITIES TO FOLLOW Performed at [facility], [place] [address] [place], [place] [address]    Report Status PENDING  Incomplete  Blood Culture ID Panel (Reflexed)     Status: Abnormal   Collection Time: 12/13/17  8:51 PM  Result Value Ref Range Status   Enterococcus species NOT DETECTED NOT DETECTED Final   Listeria monocytogenes NOT DETECTED NOT DETECTED Final   Staphylococcus species NOT DETECTED NOT DETECTED Final   Staphylococcus aureus (BCID) NOT DETECTED NOT DETECTED Final   Streptococcus species DETECTED (A) NOT DETECTED Final    Comment: CRITICAL RESULT CALLED TO, READ BACK BY AND VERIFIED WITH:  KAREN HAYES AT 1126 12/14/17 SDR    Streptococcus agalactiae NOT DETECTED NOT DETECTED Final   Streptococcus pneumoniae DETECTED (A) NOT DETECTED Final     Comment: CRITICAL RESULT CALLED TO, READ BACK BY AND VERIFIED WITH:  KAREN HAYES AT 1126 12/14/17 SDR    Streptococcus pyogenes NOT DETECTED NOT DETECTED Final   Acinetobacter baumannii NOT DETECTED NOT DETECTED Final   Enterobacteriaceae species NOT DETECTED NOT DETECTED Final   Enterobacter cloacae complex NOT DETECTED NOT DETECTED Final   Escherichia coli NOT DETECTED NOT DETECTED Final   Klebsiella oxytoca NOT DETECTED NOT DETECTED Final   Klebsiella pneumoniae NOT DETECTED NOT DETECTED Final   Proteus species NOT DETECTED NOT DETECTED Final   Serratia marcescens NOT DETECTED NOT DETECTED Final   Haemophilus influenzae NOT DETECTED NOT DETECTED Final   Neisseria meningitidis NOT DETECTED NOT DETECTED Final   Pseudomonas aeruginosa NOT DETECTED NOT DETECTED Final   Candida albicans NOT DETECTED NOT DETECTED Final   Candida glabrata NOT DETECTED NOT DETECTED Final   Candida krusei NOT DETECTED NOT DETECTED Final   Candida parapsilosis NOT DETECTED NOT DETECTED Final   Candida tropicalis NOT DETECTED NOT DETECTED Final    Comment: Performed at [facility], [address] [place], [place] [address]  Body fluid culture     Status: None (Preliminary result)   Collection Time: 12/13/17 10:12 PM  Result Value Ref Range Status   Specimen Description   Final    FLUID PERITONEAL Performed at [facility], [address], [place], [place] [address]    Special Requests   Final    NONE Performed at [facility], [address] [place], [place] [address]    Gram Stain   Final    WBC PRESENT, PREDOMINANTLY PMN NO ORGANISMS SEEN CYTOSPIN SMEAR    Culture   Final    NO GROWTH 1 DAY Performed at [facility], [place] [address], [place], [place] [address]    Report Status PENDING  Incomplete  Body fluid culture     Status: None (Preliminary result)   Collection Time: 12/14/17  1:15 PM  Result Value Ref Range Status   Specimen Description   Final     PERITONEAL Performed at [facility], [address] [place], [place] [address]    Special Requests   Final    NONE Performed at [facility], [address] [place],  [address]    Gram Stain   Final  ABUNDANT WBC PRESENT, PREDOMINANTLY PMN NO ORGANISMS SEEN    Culture   Final    NO GROWTH < 24 HOURS Performed at [place] [address], [place], [place] [address]    Report Status PENDING  Incomplete   IMAGING RESULTS: ? Impression/Recommendation ?62 y.o. male with a history of CAD s/p stent HEPB, HEPC, cirrhosis liver, HCC in 2016 fr which he has thermal ablation, grade 2 esophageal varices, recent hospitalization Sept 2019 when he was diagnosed with liver mass and portal vein thrombosis and was discharged on coumadin admitted on 12/13/17 from home with edema legs and abdominal pain and chills of a few days duration. In the ED he was afebrile, SBP was suspected, blood cultrues sent and he was started on vanco and cefepime - had paracentesis which showed neutrophilic predomonance. Blood cultrue came back as strep pneumo bacteremia and antibiotics deescalated to ceftriaxone and I am asked to see patient.  Streptococcus pneumoniae bacteremia - no obvious pneumonia but because he is immune compromised secondary to malignancy and liver cirrhosis he is at risk of getting an encapsulated organism infection. Ceftriaxone is adequate to treat it. 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 ___________________________________________________ Discussed with patient, requesting provider

## 2017-12-15 NOTE — Progress Notes (Signed)
Initial Nutrition Assessment  DOCUMENTATION CODES:   Severe malnutrition in context of chronic illness  INTERVENTION:  Recommend liberalizing diet to 2 gram sodium.  Provide Carnation Instant Breakfast in WHOLE MILK TID with meals, each supplement provides 280 kcal and 13 grams of protein.  Provide daily MVI.  Reviewed "High Calorie, High Protein Nutrition Therapy" and "High Calorie, High Protein Recipies" handouts from the Academy of Nutrition and Dietetics. Encouraged intake of calorie- and protein-dense foods and beverages at meals. Provided patient with recipe for a homemade ONS that provides 416 kcal and 16.8 grams of protein per serving since he cannot afford Ensure (1 cup whole milk + 1/4 cup powdered milk + 2 tbsp chocolate syrup).  NUTRITION DIAGNOSIS:   Severe Malnutrition related to chronic illness(HCC, cirrhosis, COPD, CHF, EtOH abuse, substance abuse) as evidenced by severe fat depletion, severe muscle depletion.  GOAL:   Patient will meet greater than or equal to 90% of their needs  MONITOR:   PO intake, Supplement acceptance, Labs, Weight trends, I & O's  REASON FOR ASSESSMENT:   Malnutrition Screening Tool    ASSESSMENT:   62 year old male with PMHx of neuropathy, CHF, cocaine abuse, hx MI, COPD, anxiety, anorexia, arthritis, hepatitis B and C, cirrhosis of liver, HCC, GERD, tobacco abuse, EtOH abuse, CAD who is admitted with sepsis secondary to streptococcus pneumonia bacteremia, decompensated liver disease with ascites.   -Patient s/p US guided paracentesis yesterday that removed 5 L of fluid.  Met with patient at bedside. He reports a poor appetite for the past 2-3 months. He reports it is related to his ascites. He is experiencing anorexia (loss of hunger), early satiety, N/V, and abdominal discomfort. He did feel better after the paracentesis yesterday but the fluid is already building back up again and his abdomen is distended today. Patient reports that  for the week PTA he didn't have anything to eat. He is trying to eat here but it is taking him so long to eat that by the time he finishes what he can from a tray it is time to order the next meal. He reports he does not tolerate most oral nutrition supplements because they make him nauseas and he also cannot afford them at home. He is also frustrated that he cannot get ice cream or whole milk on his current diet.  Patient reports his UBW is 150 lbs (68.2 kg) but it has been fluctuating with lower extremity edema and his ascites. On 11/17 patient was 67.9 kg. RD obtained bed scale weight of 71.3 kg today.  Medications reviewed and include: gabapentin, octreotide 100 mcg TID, human albumin 25 grams daily IV, ceftriaxone.  Labs reviewed: Sodium 131, BUN 26.  Discussed with RN.  NUTRITION - FOCUSED PHYSICAL EXAM:    Most Recent Value  Orbital Region  Severe depletion  Upper Arm Region  Severe depletion  Thoracic and Lumbar Region  Unable to assess [distended from ascites]  Buccal Region  Severe depletion  Temple Region  Severe depletion  Clavicle Bone Region  Severe depletion  Clavicle and Acromion Bone Region  Severe depletion  Scapular Bone Region  Severe depletion  Dorsal Hand  Severe depletion  Patellar Region  Severe depletion  Anterior Thigh Region  Severe depletion  Posterior Calf Region  Severe depletion  Edema (RD Assessment)  Mild [bilateral lower extremities]  Hair  Reviewed  Eyes  Reviewed  Mouth  Reviewed  Skin  Reviewed  Nails  Reviewed     Diet Order:  Diet Order            Diet Heart Room service appropriate? Yes; Fluid consistency: Thin  Diet effective now             EDUCATION NEEDS:   Education needs have been addressed  Skin:  Skin Assessment: Reviewed RN Assessment  Last BM:  12/13/2017  Height:   Ht Readings from Last 1 Encounters:  12/13/17 6' (1.829 m)   Weight:   Wt Readings from Last 1 Encounters:  12/15/17 71.3 kg   Ideal Body  Weight:  80.9 kg  BMI:  Body mass index is 21.32 kg/m.  Estimated Nutritional Needs:   Kcal:  2000-2300 (MSJ x 1.3-1.5)  Protein:  100-115 grams (1.5-1.7 grams/kg)  Fluid:  1.5-1.8 L/day  Willey Blade, MS, RD, LDN Office: [phone] Pager: [phone] After Hours/Weekend Pager: [phone]

## 2017-12-15 NOTE — Consult Note (Signed)
[place]  Telephone:([phone] [phone]   Name: Phillip Warren Date: 12/15/2017 MRN: [id number]  DOB: [birth year]  Patient Care Team: Glendon Axe, MD as PCP - General (Internal Medicine) Wellington Hampshire, MD as PCP - Cardiology (Cardiology) Efrain Sella, MD as PCP - Gastroenterology (Gastroenterology) Clent Jacks, RN as Registered Nurse    REASON FOR CONSULTATION: Palliative Care consult requested for this 62 y.o. male with multiple medical problems including hepatocellular carcinoma initially diagnosed in 2016.  Patient underwent ablation and then was subsequently lost to follow-up.  PMH is also notable for history of cirrhosis, active hepatitis B and C, CAD, ischemic cardiomyopathy with EF of 25 to 30% per ECHO in February 2019, history of polysubstance abuse with alcohol/THC and IV drugs, history of GI bleed, and COPD.  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 Patient established care with oncology and was referred to a tertiary center for treatment of his HCV/HBV. Unfortunately, patient has not bee able to make these appointments. He is now admitted 12/13/17 with sepsis from SBP. He    SOCIAL HISTORY:    Patient is widowed.  His wife died of cancer.  He lives alone in an apartment.  Patient has a daughter who lives nearby and is involved in his care.  Patient has 2 sons and 2 daughters.  Patient used to work as a Curator.  ADVANCE DIRECTIVES:  Has a MOST form. His daughter would be his decision maker if needed.   CODE STATUS: DNR  PAST MEDICAL HISTORY: Past Medical History:  Diagnosis Date  . Alcohol abuse   . Anorexia    has trouble eating due to poor appetite  . Anxiety    trouble sleeping; lost two sisters and his mother close together  . Arthritis    hands, feet (burning, stinging)   . Cancer [facility])    liver lesion initial staging.  . CHF (congestive heart failure) ([place])   . Cirrhosis of liver ([place])   . Cocaine abuse ([place])   . COPD (chronic obstructive pulmonary disease) ([place])   . Coronary artery disease    Inferior ST elevation myocardial infarction in February 2019.  Cardiac catheterization showed subtotal occlusion of mid right coronary artery with severe disease affecting the LAD and left circumflex.  Successful PCI and 2 drug-eluting stent placement to the RCA.  He returned a few days after hospital discharge with stent thrombosis due to not taking any of his cardiac medications.   Marland Kitchen GERD (gastroesophageal reflux disease)   . H/O dizziness   . Headache   . Hepatitis    B and C (chronic)  . Marijuana use   . Myocardial infarction ([place])    massive MI at age 60  . Neuropathy   . Numbness and tingling    legs and feet bilat   . Pneumonia   . Repeated falls   . Shortness of breath dyspnea   . Tinnitus   . Tobacco abuse     PAST SURGICAL HISTORY:  Past Surgical History:  Procedure Laterality Date  . CARDIAC SURGERY    . COLONOSCOPY WITH PROPOFOL N/A 08/31/2014   Procedure: COLONOSCOPY WITH PROPOFOL;  Surgeon: Josefine Class, MD;  Location: [facility] ENDOSCOPY;  Service: Endoscopy;  Laterality: N/A;  . CORONARY ANGIOPLASTY     at age 80 secondary to massive MI  .  CORONARY/GRAFT ACUTE MI REVASCULARIZATION N/A 03/09/2017   Procedure: Coronary/Graft Acute MI Revascularization;  Surgeon: Wellington Hampshire, MD;  Location: [place] CV LAB;  Service: Cardiovascular;  Laterality: N/A;  . CORONARY/GRAFT ACUTE MI REVASCULARIZATION N/A 03/16/2017   Procedure: Coronary/Graft Acute MI Revascularization;  Surgeon: Wellington Hampshire, MD;  Location: [place] CV LAB;  Service: Cardiovascular;  Laterality: N/A;  balloon only RCA  . ESOPHAGOGASTRODUODENOSCOPY (EGD) WITH PROPOFOL N/A 08/31/2014   Procedure: ESOPHAGOGASTRODUODENOSCOPY (EGD) WITH PROPOFOL;  Surgeon: Josefine Class, MD;  Location: [facility] ENDOSCOPY;  Service: Endoscopy;  Laterality: N/A;  . ESOPHAGOGASTRODUODENOSCOPY (EGD) WITH PROPOFOL N/A 01/02/2017   Procedure: ESOPHAGOGASTRODUODENOSCOPY (EGD) WITH PROPOFOL;  Surgeon: Toledo, Benay Pike, MD;  Location: [facility] ENDOSCOPY;  Service: Gastroenterology;  Laterality: N/A;  . LEFT HEART CATH AND CORONARY ANGIOGRAPHY N/A 03/09/2017   Procedure: LEFT HEART CATH AND CORONARY ANGIOGRAPHY;  Surgeon: Wellington Hampshire, MD;  Location: [place] CV LAB;  Service: Cardiovascular;  Laterality: N/A;  . LEFT HEART CATH AND CORONARY ANGIOGRAPHY N/A 03/16/2017   Procedure: LEFT HEART CATH AND CORONARY ANGIOGRAPHY;  Surgeon: Wellington Hampshire, MD;  Location: [place] CV LAB;  Service: Cardiovascular;  Laterality: N/A;    HEMATOLOGY/ONCOLOGY HISTORY:   No history exists.    ALLERGIES:  is allergic to multihance [gadobenate]; hydrocodone; other; and vicodin [hydrocodone-acetaminophen].  MEDICATIONS:  Current Facility-Administered Medications  Medication Dose Route Frequency Provider Last Rate Last Dose  . albumin human 25 % solution 25 g  25 g Intravenous Daily Jonathon Bellows, MD      . cefTRIAXone (ROCEPHIN) 2 g in sodium chloride 0.9 % 100 mL IVPB  2 g Intravenous Q24H Saundra Shelling, MD 200 mL/hr at 12/15/17 1211 2 g at 12/15/17 1211  . diphenhydrAMINE (BENADRYL) capsule 25 mg  25 mg Oral Q8H PRN Saundra Shelling, MD   25 mg at 12/15/17 1301  . gabapentin (NEURONTIN) capsule 800 mg  800 mg Oral QID Lance Coon, MD   800 mg at 12/15/17 1301  . midodrine (PROAMATINE) tablet 5 mg  5 mg Oral TID WC Jonathon Bellows, MD      . Derrill Memo ON 12/16/2017] multivitamin with minerals tablet 1 tablet  1 tablet Oral Daily Pyreddy, Pavan, MD      . octreotide (SANDOSTATIN) injection 100 mcg  100 mcg Subcutaneous TID Jonathon Bellows, MD      . ondansetron [facility]) tablet 4 mg  4 mg Oral Q6H PRN Lance Coon, MD       Or  . ondansetron [facility]) injection 4 mg  4 mg Intravenous Q6H PRN  Lance Coon, MD   4 mg at 12/15/17 1454  . oxyCODONE (Oxy IR/ROXICODONE) immediate release tablet 10 mg  10 mg Oral Q6H PRN Lance Coon, MD   10 mg at 12/15/17 1427  . phytonadione (VITAMIN K) SQ injection 10 mg  10 mg Subcutaneous Daily Jonathon Bellows, MD        VITAL SIGNS: BP 112/84 (BP Location: Left Arm)   Pulse (!) 120   Temp 98.1 F (36.7 C) (Oral)   Resp 18   Ht 6' (1.829 m)   Wt 157 lb 3 oz (71.3 kg) Comment: bed scale  SpO2 94%   BMI 21.32 kg/m  Filed Weights   12/13/17 2040 12/15/17 1450  Weight: 149 lb 9.6 oz (67.9 kg) 157 lb 3 oz (71.3 kg)    Estimated body mass index is 21.32 kg/m as calculated from the following:   Height as of this encounter: 6' (  1.829 m).   Weight as of this encounter: 157 lb 3 oz (71.3 kg).  LABS: CBC:    Component Value Date/Time   WBC 8.4 12/15/2017 0344   HGB 10.4 (L) 12/15/2017 0344   HGB 16.4 05/14/2014 1606   HCT 31.7 (L) 12/15/2017 0344   HCT 49.7 05/14/2014 1606   PLT 121 (L) 12/15/2017 0344   PLT 111 (L) 05/14/2014 1606   MCV 83.4 12/15/2017 0344   MCV 96 05/14/2014 1606   NEUTROABS 15.0 (H) 12/13/2017 2051   NEUTROABS 3.4 05/14/2014 1606   LYMPHSABS 1.3 12/13/2017 2051   LYMPHSABS 1.8 05/14/2014 1606   MONOABS 0.8 12/13/2017 2051   MONOABS 0.5 05/14/2014 1606   EOSABS 0.0 12/13/2017 2051   EOSABS 0.2 05/14/2014 1606   BASOSABS 0.1 12/13/2017 2051   BASOSABS 0.0 05/14/2014 1606   Comprehensive Metabolic Panel:    Component Value Date/Time   NA 131 (L) 12/15/2017 0344   NA 135 05/14/2014 1606   K 4.2 12/15/2017 0344   K 4.2 05/14/2014 1606   CL 99 12/15/2017 0344   CL 99 (L) 05/14/2014 1606   CO2 23 12/15/2017 0344   CO2 26 05/14/2014 1606   BUN 26 (H) 12/15/2017 0344   BUN 17 05/14/2014 1606   CREATININE 1.00 12/15/2017 0344   CREATININE 1.14 05/14/2014 1606   GLUCOSE 147 (H) 12/15/2017 0344   GLUCOSE 109 (H) 05/14/2014 1606   CALCIUM 9.1 12/15/2017 0344   CALCIUM 9.0 05/14/2014 1606   AST 190 (H)  12/13/2017 2051   AST 162 (H) 05/14/2014 1606   ALT 36 12/13/2017 2051   ALT 184 (H) 05/14/2014 1606   ALKPHOS 175 (H) 12/13/2017 2051   ALKPHOS 101 05/14/2014 1606   BILITOT 9.0 (H) 12/13/2017 2051   BILITOT 0.5 07/24/2017 1203   BILITOT 0.7 05/14/2014 1606   PROT 8.1 12/13/2017 2051   PROT 7.7 07/24/2017 1203   PROT 8.8 (H) 05/14/2014 1606   ALBUMIN 2.6 (L) 12/13/2017 2051   ALBUMIN 3.8 07/24/2017 1203   ALBUMIN 3.9 05/14/2014 1606    RADIOGRAPHIC STUDIES: Dg Chest 2 View  Result Date: 12/13/2017 CLINICAL DATA:  Leg swelling for 3-4 days.  Abdominal pain. EXAM: CHEST - 2 VIEW COMPARISON:  10/22/2017 FINDINGS: AP and lateral views of the chest show low volumes. There is basilar atelectasis bilaterally, left greater than right. The cardiopericardial silhouette is within normal limits for size. Bones are diffusely demineralized. Telemetry leads overlie the chest. IMPRESSION: Basilar atelectasis, left greater than right. Electronically Signed   By: Misty Stanley M.D.   On: 12/13/2017 21:36   Ct Chest Wo Contrast  Result Date: 11/23/2017 CLINICAL DATA:  Recurrent hepatocellular carcinoma. Staging. Chest pain and shortness of breath. Recent cardiac stent. Smoker. EXAM: CT CHEST WITHOUT CONTRAST TECHNIQUE: Multidetector CT imaging of the chest was performed following the standard protocol without IV contrast. COMPARISON:  Abdominal MRI 10/22/2017. Chest radiograph 10/22/2017. Most recent chest CT 08/30/2014. FINDINGS: Cardiovascular: Aortic and branch vessel atherosclerosis. Normal heart size, without pericardial effusion. Multivessel coronary artery atherosclerosis. Mediastinum/Nodes: No supraclavicular adenopathy. No mediastinal or definite hilar adenopathy, given limitations of unenhanced CT. Lungs/Pleura: No pleural fluid. Lower lobe predominant bronchial wall thickening. Minimal motion degradation in the lower chest. Mild centrilobular and paraseptal emphysema. Bibasilar scarring or  subsegmental atelectasis. Interstitial thickening in the posterior left upper lobe with adjacent left major fissure thickening, similar including on image 53/3 indicative of scarring. Minimal biapical pleuroparenchymal scarring. Suspect a 4 mm right middle lobe pulmonary nodule  on image 87/3. Not readily apparent on the prior exam, possibly due to thinner slice collimation today. Upper Abdomen: Cirrhosis. Abdominal ascites. Suboptimal visualization of previously described dominant hepatic mass, secondary to noncontrast technique. Grossly normal imaged portions of the adrenal glands, left kidney, spleen, stomach. Musculoskeletal: No acute osseous abnormality. Mild right hemidiaphragm elevation. IMPRESSION: 1.  No acute process or evidence of metastatic disease in the chest. 2. Right middle lobe isolated 4 mm nodule, not readily apparent on the prior. Recommend attention on follow-up. 3. Aortic atherosclerosis (ICD10-I70.0), coronary artery atherosclerosis and emphysema (ICD10-J43.9). Electronically Signed   By: Abigail Miyamoto M.D.   On: 11/23/2017 16:06   US Paracentesis  Result Date: 12/14/2017 INDICATION: Hepatocellular carcinoma and refractory ascites. EXAM: ULTRASOUND GUIDED PARACENTESIS MEDICATIONS: None. COMPLICATIONS: None immediate. PROCEDURE: Informed written consent was obtained from the patient after a discussion of the risks, benefits and alternatives to treatment. A timeout was performed prior to the initiation of the procedure. Initial ultrasound was performed to localize ascites. The right lower abdomen was prepped and draped in the usual sterile fashion. 1% lidocaine was used for local anesthesia. Following this, a 6 Fr Safe-T-Centesis catheter was introduced. An ultrasound image was saved for documentation purposes. The paracentesis was performed. The catheter was removed and a dressing was applied. The patient tolerated the procedure well without immediate post procedural complication. FINDINGS:  A total of approximately 5 L of yellowish fluid was removed. IMPRESSION: Successful ultrasound-guided paracentesis yielding 5 liters of peritoneal fluid. Electronically Signed   By: Aletta Edouard M.D.   On: 12/14/2017 14:05   US Paracentesis  Result Date: 11/24/2017 INDICATION: Ascites EXAM: ULTRASOUND GUIDED  PARACENTESIS MEDICATIONS: None. COMPLICATIONS: None immediate. PROCEDURE: Informed written consent was obtained from the patient after a discussion of the risks, benefits and alternatives to treatment. A timeout was performed prior to the initiation of the procedure. Initial ultrasound scanning demonstrates a large amount of ascites within the right lower abdominal quadrant. The right lower abdomen was prepped and draped in the usual sterile fashion. 1% lidocaine with epinephrine was used for local anesthesia. Following this, a 6 Fr Safe-T-Centesis catheter was introduced. An ultrasound image was saved for documentation purposes. The paracentesis was performed. The catheter was removed and a dressing was applied. The patient tolerated the procedure well without immediate post procedural complication. FINDINGS: A total of approximately 4.4 L of clear yellow fluid was removed. IMPRESSION: Successful ultrasound-guided paracentesis yielding 4.4 liters of peritoneal fluid. Electronically Signed   By: Marybelle Killings M.D.   On: 11/24/2017 14:28   US Paracentesis  Result Date: 11/16/2017 INDICATION: History infiltrative hepatocellular carcinoma and malignant portal vein thrombosis, now with symptomatic intra-abdominal ascites. Please perform ultrasound-guided paracentesis for both therapeutic and diagnostic purposes. EXAM: ULTRASOUND-GUIDED PARACENTESIS COMPARISON:  MRCP-10/22/2017 MEDICATIONS: None. COMPLICATIONS: None immediate. TECHNIQUE: Informed written consent was obtained from the patient after a discussion of the risks, benefits and alternatives to treatment. A timeout was performed prior to the  initiation of the procedure. Initial ultrasound scanning demonstrates a moderate to large amount of ascites within the right lower abdominal quadrant. The right lower abdomen was prepped and draped in the usual sterile fashion. 1% lidocaine with epinephrine was used for local anesthesia. An ultrasound image was saved for documentation purposed. An 8 Fr Safe-T-Centesis catheter was introduced. The paracentesis was performed. The catheter was removed and a dressing was applied. The patient tolerated the procedure well without immediate post procedural complication. FINDINGS: A total of approximately 6.9 liters of serous fluid  was removed. Samples were sent to the laboratory as requested by the clinical team. IMPRESSION: Successful ultrasound-guided paracentesis yielding 6.9 liters of peritoneal fluid. Electronically Signed   By: Sandi Mariscal M.D.   On: 11/16/2017 11:55    PERFORMANCE STATUS (ECOG) : 3 - Symptomatic, >50% confined to bed  Review of Systems As noted above. Otherwise, a complete review of systems is negative.  Physical Exam General: NAD, frail appearing, thin, severe bilateral temporal wasting Pulmonary: unlabored Abdomen: distended Extremities: LE edema Skin: no rashes Neurological: Weakness but otherwise nonfocal  IMPRESSION: Patient is familiar to me from the outpatient clinic. His outpatient care has been complicated by limited social support and a lack of transportation to a tertiary center to receive treatment of his cirrhosis and concurrent hepatitis infections. Subsequently, patient has not been a treatment candidate for his [place]. Patient is a Child-Pugh Grade C (12 points).   He is s/p large volume paracentesis on 11/18 with 5L removed. He is being treated for SBP and will likely need chronic prophylaxis antibiotic. Patient appears to already have recurrence of ascites with worsening abdominal distension. I note weight was 149lbs on admission and is up to 157lbs today. Would  patient benefit from Tenckhoff catheter following resolution of his current infection?  This would certainly place him at increased risk of recurrent infection but might also help improve his comfort, allowing for home management of his ascites.  Patient describes a slow decline with worsening weakness. At baseline, he lives at home alone but his daughter lives nearby. I had an open conversation with patient about his declining status and poor prognosis. We discussed the factors limiting his treatment. We discussed the option of involving hospice at home. Patient says he is familiar with hospice from the care of his mother. Patient says he would agree with hospice care at home. We also talked about the option of the Hospice Home in the future if needed. Although his daughter is involved in his care, I worry that his care requirements might be too great to manage in the home.   We discussed code status. I had previously completed a MOST form in the clinic but this was not brought to the hospital. Patient confirmed desire not to be resuscitated or have his life prolonged artificially. Will place order for DNR.   Case discussed with Dr. Janese Banks.   PLAN: Continue medical treatment DNR CM referral to arrange hospice at home   Time Total: 60 minutes  Visit consisted of counseling and education dealing with the complex and emotionally intense issues of symptom management and palliative care in the setting of serious and potentially life-threatening illness.Greater than 50%  of this time was spent counseling and coordinating care related to the above assessment and plan.  Signed by: Altha Harm, PhD, DNP, NP-C, [facility] [phone] (Work Cell)

## 2017-12-15 NOTE — Consult Note (Signed)
Hematology/Oncology Consult note [facility] Telephone:([phone] [phone]  Patient Care Team: Glendon Axe, MD as PCP - General (Internal Medicine) Wellington Hampshire, MD as PCP - Cardiology (Cardiology) Efrain Sella, MD as PCP - Gastroenterology (Gastroenterology) Clent Jacks, RN as Registered Nurse   Name of the patient: Phillip Warren  [id number]  Feb 04, 1955    Reason for consult: [facility]   Requesting physician: Dr. Estanislado Pandy  Date of visit: 12/15/2017    History of presenting illness- Patient is a 62 yr old male with chronic liver disease, cirrhosis, active Hep B and Hep C and locally advanced HCC. He sees me as an outpatient but has not started any treatment yet as he has not received any treatment for Hep B and Hep C yet. He has been having recurrent episodes of decompensated ascites, worsening lower extremity swelling over last 2 months. See my outpatient note for complete details  He was admitted for worsening abdominal pain and ascites and also noted to have strep pneumoniae bacteremia. Oncology consulted for goals of care  He is frail and extremely cachectic. Lives alone and his daughter lives across the street. Appetite is poor with ongoing weight loss  ECOG PS- 3  Pain scale- 4   Review of systems- Review of Systems  Constitutional: Positive for malaise/fatigue and weight loss.  Cardiovascular: Positive for leg swelling.  Gastrointestinal: Positive for abdominal pain.       Abdominal distension    Allergies  Allergen Reactions  . Multihance [Gadobenate] Hives and Shortness Of Breath  . Hydrocodone Itching and Nausea And Vomiting  . Other Itching    multihance MRI dye  . Vicodin [Hydrocodone-Acetaminophen] Nausea Only and Nausea And Vomiting    Vomiting     Patient Active Problem List   Diagnosis Date Noted  . Protein-calorie malnutrition, severe 12/15/2017  . Sepsis ([place]) 12/13/2017  . Abdominal pain  10/22/2017  . Cholelithiasis   . COPD (chronic obstructive pulmonary disease) ([place]) 03/09/2017  . GERD (gastroesophageal reflux disease) 03/09/2017  . Alcohol abuse 03/09/2017  . History of GI bleed 03/09/2017  . Acute ST elevation myocardial infarction (STEMI) of inferior wall ([place]) 03/09/2017  . Acute ST elevation myocardial infarction (STEMI) involving right coronary artery ([place])   . GI bleed 01/02/2017  . Hepatic cirrhosis ([place])   . Hepatocellular carcinoma ([place])   . [place] (hepatocellular carcinoma) ([place])   . Hepatitis, viral   . Cancer, hepatocellular ([place]) 08/31/2014  . Absolute anemia 08/26/2014  . Cirrhosis ([place]) 08/26/2014  . Arteriosclerosis of coronary artery 08/26/2014  . HBV (hepatitis B virus) infection 08/26/2014  . HCV (hepatitis C virus) 08/26/2014  . Chronic hepatitis C ([place]) 04/13/2014  . Foot pain 04/13/2014     Past Medical History:  Diagnosis Date  . Alcohol abuse   . Anorexia    has trouble eating due to poor appetite  . Anxiety    trouble sleeping; lost two sisters and his mother close together  . Arthritis    hands, feet (burning, stinging)  . Cancer [facility])    liver lesion initial staging.  . CHF (congestive heart failure) ([place])   . Cirrhosis of liver ([place])   . Cocaine abuse ([place])   . COPD (chronic obstructive pulmonary disease) ([place])   . Coronary artery disease    Inferior ST elevation myocardial infarction in February 2019.  Cardiac catheterization showed subtotal occlusion of mid right coronary artery with severe disease affecting the LAD and left circumflex.  Successful  PCI and 2 drug-eluting stent placement to the RCA.  He returned a few days after hospital discharge with stent thrombosis due to not taking any of his cardiac medications.   Mar[place] Kitchen GERD (gastroesophageal reflux disease)   . H/O dizziness   . Headache   . Hepatitis    B and C (chronic)  . Marijuana use   . Myocardial infarction ([place])    massive MI at age 22  . Neuropathy   .  Numbness and tingling    legs and feet bilat   . Pneumonia   . Repeated falls   . Shortness of breath dyspnea   . Tinnitus   . Tobacco abuse      Past Surgical History:  Procedure Laterality Date  . CARDIAC SURGERY    . COLONOSCOPY WITH PROPOFOL N/A 08/31/2014   Procedure: COLONOSCOPY WITH PROPOFOL;  Surgeon: Josefine Class, MD;  [facility] END[facility];  Service: Endoscopy;  Laterality: N/A;  . CORONARY ANGIOPLASTY     at age 77 secondary to massive MI  . CORONARY/GRAFT ACUTE MI REVASCULARIZATION N/A 03/09/2017   Procedure: Coronary/Graft Acute MI Revascularization;  Surgeon: Wellington Hampshire, MD;  Location: [place] CV LAB;  Service: Cardiovascular;  Laterality: N/A;  . CORONARY/GRAFT ACUTE MI REVASCULARIZATION N/A 03/16/2017   Procedure: Coronary/Graft Acute MI Revascularization;  Surgeon: Wellington Hampshire, MD;  Location: [place] CV LAB;  Service: Cardiovascular;  Laterality: N/A;  balloon only RCA  . ESOPHAGOGASTRODUODENOSCOPY (EGD) WITH PROPOFOL N/A 08/31/2014   Procedure: ESOPHAGOGASTRODUODENOSCOPY (EGD) WITH PROPOFOL;  Surgeon: Josefine Class, MD;  Location: [facility] ENDOSCOPY;  Service: Endoscopy;  Laterality: N/A;  . ESOPHAGOGASTRODUODENOSCOPY (EGD) WITH PROPOFOL N/A 01/02/2017   Procedure: ESOPHAGOGASTRODUODENOSCOPY (EGD) WITH PROPOFOL;  Surgeon: [place], Benay Pike, MD;  Location: [facility] ENDOSCOPY;  Service: Gastroenterology;  Laterality: N/A;  . LEFT HEART CATH AND CORONARY ANGIOGRAPHY N/A 03/09/2017   Procedure: LEFT HEART CATH AND CORONARY ANGIOGRAPHY;  Surgeon: Wellington Hampshire, MD;  Location: [place] CV LAB;  Service: Cardiovascular;  Laterality: N/A;  . LEFT HEART CATH AND CORONARY ANGIOGRAPHY N/A 03/16/2017   Procedure: LEFT HEART CATH AND CORONARY ANGIOGRAPHY;  Surgeon: Wellington Hampshire, MD;  Location: [place] CV LAB;  Service: Cardiovascular;  Laterality: N/A;    Social History   Socioeconomic History  . Marital status: Widowed    Spouse  name: Not on file  . Number of children: Not on file  . Years of education: Not on file  . Highest education level: Not on file  Occupational History  . Not on file  Social Needs  . Financial resource strain: Not on file  . Food insecurity:    Worry: Not on file    Inability: Not on file  . Transportation needs:    Medical: Not on file    Non-medical: Not on file  Tobacco Use  . Smoking status: Current Every Day Smoker    Packs/day: 0.50    Years: 30.00    Pack years: 15.00    Types: Cigarettes  . Smokeless tobacco: Never Used  Substance and Sexual Activity  . Alcohol use: Yes    Frequency: Never    Comment: "very little"  . Drug use: Yes    Types: Marijuana    Comment: pt states has not used cocaine in 20 years   . Sexual activity: Not on file  Lifestyle  . Physical activity:    Days per week: Not on file    Minutes per session: Not on  file  . Stress: Not on file  Relationships  . Social connections:    Talks on phone: Not on file    Gets together: Not on file    Attends religious service: Not on file    Active member of club or organization: Not on file    Attends meetings of clubs or organizations: Not on file    Relationship status: Not on file  . Intimate partner violence:    Fear of current or ex partner: Not on file    Emotionally abused: Not on file    Physically abused: Not on file    Forced sexual activity: Not on file  Other Topics Concern  . Not on file  Social History Narrative  . Not on file     Family History  Problem Relation Age of Onset  . CAD Father   . Lung cancer Sister   . CAD Brother   . Heart disease Brother        CABG x 3  . Heart attack Brother        nine heart attacks  . Prostate cancer Neg Hx   . Bladder Cancer Neg Hx   . Kidney cancer Neg Hx      Current Facility-Administered Medications:  .  albumin human 25 % solution 25 g, 25 g, Intravenous, Daily, Jonathon Bellows, MD .  cefTRIAXone (ROCEPHIN) 2 g in sodium chloride  0.9 % 100 mL IVPB, 2 g, Intravenous, Q24H, Pyreddy, Pavan, MD, Last Rate: 200 mL/hr at 12/15/17 1211, 2 g at 12/15/17 1211 .  diphenhydrAMINE (BENADRYL) capsule 25 mg, 25 mg, Oral, Q8H PRN, Pyreddy, Pavan, MD, 25 mg at 12/15/17 1301 .  gabapentin (NEURONTIN) capsule 800 mg, 800 mg, Oral, QID, Lance Coon, MD, 800 mg at 12/15/17 1301 .  midodrine (PROAMATINE) tablet 5 mg, 5 mg, Oral, TID WC, Jonathon Bellows, MD .  Derrill Memo ON 12/16/2017] multivitamin with minerals tablet 1 tablet, 1 tablet, Oral, Daily, Pyreddy, Pavan, MD .  octreotide (SANDOSTATIN) injection 100 mcg, 100 mcg, Subcutaneous, TID, Jonathon Bellows, MD .  ondansetron [facility]) tablet 4 mg, 4 mg, Oral, Q6H PRN **OR** ondansetron (ZOFRAN) injection 4 mg, 4 mg, Intravenous, Q6H PRN, Lance Coon, MD, 4 mg at 12/15/17 1454 .  oxyCODONE (Oxy IR/ROXICODONE) immediate release tablet 10 mg, 10 mg, Oral, Q6H PRN, Lance Coon, MD, 10 mg at 12/15/17 1427 .  phytonadione (VITAMIN K) SQ injection 10 mg, 10 mg, Subcutaneous, Daily, Jonathon Bellows, MD   Physical exam:  Vitals:   12/14/17 2340 12/15/17 0727 12/15/17 1450 12/15/17 1607  BP: 120/66 110/70  112/84  Pulse: (!) 115 (!) 110  (!) 120  Resp: 17 16  18   Temp: 98.5 F (36.9 C) 98.1 F (36.7 C)  98.1 F (36.7 C)  TempSrc: Oral Oral  Oral  SpO2: 93% 92%  94%  Weight:   157 lb 3 oz (71.3 kg)   Height:       Physical Exam  Constitutional: He is oriented to person, place, and time.  Thin frail cachectic male who appears fatigued  HENT:  Head: Normocephalic and atraumatic.  Eyes: Pupils are equal, round, and reactive to light. EOM are normal.  Neck: Normal range of motion.  Cardiovascular: Normal rate, regular rhythm and normal heart sounds.  Pulmonary/Chest: Effort normal and breath sounds normal.  Abdominal: He exhibits distension.  Firm. Ascites +  Musculoskeletal: He exhibits edema.  Neurological: He is alert and oriented to person, place, and time.  Skin: Skin  is warm and dry.        CMP Latest Ref Rng & Units 12/15/2017  Glucose 70 - 99 mg/dL 147(H)  BUN 6 - 20 mg/dL 26(H)  Creatinine 0.61 - 1.24 mg/dL 1.00  Sodium 135 - 145 mmol/L 131(L)  Potassium 3.5 - 5.1 mmol/L 4.2  Chloride 98 - 111 mmol/L 99  CO2 22 - 32 mmol/L 23  Calcium 8.9 - 10.3 mg/dL 9.1  Total Protein 6.5 - 8.1 g/dL -  Total Bilirubin 0.3 - 1.2 mg/dL -  Alkaline Phos 38 - 126 U/L -  AST 15 - 41 U/L -  ALT 0 - 44 U/L -   CBC Latest Ref Rng & Units 12/15/2017  WBC 4.0 - 10.5 K/uL 8.4  Hemoglobin 13.0 - 17.0 g/dL 10.4(L)  Hematocrit 39.0 - 52.0 % 31.7(L)  Platelets 150 - 400 K/uL 121(L)    @IMAGES @  Dg Chest 2 View  Result Date: 12/13/2017 CLINICAL DATA:  Leg swelling for 3-4 days.  Abdominal pain. EXAM: CHEST - 2 VIEW COMPARISON:  10/22/2017 FINDINGS: AP and lateral views of the chest show low volumes. There is basilar atelectasis bilaterally, left greater than right. The cardiopericardial silhouette is within normal limits for size. Bones are diffusely demineralized. Telemetry leads overlie the chest. IMPRESSION: Basilar atelectasis, left greater than right. Electronically Signed   By: Misty Stanley M.D.   On: 12/13/2017 21:36   Ct Chest Wo Contrast  Result Date: 11/23/2017 CLINICAL DATA:  Recurrent hepatocellular carcinoma. Staging. Chest pain and shortness of breath. Recent cardiac stent. Smoker. EXAM: CT CHEST WITHOUT CONTRAST TECHNIQUE: Multidetector CT imaging of the chest was performed following the standard protocol without IV contrast. COMPARISON:  Abdominal MRI 10/22/2017. Chest radiograph 10/22/2017. Most recent chest CT 08/30/2014. FINDINGS: Cardiovascular: Aortic and branch vessel atherosclerosis. Normal heart size, without pericardial effusion. Multivessel coronary artery atherosclerosis. Mediastinum/Nodes: No supraclavicular adenopathy. No mediastinal or definite hilar adenopathy, given limitations of unenhanced CT. Lungs/Pleura: No pleural fluid. Lower lobe predominant  bronchial wall thickening. Minimal motion degradation in the lower chest. Mild centrilobular and paraseptal emphysema. Bibasilar scarring or subsegmental atelectasis. Interstitial thickening in the posterior left upper lobe with adjacent left major fissure thickening, similar including on image 53/3 indicative of scarring. Minimal biapical pleuroparenchymal scarring. Suspect a 4 mm right middle lobe pulmonary nodule on image 87/3. Not readily apparent on the prior exam, possibly due to thinner slice collimation today. Upper Abdomen: Cirrhosis. Abdominal ascites. Suboptimal visualization of previously described dominant hepatic mass, secondary to noncontrast technique. Grossly normal imaged portions of the adrenal glands, left kidney, spleen, stomach. Musculoskeletal: No acute osseous abnormality. Mild right hemidiaphragm elevation. IMPRESSION: 1.  No acute process or evidence of metastatic disease in the chest. 2. Right middle lobe isolated 4 mm nodule, not readily apparent on the prior. Recommend attention on follow-up. 3. Aortic atherosclerosis (ICD10-I70.0), coronary artery atherosclerosis and emphysema (ICD10-J43.9). Electronically Signed   By: Abigail Miyamoto M.D.   On: 11/23/2017 16:06   US Paracentesis  Result Date: 12/14/2017 INDICATION: Hepatocellular carcinoma and refractory ascites. EXAM: ULTRASOUND GUIDED PARACENTESIS MEDICATIONS: None. COMPLICATIONS: None immediate. PROCEDURE: Informed written consent was obtained from the patient after a discussion of the risks, benefits and alternatives to treatment. A timeout was performed prior to the initiation of the procedure. Initial ultrasound was performed to localize ascites. The right lower abdomen was prepped and draped in the usual sterile fashion. 1% lidocaine was used for local anesthesia. Following this, a 6 Fr Safe-T-Centesis catheter was introduced. An ultrasound image  was saved for documentation purposes. The paracentesis was performed. The  catheter was removed and a dressing was applied. The patient tolerated the procedure well without immediate post procedural complication. FINDINGS: A total of approximately 5 L of yellowish fluid was removed. IMPRESSION: Successful ultrasound-guided paracentesis yielding 5 liters of peritoneal fluid. Electronically Signed   By: Aletta Edouard M.D.   On: 12/14/2017 14:05   US Paracentesis  Result Date: 11/24/2017 INDICATION: Ascites EXAM: ULTRASOUND GUIDED  PARACENTESIS MEDICATIONS: None. COMPLICATIONS: None immediate. PROCEDURE: Informed written consent was obtained from the patient after a discussion of the risks, benefits and alternatives to treatment. A timeout was performed prior to the initiation of the procedure. Initial ultrasound scanning demonstrates a large amount of ascites within the right lower abdominal quadrant. The right lower abdomen was prepped and draped in the usual sterile fashion. 1% lidocaine with epinephrine was used for local anesthesia. Following this, a 6 Fr Safe-T-Centesis catheter was introduced. An ultrasound image was saved for documentation purposes. The paracentesis was performed. The catheter was removed and a dressing was applied. The patient tolerated the procedure well without immediate post procedural complication. FINDINGS: A total of approximately 4.4 L of clear yellow fluid was removed. IMPRESSION: Successful ultrasound-guided paracentesis yielding 4.4 liters of peritoneal fluid. Electronically Signed   By: Marybelle Killings M.D.   On: 11/24/2017 14:28   US Paracentesis  Result Date: 11/16/2017 INDICATION: History infiltrative hepatocellular carcinoma and malignant portal vein thrombosis, now with symptomatic intra-abdominal ascites. Please perform ultrasound-guided paracentesis for both therapeutic and diagnostic purposes. EXAM: ULTRASOUND-GUIDED PARACENTESIS COMPARISON:  MRCP-10/22/2017 MEDICATIONS: None. COMPLICATIONS: None immediate. TECHNIQUE: Informed written  consent was obtained from the patient after a discussion of the risks, benefits and alternatives to treatment. A timeout was performed prior to the initiation of the procedure. Initial ultrasound scanning demonstrates a moderate to large amount of ascites within the right lower abdominal quadrant. The right lower abdomen was prepped and draped in the usual sterile fashion. 1% lidocaine with epinephrine was used for local anesthesia. An ultrasound image was saved for documentation purposed. An 8 Fr Safe-T-Centesis catheter was introduced. The paracentesis was performed. The catheter was removed and a dressing was applied. The patient tolerated the procedure well without immediate post procedural complication. FINDINGS: A total of approximately 6.9 liters of serous fluid was removed. Samples were sent to the laboratory as requested by the clinical team. IMPRESSION: Successful ultrasound-guided paracentesis yielding 6.9 liters of peritoneal fluid. Electronically Signed   By: Sandi Mariscal M.D.   On: 11/16/2017 11:55    Assessment and plan- Patient is a 62 y.o. male with Cirrhosis, active Hep B and Hep C and locally advanced unresectable [place]  Patient has been failing recently. His ascites is getting worse with frequent decompensation episodes. He has severe protein calorie malnutrition and ongoing SBP and likely hepatorenal syndrome also carry increased risk of mortality  Treating his Hep B and Hep C in the present scenario is going to be difficult and in the light of declining performance status, he would not be a candidate for [facility] treatment as well. His overall prognosis is likely <3 months and hospice care would be the most reasonable approach  Patient has been going back and forth on his decision making. During my visit with me he tells me he is ready for home hospice. He is already a DNR. Recommend hospice informational meeting. NP Altha Harm to further assist with goals of care. Tenchkoff catheter for  palliation may be beneficial prior to  discharge.     Visit Diagnosis 1. Generalized abdominal pain   2. Leg edema   3. Elevated lactic acid level   4. Ascites     Dr. Randa Evens, MD, MPH [facility] at [facility] [phone] 12/15/2017  4:30 PM

## 2017-12-15 NOTE — Care Management (Addendum)
RNCM met with patient to present hospice agency choice. He is familiar with [facility] and says it is close to his home. He said he is not ready to decide at this time.  Hospice list left with patient.  Patient also mentioned the potential need for a hospital bed.  RNCM will follow. Patient is a full-code at this time. Update: RNCM and Palliative met with patient again regarding hospice at home. Patient appears to be struggling with this difficult decision. RNCM agreed to follow up with patient tomorrow.

## 2017-12-16 ENCOUNTER — Inpatient Hospital Stay
Admit: 2017-12-16 | Discharge: 2017-12-16 | Disposition: A | Discharge status: Home / Self Care | Payer: Medicare Other | Attending: Infectious Diseases | Admitting: Infectious Diseases

## 2017-12-16 LAB — CULTURE, BLOOD (ROUTINE X 2): SPECIAL REQUESTS: ADEQUATE

## 2017-12-16 LAB — TRIGLYCERIDES, BODY FLUIDS: TRIGLYCERIDES FL: 17 mg/dL

## 2017-12-16 LAB — CYTOLOGY - NON PAP

## 2017-12-16 LAB — PH, BODY FLUID: pH, Body Fluid: 7.4

## 2017-12-16 MED ORDER — PERFLUTREN LIPID MICROSPHERE
1.0000 mL | INTRAVENOUS | Status: DC | PRN
  Administered 2017-12-16: 3 mL via INTRAVENOUS

## 2017-12-16 MED ORDER — SALINE SPRAY 0.65 % NA SOLN
1.0000 | NASAL | Status: DC | PRN
  Filled 2017-12-16 (×2): qty 44

## 2017-12-16 NOTE — Care Management (Addendum)
Referral to [facility]. Patient may also need a hospital bed although he has not decided due to the need for rearrangement of furniture in his home. Daughter is now involved in his care and decision making.

## 2017-12-16 NOTE — Care Management (Signed)
RNCM met with patient this AM. He states he has talked with his brother and he "is ready to get on up out of here".  I asked him if he would allow me to set up hospice and he said "not right now".  I asked if he was interested in home health services and he said "I haven't put much thought into that but I will let you know".  Patient seems to be struggling with the thought of dying and states he feels stronger today.

## 2017-12-16 NOTE — Progress Notes (Addendum)
[place] [facility] [place] NAME: Phillip Warren    MR#:  [id number]  DATE OF BIRTH:  1955-06-27  SUBJECTIVE:  CHIEF COMPLAINT:   Chief Complaint  Patient presents with  . Abdominal Pain  . Leg Swelling  Seen and evaluated today Has abdominal discomfort Has generalized weakness No fever, chest pain  swelling in the lower extremities better REVIEW OF SYSTEMS:    ROS  CONSTITUTIONAL: No documented fever. Has fatigue, weakness. No weight gain, no weight loss.  EYES: No blurry or double vision.  ENT: No tinnitus. No postnasal drip. No redness of the oropharynx.  RESPIRATORY: No cough, no wheeze, no hemoptysis. No dyspnea.  CARDIOVASCULAR: No chest pain. No orthopnea. No palpitations. No syncope.  GASTROINTESTINAL: No nausea, no vomiting or diarrhea. Has mild abdominal pain. No melena or hematochezia.  Has abdominal distention GENITOURINARY: No dysuria or hematuria.  ENDOCRINE: No polyuria or nocturia. No heat or cold intolerance.  HEMATOLOGY: No anemia. No bruising. No bleeding.  INTEGUMENTARY: No rashes. No lesions.  MUSCULOSKELETAL: No arthritis. Has swelling in the lower extremities. No gout.  NEUROLOGIC: No numbness, tingling, or ataxia. No seizure-type activity.  PSYCHIATRIC: No anxiety. No insomnia. No ADD.   DRUG ALLERGIES:   Allergies  Allergen Reactions  . Multihance [Gadobenate] Hives and Shortness Of Breath  . Hydrocodone Itching and Nausea And Vomiting  . Other Itching    multihance MRI dye  . Vicodin [Hydrocodone-Acetaminophen] Nausea Only and Nausea And Vomiting    Vomiting     VITALS:  Blood pressure 124/82, pulse (!) 111, temperature 98.5 F (36.9 C), temperature source Oral, resp. rate 18, height 6' (1.829 m), weight 71.3 kg, SpO2 93 %.  PHYSICAL EXAMINATION:   Physical Exam  GENERAL:  62 y.o.-year-old patient lying in the bed with no acute distress.  EYES: Pupils equal, round, reactive to light and  accommodation. No scleral icterus. Extraocular muscles intact.  HEENT: Head atraumatic, normocephalic. Oropharynx and nasopharynx clear.  NECK:  Supple, no jugular venous distention. No thyroid enlargement, no tenderness.  LUNGS: Normal breath sounds bilaterally, no wheezing, rales, rhonchi. No use of accessory muscles of respiration.  CARDIOVASCULAR: S1, S2 normal. No murmurs, rubs, or gallops.  ABDOMEN: Soft, mild tenderness noted, distended. Bowel sounds present. No organomegaly or mass.  EXTREMITIES: No cyanosis, clubbing  Has edema b/l.    NEUROLOGIC: Cranial nerves II through XII are intact. No focal Motor or sensory deficits b/l.   PSYCHIATRIC: The patient is alert and oriented x 3.  SKIN: No obvious rash, lesion, or ulcer.   LABORATORY PANEL:   CBC Recent Labs  Lab 12/15/17 0344  WBC 8.4  HGB 10.4*  HCT 31.7*  PLT 121*   ------------------------------------------------------------------------------------------------------------------ Chemistries  Recent Labs  Lab 12/13/17 2051  12/15/17 0344  NA 132*   < > 131*  K 4.9   < > 4.2  CL 97*   < > 99  CO2 20*   < > 23  GLUCOSE 168*   < > 147*  BUN 28*   < > 26*  CREATININE 1.35*   < > 1.00  CALCIUM 9.9   < > 9.1  AST 190*  --   --   ALT 36  --   --   ALKPHOS 175*  --   --   BILITOT 9.0*  --   --    < > = values in this interval not displayed.   ------------------------------------------------------------------------------------------------------------------  Cardiac Enzymes No results for  input(s): TROPONINI in the last 168 hours. ------------------------------------------------------------------------------------------------------------------  RADIOLOGY:  US Paracentesis  Result Date: 12/14/2017 INDICATION: Hepatocellular carcinoma and refractory ascites. EXAM: ULTRASOUND GUIDED PARACENTESIS MEDICATIONS: None. COMPLICATIONS: None immediate. PROCEDURE: Informed written consent was obtained from the patient after  a discussion of the risks, benefits and alternatives to treatment. A timeout was performed prior to the initiation of the procedure. Initial ultrasound was performed to localize ascites. The right lower abdomen was prepped and draped in the usual sterile fashion. 1% lidocaine was used for local anesthesia. Following this, a 6 Fr Safe-T-Centesis catheter was introduced. An ultrasound image was saved for documentation purposes. The paracentesis was performed. The catheter was removed and a dressing was applied. The patient tolerated the procedure well without immediate post procedural complication. FINDINGS: A total of approximately 5 L of yellowish fluid was removed. IMPRESSION: Successful ultrasound-guided paracentesis yielding 5 liters of peritoneal fluid. Electronically Signed   By: Aletta Edouard M.D.   On: 12/14/2017 14:05     ASSESSMENT AND PLAN:   62 year old male patient with history of COPD, cirrhosis of liver, congestive heart failure, hepatocellular cancer, chronic hepatitis B and C currently under hospitalist service for abdominal distention  -Sepsis secondary to streptococcal bacteremia Appreciate infectious disease input Continue IV antibiotic Cultures and sensitivities to follow-up  -Streptococcus pneumonia bacteremia resolving slowly Continue IV Rocepin antibiotic Follow-up sensitivities  -Decompensated liver disease with ascites Ultrasound-guided paracentesis done  Albumin infusion tolerated well GI follow-up  -Spontaneous bacterial peritonitis ID follow-up and continue antibiotics  -Chronic congestive heart failure Stable Not in overt heart failure  -Long-term prognosis poor  -Palliative care consult appreciated Refused hospice services currently Home with home health services once clinically stable  All the records are reviewed and case discussed with Care Management/Social Worker. Management plans discussed with the patient, family and they are in  agreement.  CODE STATUS: Full code  DVT Prophylaxis: SCDs  TOTAL TIME TAKING CARE OF THIS PATIENT: 32 minutes.   POSSIBLE D/C IN 2 to 3 DAYS, DEPENDING ON CLINICAL CONDITION.  Saundra Shelling M.D on 12/16/2017 at 10:59 AM  Between 7am to 6pm - Pager - [phone]  After 6pm go to [email or web address] - password [email or web address] [facility]  Office  [phone]  CC: Primary care physician; Glendon Axe, MD  Note: This dictation was prepared with Dragon dictation along with smaller phrase technology. Any transcriptional errors that result from this process are unintentional.

## 2017-12-16 NOTE — Progress Notes (Signed)
Follow up visit made. Patient is comfortable appearing without any acute complaints.   I met with patient and his daughter. Medical problems and recommendations reviewed. Daughter says she recognizes that his prognosis is poor and that he is likely approaching end of life. We talked about hospice involvement at home and both patient and daughter agreed with this plan. Daughter plans to update her siblings tonight.   Plan: Home with hospice when medically ready CM consult to coordinate hospice referral

## 2017-12-16 NOTE — Progress Notes (Signed)
New referral for [facility] services at home receive from [place] following Palliative Medicine consult/follow up. Writer spoke with patient's daughter Ollen Gross [phone] to discuss services and DME needs. She would like to speak with her brothers this evening, plan is to meet in the patient's room tomorrow morning. CMRN Levada Dy made aware. Flo Shanks RN, [facility], hospital liaison [phone]

## 2017-12-16 NOTE — Care Management Important Message (Signed)
Important Message  Patient Details  Name: Phillip Warren MRN: [id number] Date of Birth: September 08, 1955   Medicare Important Message Given:  Yes    [name] 12/16/2017, 1:47 PM

## 2017-12-16 NOTE — Progress Notes (Signed)
[facility]  [phone] [phone]   Name: Phillip Warren Date: 12/16/2017 MRN: [id number]  DOB: 1955/08/10  Patient Care Team: Glendon Axe, MD as PCP - General (Internal Medicine) Wellington Hampshire, MD as PCP - Cardiology (Cardiology) Efrain Sella, MD as PCP - Gastroenterology (Gastroenterology) Clent Jacks, RN as Registered Nurse    REASON FOR CONSULTATION: Palliative Care consult requested for this62 y.o.malewith multiple medical problems including hepatocellular carcinoma initially diagnosed in 2016. Patient underwent ablation and then was subsequently lost to follow-up. PMH is also notable for history of cirrhosis, active hepatitis B and C, CAD, ischemic cardiomyopathy with EF of 25 to 30% per ECHO in February 2019, history of polysubstance abuse with alcohol/THC and IV drugs, history of GI bleed, and COPD. 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 Unfortunately, patient has not bee able to make these appointments.He is now admitted 12/13/17 with sepsis from SBP. Palliative care consult requested to help address goals.    CODE STATUS: DNR  PAST MEDICAL HISTORY: Past Medical History:  Diagnosis Date  . Alcohol abuse   . Anorexia    has trouble eating due to poor appetite  . Anxiety    trouble sleeping; lost two sisters and his mother close together  . Arthritis    hands, feet (burning, stinging)  . Cancer [facility])    liver lesion initial staging.  . CHF (congestive heart failure) ([place])   . Cirrhosis of liver ([place])   . Cocaine abuse ([place])   . COPD (chronic obstructive pulmonary disease) ([place])   . Coronary artery disease    Inferior ST elevation myocardial infarction in  February 2019.  Cardiac catheterization showed subtotal occlusion of mid right coronary artery with severe disease affecting the LAD and left circumflex.  Successful PCI and 2 drug-eluting stent placement to the RCA.  He returned a few days after hospital discharge with stent thrombosis due to not taking any of his cardiac medications.   Marland Kitchen GERD (gastroesophageal reflux disease)   . H/O dizziness   . Headache   . Hepatitis    B and C (chronic)  . Marijuana use   . Myocardial infarction ([place])    massive MI at age 55  . Neuropathy   . Numbness and tingling    legs and feet bilat   . Pneumonia   . Repeated falls   . Shortness of breath dyspnea   . Tinnitus   . Tobacco abuse     PAST SURGICAL HISTORY:  Past Surgical History:  Procedure Laterality Date  . CARDIAC SURGERY    . COLONOSCOPY WITH PROPOFOL N/A 08/31/2014   Procedure: COLONOSCOPY WITH PROPOFOL;  Surgeon: Josefine Class, MD;  Location[facility] ENDOSCOPY;  Service: Endoscopy;  Laterality: N/A;  . CORONARY ANGIOPLASTY     at age 26 secondary to massive MI  . CORONARY/GRAFT ACUTE MI REVASCULARIZATION N/A 03/09/2017   Procedure: Coronary/Graft Acute MI Revascularization;  Surgeon: Wellington Hampshire, MD;  Location: [facility] CV LAB;  Service: Cardiovascular;  Laterality: N/A;  . CORONARY/GRAFT ACUTE MI REVASCULARIZATION N/A 03/16/2017   Procedure: Coronary/Graft Acute MI Revascularization;  Surgeon: Wellington Hampshire, MD;  Location: [place] CV LAB;  Service: Cardiovascular;  Laterality: N/A;  balloon only RCA  .  ESOPHAGOGASTRODUODENOSCOPY (EGD) WITH PROPOFOL N/A 08/31/2014   Procedure: ESOPHAGOGASTRODUODENOSCOPY (EGD) WITH PROPOFOL;  Surgeon: Josefine Class, MD;  Location: [facility] ENDOSCOPY;  Service: Endoscopy;  Laterality: N/A;  . ESOPHAGOGASTRODUODENOSCOPY (EGD) WITH PROPOFOL N/A 01/02/2017   Procedure: ESOPHAGOGASTRODUODENOSCOPY (EGD) WITH PROPOFOL;  Surgeon: Toledo, Benay Pike, MD;  Location: [facility] ENDOSCOPY;  Service:  Gastroenterology;  Laterality: N/A;  . LEFT HEART CATH AND CORONARY ANGIOGRAPHY N/A 03/09/2017   Procedure: LEFT HEART CATH AND CORONARY ANGIOGRAPHY;  Surgeon: Wellington Hampshire, MD;  Location: [place] CV LAB;  Service: Cardiovascular;  Laterality: N/A;  . LEFT HEART CATH AND CORONARY ANGIOGRAPHY N/A 03/16/2017   Procedure: LEFT HEART CATH AND CORONARY ANGIOGRAPHY;  Surgeon: Wellington Hampshire, MD;  Location: [place] CV LAB;  Service: Cardiovascular;  Laterality: N/A;    HEMATOLOGY/ONCOLOGY HISTORY:   No history exists.    ALLERGIES:  is allergic to multihance [gadobenate]; hydrocodone; other; and vicodin [hydrocodone-acetaminophen].  MEDICATIONS:  Current Facility-Administered Medications  Medication Dose Route Frequency Provider Last Rate Last Dose  . albumin human 25 % solution 25 g  25 g Intravenous Daily Jonathon Bellows, MD 25 mL/hr at 12/15/17 1650 25 g at 12/15/17 1650  . cefTRIAXone (ROCEPHIN) 2 g in sodium chloride 0.9 % 100 mL IVPB  2 g Intravenous Q24H Saundra Shelling, MD   Stopped at 12/15/17 1244  . diphenhydrAMINE (BENADRYL) capsule 25 mg  25 mg Oral Q8H PRN Saundra Shelling, MD   25 mg at 12/15/17 1301  . gabapentin (NEURONTIN) capsule 800 mg  800 mg Oral QID Lance Coon, MD   800 mg at 12/16/17 0915  . midodrine (PROAMATINE) tablet 5 mg  5 mg Oral TID WC Jonathon Bellows, MD   5 mg at 12/16/17 7616  . multivitamin with minerals tablet 1 tablet  1 tablet Oral Daily Saundra Shelling, MD   1 tablet at 12/16/17 0916  . octreotide (SANDOSTATIN) injection 100 mcg  100 mcg Subcutaneous TID Jonathon Bellows, MD   100 mcg at 12/16/17 0929  . ondansetron (ZOFRAN) tablet 4 mg  4 mg Oral Q6H PRN Lance Coon, MD   4 mg at 12/16/17 0343   Or  . ondansetron [facility]) injection 4 mg  4 mg Intravenous Q6H PRN Lance Coon, MD   4 mg at 12/15/17 1454  . oxyCODONE (Oxy IR/ROXICODONE) immediate release tablet 10 mg  10 mg Oral Q6H PRN Lance Coon, MD   10 mg at 12/16/17 0353  . phytonadione  (VITAMIN K) SQ injection 10 mg  10 mg Subcutaneous Daily Jonathon Bellows, MD   10 mg at 12/16/17 0931    VITAL SIGNS: BP 124/82 (BP Location: Left Arm)   Pulse (!) 111   Temp 98.5 F (36.9 C) (Oral)   Resp 18   Ht 6' (1.829 m)   Wt 157 lb 3 oz (71.3 kg) Comment: bed scale  SpO2 93%   BMI 21.32 kg/m  Filed Weights   12/13/17 2040 12/15/17 1450  Weight: 149 lb 9.6 oz (67.9 kg) 157 lb 3 oz (71.3 kg)    Estimated body mass index is 21.32 kg/m as calculated from the following:   Height as of this encounter: 6' (1.829 m).   Weight as of this encounter: 157 lb 3 oz (71.3 kg).  LABS: CBC:    Component Value Date/Time   WBC 8.4 12/15/2017 0344   HGB 10.4 (L) 12/15/2017 0344   HGB 16.4 05/14/2014 1606   HCT 31.7 (L) 12/15/2017 0344   HCT 49.7 05/14/2014 1606  PLT 121 (L) 12/15/2017 0344   PLT 111 (L) 05/14/2014 1606   MCV 83.4 12/15/2017 0344   MCV 96 05/14/2014 1606   NEUTROABS 15.0 (H) 12/13/2017 2051   NEUTROABS 3.4 05/14/2014 1606   LYMPHSABS 1.3 12/13/2017 2051   LYMPHSABS 1.8 05/14/2014 1606   MONOABS 0.8 12/13/2017 2051   MONOABS 0.5 05/14/2014 1606   EOSABS 0.0 12/13/2017 2051   EOSABS 0.2 05/14/2014 1606   BASOSABS 0.1 12/13/2017 2051   BASOSABS 0.0 05/14/2014 1606   Comprehensive Metabolic Panel:    Component Value Date/Time   NA 131 (L) 12/15/2017 0344   NA 135 05/14/2014 1606   K 4.2 12/15/2017 0344   K 4.2 05/14/2014 1606   CL 99 12/15/2017 0344   CL 99 (L) 05/14/2014 1606   CO2 23 12/15/2017 0344   CO2 26 05/14/2014 1606   BUN 26 (H) 12/15/2017 0344   BUN 17 05/14/2014 1606   CREATININE 1.00 12/15/2017 0344   CREATININE 1.14 05/14/2014 1606   GLUCOSE 147 (H) 12/15/2017 0344   GLUCOSE 109 (H) 05/14/2014 1606   CALCIUM 9.1 12/15/2017 0344   CALCIUM 9.0 05/14/2014 1606   AST 190 (H) 12/13/2017 2051   AST 162 (H) 05/14/2014 1606   ALT 36 12/13/2017 2051   ALT 184 (H) 05/14/2014 1606   ALKPHOS 175 (H) 12/13/2017 2051   ALKPHOS 101 05/14/2014 1606    BILITOT 9.0 (H) 12/13/2017 2051   BILITOT 0.5 07/24/2017 1203   BILITOT 0.7 05/14/2014 1606   PROT 8.1 12/13/2017 2051   PROT 7.7 07/24/2017 1203   PROT 8.8 (H) 05/14/2014 1606   ALBUMIN 2.6 (L) 12/13/2017 2051   ALBUMIN 3.8 07/24/2017 1203   ALBUMIN 3.9 05/14/2014 1606    RADIOGRAPHIC STUDIES: Dg Chest 2 View  Result Date: 12/13/2017 CLINICAL DATA:  Leg swelling for 3-4 days.  Abdominal pain. EXAM: CHEST - 2 VIEW COMPARISON:  10/22/2017 FINDINGS: AP and lateral views of the chest show low volumes. There is basilar atelectasis bilaterally, left greater than right. The cardiopericardial silhouette is within normal limits for size. Bones are diffusely demineralized. Telemetry leads overlie the chest. IMPRESSION: Basilar atelectasis, left greater than right. Electronically Signed   By: Misty Stanley M.D.   On: 12/13/2017 21:36   Ct Chest Wo Contrast  Result Date: 11/23/2017 CLINICAL DATA:  Recurrent hepatocellular carcinoma. Staging. Chest pain and shortness of breath. Recent cardiac stent. Smoker. EXAM: CT CHEST WITHOUT CONTRAST TECHNIQUE: Multidetector CT imaging of the chest was performed following the standard protocol without IV contrast. COMPARISON:  Abdominal MRI 10/22/2017. Chest radiograph 10/22/2017. Most recent chest CT 08/30/2014. FINDINGS: Cardiovascular: Aortic and branch vessel atherosclerosis. Normal heart size, without pericardial effusion. Multivessel coronary artery atherosclerosis. Mediastinum/Nodes: No supraclavicular adenopathy. No mediastinal or definite hilar adenopathy, given limitations of unenhanced CT. Lungs/Pleura: No pleural fluid. Lower lobe predominant bronchial wall thickening. Minimal motion degradation in the lower chest. Mild centrilobular and paraseptal emphysema. Bibasilar scarring or subsegmental atelectasis. Interstitial thickening in the posterior left upper lobe with adjacent left major fissure thickening, similar including on image 53/3 indicative of  scarring. Minimal biapical pleuroparenchymal scarring. Suspect a 4 mm right middle lobe pulmonary nodule on image 87/3. Not readily apparent on the prior exam, possibly due to thinner slice collimation today. Upper Abdomen: Cirrhosis. Abdominal ascites. Suboptimal visualization of previously described dominant hepatic mass, secondary to noncontrast technique. Grossly normal imaged portions of the adrenal glands, left kidney, spleen, stomach. Musculoskeletal: No acute osseous abnormality. Mild right hemidiaphragm elevation. IMPRESSION: 1.  No acute process or evidence of metastatic disease in the chest. 2. Right middle lobe isolated 4 mm nodule, not readily apparent on the prior. Recommend attention on follow-up. 3. Aortic atherosclerosis (ICD10-I70.0), coronary artery atherosclerosis and emphysema (ICD10-J43.9). Electronically Signed   By: Abigail Miyamoto M.D.   On: 11/23/2017 16:06   US Paracentesis  Result Date: 12/14/2017 INDICATION: Hepatocellular carcinoma and refractory ascites. EXAM: ULTRASOUND GUIDED PARACENTESIS MEDICATIONS: None. COMPLICATIONS: None immediate. PROCEDURE: Informed written consent was obtained from the patient after a discussion of the risks, benefits and alternatives to treatment. A timeout was performed prior to the initiation of the procedure. Initial ultrasound was performed to localize ascites. The right lower abdomen was prepped and draped in the usual sterile fashion. 1% lidocaine was used for local anesthesia. Following this, a 6 Fr Safe-T-Centesis catheter was introduced. An ultrasound image was saved for documentation purposes. The paracentesis was performed. The catheter was removed and a dressing was applied. The patient tolerated the procedure well without immediate post procedural complication. FINDINGS: A total of approximately 5 L of yellowish fluid was removed. IMPRESSION: Successful ultrasound-guided paracentesis yielding 5 liters of peritoneal fluid. Electronically  Signed   By: Aletta Edouard M.D.   On: 12/14/2017 14:05   US Paracentesis  Result Date: 11/24/2017 INDICATION: Ascites EXAM: ULTRASOUND GUIDED  PARACENTESIS MEDICATIONS: None. COMPLICATIONS: None immediate. PROCEDURE: Informed written consent was obtained from the patient after a discussion of the risks, benefits and alternatives to treatment. A timeout was performed prior to the initiation of the procedure. Initial ultrasound scanning demonstrates a large amount of ascites within the right lower abdominal quadrant. The right lower abdomen was prepped and draped in the usual sterile fashion. 1% lidocaine with epinephrine was used for local anesthesia. Following this, a 6 Fr Safe-T-Centesis catheter was introduced. An ultrasound image was saved for documentation purposes. The paracentesis was performed. The catheter was removed and a dressing was applied. The patient tolerated the procedure well without immediate post procedural complication. FINDINGS: A total of approximately 4.4 L of clear yellow fluid was removed. IMPRESSION: Successful ultrasound-guided paracentesis yielding 4.4 liters of peritoneal fluid. Electronically Signed   By: Marybelle Killings M.D.   On: 11/24/2017 14:28   US Paracentesis  Result Date: 11/16/2017 INDICATION: History infiltrative hepatocellular carcinoma and malignant portal vein thrombosis, now with symptomatic intra-abdominal ascites. Please perform ultrasound-guided paracentesis for both therapeutic and diagnostic purposes. EXAM: ULTRASOUND-GUIDED PARACENTESIS COMPARISON:  MRCP-10/22/2017 MEDICATIONS: None. COMPLICATIONS: None immediate. TECHNIQUE: Informed written consent was obtained from the patient after a discussion of the risks, benefits and alternatives to treatment. A timeout was performed prior to the initiation of the procedure. Initial ultrasound scanning demonstrates a moderate to large amount of ascites within the right lower abdominal quadrant. The right lower  abdomen was prepped and draped in the usual sterile fashion. 1% lidocaine with epinephrine was used for local anesthesia. An ultrasound image was saved for documentation purposed. An 8 Fr Safe-T-Centesis catheter was introduced. The paracentesis was performed. The catheter was removed and a dressing was applied. The patient tolerated the procedure well without immediate post procedural complication. FINDINGS: A total of approximately 6.9 liters of serous fluid was removed. Samples were sent to the laboratory as requested by the clinical team. IMPRESSION: Successful ultrasound-guided paracentesis yielding 6.9 liters of peritoneal fluid. Electronically Signed   By: Sandi Mariscal M.D.   On: 11/16/2017 11:55    PERFORMANCE STATUS (ECOG) : 3 - Symptomatic, >50% confined to bed  Review of Systems As noted  above. Otherwise, a complete review of systems is negative.  Physical Exam General: frail appearing, bilat temporal wasting Cardiovascular: regular rate and rhythm Pulmonary: clear ant fields Abdomen: distended, firm, + fluid wave Extremities: + edema in bilat LE Skin: no rashes Neurological: Weakness but otherwise nonfocal  IMPRESSION: Patient comfortable appearing. He has some abd pain from increased distension. Suspect that patient will need repeat paracentesis for his comfort. He is taking prn oxycodone, which helps.   I have asked the RN to notify me when daughter arrives. Patient would like me to speak with her to clarify plan.   PLAN: Continue medical treatment/supportive care Consider repeat paracentesis for comfort Will meet with daughter when she arrives   Time Total: 30 minutes  Visit consisted of counseling and education dealing with the complex and emotionally intense issues of symptom management and palliative care in the setting of serious and potentially life-threatening illness.Greater than 50%  of this time was spent counseling and coordinating care related to the above  assessment and plan.  Signed by: Altha Harm, PhD, DNP, NP-C, [facility] [phone] (Work Cell)

## 2017-12-17 ENCOUNTER — Inpatient Hospital Stay: Payer: Medicare Other

## 2017-12-17 DIAGNOSIS — R188 Other ascites: Secondary | ICD-10-CM

## 2017-12-17 LAB — BODY FLUID CULTURE: CULTURE: NO GROWTH

## 2017-12-17 LAB — AMMONIA: Ammonia: 25 umol/L (ref 9–35)

## 2017-12-17 LAB — ECHOCARDIOGRAM COMPLETE
Height: 72 in
Weight: 2515.01 [oz_av]

## 2017-12-17 LAB — LIPASE, FLUID: Lipase-Fluid: <3 U/L

## 2017-12-17 MED ORDER — OXYCODONE HCL 5 MG PO TABS
10.0000 mg | ORAL_TABLET | ORAL | Status: DC | PRN
  Administered 2017-12-17 (×2): 10 mg via ORAL
  Filled 2017-12-17 (×2): qty 2

## 2017-12-17 NOTE — Progress Notes (Signed)
Left AMA.

## 2017-12-17 NOTE — Progress Notes (Signed)
[place] [facility] [place] NAME: Phillip Warren    MR#:  [id number]  DATE OF BIRTH:  [birth year]  SUBJECTIVE:   Patient still complaining of some abdominal pain and distention, also complained that he has some lower extremity pain with worsening edema.  Status post repeat ultrasound-guided paracentesis today with 6.1 L of fluid removed.  REVIEW OF SYSTEMS:    Review of Systems  Constitutional: Negative for chills and fever.  HENT: Negative for congestion and tinnitus.   Eyes: Negative for blurred vision and double vision.  Respiratory: Negative for cough, shortness of breath and wheezing.   Cardiovascular: Negative for chest pain, orthopnea and PND.  Gastrointestinal: Positive for abdominal pain. Negative for diarrhea, nausea and vomiting.  Genitourinary: Negative for dysuria and hematuria.  Neurological: Negative for dizziness, sensory change and focal weakness.  All other systems reviewed and are negative.   Nutrition: Heart Healthy Tolerating Diet: yes Tolerating PT: Await Eval.   DRUG ALLERGIES:   Allergies  Allergen Reactions  . Multihance [Gadobenate] Hives and Shortness Of Breath  . Hydrocodone Itching and Nausea And Vomiting  . Other Itching    multihance MRI dye  . Vicodin [Hydrocodone-Acetaminophen] Nausea Only and Nausea And Vomiting    Vomiting     VITALS:  Blood pressure 103/71, pulse 97, temperature 98.5 F (36.9 C), temperature source Oral, resp. rate 15, height 6' (1.829 m), weight 71.3 kg, SpO2 93 %.  PHYSICAL EXAMINATION:   Physical Exam  GENERAL:  62 y.o.-year-old patient lying in bed in mild Distress due to abdominal distension.   EYES: Pupils equal, round, reactive to light and accommodation. + scleral icterus. Extraocular muscles intact.  HEENT: Head atraumatic, normocephalic. Oropharynx and nasopharynx clear.  NECK:  Supple, no jugular venous distention. No thyroid enlargement, no tenderness.  LUNGS:  Normal breath sounds bilaterally, no wheezing, rales, rhonchi. No use of accessory muscles of respiration.  CARDIOVASCULAR: S1, S2 normal. No murmurs, rubs, or gallops.  ABDOMEN: Soft, nontender, distended/protuberant. Bowel sounds present. No organomegaly or mass.  EXTREMITIES: No cyanosis, clubbing, + 2 edema b/l.  NEUROLOGIC: Cranial nerves II through XII are intact. No focal Motor or sensory deficits b/l.  Globally weak.  PSYCHIATRIC: The patient is alert and oriented x 3.  SKIN: No obvious rash, lesion, or ulcer.    LABORATORY PANEL:   CBC Recent Labs  Lab 12/15/17 0344  WBC 8.4  HGB 10.4*  HCT 31.7*  PLT 121*   ------------------------------------------------------------------------------------------------------------------  Chemistries  Recent Labs  Lab 12/13/17 2051  12/15/17 0344  NA 132*   < > 131*  K 4.9   < > 4.2  CL 97*   < > 99  CO2 20*   < > 23  GLUCOSE 168*   < > 147*  BUN 28*   < > 26*  CREATININE 1.35*   < > 1.00  CALCIUM 9.9   < > 9.1  AST 190*  --   --   ALT 36  --   --   ALKPHOS 175*  --   --   BILITOT 9.0*  --   --    < > = values in this interval not displayed.   ------------------------------------------------------------------------------------------------------------------  Cardiac Enzymes No results for input(s): TROPONINI in the last 168 hours. ------------------------------------------------------------------------------------------------------------------  RADIOLOGY:  US Paracentesis  Result Date: 12/17/2017 INDICATION: History of cirrhosis and past sided carcinoma now with recurrent symptomatic ascites. Please perform ultrasound-guided paracentesis for therapeutic purposes. EXAM:  ULTRASOUND-GUIDED PARACENTESIS COMPARISON:  Multiple previous ultrasound-guided paracenteses, most recently on 12/14/2017 yielding 5 L of peritoneal fluid. MEDICATIONS: None. COMPLICATIONS: None immediate. TECHNIQUE: Informed written consent was obtained from  the patient after a discussion of the risks, benefits and alternatives to treatment. A timeout was performed prior to the initiation of the procedure. Initial ultrasound scanning demonstrates a large amount of ascites within the right lower abdominal quadrant. The right lower abdomen was prepped and draped in the usual sterile fashion. 1% lidocaine with epinephrine was used for local anesthesia. An ultrasound image was saved for documentation purposed. An 8 Fr Safe-T-Centesis catheter was introduced. The paracentesis was performed. The catheter was removed and a dressing was applied. The patient tolerated the procedure well without immediate post procedural complication. FINDINGS: A total of approximately 6.1 liters of serous fluid was removed. IMPRESSION: Successful ultrasound-guided paracentesis yielding 6.1 liters of peritoneal fluid. Electronically Signed   By: Sandi Mariscal M.D.   On: 12/17/2017 11:44     ASSESSMENT AND PLAN:   62 year old male with past medical history of hepatoma, liver cirrhosis, history of coronary artery disease, anxiety, CHF, substance abuse, GERD who presented to the hospital due to abdominal pain and worsening lower extremity swelling.  1.  Strep pneumo bacteremia- source remains unclear.  Patient's chest x-ray is negative for pneumonia. -Seen by infectious disease and patient will need 2 weeks of antibiotics. - cont. Ceftriaxone. Await Echo results.    2. SBP - s/p US guided paracentesis  - WBC count in the fluid was elevated and pt. Likely has SBP and cont. Ceftriaxone and can switch to Oral antibiotics upon discharge.  3.  Hepatoma with liver cirrhosis- continue octreotide, albumin. - Status post ultrasound-guided therapeutic paracentesis with 6.1 L of fluid removed.  4.  Neuropathy-continue gabapentin.  5.  Chronic hypotension-continue midodrine. -This is secondary to patient's chronic liver disease.  Prognosis is quite poor given his liver cirrhosis, hepatoma.   Palliative care consult appreciated.  Patient is a DNR.  Plan for discharge home with hospice when stable.     All the records are reviewed and case discussed with Care Management/Social Worker. Management plans discussed with the patient, family and they are in agreement.  CODE STATUS: DNR  DVT Prophylaxis: Ted's & SCD's.   TOTAL TIME TAKING CARE OF THIS PATIENT: 30 minutes.   POSSIBLE D/C IN 2-3 DAYS, DEPENDING ON CLINICAL CONDITION.   Henreitta Leber M.D on 12/17/2017 at 1:17 PM  Between 7am to 6pm - Pager - [phone]  After 6pm go to [email or web address] - Proofreader  [facility]  Office  [phone]  CC: Primary care physician; Glendon Axe, MD

## 2017-12-17 NOTE — Procedures (Signed)
Pre Procedural Dx: Symptomatic Ascites Post Procedural Dx: Same  Successful US guided paracentesis yielding 6.1 L of serous ascitic fluid.  EBL: None  Complications: None immediate  Ronny Bacon, MD Pager #: [phone]

## 2017-12-17 NOTE — Progress Notes (Signed)
I met with patient and his daughter. I then spoke with daughter in private. Both verbalize a desire to take him home today AMA. Patient says that his brother and son are in town and he wants to see them.   I have asked patient's daughter to bring him tomorrow to the clinic to see me. Will try to clarify goals/plan.  Time: 45 minutes

## 2017-12-17 NOTE — Progress Notes (Signed)
Was informed that patient refused hospice services and is planning to leave the hospital AMA. I spoke with patient. He says his brother is coming into town and that he wants to meet with him to sort through his belonging. He verbalized feeling pressured and says he wants to go home and think about things for a few days. His daughter is apparently on the way to the hospital to get him.   Note, that patient has some word searching and does not appear fully oriented to me. For example, I was talking to him about follow up in the [facility] and he thought he was in the [place].   I will check a STAT ammonia level.   Discussed with attending.   Plan: Serum ammonia STAT Will speak with daughter when she arrives

## 2017-12-17 NOTE — Progress Notes (Signed)
Visit made to new referral for [facility] services at home. Patient seen sitting up in bed, alert, recently back from a paracentesis with 6 liters drawn off. Friend Amy at bedside. Patient's daughter did not come to the hospital this morning as pl;anned to meet me. Patient says he still wants to think about it and wants to go home today. I called his daughter and she said she could not make him accept hospice, she spoke with her brothers last night, they also said they could not force hm to choose hospice. She feels that when her uncle visits next week he has more influence and may be able to get him to accept services. CMRN Marshell Garfinkel and staff RN Andi Hence updated. Message sent to Palliative NP Josh Borders. Referral on hold at this time. Flo Shanks RN,BSN, [facility], hospital Liaison [phone]

## 2017-12-17 NOTE — Care Management (Addendum)
RNCM received notification again that patient is declining home hospice services. His daughter is aware.  Patient is not currently agreeable to home health services or palliative services at discharge.

## 2017-12-17 NOTE — Progress Notes (Signed)
Pt's daughter signed AMA form after speaking with Josh PA, states pt will be staying with her tonight. PIV removed. VSS, Pt left with daughter.

## 2017-12-17 NOTE — Progress Notes (Signed)
[place]  [phone] [phone]   Name: Phillip Warren Date: 12/17/2017 MRN: [id number]  DOB: Jun 23, 1955  Patient Care Team: Glendon Axe, MD as PCP - General (Internal Medicine) Wellington Hampshire, MD as PCP - Cardiology (Cardiology) Efrain Sella, MD as PCP - Gastroenterology (Gastroenterology) Clent Jacks, RN as Registered Nurse    REASON FOR CONSULTATION: Palliative Care consult requested for this62 y.o.malewith multiple medical problems including hepatocellular carcinoma initially diagnosed in 2016. Patient underwent ablation and then was subsequently lost to follow-up. PMH is also notable for history of cirrhosis, active hepatitis B and C, CAD, ischemic cardiomyopathy with EF of 25 to 30% per ECHO in February 2019, history of polysubstance abuse with alcohol/THC and IV drugs, history of GI bleed, and COPD. 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 Unfortunately, patient has not bee able to make these appointments.He is now admitted 12/13/17 with sepsis from SBP. Palliative care consult requested to help address goals.    CODE STATUS: DNR  PAST MEDICAL HISTORY: Past Medical History:  Diagnosis Date  . Alcohol abuse   . Anorexia    has trouble eating due to poor appetite  . Anxiety    trouble sleeping; lost two sisters and his mother close together  . Arthritis    hands, feet (burning, stinging)  . Cancer [facility])    liver lesion initial staging.  . CHF (congestive heart failure) ([place])   . Cirrhosis of liver ([place])   . Cocaine abuse ([place])   . COPD (chronic obstructive pulmonary disease) ([place])   . Coronary artery disease    Inferior ST elevation myocardial infarction in  February 2019.  Cardiac catheterization showed subtotal occlusion of mid right coronary artery with severe disease affecting the LAD and left circumflex.  Successful PCI and 2 drug-eluting stent placement to the RCA.  He returned a few days after hospital discharge with stent thrombosis due to not taking any of his cardiac medications.   Marland Kitchen GERD (gastroesophageal reflux disease)   . H/O dizziness   . Headache   . Hepatitis    B and C (chronic)  . Marijuana use   . Myocardial infarction ([place])    massive MI at age 79  . Neuropathy   . Numbness and tingling    legs and feet bilat   . Pneumonia   . Repeated falls   . Shortness of breath dyspnea   . Tinnitus   . Tobacco abuse     PAST SURGICAL HISTORY:  Past Surgical History:  Procedure Laterality Date  . CARDIAC SURGERY    . COLONOSCOPY WITH PROPOFOL N/A 08/31/2014   Procedure: COLONOSCOPY WITH PROPOFOL;  Surgeon: Josefine Class, MD;  Location: [facility] ENDOSCOPY;  Service: Endoscopy;  Laterality: N/A;  . CORONARY ANGIOPLASTY     at age 78 secondary to massive MI  . CORONARY/GRAFT ACUTE MI REVASCULARIZATION N/A 03/09/2017   Procedure: Coronary/Graft Acute MI Revascularization;  Surgeon: Wellington Hampshire, MD;  Location: [place] CV LAB;  Service: Cardiovascular;  Laterality: N/A;  . CORONARY/GRAFT ACUTE MI REVASCULARIZATION N/A 03/16/2017   Procedure: Coronary/Graft Acute MI Revascularization;  Surgeon: Wellington Hampshire, MD;  Location: [place] CV LAB;  Service: Cardiovascular;  Laterality: N/A;  balloon only RCA  .  ESOPHAGOGASTRODUODENOSCOPY (EGD) WITH PROPOFOL N/A 08/31/2014   Procedure: ESOPHAGOGASTRODUODENOSCOPY (EGD) WITH PROPOFOL;  Surgeon: Josefine Class, MD;  Location: [facility] ENDOSCOPY;  Service: Endoscopy;  Laterality: N/A;  . ESOPHAGOGASTRODUODENOSCOPY (EGD) WITH PROPOFOL N/A 01/02/2017   Procedure: ESOPHAGOGASTRODUODENOSCOPY (EGD) WITH PROPOFOL;  Surgeon: [place], Benay Pike, MD;  Location: [facility] ENDOSCOPY;  Service:  Gastroenterology;  Laterality: N/A;  . LEFT HEART CATH AND CORONARY ANGIOGRAPHY N/A 03/09/2017   Procedure: LEFT HEART CATH AND CORONARY ANGIOGRAPHY;  Surgeon: Wellington Hampshire, MD;  Location: [place] CV LAB;  Service: Cardiovascular;  Laterality: N/A;  . LEFT HEART CATH AND CORONARY ANGIOGRAPHY N/A 03/16/2017   Procedure: LEFT HEART CATH AND CORONARY ANGIOGRAPHY;  Surgeon: Wellington Hampshire, MD;  Location: [place] CV LAB;  Service: Cardiovascular;  Laterality: N/A;    HEMATOLOGY/ONCOLOGY HISTORY:   No history exists.    ALLERGIES:  is allergic to multihance [gadobenate]; hydrocodone; other; and vicodin [hydrocodone-acetaminophen].  MEDICATIONS:  Current Facility-Administered Medications  Medication Dose Route Frequency Provider Last Rate Last Dose  . albumin human 25 % solution 25 g  25 g Intravenous Daily Jonathon Bellows, MD   Stopped at 12/16/17 1100  . cefTRIAXone (ROCEPHIN) 2 g in sodium chloride 0.9 % 100 mL IVPB  2 g Intravenous Q24H Pyreddy, Reatha Harps, MD 200 mL/hr at 12/16/17 1215 2 g at 12/16/17 1215  . diphenhydrAMINE (BENADRYL) capsule 25 mg  25 mg Oral Q8H PRN Saundra Shelling, MD   25 mg at 12/15/17 1301  . gabapentin (NEURONTIN) capsule 800 mg  800 mg Oral QID Lance Coon, MD   800 mg at 12/16/17 2102  . midodrine (PROAMATINE) tablet 5 mg  5 mg Oral TID WC Jonathon Bellows, MD   5 mg at 12/17/17 0946  . multivitamin with minerals tablet 1 tablet  1 tablet Oral Daily Saundra Shelling, MD   1 tablet at 12/16/17 0916  . octreotide (SANDOSTATIN) injection 100 mcg  100 mcg Subcutaneous TID Jonathon Bellows, MD   100 mcg at 12/16/17 2102  . ondansetron (ZOFRAN) tablet 4 mg  4 mg Oral Q6H PRN Lance Coon, MD   4 mg at 12/16/17 0343   Or  . ondansetron [facility]) injection 4 mg  4 mg Intravenous Q6H PRN Lance Coon, MD   4 mg at 12/16/17 1653  . oxyCODONE (Oxy IR/ROXICODONE) immediate release tablet 10 mg  10 mg Oral Q4H PRN Juletta Berhe, Kirt Boys, NP   10 mg at 12/17/17 0946  . phytonadione  (VITAMIN K) SQ injection 10 mg  10 mg Subcutaneous Daily Jonathon Bellows, MD   10 mg at 12/16/17 0931  . sodium chloride (OCEAN) 0.65 % nasal spray 1 spray  1 spray Each Nare PRN Raquel James, RN        VITAL SIGNS: BP 98/73 (BP Location: Right Arm)   Pulse 88   Temp 98.5 F (36.9 C) (Oral)   Resp 15   Ht 6' (1.829 m)   Wt 157 lb 3 oz (71.3 kg) Comment: bed scale  SpO2 93%   BMI 21.32 kg/m  Filed Weights   12/13/17 2040 12/15/17 1450  Weight: 149 lb 9.6 oz (67.9 kg) 157 lb 3 oz (71.3 kg)    Estimated body mass index is 21.32 kg/m as calculated from the following:   Height as of this encounter: 6' (1.829 m).   Weight as of this encounter: 157 lb 3 oz (71.3 kg).  LABS: CBC:    Component Value Date/Time   WBC 8.4 12/15/2017 0344  HGB 10.4 (L) 12/15/2017 0344   HGB 16.4 05/14/2014 1606   HCT 31.7 (L) 12/15/2017 0344   HCT 49.7 05/14/2014 1606   PLT 121 (L) 12/15/2017 0344   PLT 111 (L) 05/14/2014 1606   MCV 83.4 12/15/2017 0344   MCV 96 05/14/2014 1606   NEUTROABS 15.0 (H) 12/13/2017 2051   NEUTROABS 3.4 05/14/2014 1606   LYMPHSABS 1.3 12/13/2017 2051   LYMPHSABS 1.8 05/14/2014 1606   MONOABS 0.8 12/13/2017 2051   MONOABS 0.5 05/14/2014 1606   EOSABS 0.0 12/13/2017 2051   EOSABS 0.2 05/14/2014 1606   BASOSABS 0.1 12/13/2017 2051   BASOSABS 0.0 05/14/2014 1606   Comprehensive Metabolic Panel:    Component Value Date/Time   NA 131 (L) 12/15/2017 0344   NA 135 05/14/2014 1606   K 4.2 12/15/2017 0344   K 4.2 05/14/2014 1606   CL 99 12/15/2017 0344   CL 99 (L) 05/14/2014 1606   CO2 23 12/15/2017 0344   CO2 26 05/14/2014 1606   BUN 26 (H) 12/15/2017 0344   BUN 17 05/14/2014 1606   CREATININE 1.00 12/15/2017 0344   CREATININE 1.14 05/14/2014 1606   GLUCOSE 147 (H) 12/15/2017 0344   GLUCOSE 109 (H) 05/14/2014 1606   CALCIUM 9.1 12/15/2017 0344   CALCIUM 9.0 05/14/2014 1606   AST 190 (H) 12/13/2017 2051   AST 162 (H) 05/14/2014 1606   ALT 36 12/13/2017 2051    ALT 184 (H) 05/14/2014 1606   ALKPHOS 175 (H) 12/13/2017 2051   ALKPHOS 101 05/14/2014 1606   BILITOT 9.0 (H) 12/13/2017 2051   BILITOT 0.5 07/24/2017 1203   BILITOT 0.7 05/14/2014 1606   PROT 8.1 12/13/2017 2051   PROT 7.7 07/24/2017 1203   PROT 8.8 (H) 05/14/2014 1606   ALBUMIN 2.6 (L) 12/13/2017 2051   ALBUMIN 3.8 07/24/2017 1203   ALBUMIN 3.9 05/14/2014 1606    RADIOGRAPHIC STUDIES: Dg Chest 2 View  Result Date: 12/13/2017 CLINICAL DATA:  Leg swelling for 3-4 days.  Abdominal pain. EXAM: CHEST - 2 VIEW COMPARISON:  10/22/2017 FINDINGS: AP and lateral views of the chest show low volumes. There is basilar atelectasis bilaterally, left greater than right. The cardiopericardial silhouette is within normal limits for size. Bones are diffusely demineralized. Telemetry leads overlie the chest. IMPRESSION: Basilar atelectasis, left greater than right. Electronically Signed   By: Misty Stanley M.D.   On: 12/13/2017 21:36   Ct Chest Wo Contrast  Result Date: 11/23/2017 CLINICAL DATA:  Recurrent hepatocellular carcinoma. Staging. Chest pain and shortness of breath. Recent cardiac stent. Smoker. EXAM: CT CHEST WITHOUT CONTRAST TECHNIQUE: Multidetector CT imaging of the chest was performed following the standard protocol without IV contrast. COMPARISON:  Abdominal MRI 10/22/2017. Chest radiograph 10/22/2017. Most recent chest CT 08/30/2014. FINDINGS: Cardiovascular: Aortic and branch vessel atherosclerosis. Normal heart size, without pericardial effusion. Multivessel coronary artery atherosclerosis. Mediastinum/Nodes: No supraclavicular adenopathy. No mediastinal or definite hilar adenopathy, given limitations of unenhanced CT. Lungs/Pleura: No pleural fluid. Lower lobe predominant bronchial wall thickening. Minimal motion degradation in the lower chest. Mild centrilobular and paraseptal emphysema. Bibasilar scarring or subsegmental atelectasis. Interstitial thickening in the posterior left upper lobe  with adjacent left major fissure thickening, similar including on image 53/3 indicative of scarring. Minimal biapical pleuroparenchymal scarring. Suspect a 4 mm right middle lobe pulmonary nodule on image 87/3. Not readily apparent on the prior exam, possibly due to thinner slice collimation today. Upper Abdomen: Cirrhosis. Abdominal ascites. Suboptimal visualization of previously described dominant hepatic mass, secondary to  noncontrast technique. Grossly normal imaged portions of the adrenal glands, left kidney, spleen, stomach. Musculoskeletal: No acute osseous abnormality. Mild right hemidiaphragm elevation. IMPRESSION: 1.  No acute process or evidence of metastatic disease in the chest. 2. Right middle lobe isolated 4 mm nodule, not readily apparent on the prior. Recommend attention on follow-up. 3. Aortic atherosclerosis (ICD10-I70.0), coronary artery atherosclerosis and emphysema (ICD10-J43.9). Electronically Signed   By: Abigail Miyamoto M.D.   On: 11/23/2017 16:06   US Paracentesis  Result Date: 12/14/2017 INDICATION: Hepatocellular carcinoma and refractory ascites. EXAM: ULTRASOUND GUIDED PARACENTESIS MEDICATIONS: None. COMPLICATIONS: None immediate. PROCEDURE: Informed written consent was obtained from the patient after a discussion of the risks, benefits and alternatives to treatment. A timeout was performed prior to the initiation of the procedure. Initial ultrasound was performed to localize ascites. The right lower abdomen was prepped and draped in the usual sterile fashion. 1% lidocaine was used for local anesthesia. Following this, a 6 Fr Safe-T-Centesis catheter was introduced. An ultrasound image was saved for documentation purposes. The paracentesis was performed. The catheter was removed and a dressing was applied. The patient tolerated the procedure well without immediate post procedural complication. FINDINGS: A total of approximately 5 L of yellowish fluid was removed. IMPRESSION: Successful  ultrasound-guided paracentesis yielding 5 liters of peritoneal fluid. Electronically Signed   By: Aletta Edouard M.D.   On: 12/14/2017 14:05   US Paracentesis  Result Date: 11/24/2017 INDICATION: Ascites EXAM: ULTRASOUND GUIDED  PARACENTESIS MEDICATIONS: None. COMPLICATIONS: None immediate. PROCEDURE: Informed written consent was obtained from the patient after a discussion of the risks, benefits and alternatives to treatment. A timeout was performed prior to the initiation of the procedure. Initial ultrasound scanning demonstrates a large amount of ascites within the right lower abdominal quadrant. The right lower abdomen was prepped and draped in the usual sterile fashion. 1% lidocaine with epinephrine was used for local anesthesia. Following this, a 6 Fr Safe-T-Centesis catheter was introduced. An ultrasound image was saved for documentation purposes. The paracentesis was performed. The catheter was removed and a dressing was applied. The patient tolerated the procedure well without immediate post procedural complication. FINDINGS: A total of approximately 4.4 L of clear yellow fluid was removed. IMPRESSION: Successful ultrasound-guided paracentesis yielding 4.4 liters of peritoneal fluid. Electronically Signed   By: Marybelle Killings M.D.   On: 11/24/2017 14:28    PERFORMANCE STATUS (ECOG) : 3 - Symptomatic, >50% confined to bed  Review of Systems As noted above. Otherwise, a complete review of systems is negative.  Physical Exam General: frail appearing, bilat temporal wasting Cardiovascular: regular rate and rhythm Pulmonary: clear ant fields Abdomen: distended, firm, + fluid wave Extremities: + edema in bilat LE Skin: no rashes Neurological: Weakness but otherwise nonfocal  IMPRESSION: Patient has some abdominal pain today. Received oxycodone earlier, which helped some. Will liberalize dosing frequency of oxycodone.   Patient's abdomen appears increasingly distended. Suspect  reaccumulation of ascites. It appears that patient would benefit from therapeutic paracentesis. Last done on 11/18 with 5L removed. Case discussed with Dr. Janese Banks. Patient would benefit from Tenkhoff catheter for home management of ascites but will first need to complete antibiotics. This could be considered outpatient.   Note plan for family to meet today with hospice.   PLAN: Continue medical treatment/supportive care Consider repeat paracentesis for comfort Home with hospice when medically ready   Time Total: 30 minutes  Visit consisted of counseling and education dealing with the complex and emotionally intense issues of symptom management  and palliative care in the setting of serious and potentially life-threatening illness.Greater than 50%  of this time was spent counseling and coordinating care related to the above assessment and plan.  Signed by: Altha Harm, PhD, NP-C [phone] (Work Cell)

## 2017-12-17 NOTE — Progress Notes (Addendum)
Pt stated he wanted to be discharged today because his brother was coming in town from [employer]. He stated he was "going home whether the doctor says I can go home or not." Dr. Verdell Carmine notified, stated that pt would need to leave AMA because pt was not medically ready for d/c. Pt updated and stated that he would leave when his daughter arrives around [place] PA notified as well at pt's request.   Pt has had no complications after paracentesis. Gauze dressing CDI to abdomen.

## 2017-12-18 ENCOUNTER — Encounter: Payer: Self-pay | Admitting: Hospice and Palliative Medicine

## 2017-12-18 ENCOUNTER — Inpatient Hospital Stay: Payer: Medicare Other | Attending: Hospice and Palliative Medicine | Admitting: Hospice and Palliative Medicine

## 2017-12-18 ENCOUNTER — Telehealth: Payer: Self-pay

## 2017-12-18 ENCOUNTER — Telehealth: Payer: Self-pay | Admitting: Hospice and Palliative Medicine

## 2017-12-18 VITALS — BP 105/68 | HR 114 | Temp 98.0°F | Resp 18

## 2017-12-18 DIAGNOSIS — I252 Old myocardial infarction: Secondary | ICD-10-CM | POA: Insufficient documentation

## 2017-12-18 DIAGNOSIS — J449 Chronic obstructive pulmonary disease, unspecified: Secondary | ICD-10-CM | POA: Diagnosis not present

## 2017-12-18 DIAGNOSIS — B1910 Unspecified viral hepatitis B without hepatic coma: Secondary | ICD-10-CM

## 2017-12-18 DIAGNOSIS — Z515 Encounter for palliative care: Secondary | ICD-10-CM | POA: Diagnosis not present

## 2017-12-18 DIAGNOSIS — K219 Gastro-esophageal reflux disease without esophagitis: Secondary | ICD-10-CM | POA: Insufficient documentation

## 2017-12-18 DIAGNOSIS — G893 Neoplasm related pain (acute) (chronic): Secondary | ICD-10-CM

## 2017-12-18 DIAGNOSIS — I2510 Atherosclerotic heart disease of native coronary artery without angina pectoris: Secondary | ICD-10-CM

## 2017-12-18 DIAGNOSIS — Z79899 Other long term (current) drug therapy: Secondary | ICD-10-CM | POA: Diagnosis not present

## 2017-12-18 DIAGNOSIS — Z8719 Personal history of other diseases of the digestive system: Secondary | ICD-10-CM | POA: Diagnosis not present

## 2017-12-18 DIAGNOSIS — R188 Other ascites: Secondary | ICD-10-CM | POA: Insufficient documentation

## 2017-12-18 DIAGNOSIS — R109 Unspecified abdominal pain: Secondary | ICD-10-CM | POA: Diagnosis not present

## 2017-12-18 DIAGNOSIS — C220 Liver cell carcinoma: Secondary | ICD-10-CM | POA: Diagnosis present

## 2017-12-18 DIAGNOSIS — I81 Portal vein thrombosis: Secondary | ICD-10-CM | POA: Insufficient documentation

## 2017-12-18 DIAGNOSIS — M1990 Unspecified osteoarthritis, unspecified site: Secondary | ICD-10-CM | POA: Insufficient documentation

## 2017-12-18 DIAGNOSIS — R531 Weakness: Secondary | ICD-10-CM | POA: Insufficient documentation

## 2017-12-18 DIAGNOSIS — K7460 Unspecified cirrhosis of liver: Secondary | ICD-10-CM | POA: Diagnosis not present

## 2017-12-18 DIAGNOSIS — I255 Ischemic cardiomyopathy: Secondary | ICD-10-CM

## 2017-12-18 DIAGNOSIS — Z66 Do not resuscitate: Secondary | ICD-10-CM | POA: Insufficient documentation

## 2017-12-18 DIAGNOSIS — I509 Heart failure, unspecified: Secondary | ICD-10-CM | POA: Insufficient documentation

## 2017-12-18 LAB — BODY FLUID CULTURE: Culture: NO GROWTH

## 2017-12-18 MED ORDER — SULFAMETHOXAZOLE-TRIMETHOPRIM 800-160 MG PO TABS: 1.0000 | ORAL_TABLET | Freq: Two times a day (BID) | ORAL | 1 refills | Status: AC

## 2017-12-18 NOTE — Discharge Instructions (Signed)
Paracentesis, Care After °Refer to this sheet in the next few weeks. These instructions provide you with information about caring for yourself after your procedure. Your health care provider may also give you more specific instructions. Your treatment has been planned according to current medical practices, but problems sometimes occur. Call your health care provider if you have any problems or questions after your procedure. °What can I expect after the procedure? °After your procedure, it is common to have a small amount of clear fluid coming from the puncture site. °Follow these instructions at home: °· Return to your normal activities as told by your health care provider. Ask your health care provider what activities are safe for you. °· Take over-the-counter and prescription medicines only as told by your health care provider. °· Do not take baths, swim, or use a hot tub until your health care provider approves. °· Follow instructions from your health care provider about: °? How to take care of your puncture site. °? When and how you should change your bandage (dressing). °? When you should remove your dressing. °· Check your puncture area every day signs of infection. Watch for: °? Redness, swelling, or pain. °? Fluid, blood, or pus. °· Keep all follow-up visits as told by your health care provider. This is important. °Contact a health care provider if: °· You have redness, swelling, or pain at your puncture site. °· You start to have more clear fluid coming from your puncture site. °· You have blood or pus coming from your puncture site. °· You have chills. °· You have a fever. °Get help right away if: °· You develop chest pain or shortness of breath. °· You develop increasing pain, discomfort, or swelling in your abdomen. °· You feel dizzy or light-headed or you pass out. °This information is not intended to replace advice given to you by your health care provider. Make sure you discuss any questions you  have with your health care provider. °Document Released: 05/30/2014 Document Revised: 06/21/2015 Document Reviewed: 03/28/2014 °Elsevier Interactive Patient Education © 2018 [employer] ° °

## 2017-12-18 NOTE — Telephone Encounter (Signed)
I called and spoke with patient's daughter. She says patient is doing well. She plans to bring him this morning to the [facility]. Appointment time is 1030.

## 2017-12-18 NOTE — Telephone Encounter (Signed)
Spoke to daughter to advise of paracentesis, 12/21/2017. [facility] at 0900 for 0930 paracentesis. Daughter states understanding of date/time. Josh ordered Hospice. Form filled out and faxed to Hospice. Patient and family aware of referral.

## 2017-12-18 NOTE — Progress Notes (Signed)
[facility]  [phone] [phone]   Name: Phillip Warren Date: 12/18/2017 MRN: [id number]  DOB: 1955-07-25  Patient Care Team: Glendon Axe, MD as PCP - General (Internal Medicine) Wellington Hampshire, MD as PCP - Cardiology (Cardiology) Efrain Sella, MD as PCP - Gastroenterology (Gastroenterology) Clent Jacks, RN as Registered Nurse    REASON FOR CONSULTATION: Palliative Care consult requested for this62 y.o.malewith multiple medical problems including hepatocellular carcinoma initially diagnosed in 2016. Patient underwent ablation and then was subsequently lost to follow-up. PMH is also notable for history of cirrhosis, active hepatitis B and C, CAD, ischemic cardiomyopathy with EF of 25 to 30% per ECHO in February 2019, history of polysubstance abuse with alcohol/THC and IV drugs, history of GI bleed, and COPD. 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 Unfortunately, patient has not bee able to make these appointments.Heis now admitted 12/13/17 with sepsis from SBP. Patient left the hospital AMA on 12/18/17.  SOCIAL HISTORY:    Patient is widowed.  His wife died of cancer.  He lives alone in an apartment.  Patient has a daughter who lives nearby and is involved in his care.  Patient has 2 sons and 2 daughters.  Patient used to work as a Curator.  ADVANCE DIRECTIVES:  Does not have  CODE STATUS: DNR  PAST MEDICAL HISTORY: Past Medical History:  Diagnosis Date  . Alcohol abuse   . Anorexia    has trouble eating due to poor appetite  . Anxiety    trouble sleeping; lost two sisters and his mother close together  . Arthritis    hands, feet (burning, stinging)  . Cancer [facility])      liver lesion initial staging.  . CHF (congestive heart failure) ([place])   . Cirrhosis of liver ([place])   . Cocaine abuse ([place])   . COPD (chronic obstructive pulmonary disease) ([place])   . Coronary artery disease    Inferior ST elevation myocardial infarction in February 2019.  Cardiac catheterization showed subtotal occlusion of mid right coronary artery with severe disease affecting the LAD and left circumflex.  Successful PCI and 2 drug-eluting stent placement to the RCA.  He returned a few days after hospital discharge with stent thrombosis due to not taking any of his cardiac medications.   Marland Kitchen GERD (gastroesophageal reflux disease)   . H/O dizziness   . Headache   . Hepatitis    B and C (chronic)  . Marijuana use   . Myocardial infarction ([place])    massive MI at age 67  . Neuropathy   . Numbness and tingling    legs and feet bilat   . Pneumonia   . Repeated falls   . Shortness of breath dyspnea   . Tinnitus   . Tobacco abuse     PAST SURGICAL HISTORY:  Past Surgical History:  Procedure Laterality Date  . CARDIAC SURGERY    . COLONOSCOPY WITH PROPOFOL N/A 08/31/2014   Procedure: COLONOSCOPY WITH PROPOFOL;  Surgeon: Josefine Class, MD;  Location: [facility] ENDOSCOPY;  Service: Endoscopy;  Laterality: N/A;  . CORONARY ANGIOPLASTY     at age 47 secondary to massive MI  . CORONARY/GRAFT ACUTE MI REVASCULARIZATION N/A 03/09/2017   Procedure: Coronary/Graft Acute MI Revascularization;  Surgeon:  Wellington Hampshire, MD;  Location: [facility] CV LAB;  Service: Cardiovascular;  Laterality: N/A;  . CORONARY/GRAFT ACUTE MI REVASCULARIZATION N/A 03/16/2017   Procedure: Coronary/Graft Acute MI Revascularization;  Surgeon: Wellington Hampshire, MD;  Location: [place] CV LAB;  Service: Cardiovascular;  Laterality: N/A;  balloon only RCA  . ESOPHAGOGASTRODUODENOSCOPY (EGD) WITH PROPOFOL N/A 08/31/2014   Procedure: ESOPHAGOGASTRODUODENOSCOPY (EGD) WITH PROPOFOL;  Surgeon: Josefine Class, MD;   Location: [facility] ENDOSCOPY;  Service: Endoscopy;  Laterality: N/A;  . ESOPHAGOGASTRODUODENOSCOPY (EGD) WITH PROPOFOL N/A 01/02/2017   Procedure: ESOPHAGOGASTRODUODENOSCOPY (EGD) WITH PROPOFOL;  Surgeon: Toledo, Benay Pike, MD;  Location: [facility] ENDOSCOPY;  Service: Gastroenterology;  Laterality: N/A;  . LEFT HEART CATH AND CORONARY ANGIOGRAPHY N/A 03/09/2017   Procedure: LEFT HEART CATH AND CORONARY ANGIOGRAPHY;  Surgeon: Wellington Hampshire, MD;  Location: [place] CV LAB;  Service: Cardiovascular;  Laterality: N/A;  . LEFT HEART CATH AND CORONARY ANGIOGRAPHY N/A 03/16/2017   Procedure: LEFT HEART CATH AND CORONARY ANGIOGRAPHY;  Surgeon: Wellington Hampshire, MD;  Location: [place] CV LAB;  Service: Cardiovascular;  Laterality: N/A;    HEMATOLOGY/ONCOLOGY HISTORY:   No history exists.    ALLERGIES:  is allergic to multihance [gadobenate]; hydrocodone; other; and vicodin [hydrocodone-acetaminophen].  MEDICATIONS:  Current Outpatient Medications  Medication Sig Dispense Refill  . atorvastatin (LIPITOR) 10 MG tablet Take 1 tablet (10 mg total) by mouth daily at 6 PM. (Patient not taking: Reported on 12/13/2017) 30 tablet 3  . clopidogrel (PLAVIX) 75 MG tablet Take 1 tablet (75 mg total) by mouth daily with breakfast. (Patient not taking: Reported on 12/13/2017) 30 tablet 5  . gabapentin (NEURONTIN) 800 MG tablet Take 800 mg by mouth 4 (four) times daily.    Marland Kitchen lactulose (CHRONULAC) 10 GM/15ML solution Take 45 mLs (30 g total) by mouth 2 (two) times daily. (Patient not taking: Reported on 12/13/2017) 240 mL 0  . lisinopril (PRINIVIL,ZESTRIL) 2.5 MG tablet Take 1 tablet (2.5 mg total) by mouth daily. (Patient not taking: Reported on 12/13/2017) 30 tablet 5  . metoprolol succinate (TOPROL-XL) 25 MG 24 hr tablet Take 0.5 tablets (12.5 mg total) by mouth daily. (Patient not taking: Reported on 12/13/2017) 30 tablet 6  . Oxycodone HCl 10 MG TABS Take 1 tablet (10 mg total) by mouth every 6 (six) hours  as needed for up to 14 days. 56 tablet 0  . pantoprazole (PROTONIX) 40 MG tablet Take 1 tablet (40 mg total) by mouth daily. 30 tablet 11   No current facility-administered medications for this visit.     VITAL SIGNS: There were no vitals taken for this visit. There were no vitals filed for this visit.  Estimated body mass index is 21.32 kg/m as calculated from the following:   Height as of 12/13/17: 6' (1.829 m).   Weight as of 12/15/17: 157 lb 3 oz (71.3 kg).  LABS: CBC:    Component Value Date/Time   WBC 8.4 12/15/2017 0344   HGB 10.4 (L) 12/15/2017 0344   HGB 16.4 05/14/2014 1606   HCT 31.7 (L) 12/15/2017 0344   HCT 49.7 05/14/2014 1606   PLT 121 (L) 12/15/2017 0344   PLT 111 (L) 05/14/2014 1606   MCV 83.4 12/15/2017 0344   MCV 96 05/14/2014 1606   NEUTROABS 15.0 (H) 12/13/2017 2051   NEUTROABS 3.4 05/14/2014 1606   LYMPHSABS 1.3 12/13/2017 2051   LYMPHSABS 1.8 05/14/2014 1606   MONOABS 0.8 12/13/2017 2051   MONOABS 0.5 05/14/2014 1606  EOSABS 0.0 12/13/2017 2051   EOSABS 0.2 05/14/2014 1606   BASOSABS 0.1 12/13/2017 2051   BASOSABS 0.0 05/14/2014 1606   Comprehensive Metabolic Panel:    Component Value Date/Time   NA 131 (L) 12/15/2017 0344   NA 135 05/14/2014 1606   K 4.2 12/15/2017 0344   K 4.2 05/14/2014 1606   CL 99 12/15/2017 0344   CL 99 (L) 05/14/2014 1606   CO2 23 12/15/2017 0344   CO2 26 05/14/2014 1606   BUN 26 (H) 12/15/2017 0344   BUN 17 05/14/2014 1606   CREATININE 1.00 12/15/2017 0344   CREATININE 1.14 05/14/2014 1606   GLUCOSE 147 (H) 12/15/2017 0344   GLUCOSE 109 (H) 05/14/2014 1606   CALCIUM 9.1 12/15/2017 0344   CALCIUM 9.0 05/14/2014 1606   AST 190 (H) 12/13/2017 2051   AST 162 (H) 05/14/2014 1606   ALT 36 12/13/2017 2051   ALT 184 (H) 05/14/2014 1606   ALKPHOS 175 (H) 12/13/2017 2051   ALKPHOS 101 05/14/2014 1606   BILITOT 9.0 (H) 12/13/2017 2051   BILITOT 0.5 07/24/2017 1203   BILITOT 0.7 05/14/2014 1606   PROT 8.1  12/13/2017 2051   PROT 7.7 07/24/2017 1203   PROT 8.8 (H) 05/14/2014 1606   ALBUMIN 2.6 (L) 12/13/2017 2051   ALBUMIN 3.8 07/24/2017 1203   ALBUMIN 3.9 05/14/2014 1606    RADIOGRAPHIC STUDIES: Dg Chest 2 View  Result Date: 12/13/2017 CLINICAL DATA:  Leg swelling for 3-4 days.  Abdominal pain. EXAM: CHEST - 2 VIEW COMPARISON:  10/22/2017 FINDINGS: AP and lateral views of the chest show low volumes. There is basilar atelectasis bilaterally, left greater than right. The cardiopericardial silhouette is within normal limits for size. Bones are diffusely demineralized. Telemetry leads overlie the chest. IMPRESSION: Basilar atelectasis, left greater than right. Electronically Signed   By: Misty Stanley M.D.   On: 12/13/2017 21:36   Ct Chest Wo Contrast  Result Date: 11/23/2017 CLINICAL DATA:  Recurrent hepatocellular carcinoma. Staging. Chest pain and shortness of breath. Recent cardiac stent. Smoker. EXAM: CT CHEST WITHOUT CONTRAST TECHNIQUE: Multidetector CT imaging of the chest was performed following the standard protocol without IV contrast. COMPARISON:  Abdominal MRI 10/22/2017. Chest radiograph 10/22/2017. Most recent chest CT 08/30/2014. FINDINGS: Cardiovascular: Aortic and branch vessel atherosclerosis. Normal heart size, without pericardial effusion. Multivessel coronary artery atherosclerosis. Mediastinum/Nodes: No supraclavicular adenopathy. No mediastinal or definite hilar adenopathy, given limitations of unenhanced CT. Lungs/Pleura: No pleural fluid. Lower lobe predominant bronchial wall thickening. Minimal motion degradation in the lower chest. Mild centrilobular and paraseptal emphysema. Bibasilar scarring or subsegmental atelectasis. Interstitial thickening in the posterior left upper lobe with adjacent left major fissure thickening, similar including on image 53/3 indicative of scarring. Minimal biapical pleuroparenchymal scarring. Suspect a 4 mm right middle lobe pulmonary nodule on  image 87/3. Not readily apparent on the prior exam, possibly due to thinner slice collimation today. Upper Abdomen: Cirrhosis. Abdominal ascites. Suboptimal visualization of previously described dominant hepatic mass, secondary to noncontrast technique. Grossly normal imaged portions of the adrenal glands, left kidney, spleen, stomach. Musculoskeletal: No acute osseous abnormality. Mild right hemidiaphragm elevation. IMPRESSION: 1.  No acute process or evidence of metastatic disease in the chest. 2. Right middle lobe isolated 4 mm nodule, not readily apparent on the prior. Recommend attention on follow-up. 3. Aortic atherosclerosis (ICD10-I70.0), coronary artery atherosclerosis and emphysema (ICD10-J43.9). Electronically Signed   By: Abigail Miyamoto M.D.   On: 11/23/2017 16:06   US Paracentesis  Result Date: 12/17/2017 INDICATION: History  of cirrhosis and past sided carcinoma now with recurrent symptomatic ascites. Please perform ultrasound-guided paracentesis for therapeutic purposes. EXAM: ULTRASOUND-GUIDED PARACENTESIS COMPARISON:  Multiple previous ultrasound-guided paracenteses, most recently on 12/14/2017 yielding 5 L of peritoneal fluid. MEDICATIONS: None. COMPLICATIONS: None immediate. TECHNIQUE: Informed written consent was obtained from the patient after a discussion of the risks, benefits and alternatives to treatment. A timeout was performed prior to the initiation of the procedure. Initial ultrasound scanning demonstrates a large amount of ascites within the right lower abdominal quadrant. The right lower abdomen was prepped and draped in the usual sterile fashion. 1% lidocaine with epinephrine was used for local anesthesia. An ultrasound image was saved for documentation purposed. An 8 Fr Safe-T-Centesis catheter was introduced. The paracentesis was performed. The catheter was removed and a dressing was applied. The patient tolerated the procedure well without immediate post procedural complication.  FINDINGS: A total of approximately 6.1 liters of serous fluid was removed. IMPRESSION: Successful ultrasound-guided paracentesis yielding 6.1 liters of peritoneal fluid. Electronically Signed   By: Sandi Mariscal M.D.   On: 12/17/2017 11:44   US Paracentesis  Result Date: 12/14/2017 INDICATION: Hepatocellular carcinoma and refractory ascites. EXAM: ULTRASOUND GUIDED PARACENTESIS MEDICATIONS: None. COMPLICATIONS: None immediate. PROCEDURE: Informed written consent was obtained from the patient after a discussion of the risks, benefits and alternatives to treatment. A timeout was performed prior to the initiation of the procedure. Initial ultrasound was performed to localize ascites. The right lower abdomen was prepped and draped in the usual sterile fashion. 1% lidocaine was used for local anesthesia. Following this, a 6 Fr Safe-T-Centesis catheter was introduced. An ultrasound image was saved for documentation purposes. The paracentesis was performed. The catheter was removed and a dressing was applied. The patient tolerated the procedure well without immediate post procedural complication. FINDINGS: A total of approximately 5 L of yellowish fluid was removed. IMPRESSION: Successful ultrasound-guided paracentesis yielding 5 liters of peritoneal fluid. Electronically Signed   By: Aletta Edouard M.D.   On: 12/14/2017 14:05   US Paracentesis  Result Date: 11/24/2017 INDICATION: Ascites EXAM: ULTRASOUND GUIDED  PARACENTESIS MEDICATIONS: None. COMPLICATIONS: None immediate. PROCEDURE: Informed written consent was obtained from the patient after a discussion of the risks, benefits and alternatives to treatment. A timeout was performed prior to the initiation of the procedure. Initial ultrasound scanning demonstrates a large amount of ascites within the right lower abdominal quadrant. The right lower abdomen was prepped and draped in the usual sterile fashion. 1% lidocaine with epinephrine was used for local  anesthesia. Following this, a 6 Fr Safe-T-Centesis catheter was introduced. An ultrasound image was saved for documentation purposes. The paracentesis was performed. The catheter was removed and a dressing was applied. The patient tolerated the procedure well without immediate post procedural complication. FINDINGS: A total of approximately 4.4 L of clear yellow fluid was removed. IMPRESSION: Successful ultrasound-guided paracentesis yielding 4.4 liters of peritoneal fluid. Electronically Signed   By: Marybelle Killings M.D.   On: 11/24/2017 14:28    PERFORMANCE STATUS (ECOG) : 2 - Symptomatic, <50% confined to bed  Review of Systems As noted above. Otherwise, a complete review of systems is negative.  Physical Exam General: frail appearing, thin Cardiovascular: regular rate and rhythm Pulmonary: clear ant fields Abdomen: distended, + fluid wave Extremities: no edema Skin: no rashes Neurological: Weakness but otherwise nonfocal  IMPRESSION: Patient presents to the clinic today for follow-up.  He is accompanied by his daughter.  Patient left the hospital AMA last evening.  Today, patient  says he is feeling much better.  He is, appearing and more lucid.  Patient says he slept well last night.  He does have some abdominal pain but it sounds like this was exacerbated by his lack of pain medication at home.  Patient has a previous prescription pending at the pharmacy for oxycodone by Dr. Janese Banks.  Patient's abdomen is slightly more firm than it was yesterday following the paracentesis.  I suspect that patient has rapidly reaccumulating ascites.  We will go ahead and schedule a therapeutic paracentesis on 11/25.  Patient is interested in a Tenckhoff catheter, which would allow for home management of ascites.  Will check with GI regarding the clinical appropriateness considering recent SBP.  As patient left the hospital AMA, he discharged without prescriptions for home medications.  Med list reviewed with  nurse.  Will send a prescription for Bactrim for SBP prophylaxis.   DNR order signed  I called hospice referral and gave them a verbal order for admission. Will send faxed order later today.  PLAN: Hospice referral DNR Bactrim DS    Patient expressed understanding and was in agreement with this plan. He also understands that He can call clinic at any time with any questions, concerns, or complaints.    Time Total: 30 minutes  Visit consisted of counseling and education dealing with the complex and emotionally intense issues of symptom management and palliative care in the setting of serious and potentially life-threatening illness.Greater than 50%  of this time was spent counseling and coordinating care related to the above assessment and plan.  Signed by: Altha Harm, [place], NP-C, [place] (Work Cell)

## 2017-12-18 NOTE — Discharge Summary (Signed)
[facility] NAME: Phillip Warren    MR#:  [id number]  DATE OF BIRTH:  Mar 20, 1955  DATE OF ADMISSION:  12/13/2017 ADMITTING PHYSICIAN: Lance Coon, MD  DATE OF DISCHARGE: 12/17/2017  5:50 PM  PRIMARY CARE PHYSICIAN: Glendon Axe, MD    ADMISSION DIAGNOSIS:  Leg edema [R60.0] Generalized abdominal pain [R10.84] Elevated lactic acid level [R79.89]  DISCHARGE DIAGNOSIS:  Principal Problem:   Sepsis ([place]) Active Problems:   Arteriosclerosis of coronary artery   Cancer, hepatocellular (HCC)   COPD (chronic obstructive pulmonary disease) (HCC)   GERD (gastroesophageal reflux disease)   Protein-calorie malnutrition, severe   Palliative care encounter   Goals of care, counseling/discussion   Ascites   SECONDARY DIAGNOSIS:   Past Medical History:  Diagnosis Date  . Alcohol abuse   . Anorexia    has trouble eating due to poor appetite  . Anxiety    trouble sleeping; lost two sisters and his mother close together  . Arthritis    hands, feet (burning, stinging)  . Cancer [facility])    liver lesion initial staging.  . CHF (congestive heart failure) ([place])   . Cirrhosis of liver ([place])   . Cocaine abuse ([place])   . COPD (chronic obstructive pulmonary disease) ([place])   . Coronary artery disease    Inferior ST elevation myocardial infarction in February 2019.  Cardiac catheterization showed subtotal occlusion of mid right coronary artery with severe disease affecting the LAD and left circumflex.  Successful PCI and 2 drug-eluting stent placement to the RCA.  He returned a few days after hospital discharge with stent thrombosis due to not taking any of his cardiac medications.   [place] Kitchen GERD (gastroesophageal reflux disease)   . H/O dizziness   . Headache   . Hepatitis    B and C (chronic)  . Marijuana use   . Myocardial infarction ([place])    massive MI at age 64  . Neuropathy   . Numbness and tingling    legs and feet bilat   .  Pneumonia   . Repeated falls   . Shortness of breath dyspnea   . Tinnitus   . Tobacco abuse     HOSPITAL COURSE:   62 year old male with past medical history of hepatoma, liver cirrhosis, history of coronary artery disease, anxiety, CHF, substance abuse, GERD who presented to the hospital due to abdominal pain and worsening lower extremity swelling.  1.  Strep pneumo bacteremia- source remains unclear.  Patient's chest x-ray is negative for pneumonia. -Seen by infectious disease and patient will need 2 weeks of antibiotics. - cont. Ceftriaxone.  Echocardiogram showed no evidence of vegetation with mild LV dysfunction with EF of 40 to 45%.     2. SBP - s/p US guided paracentesis  - WBC count in the fluid was elevated and pt. Likely has SBP and cont. Ceftriaxone and can switch to Oral antibiotics upon discharge.  3.  Hepatoma with liver cirrhosis- continue octreotide, albumin. - Status post ultrasound-guided therapeutic paracentesis with 6.1 L of fluid removed today.  4.  Neuropathy-continue gabapentin.  5.  Chronic hypotension-continue midodrine. -This is secondary to patient's chronic liver disease.  Prognosis is quite poor given his liver cirrhosis, hepatoma.  Palliative care consult appreciated.  Patient is a DNR.  I am also discharged home with hospice services but patient refused hospice and home health.  Patient left AGAINST MEDICAL ADVICE on the evening of December 17, 2017.  DISCHARGE CONDITIONS:  Stable  CONSULTS OBTAINED:  Treatment Team:  Jonathon Bellows, MD Sindy Guadeloupe, MD  DRUG ALLERGIES:   Allergies  Allergen Reactions  . Multihance [Gadobenate] Hives and Shortness Of Breath  . Hydrocodone Itching and Nausea And Vomiting  . Other Itching    multihance MRI dye  . Vicodin [Hydrocodone-Acetaminophen] Nausea Only and Nausea And Vomiting    Vomiting     DISCHARGE MEDICATIONS:   Allergies as of 12/17/2017      Reactions   Multihance [gadobenate]  Hives, Shortness Of Breath   Hydrocodone Itching, Nausea And Vomiting   Other Itching   multihance MRI dye   Vicodin [hydrocodone-acetaminophen] Nausea Only, Nausea And Vomiting   Vomiting      Medication List    ASK your doctor about these medications   atorvastatin 10 MG tablet Commonly known as:  LIPITOR Take 1 tablet (10 mg total) by mouth daily at 6 PM.   clopidogrel 75 MG tablet Commonly known as:  PLAVIX Take 1 tablet (75 mg total) by mouth daily with breakfast.   gabapentin 800 MG tablet Commonly known as:  NEURONTIN Take 800 mg by mouth 4 (four) times daily.   lactulose 10 GM/15ML solution Commonly known as:  CHRONULAC Take 45 mLs (30 g total) by mouth 2 (two) times daily.   lisinopril 2.5 MG tablet Commonly known as:  PRINIVIL,ZESTRIL Take 1 tablet (2.5 mg total) by mouth daily.   metoprolol succinate 25 MG 24 hr tablet Commonly known as:  TOPROL-XL Take 0.5 tablets (12.5 mg total) by mouth daily.   Oxycodone HCl 10 MG Tabs Take 1 tablet (10 mg total) by mouth every 6 (six) hours as needed for up to 14 days.   pantoprazole 40 MG tablet Commonly known as:  PROTONIX Take 1 tablet (40 mg total) by mouth daily.         DISCHARGE INSTRUCTIONS:   DIET:  Regular diet  DISCHARGE CONDITION:  Stable  ACTIVITY:  Activity as tolerated  OXYGEN:  Home Oxygen: No.   Oxygen Delivery: room air  DISCHARGE LOCATION:  Home - AGAINST Medical Advice   If you experience worsening of your admission symptoms, develop shortness of breath, life threatening emergency, suicidal or homicidal thoughts you must seek medical attention immediately by calling 911 or calling your MD immediately  if symptoms less severe.  You Must read complete instructions/literature along with all the possible adverse reactions/side effects for all the Medicines you take and that have been prescribed to you. Take any new Medicines after you have completely understood and accpet all the  possible adverse reactions/side effects.   Please note  You were cared for by a hospitalist during your hospital stay. If you have any questions about your discharge medications or the care you received while you were in the hospital after you are discharged, you can call the unit and asked to speak with the hospitalist on call if the hospitalist that took care of you is not available. Once you are discharged, your primary care physician will handle any further medical issues. Please note that NO REFILLS for any discharge medications will be authorized once you are discharged, as it is imperative that you return to your primary care physician (or establish a relationship with a primary care physician if you do not have one) for your aftercare needs so that they can reassess your need for medications and monitor your lab values.    DATA REVIEW:   CBC Recent Labs  Lab 12/15/17 0344  WBC 8.4  HGB 10.4*  HCT 31.7*  PLT 121*    Chemistries  Recent Labs  Lab 12/13/17 2051  12/15/17 0344  NA 132*   < > 131*  K 4.9   < > 4.2  CL 97*   < > 99  CO2 20*   < > 23  GLUCOSE 168*   < > 147*  BUN 28*   < > 26*  CREATININE 1.35*   < > 1.00  CALCIUM 9.9   < > 9.1  AST 190*  --   --   ALT 36  --   --   ALKPHOS 175*  --   --   BILITOT 9.0*  --   --    < > = values in this interval not displayed.    Cardiac Enzymes No results for input(s): TROPONINI in the last 168 hours.  Microbiology Results  Results for orders placed or performed during the hospital encounter of 12/13/17  Culture, blood (Routine x 2)     Status: Abnormal   Collection Time: 12/13/17  8:51 PM  Result Value Ref Range Status   Specimen Description   Final    BLOOD RIGHT ANTECUBITAL Performed at [facility], [address] [place], [place] [address]    Special Requests   Final    BOTTLES DRAWN AEROBIC AND ANAEROBIC Blood Culture results may not be optimal due to an excessive volume of blood received in  culture bottles Performed at [facility], [address], [place], [place] [address]    Culture  Setup Time   Final    GRAM POSITIVE COCCI AEROBIC BOTTLE ONLY CRITICAL VALUE NOTED.  VALUE IS CONSISTENT WITH PREVIOUSLY REPORTED AND CALLED VALUE. Performed at [facility], [address] [place], [place] [address]    Culture (A)  Final    STREPTOCOCCUS PNEUMONIAE SUSCEPTIBILITIES PERFORMED ON PREVIOUS CULTURE WITHIN THE LAST 5 DAYS. Performed at [facility], [place] [address], [place], [place] [address]    Report Status 12/16/2017 FINAL  Final  Culture, blood (Routine x 2)     Status: Abnormal   Collection Time: 12/13/17  8:51 PM  Result Value Ref Range Status   Specimen Description   Final    BLOOD LEFT ANTECUBITAL Performed at [facility], [address] [place], [place] [address]    Special Requests   Final    BOTTLES DRAWN AEROBIC ONLY Blood Culture adequate volume Performed at [facility], [address], [place], [place] [address]    Culture  Setup Time   Final    GRAM POSITIVE COCCI AEROBIC BOTTLE ONLY CRITICAL RESULT CALLED TO, READ BACK BY AND VERIFIED WITH: Hassell AT 1126 12/14/17 SDR Performed at [facility], [place] [address], [place], [place] [address]    Culture STREPTOCOCCUS PNEUMONIAE (A)  Final   Report Status 12/16/2017 FINAL  Final   Organism ID, Bacteria STREPTOCOCCUS PNEUMONIAE  Final      Susceptibility   Streptococcus pneumoniae - MIC*    ERYTHROMYCIN >=8 RESISTANT Resistant     LEVOFLOXACIN 0.5 SENSITIVE Sensitive     VANCOMYCIN 0.5 SENSITIVE Sensitive     PENICILLIN (non-meningitis) <=0.06 SENSITIVE Sensitive     CEFTRIAXONE (non-meningitis) <=0.12 SENSITIVE Sensitive     * STREPTOCOCCUS PNEUMONIAE  Blood Culture ID Panel (Reflexed)     Status: Abnormal   Collection Time: 12/13/17  8:51 PM  Result Value Ref Range Status   Enterococcus species NOT  DETECTED NOT DETECTED Final   Listeria  monocytogenes NOT DETECTED NOT DETECTED Final   Staphylococcus species NOT DETECTED NOT DETECTED Final   Staphylococcus aureus (BCID) NOT DETECTED NOT DETECTED Final   Streptococcus species DETECTED (A) NOT DETECTED Final    Comment: CRITICAL RESULT CALLED TO, READ BACK BY AND VERIFIED WITH:  KAREN HAYES AT 1126 12/14/17 SDR    Streptococcus agalactiae NOT DETECTED NOT DETECTED Final   Streptococcus pneumoniae DETECTED (A) NOT DETECTED Final    Comment: CRITICAL RESULT CALLED TO, READ BACK BY AND VERIFIED WITH:  KAREN HAYES AT 1126 12/14/17 SDR    Streptococcus pyogenes NOT DETECTED NOT DETECTED Final   Acinetobacter baumannii NOT DETECTED NOT DETECTED Final   Enterobacteriaceae species NOT DETECTED NOT DETECTED Final   Enterobacter cloacae complex NOT DETECTED NOT DETECTED Final   Escherichia coli NOT DETECTED NOT DETECTED Final   Klebsiella oxytoca NOT DETECTED NOT DETECTED Final   Klebsiella pneumoniae NOT DETECTED NOT DETECTED Final   Proteus species NOT DETECTED NOT DETECTED Final   Serratia marcescens NOT DETECTED NOT DETECTED Final   Haemophilus influenzae NOT DETECTED NOT DETECTED Final   Neisseria meningitidis NOT DETECTED NOT DETECTED Final   Pseudomonas aeruginosa NOT DETECTED NOT DETECTED Final   Candida albicans NOT DETECTED NOT DETECTED Final   Candida glabrata NOT DETECTED NOT DETECTED Final   Candida krusei NOT DETECTED NOT DETECTED Final   Candida parapsilosis NOT DETECTED NOT DETECTED Final   Candida tropicalis NOT DETECTED NOT DETECTED Final    Comment: Performed at [facility] [address] [place], [place] [address]  Body fluid culture     Status: None   Collection Time: 12/13/17 10:12 PM  Result Value Ref Range Status   Specimen Description   Final    FLUID PERITONEAL Performed at [facility], [address] [place], [place] [address]    Special Requests   Final    NONE Performed at [facility], [address]  [place], [place] [address]    Gram Stain   Final    WBC PRESENT, PREDOMINANTLY PMN NO ORGANISMS SEEN CYTOSPIN SMEAR    Culture   Final    NO GROWTH 3 DAYS Performed at [facility], [place] [address], [place], [place] [address]    Report Status 12/17/2017 FINAL  Final  Body fluid culture     Status: None   Collection Time: 12/14/17  1:15 PM  Result Value Ref Range Status   Specimen Description   Final    PERITONEAL Performed at [facility], [address], [place], [place] [address]    Special Requests   Final    NONE Performed at [facility], [address] [place], [place] [address]    Gram Stain   Final    ABUNDANT WBC PRESENT, PREDOMINANTLY PMN NO ORGANISMS SEEN    Culture   Final    NO GROWTH 3 DAYS Performed at [facility], [place] [address], [place], [place] [address]    Report Status 12/18/2017 FINAL  Final  CULTURE, BLOOD (ROUTINE X 2) w Reflex to ID Panel     Status: None (Preliminary result)   Collection Time: 12/16/17 12:07 AM  Result Value Ref Range Status   Specimen Description BLOOD BLOOD RIGHT FOREARM  Final   Special Requests   Final    BOTTLES DRAWN AEROBIC AND ANAEROBIC Blood Culture adequate volume   Culture   Final    NO GROWTH  2 DAYS Performed at [facility], [address] [place], [place] [address]    Report Status PENDING  Incomplete  CULTURE, BLOOD (ROUTINE X 2) w Reflex to ID Panel     Status: None (Preliminary result)   Collection Time: 12/16/17 12:16 AM  Result Value Ref Range Status   Specimen Description BLOOD BLOOD LEFT ARM  Final   Special Requests   Final    BOTTLES DRAWN AEROBIC AND ANAEROBIC Blood Culture adequate volume   Culture   Final    NO GROWTH 2 DAYS Performed at [facility], [address], [place], [place] [address]    Report Status PENDING  Incomplete    RADIOLOGY:  US Paracentesis  Result Date: 12/17/2017 INDICATION: History of cirrhosis and past sided carcinoma  now with recurrent symptomatic ascites. Please perform ultrasound-guided paracentesis for therapeutic purposes. EXAM: ULTRASOUND-GUIDED PARACENTESIS COMPARISON:  Multiple previous ultrasound-guided paracenteses, most recently on 12/14/2017 yielding 5 L of peritoneal fluid. MEDICATIONS: None. COMPLICATIONS: None immediate. TECHNIQUE: Informed written consent was obtained from the patient after a discussion of the risks, benefits and alternatives to treatment. A timeout was performed prior to the initiation of the procedure. Initial ultrasound scanning demonstrates a large amount of ascites within the right lower abdominal quadrant. The right lower abdomen was prepped and draped in the usual sterile fashion. 1% lidocaine with epinephrine was used for local anesthesia. An ultrasound image was saved for documentation purposed. An 8 Fr Safe-T-Centesis catheter was introduced. The paracentesis was performed. The catheter was removed and a dressing was applied. The patient tolerated the procedure well without immediate post procedural complication. FINDINGS: A total of approximately 6.1 liters of serous fluid was removed. IMPRESSION: Successful ultrasound-guided paracentesis yielding 6.1 liters of peritoneal fluid. Electronically Signed   By: Sandi Mariscal M.D.   On: 12/17/2017 11:44      Management plans discussed with the patient, family and they are in agreement.  CODE STATUS:  Code Status History    Date Active Date Inactive Code Status Order ID Comments User Context   12/15/2017 1608 12/17/2017 2306 DNR [id number]  Borders, Kirt Boys, NP Inpatient  Questions for Most Recent Historical Code Status (Order [id number])    Question Answer Comment   In the event of cardiac or respiratory ARREST Do not call a "code blue"    In the event of cardiac or respiratory ARREST Do not perform Intubation, CPR, defibrillation or ACLS    In the event of cardiac or respiratory ARREST Use medication by any route, position, wound  care, and other measures to relive pain and suffering. May use oxygen, suction and manual treatment of airway obstruction as needed for comfort.       TOTAL TIME TAKING CARE OF THIS PATIENT: 40 minutes.    Henreitta Leber M.D on 12/18/2017 at 4:01 PM  Between 7am to 6pm - Pager - [phone]  After 6pm go to [email or web address] - Proofreader  [facility]  Office  [phone]  CC: Primary care physician; Glendon Axe, MD

## 2017-12-18 NOTE — Progress Notes (Signed)
Pt here after being in hospital with ascites and pain. And leg swelling. He is to see Josh with palliative care today

## 2017-12-19 LAB — CHOLESTEROL, BODY FLUID: Cholesterol, Fluid: 11 mg/dL

## 2017-12-20 ENCOUNTER — Other Ambulatory Visit: Payer: Self-pay | Admitting: *Deleted

## 2017-12-20 MED ORDER — ALBUMIN HUMAN 25 % IV SOLN
8.0000 g | Freq: Once | INTRAVENOUS | Status: AC
  Administered 2017-12-21: 8 g via INTRAVENOUS
  Filled 2017-12-20: qty 100

## 2017-12-21 ENCOUNTER — Other Ambulatory Visit: Payer: Self-pay | Admitting: Hospice and Palliative Medicine

## 2017-12-21 ENCOUNTER — Ambulatory Visit
Admission: RE | Admit: 2017-12-21 | Discharge: 2017-12-21 | Disposition: A | Discharge status: Home / Self Care | Source: Ambulatory Visit | Attending: Oncology | Admitting: Oncology

## 2017-12-21 VITALS — BP 99/69 | HR 122 | Temp 97.8°F | Resp 16

## 2017-12-21 DIAGNOSIS — R188 Other ascites: Secondary | ICD-10-CM | POA: Diagnosis present

## 2017-12-21 DIAGNOSIS — K7460 Unspecified cirrhosis of liver: Secondary | ICD-10-CM | POA: Insufficient documentation

## 2017-12-21 DIAGNOSIS — C220 Liver cell carcinoma: Secondary | ICD-10-CM | POA: Diagnosis present

## 2017-12-21 DIAGNOSIS — K7031 Alcoholic cirrhosis of liver with ascites: Secondary | ICD-10-CM

## 2017-12-21 DIAGNOSIS — G893 Neoplasm related pain (acute) (chronic): Secondary | ICD-10-CM

## 2017-12-21 LAB — CULTURE, BLOOD (ROUTINE X 2)
CULTURE: NO GROWTH
Culture: NO GROWTH
Special Requests: ADEQUATE
Special Requests: ADEQUATE

## 2017-12-21 MED ORDER — OXYCODONE HCL 5 MG PO TABS
10.0000 mg | ORAL_TABLET | Freq: Once | ORAL | Status: AC
  Administered 2017-12-21: 10 mg via ORAL

## 2017-12-21 MED ORDER — GABAPENTIN 800 MG PO TABS: 800.0000 mg | ORAL_TABLET | Freq: Three times a day (TID) | ORAL | 1 refills | Status: AC

## 2017-12-21 MED ORDER — ALBUMIN HUMAN 25 % IV SOLN
8.0000 g | Freq: Once | INTRAVENOUS | Status: AC
  Administered 2017-12-21: 8 g via INTRAVENOUS

## 2017-12-21 MED ORDER — ALBUMIN HUMAN 25 % IV SOLN
INTRAVENOUS | Status: AC
  Administered 2017-12-21: 8 g via INTRAVENOUS
  Filled 2017-12-21: qty 100

## 2017-12-21 NOTE — Progress Notes (Signed)
I spoke with Judeen Hammans, RN for Dr. Janese Banks at [phone] was patient reports 7 out of 10 generalized pain. He takes oxycodone 10mg  q6hrs PRN. Last dose at 5:30am today. Patient requesting oxycodone while in SDS. Judeen Hammans states she will put in pain medication order per MD.

## 2017-12-21 NOTE — Progress Notes (Signed)
I spoke with Judeen Hammans, RN for Dr. Janese Banks. Reviewed current VS as SBP 99, HR 123, O2 100%. She reviewed with Dr. Janese Banks who instructs patient is stable to discharge home.

## 2017-12-21 NOTE — Progress Notes (Signed)
I spoke with patient's daughter. She is requesting refill of his gabapentin. He apparently has yet to start taking Bactrim and hasn't yet picked it up from the pharmacy.   I called and spoke with the referral team at hospice. Patient was admitted to hospice over the weekend. Apparently, patient told them that he wanted to be a full code and intubated for a day. This is despite [place] having previously completed a MOST form and patient stating several times that he would not want to be resuscitated. I called and left a message for patient's hospice nurse - Betsy at [phone].

## 2017-12-28 ENCOUNTER — Telehealth: Payer: Self-pay | Admitting: Oncology

## 2017-12-28 NOTE — Telephone Encounter (Signed)
Hospice RN [facility] called anwsering service.  Patient passed away on 10:38AM.

## 2018-01-27 DEATH — deceased

## 2019-06-25 IMAGING — US US PARACENTESIS
1 series · 5 of 5 positions shown · non-contrast
Comparison: Multiple previous ultrasound-guided paracenteses, most
recently on 12/14/2017 yielding 5 L of peritoneal fluid.

MEDICATIONS:
None.

COMPLICATIONS:
None immediate.

INDICATION: History of cirrhosis and past sided carcinoma now with recurrent
symptomatic ascites. Please perform ultrasound-guided paracentesis
for therapeutic purposes.

EXAM:
ULTRASOUND-GUIDED PARACENTESIS
TECHNIQUE: Informed written consent was obtained from the patient after a
discussion of the risks, benefits and alternatives to treatment. A
timeout was performed prior to the initiation of the procedure.

[Series 1: us paracentesis · 0.25mm/px · 5 of 5 slices shown]
[im 1/5]
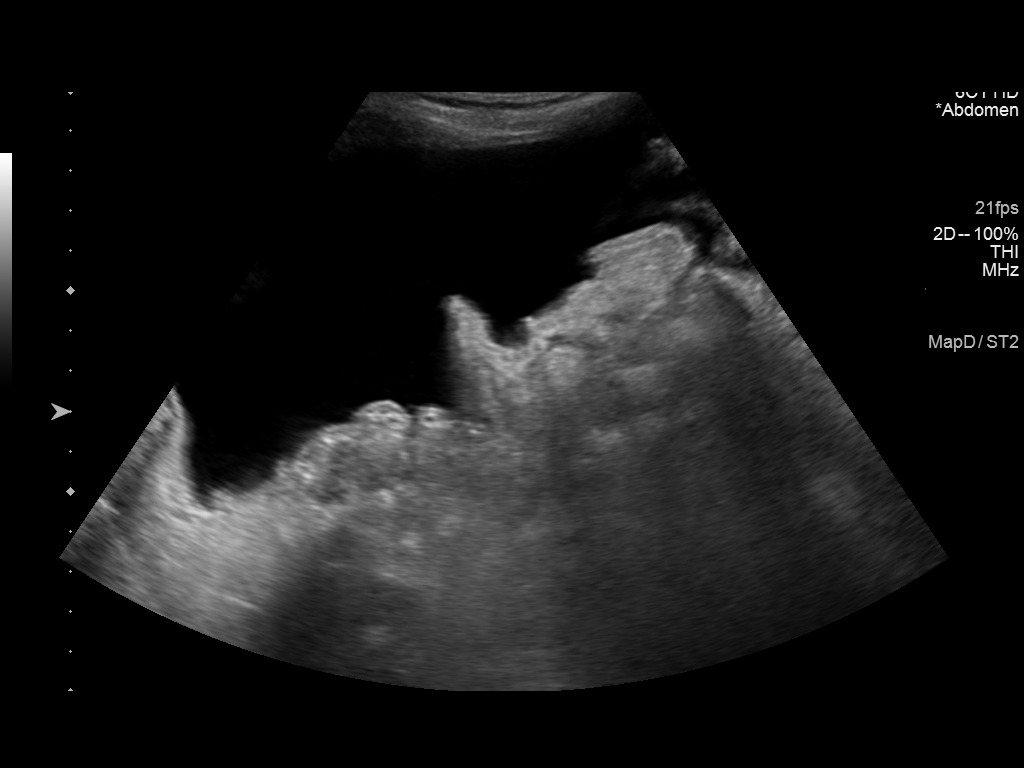
[im 2/5]
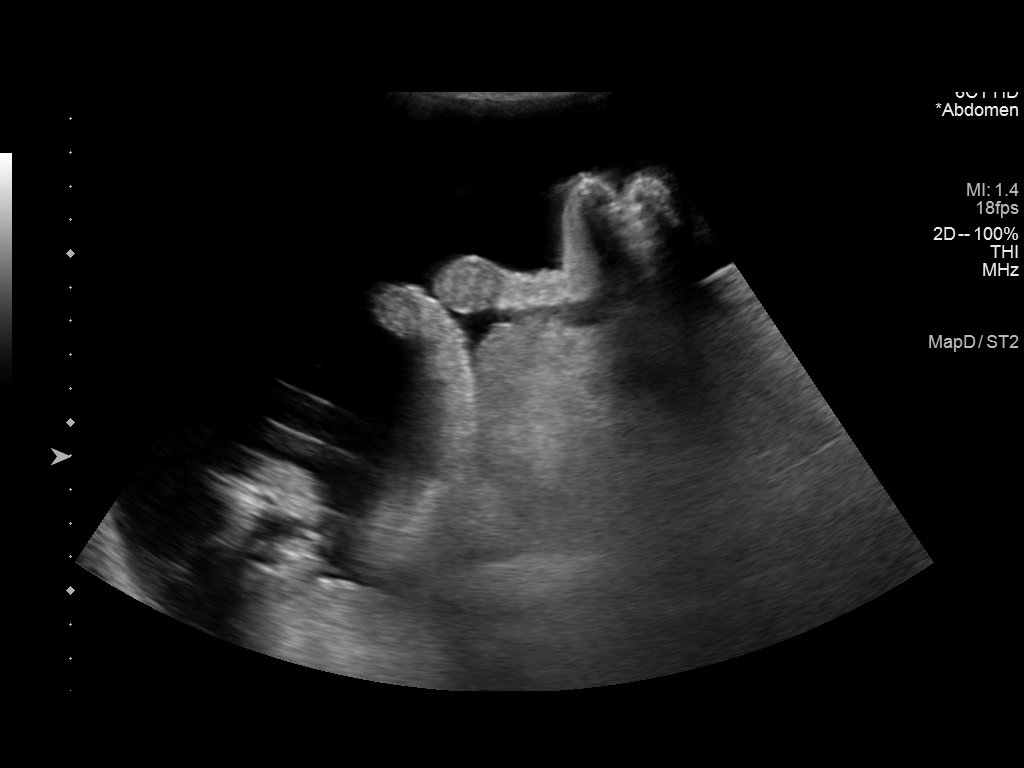
[im 3/5]
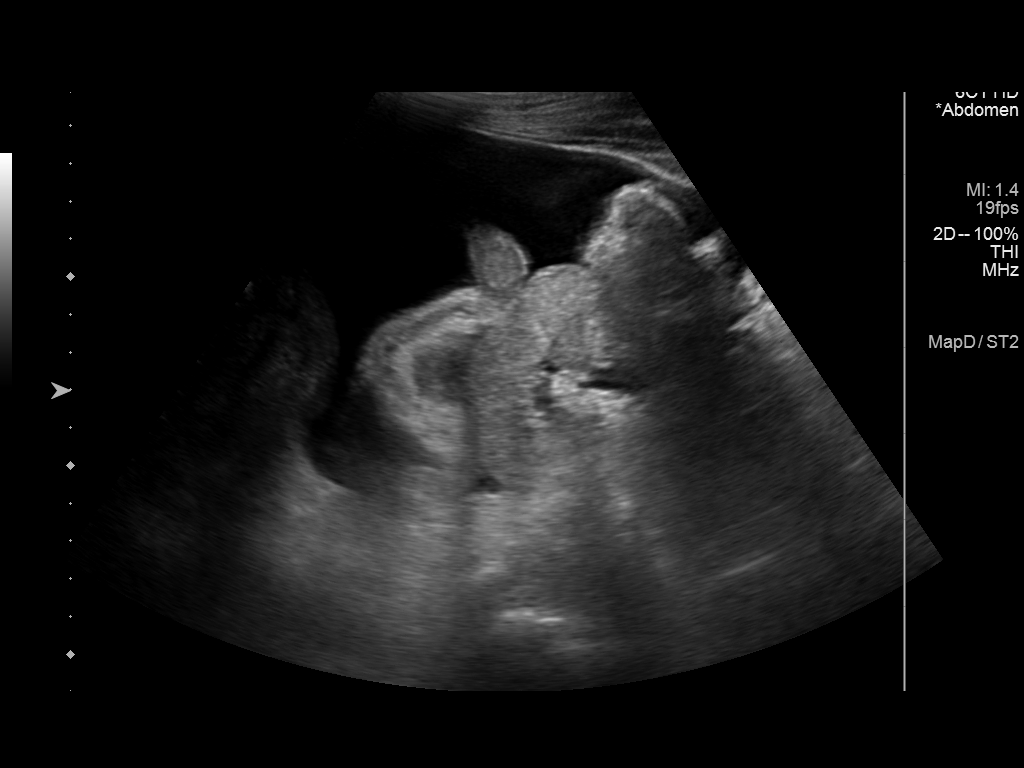
[im 4/5]
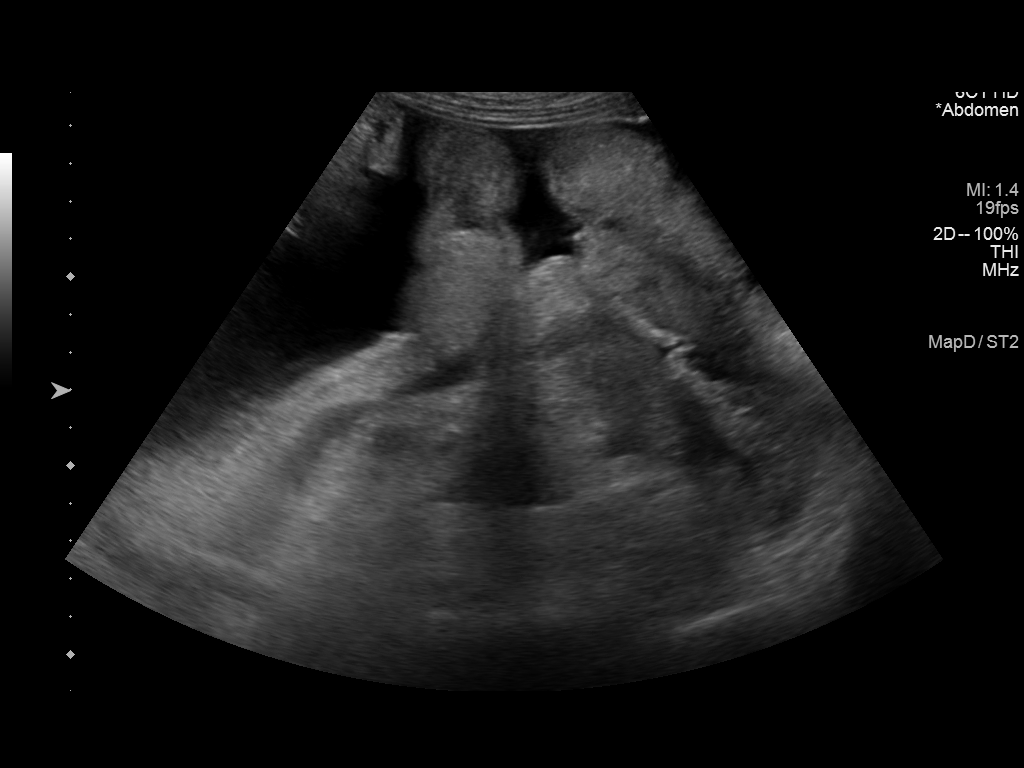
[im 5/5]
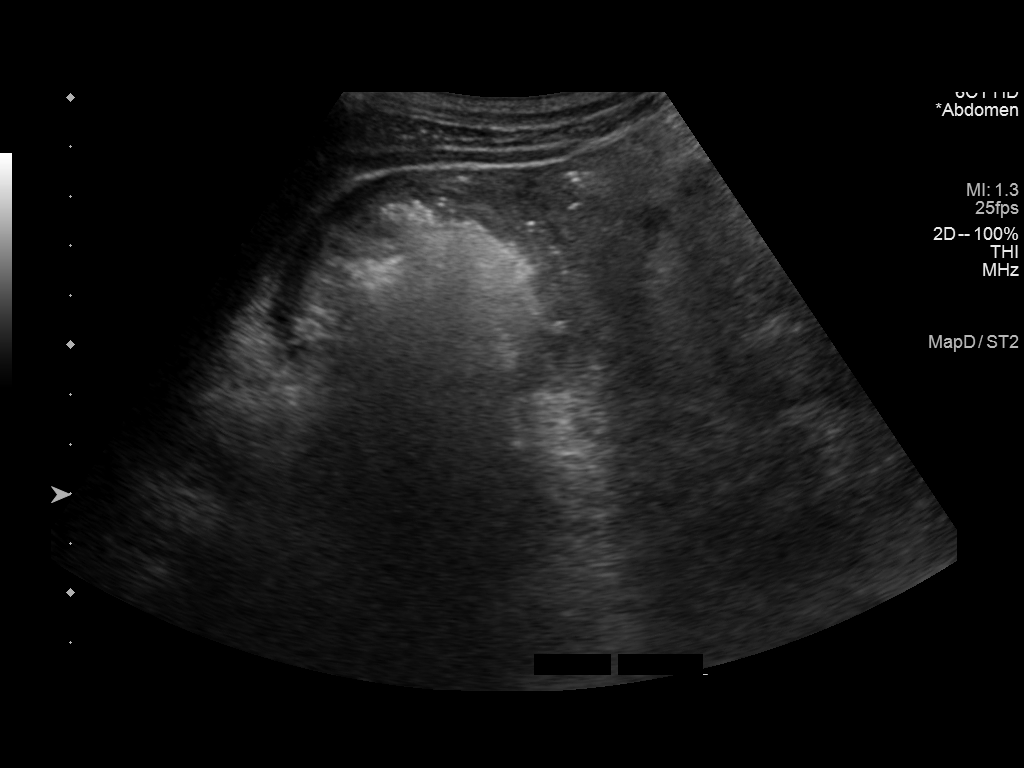

[5 of 5 positions shown; findings below may reference images not displayed]

Initial ultrasound scanning demonstrates a large amount of ascites
within the right lower abdominal quadrant. The right lower abdomen
was prepped and draped in the usual sterile fashion. 1% lidocaine
with epinephrine was used for local anesthesia. An ultrasound image
was saved for documentation purposed. An 8 Fr Safe-T-Centesis
catheter was introduced. The paracentesis was performed. The
catheter was removed and a dressing was applied. The patient
tolerated the procedure well without immediate post procedural
complication.
FINDINGS: A total of approximately 6.1 liters of serous fluid was removed.
IMPRESSION: Successful ultrasound-guided paracentesis yielding 6.1 liters of
peritoneal fluid.

## 2020-09-06 IMAGING — US US PARACENTESIS
1 series · 7 of 7 positions shown · non-contrast
Comparison: none

INDICATION: Ascites

[Series 1: us paracentesis · 0.26mm/px · 7 of 7 slices shown]
[im 1/7]
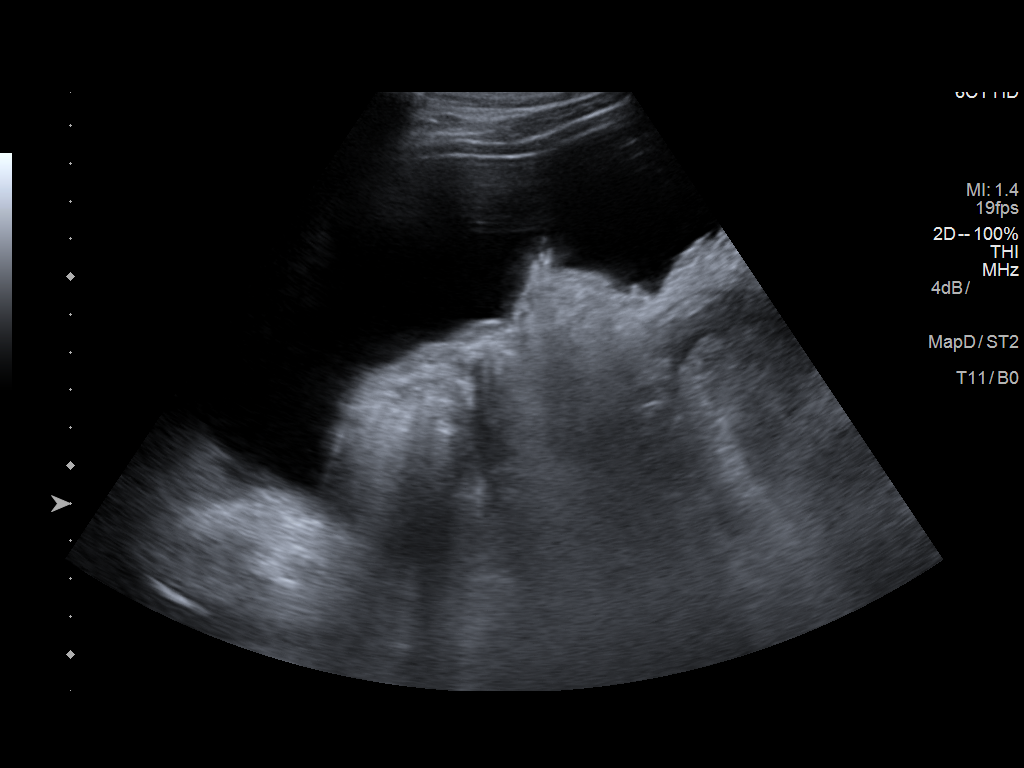
[im 2/7]
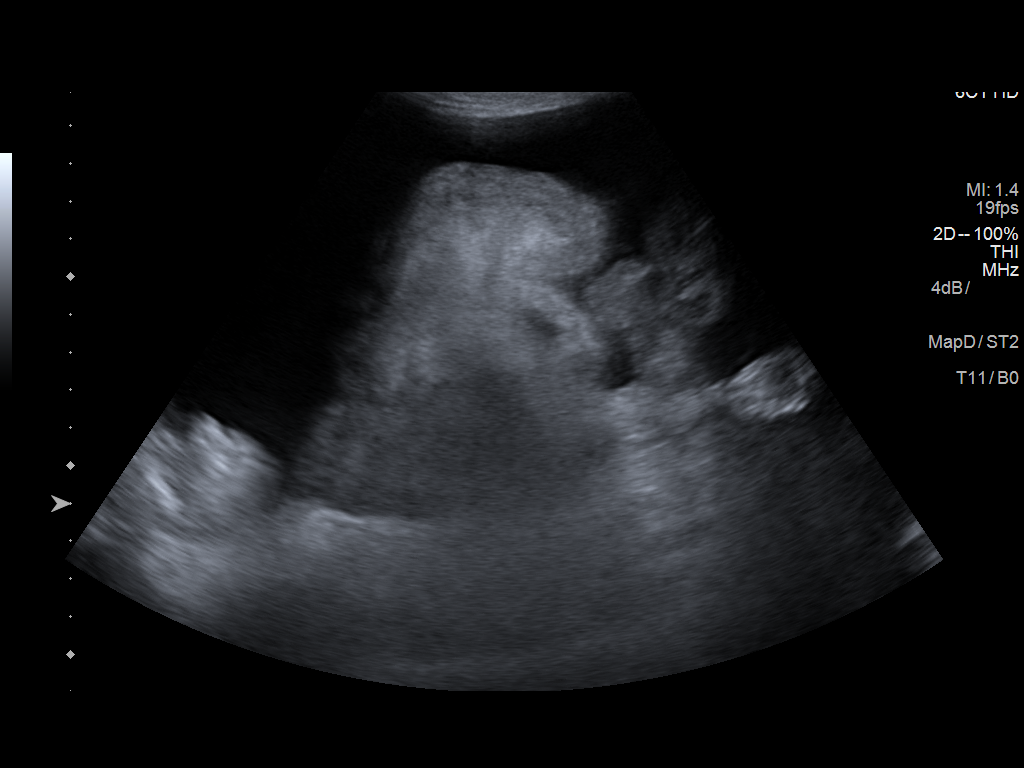
[im 3/7]
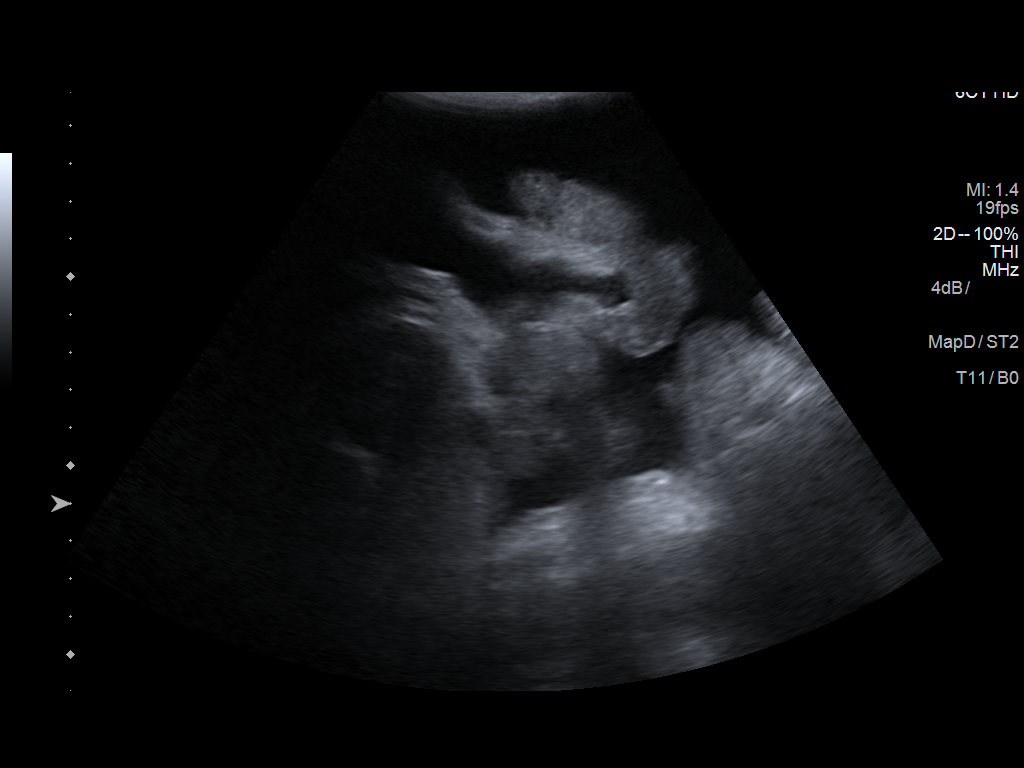
[im 4/7]
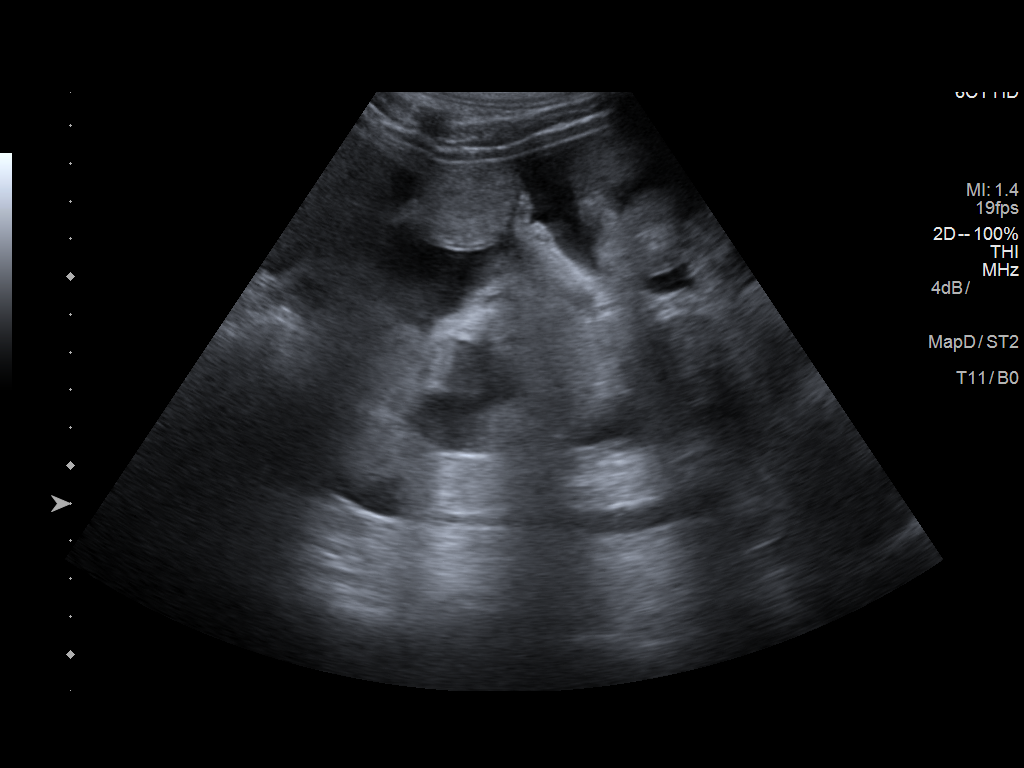
[im 5/7]
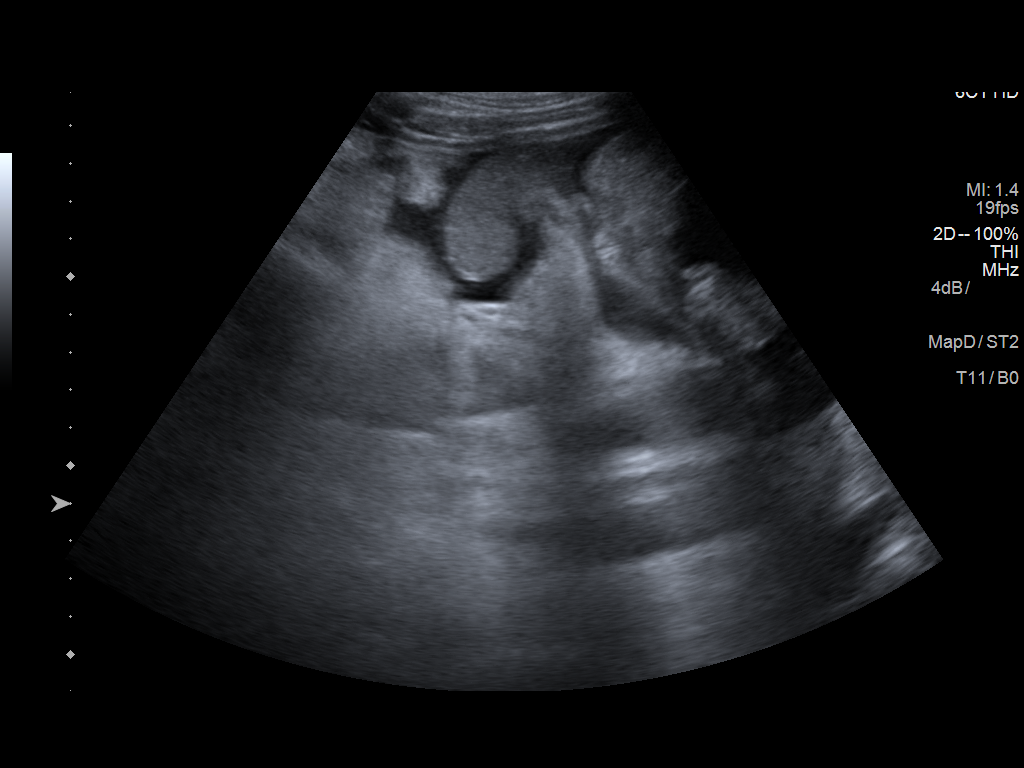
[im 6/7]
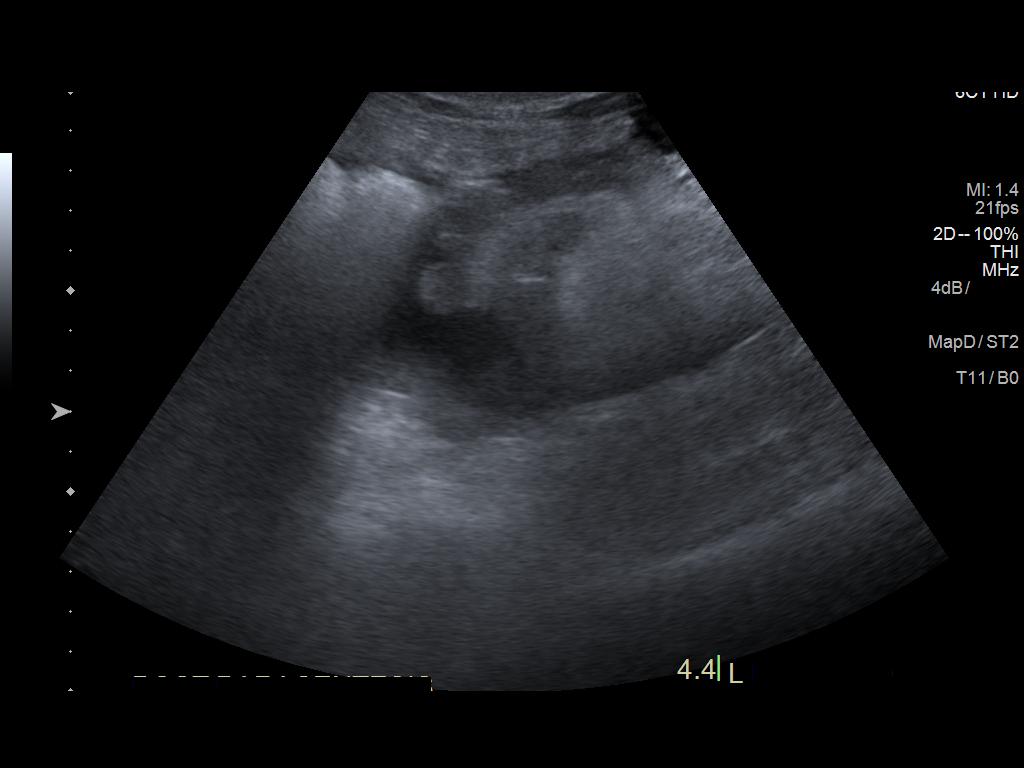
[im 7/7]
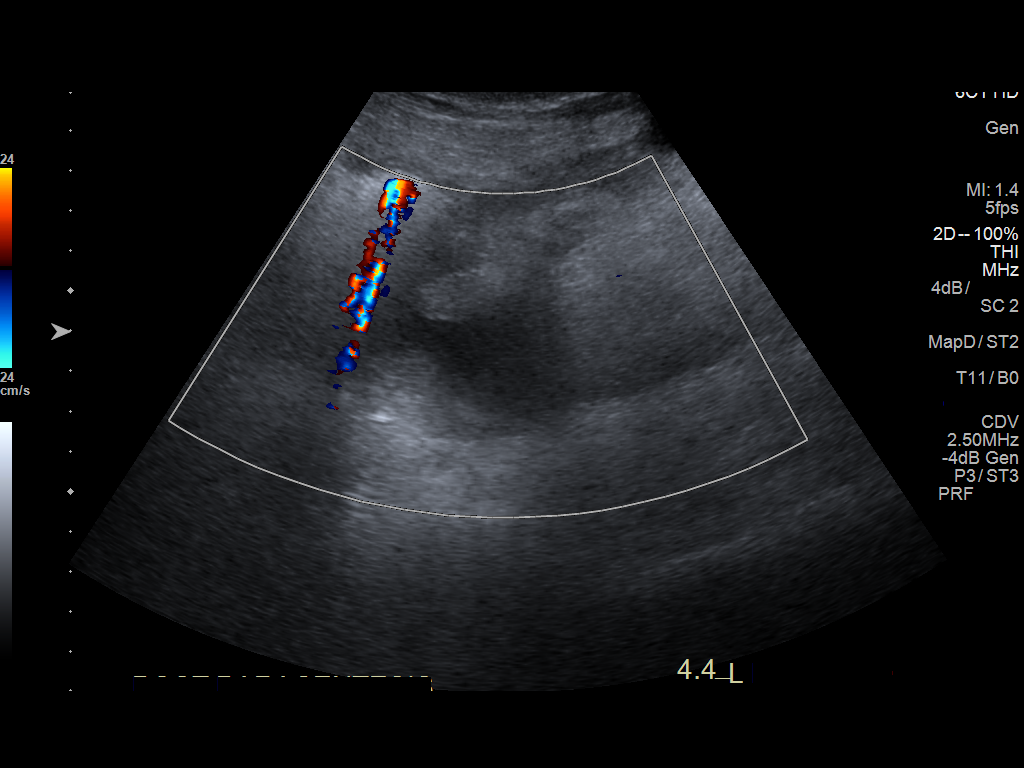

[7 of 7 positions shown; findings below may reference images not displayed]

EXAM:
ULTRASOUND GUIDED  PARACENTESIS

MEDICATIONS:
None.

COMPLICATIONS:
None immediate.

PROCEDURE:
Informed written consent was obtained from the patient after a
discussion of the risks, benefits and alternatives to treatment. A
timeout was performed prior to the initiation of the procedure.

Initial ultrasound scanning demonstrates a large amount of ascites
within the right lower abdominal quadrant. The right lower abdomen
was prepped and draped in the usual sterile fashion. 1% lidocaine
with epinephrine was used for local anesthesia.

Following this, a 6 Fr Safe-T-Centesis catheter was introduced. An
ultrasound image was saved for documentation purposes. The
paracentesis was performed. The catheter was removed and a dressing
was applied. The patient tolerated the procedure well without
immediate post procedural complication.
FINDINGS: A total of approximately 4.4 L of clear yellow fluid was removed.
IMPRESSION: Successful ultrasound-guided paracentesis yielding 4.4 liters of
peritoneal fluid.

## 2020-09-26 IMAGING — US US PARACENTESIS
1 series · 8 of 8 positions shown · non-contrast
Comparison: none

INDICATION: Hepatocellular carcinoma and refractory ascites.

[Series 1: us paracentesis · 0.26mm/px · 8 of 8 slices shown]
[im 1/8]
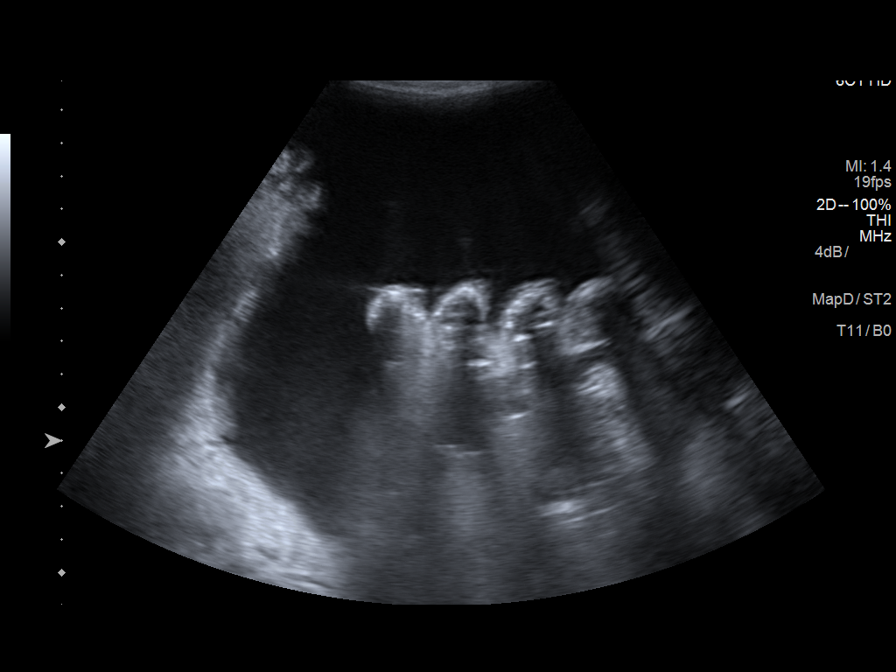
[im 2/8]
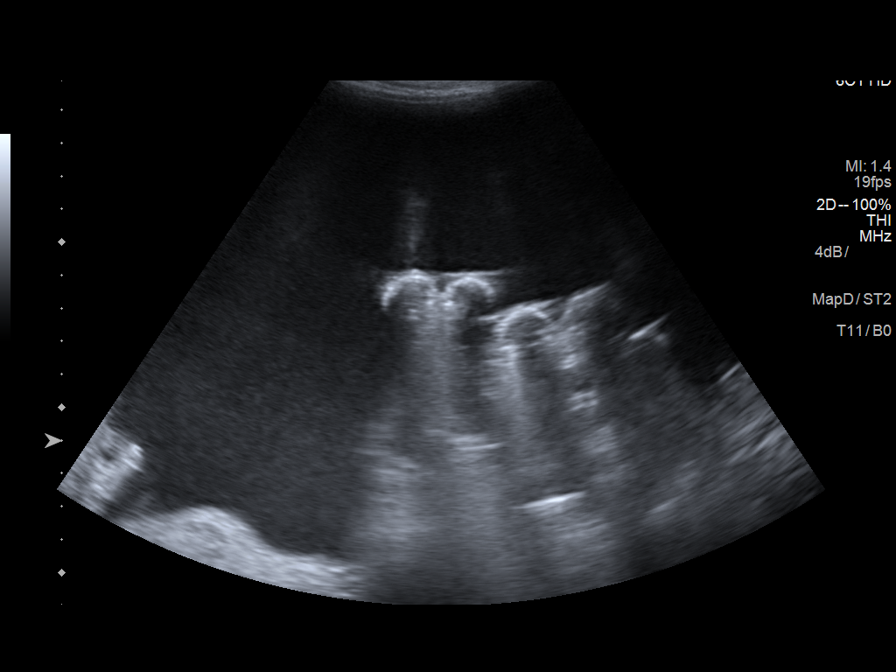
[im 3/8]
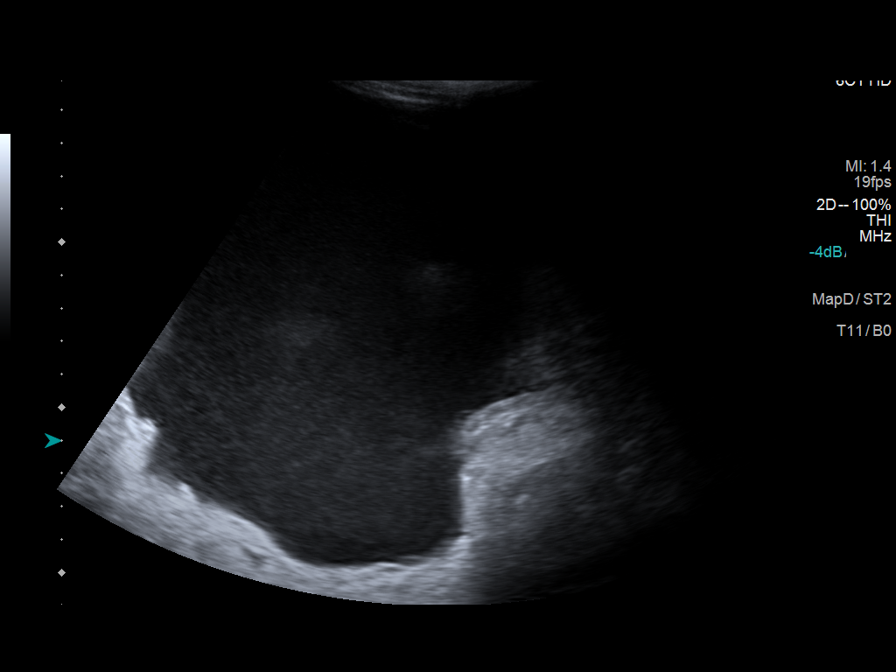
[im 4/8]
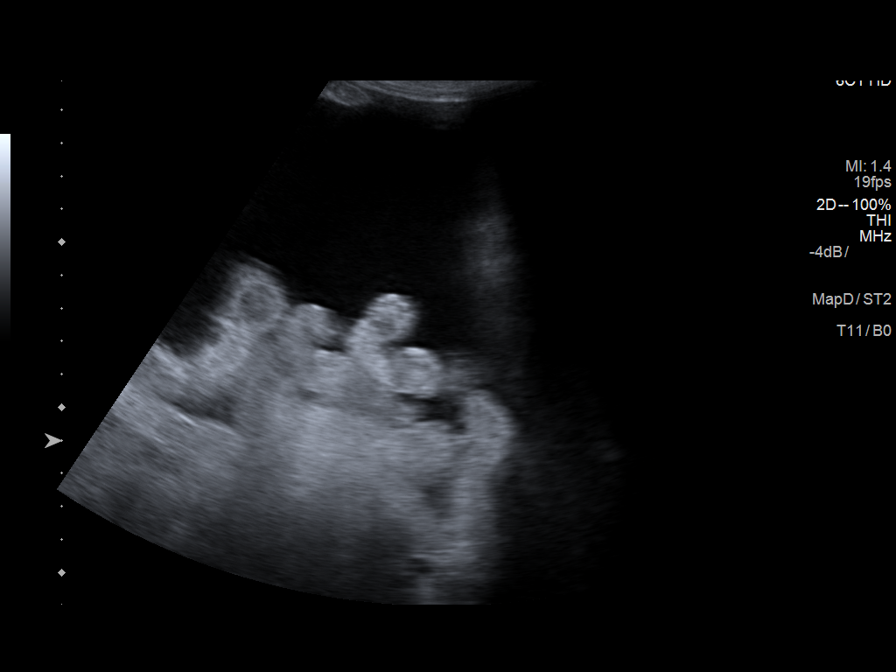
[im 5/8]
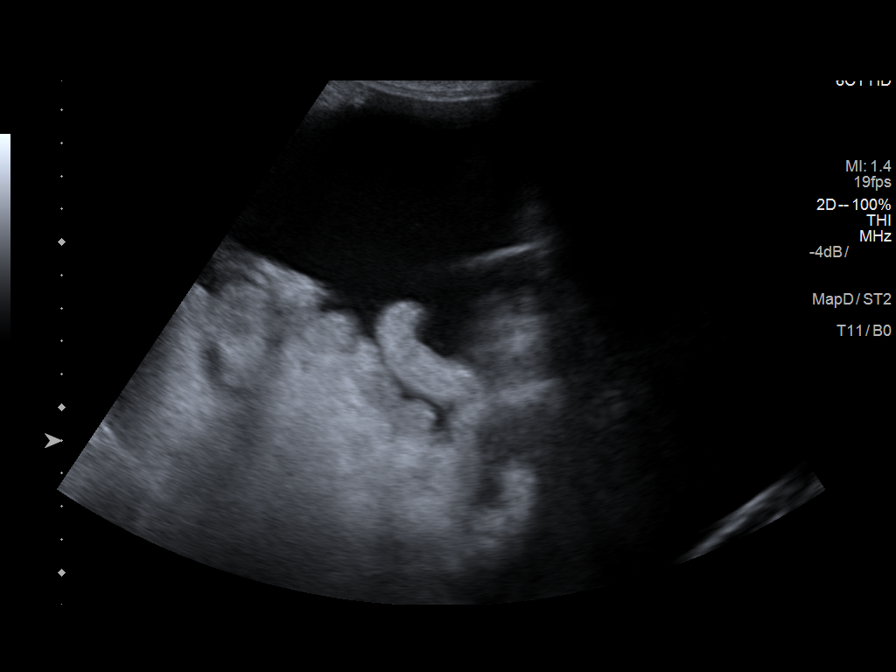
[im 6/8]
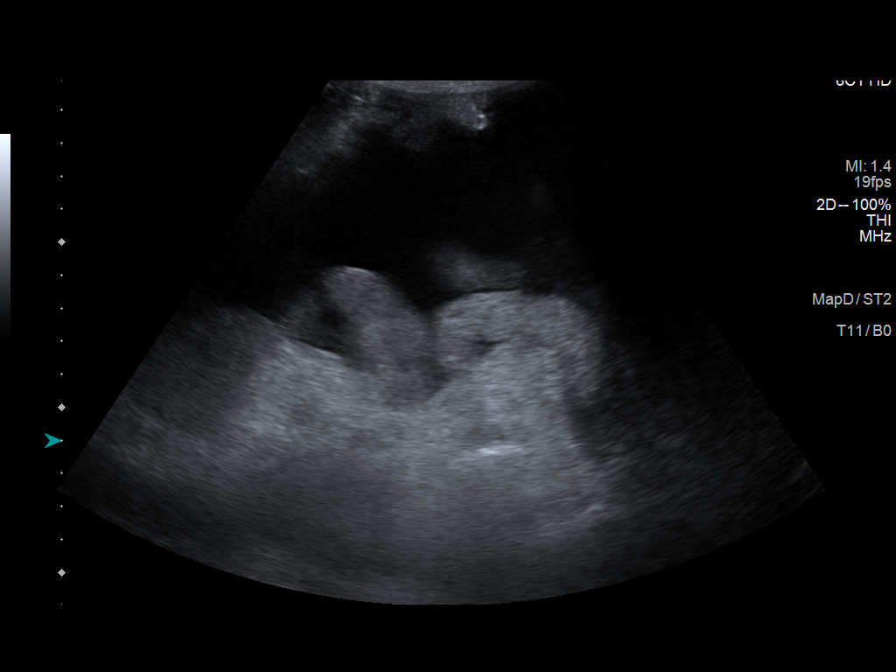
[im 7/8]
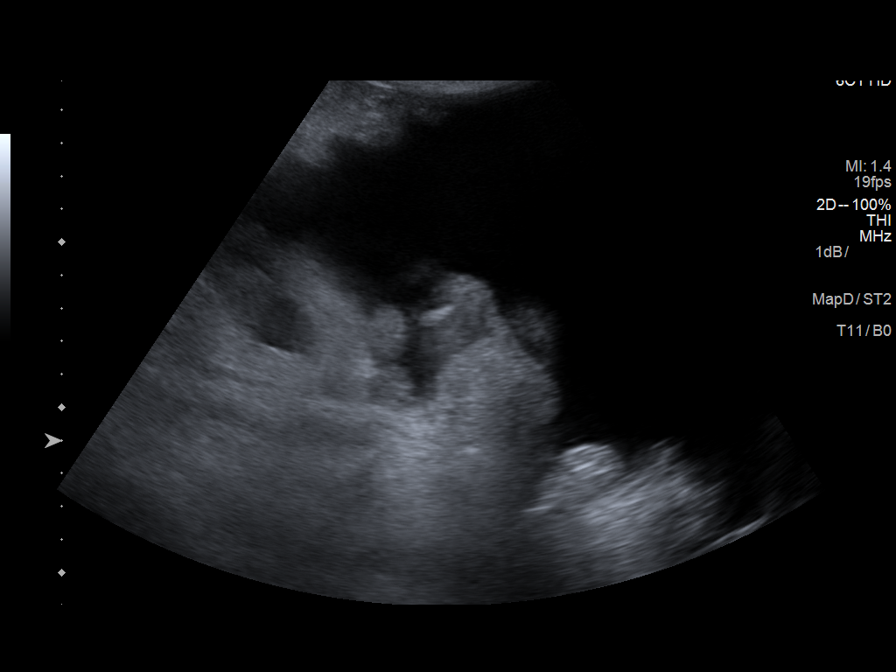
[im 8/8]
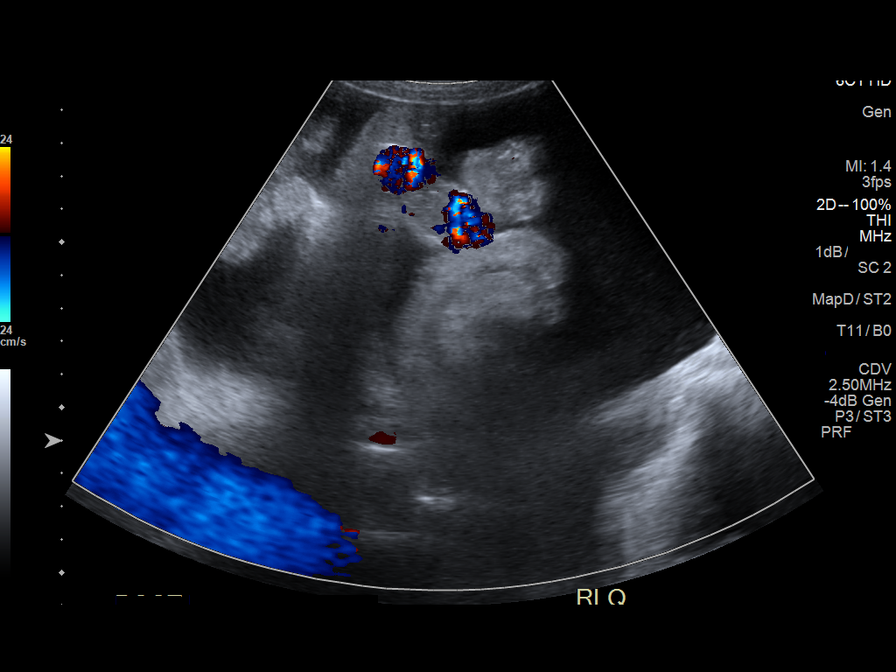

[8 of 8 positions shown; findings below may reference images not displayed]

EXAM:
ULTRASOUND GUIDED PARACENTESIS

MEDICATIONS:
None.

COMPLICATIONS:
None immediate.

PROCEDURE:
Informed written consent was obtained from the patient after a
discussion of the risks, benefits and alternatives to treatment. A
timeout was performed prior to the initiation of the procedure.

Initial ultrasound was performed to localize ascites. The right
lower abdomen was prepped and draped in the usual sterile fashion.
1% lidocaine was used for local anesthesia.

Following this, a 6 Fr Safe-T-Centesis catheter was introduced. An
ultrasound image was saved for documentation purposes. The
paracentesis was performed. The catheter was removed and a dressing
was applied. The patient tolerated the procedure well without
immediate post procedural complication.
FINDINGS: A total of approximately 5 L of yellowish fluid was removed.
IMPRESSION: Successful ultrasound-guided paracentesis yielding 5 liters of
peritoneal fluid.

## 2020-10-03 IMAGING — US US PARACENTESIS
1 series · 3 of 3 positions shown · non-contrast
Comparison: none

INDICATION: Ascites

[Series 1: us paracentesis · 0.26mm/px · 3 of 3 slices shown]
[im 1/3]
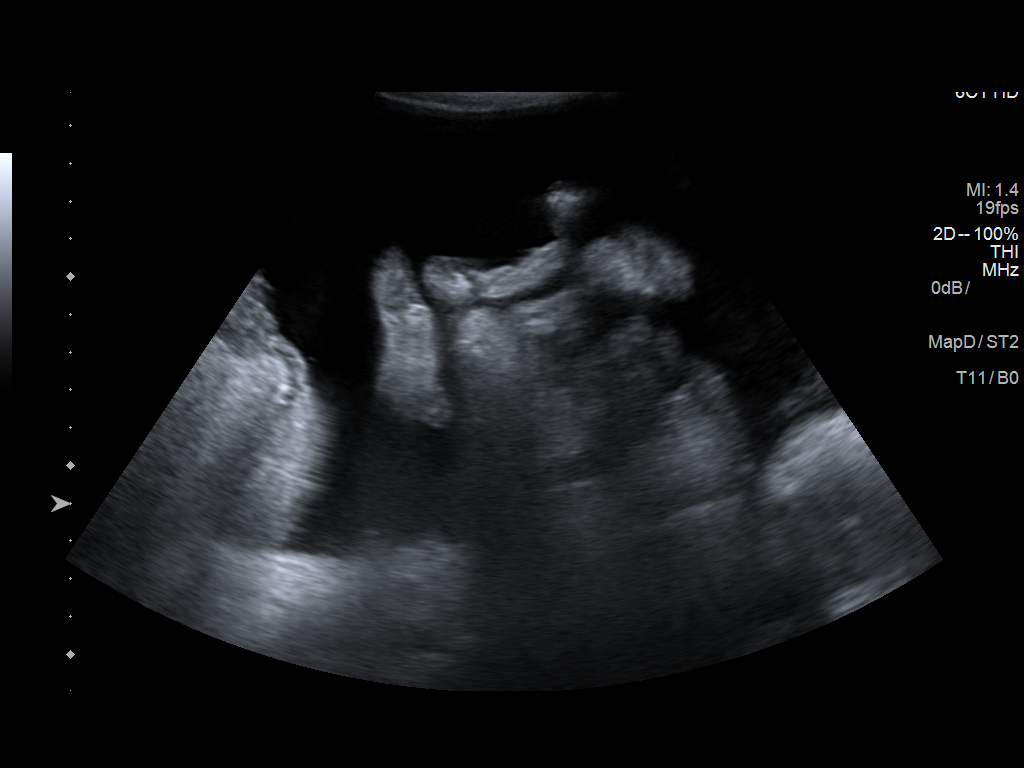
[im 2/3]
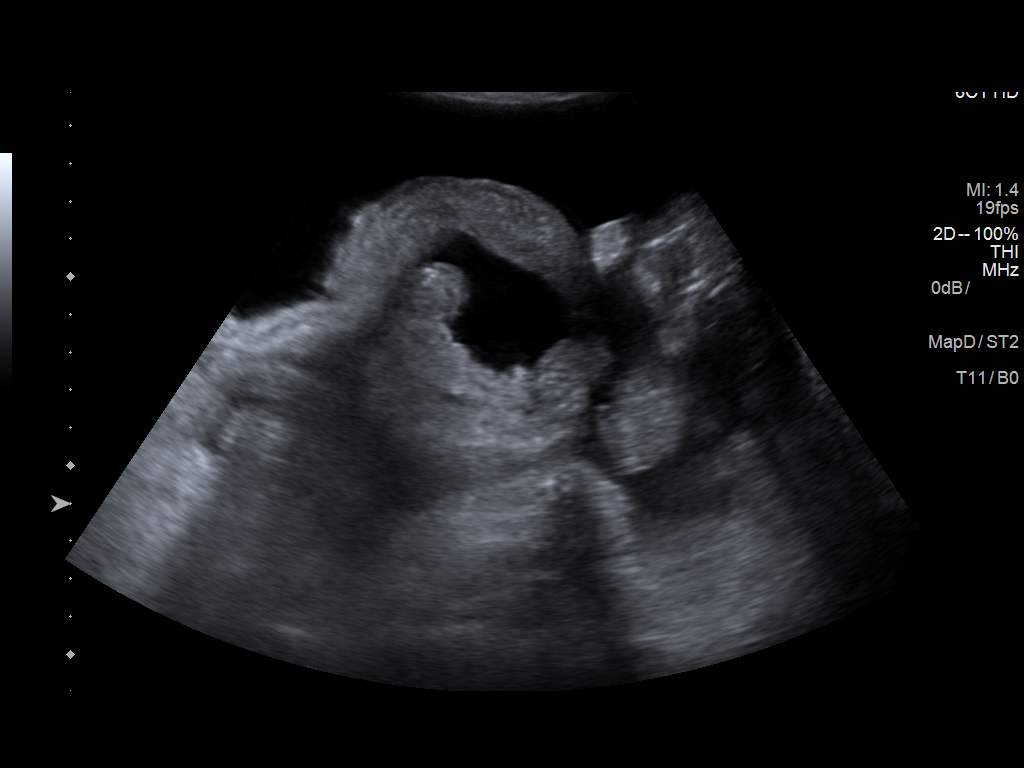
[im 3/3]
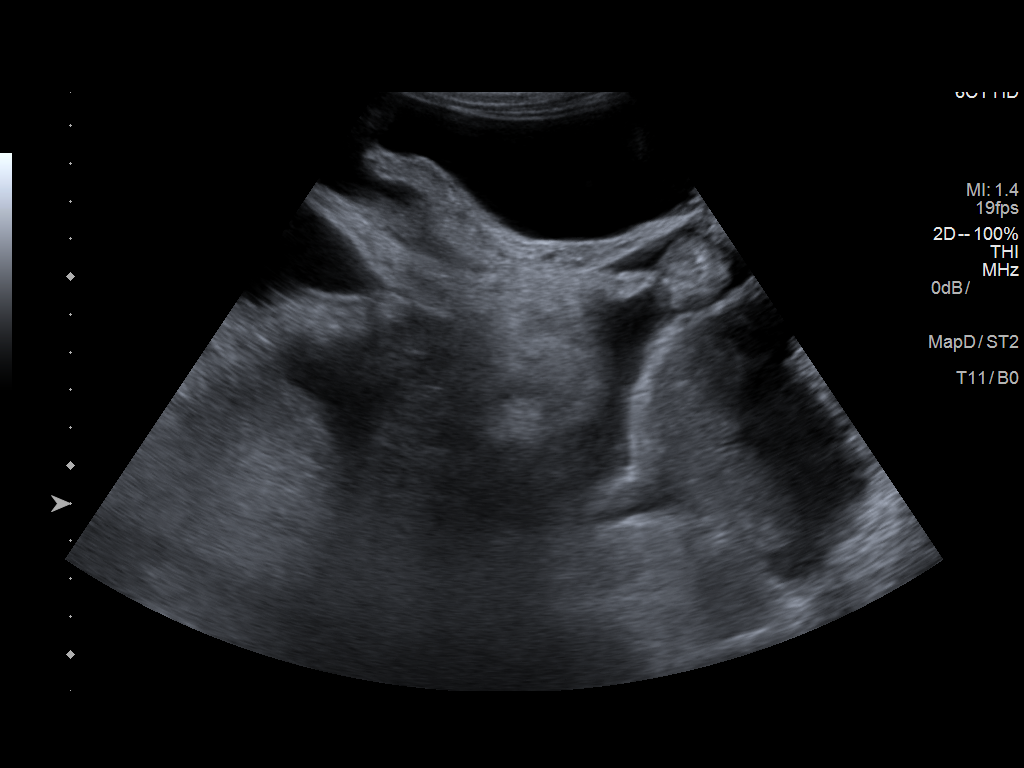

[3 of 3 positions shown; findings below may reference images not displayed]

EXAM:
ULTRASOUND GUIDED  PARACENTESIS

MEDICATIONS:
None.

COMPLICATIONS:
None immediate.

PROCEDURE:
Informed written consent was obtained from the patient after a
discussion of the risks, benefits and alternatives to treatment. A
timeout was performed prior to the initiation of the procedure.

Initial ultrasound scanning demonstrates a large amount of ascites
within the right lower abdominal quadrant. The right lower abdomen
was prepped and draped in the usual sterile fashion. 1% lidocaine
with epinephrine was used for local anesthesia.

Following this, a 6 Fr Safe-T-Centesis catheter was introduced. An
ultrasound image was saved for documentation purposes. The
paracentesis was performed. The catheter was removed and a dressing
was applied. The patient tolerated the procedure well without
immediate post procedural complication.
FINDINGS: A total of approximately 7.4 L of clear dark yellow fluid was
removed.
IMPRESSION: Successful ultrasound-guided paracentesis yielding 7.4 liters of
peritoneal fluid.
